# Patient Record
Sex: Female | Born: 1935
Health system: Southern US, Community
[De-identification: ages and names within clinical notes are randomized; demographics above are authoritative.]

## PROBLEM LIST (undated history)

## (undated) ENCOUNTER — Emergency Department (HOSPITAL_BASED_OUTPATIENT_CLINIC_OR_DEPARTMENT_OTHER): Admission: EM | Payer: Medicare Other | Source: Home / Self Care

## (undated) DIAGNOSIS — R079 Chest pain, unspecified: Secondary | ICD-10-CM

## (undated) DIAGNOSIS — C50919 Malignant neoplasm of unspecified site of unspecified female breast: Secondary | ICD-10-CM

## (undated) DIAGNOSIS — M545 Low back pain, unspecified: Secondary | ICD-10-CM

## (undated) DIAGNOSIS — I219 Acute myocardial infarction, unspecified: Secondary | ICD-10-CM

## (undated) DIAGNOSIS — I358 Other nonrheumatic aortic valve disorders: Secondary | ICD-10-CM

## (undated) DIAGNOSIS — K219 Gastro-esophageal reflux disease without esophagitis: Secondary | ICD-10-CM

## (undated) DIAGNOSIS — E119 Type 2 diabetes mellitus without complications: Secondary | ICD-10-CM

## (undated) DIAGNOSIS — K5792 Diverticulitis of intestine, part unspecified, without perforation or abscess without bleeding: Secondary | ICD-10-CM

## (undated) DIAGNOSIS — N390 Urinary tract infection, site not specified: Secondary | ICD-10-CM

## (undated) DIAGNOSIS — D649 Anemia, unspecified: Secondary | ICD-10-CM

## (undated) DIAGNOSIS — R52 Pain, unspecified: Secondary | ICD-10-CM

## (undated) DIAGNOSIS — R0600 Dyspnea, unspecified: Secondary | ICD-10-CM

## (undated) DIAGNOSIS — N189 Chronic kidney disease, unspecified: Secondary | ICD-10-CM

## (undated) DIAGNOSIS — R0602 Shortness of breath: Secondary | ICD-10-CM

## (undated) DIAGNOSIS — I350 Nonrheumatic aortic (valve) stenosis: Secondary | ICD-10-CM

## (undated) DIAGNOSIS — I2510 Atherosclerotic heart disease of native coronary artery without angina pectoris: Secondary | ICD-10-CM

## (undated) DIAGNOSIS — M1990 Unspecified osteoarthritis, unspecified site: Secondary | ICD-10-CM

## (undated) DIAGNOSIS — F419 Anxiety disorder, unspecified: Secondary | ICD-10-CM

## (undated) DIAGNOSIS — G4700 Insomnia, unspecified: Secondary | ICD-10-CM

## (undated) DIAGNOSIS — I10 Essential (primary) hypertension: Secondary | ICD-10-CM

## (undated) DIAGNOSIS — H3530 Unspecified macular degeneration: Secondary | ICD-10-CM

## (undated) DIAGNOSIS — E7800 Pure hypercholesterolemia, unspecified: Secondary | ICD-10-CM

## (undated) DIAGNOSIS — L900 Lichen sclerosus et atrophicus: Secondary | ICD-10-CM

## (undated) HISTORY — DX: Shortness of breath: R06.02

## (undated) HISTORY — DX: Essential (primary) hypertension: I10

## (undated) HISTORY — DX: Unspecified osteoarthritis, unspecified site: M19.90

## (undated) HISTORY — DX: Low back pain: M54.5

## (undated) HISTORY — PX: APPENDECTOMY: SHX54

## (undated) HISTORY — DX: Low back pain, unspecified: M54.50

## (undated) HISTORY — DX: Atherosclerotic heart disease of native coronary artery without angina pectoris: I25.10

## (undated) HISTORY — DX: Acute myocardial infarction, unspecified: I21.9

## (undated) HISTORY — DX: Nonrheumatic aortic (valve) stenosis: I35.0

## (undated) HISTORY — DX: Pain, unspecified: R52

## (undated) HISTORY — DX: Other nonrheumatic aortic valve disorders: I35.8

## (undated) HISTORY — PX: HAND SURGERY: SHX662

## (undated) HISTORY — DX: Malignant neoplasm of unspecified site of unspecified female breast: C50.919

## (undated) HISTORY — DX: Dyspnea, unspecified: R06.00

## (undated) HISTORY — DX: Pure hypercholesterolemia, unspecified: E78.00

## (undated) HISTORY — DX: Type 2 diabetes mellitus without complications: E11.9

## (undated) HISTORY — DX: Chest pain, unspecified: R07.9

## (undated) HISTORY — DX: Lichen sclerosus et atrophicus: L90.0

## (undated) HISTORY — PX: ABDOMINAL HYSTERECTOMY: SHX81

---

## 1967-12-12 HISTORY — PX: VESICOVAGINAL FISTULA CLOSURE W/ TAH: SUR271

## 1997-12-11 HISTORY — PX: MASTECTOMY: SHX3

## 1998-12-11 DIAGNOSIS — I219 Acute myocardial infarction, unspecified: Secondary | ICD-10-CM

## 1998-12-11 HISTORY — DX: Acute myocardial infarction, unspecified: I21.9

## 2001-09-02 ENCOUNTER — Emergency Department (HOSPITAL_COMMUNITY): Admission: EM | Admit: 2001-09-02 | Discharge: 2001-09-02 | Payer: Self-pay | Admitting: Emergency Medicine

## 2007-12-08 ENCOUNTER — Emergency Department (HOSPITAL_COMMUNITY): Admission: EM | Admit: 2007-12-08 | Discharge: 2007-12-09 | Payer: Self-pay | Admitting: Emergency Medicine

## 2008-01-29 ENCOUNTER — Other Ambulatory Visit: Admission: RE | Admit: 2008-01-29 | Discharge: 2008-01-29 | Payer: Self-pay | Admitting: Obstetrics and Gynecology

## 2008-09-08 ENCOUNTER — Encounter: Admission: RE | Admit: 2008-09-08 | Discharge: 2008-09-08 | Payer: Self-pay | Admitting: Internal Medicine

## 2009-04-02 ENCOUNTER — Encounter: Admission: RE | Admit: 2009-04-02 | Discharge: 2009-04-02 | Payer: Self-pay | Admitting: Internal Medicine

## 2009-05-27 ENCOUNTER — Encounter: Admission: RE | Admit: 2009-05-27 | Discharge: 2009-05-27 | Payer: Self-pay | Admitting: Orthopedic Surgery

## 2009-06-02 ENCOUNTER — Ambulatory Visit (HOSPITAL_BASED_OUTPATIENT_CLINIC_OR_DEPARTMENT_OTHER): Admission: RE | Admit: 2009-06-02 | Discharge: 2009-06-02 | Payer: Self-pay | Admitting: Orthopedic Surgery

## 2009-09-09 ENCOUNTER — Encounter: Admission: RE | Admit: 2009-09-09 | Discharge: 2009-09-09 | Payer: Self-pay | Admitting: Obstetrics and Gynecology

## 2009-11-10 HISTORY — PX: REPLACEMENT TOTAL KNEE: SUR1224

## 2009-12-07 ENCOUNTER — Inpatient Hospital Stay (HOSPITAL_COMMUNITY): Admission: RE | Admit: 2009-12-07 | Discharge: 2009-12-10 | Payer: Self-pay | Admitting: Orthopedic Surgery

## 2010-05-03 ENCOUNTER — Ambulatory Visit (HOSPITAL_COMMUNITY): Admission: RE | Admit: 2010-05-03 | Discharge: 2010-05-03 | Payer: Self-pay | Admitting: Orthopedic Surgery

## 2010-06-06 ENCOUNTER — Encounter: Admission: RE | Admit: 2010-06-06 | Discharge: 2010-06-06 | Payer: Self-pay | Admitting: Internal Medicine

## 2010-09-15 ENCOUNTER — Encounter: Admission: RE | Admit: 2010-09-15 | Discharge: 2010-09-15 | Payer: Self-pay | Admitting: Obstetrics and Gynecology

## 2010-11-04 ENCOUNTER — Encounter: Admission: RE | Admit: 2010-11-04 | Discharge: 2010-11-04 | Payer: Self-pay | Admitting: Obstetrics and Gynecology

## 2011-03-13 LAB — TYPE AND SCREEN
ABO/RH(D): O POS
Antibody Screen: NEGATIVE

## 2011-03-13 LAB — HEMOGLOBIN AND HEMATOCRIT, BLOOD
HCT: 23.4 % — ABNORMAL LOW (ref 36.0–46.0)
HCT: 27.5 % — ABNORMAL LOW (ref 36.0–46.0)
Hemoglobin: 8.6 g/dL — ABNORMAL LOW (ref 12.0–15.0)

## 2011-03-13 LAB — GLUCOSE, CAPILLARY
Glucose-Capillary: 118 mg/dL — ABNORMAL HIGH (ref 70–99)
Glucose-Capillary: 127 mg/dL — ABNORMAL HIGH (ref 70–99)
Glucose-Capillary: 149 mg/dL — ABNORMAL HIGH (ref 70–99)
Glucose-Capillary: 150 mg/dL — ABNORMAL HIGH (ref 70–99)
Glucose-Capillary: 152 mg/dL — ABNORMAL HIGH (ref 70–99)
Glucose-Capillary: 156 mg/dL — ABNORMAL HIGH (ref 70–99)
Glucose-Capillary: 158 mg/dL — ABNORMAL HIGH (ref 70–99)
Glucose-Capillary: 205 mg/dL — ABNORMAL HIGH (ref 70–99)

## 2011-03-13 LAB — PROTIME-INR
INR: 1.01 (ref 0.00–1.49)
INR: 1.07 (ref 0.00–1.49)
INR: 1.72 — ABNORMAL HIGH (ref 0.00–1.49)
Prothrombin Time: 13.2 seconds (ref 11.6–15.2)
Prothrombin Time: 20 seconds — ABNORMAL HIGH (ref 11.6–15.2)

## 2011-03-13 LAB — COMPREHENSIVE METABOLIC PANEL
Albumin: 4.4 g/dL (ref 3.5–5.2)
CO2: 28 mEq/L (ref 19–32)
Calcium: 10.2 mg/dL (ref 8.4–10.5)
Glucose, Bld: 109 mg/dL — ABNORMAL HIGH (ref 70–99)
Sodium: 137 mEq/L (ref 135–145)
Total Protein: 7.6 g/dL (ref 6.0–8.3)

## 2011-03-13 LAB — URINALYSIS, ROUTINE W REFLEX MICROSCOPIC
Bilirubin Urine: NEGATIVE
Ketones, ur: NEGATIVE mg/dL
Nitrite: NEGATIVE
Specific Gravity, Urine: 1.022 (ref 1.005–1.030)
Urobilinogen, UA: 0.2 mg/dL (ref 0.0–1.0)
pH: 7 (ref 5.0–8.0)

## 2011-03-13 LAB — DIFFERENTIAL
Basophils Relative: 0 % (ref 0–1)
Eosinophils Absolute: 0.2 10*3/uL (ref 0.0–0.7)
Monocytes Absolute: 0.5 10*3/uL (ref 0.1–1.0)
Neutrophils Relative %: 60 % (ref 43–77)

## 2011-03-13 LAB — CBC
Hemoglobin: 14.3 g/dL (ref 12.0–15.0)
MCV: 92.9 fL (ref 78.0–100.0)
Platelets: 248 10*3/uL (ref 150–400)
RDW: 12.7 % (ref 11.5–15.5)
WBC: 7.7 10*3/uL (ref 4.0–10.5)

## 2011-03-13 LAB — PREPARE RBC (CROSSMATCH)

## 2011-03-20 LAB — BASIC METABOLIC PANEL
GFR calc non Af Amer: >60 mL/min (ref >60)
Potassium: 4.5 mEq/L (ref 3.5–5.1)
Sodium: 141 mEq/L (ref 135–145)

## 2011-03-20 LAB — GLUCOSE, CAPILLARY: Glucose-Capillary: 106 mg/dL — ABNORMAL HIGH (ref 70–99)

## 2011-03-20 LAB — POCT HEMOGLOBIN-HEMACUE: Hemoglobin: 13.2 g/dL (ref 12.0–15.0)

## 2011-04-25 NOTE — Op Note (Signed)
Barbara Russell, Barbara Russell              ACCOUNT NO.:  [id number]   MEDICAL RECORD NO.:  [id number]          PATIENT TYPE:  AMB   LOCATION:  [facility]                         FACILITY:  [facility]   PHYSICIAN:  Georges Lynch. Gioffre, M.D.DATE OF BIRTH:  Feb 24, 1930   DATE OF PROCEDURE:  06/02/2009  DATE OF DISCHARGE:                               OPERATIVE REPORT   SURGEON:  Georges Lynch. Darrelyn Hillock, M.D.   ASSISTANT:  Nurse.   PREOPERATIVE DIAGNOSES:  1. Severe degenerative arthritis with a valgus deformity of the right      knee.  2. Torn medial meniscus, right knee.  3. Torn lateral meniscus, right knee.   POSTOPERATIVE DIAGNOSES:  1. Severe degenerative arthritis with a valgus deformity of the right      knee.  2. Torn medial meniscus, right knee.  3. Torn lateral meniscus, right knee.   OPERATION:  1. Diagnostic arthroscopy, right knee.  2. Medial meniscectomy, right knee.  3. Lateral meniscectomy, right knee.  4. Abrasion chondroplasty medial femoral condyle, right knee.  5. Synovectomy suprapatellar pouch, right knee.   PROCEDURE:  Under general anesthesia a routine orthopedic prep and  draping of the right lower extremity was carried out.  She had 500 mg of  vancomycin IV.  At this time a small punctate incision was made in the  suprapatellar pouch, inflow cannula was inserted, knee was distended  with saline.  Another small punctate incision was made in the  anterolateral joint.  The arthroscope was entered from lateral approach  and a complete diagnostic arthroscopy was carried out.  Following that,  I went up into the suprapatellar pouch.  She had a severe chronic  synovitis.  I introduced the ArthroCare and did a synovectomy.  I then  went down in the lateral joint.  She had severe arthritic changes in the  lateral joint plus a severe complex tear of the entire lateral meniscus.  I did a partial lateral meniscectomy at this time.  Cruciates were  intact and over the medial joint did an  abrasion chondroplasty with the  shaver suction device to the medial femoral condyle.  She also had a  complex tear of the medial meniscus.  I introduced a shaver suction  device followed by the ArthroCare and did a partial medial meniscectomy.  I thoroughly irrigated out the knee, reinspected the knee.  There were  no other abnormalities noted.  The bottom line is she had severe  arthritic changes in the right knee.  She may need a total knee  arthroplasty in the future.  Note she had 500 mg of vancomycin IV.  Postop I injected 30 mL 0.5% Marcaine and epinephrine into the knee  joint.  A sterile Neosporin bundle dressing was applied.   POSTOP CARE:  1. She is going to be on aspirin 325 mg b.i.d. starting today and for      2 weeks.  2. She will be on Darvocet N 100 one every 4 hours p.r.n. for moderate      pain.  3. She will be on Dilaudid 2 mg q.6 h p.r.n. for severe  pain.  4. She will remove all her dressing as I explained Friday morning and      apply Band-Aids the each of the areas.  5. She will ambulate with a walker full weightbearing.  6. She will call the office and let me see her in 12-14 days or prior      to that if there is a problem.           ______________________________  Georges Lynch Darrelyn Hillock, M.D.     RAG/MEDQ  D:  06/02/2009  T:  06/02/2009  Job:  [id number]

## 2011-07-13 ENCOUNTER — Other Ambulatory Visit: Payer: Self-pay | Admitting: Obstetrics and Gynecology

## 2011-07-13 DIAGNOSIS — Z9011 Acquired absence of right breast and nipple: Secondary | ICD-10-CM

## 2011-09-18 ENCOUNTER — Ambulatory Visit
Admission: RE | Admit: 2011-09-18 | Discharge: 2011-09-18 | Disposition: A | Discharge status: Home / Self Care | Payer: Self-pay | Source: Ambulatory Visit | Attending: Obstetrics and Gynecology | Admitting: Obstetrics and Gynecology

## 2011-09-18 DIAGNOSIS — Z9011 Acquired absence of right breast and nipple: Secondary | ICD-10-CM

## 2011-12-13 DIAGNOSIS — I10 Essential (primary) hypertension: Secondary | ICD-10-CM | POA: Diagnosis not present

## 2011-12-13 DIAGNOSIS — E785 Hyperlipidemia, unspecified: Secondary | ICD-10-CM | POA: Diagnosis not present

## 2011-12-21 DIAGNOSIS — I359 Nonrheumatic aortic valve disorder, unspecified: Secondary | ICD-10-CM | POA: Diagnosis not present

## 2011-12-21 DIAGNOSIS — R0602 Shortness of breath: Secondary | ICD-10-CM | POA: Diagnosis not present

## 2011-12-21 DIAGNOSIS — R079 Chest pain, unspecified: Secondary | ICD-10-CM | POA: Diagnosis not present

## 2011-12-27 DIAGNOSIS — R079 Chest pain, unspecified: Secondary | ICD-10-CM | POA: Diagnosis not present

## 2011-12-27 DIAGNOSIS — R0602 Shortness of breath: Secondary | ICD-10-CM | POA: Diagnosis not present

## 2011-12-27 DIAGNOSIS — I359 Nonrheumatic aortic valve disorder, unspecified: Secondary | ICD-10-CM | POA: Diagnosis not present

## 2012-01-09 DIAGNOSIS — M1710 Unilateral primary osteoarthritis, unspecified knee: Secondary | ICD-10-CM | POA: Diagnosis not present

## 2012-01-12 DIAGNOSIS — M1710 Unilateral primary osteoarthritis, unspecified knee: Secondary | ICD-10-CM | POA: Diagnosis not present

## 2012-01-30 DIAGNOSIS — R350 Frequency of micturition: Secondary | ICD-10-CM | POA: Diagnosis not present

## 2012-01-30 DIAGNOSIS — R0602 Shortness of breath: Secondary | ICD-10-CM | POA: Diagnosis not present

## 2012-02-13 DIAGNOSIS — I10 Essential (primary) hypertension: Secondary | ICD-10-CM | POA: Diagnosis not present

## 2012-02-13 DIAGNOSIS — R0602 Shortness of breath: Secondary | ICD-10-CM | POA: Diagnosis not present

## 2012-04-22 DIAGNOSIS — M6530 Trigger finger, unspecified finger: Secondary | ICD-10-CM | POA: Diagnosis not present

## 2012-05-01 DIAGNOSIS — M6530 Trigger finger, unspecified finger: Secondary | ICD-10-CM | POA: Diagnosis not present

## 2012-05-03 DIAGNOSIS — M6530 Trigger finger, unspecified finger: Secondary | ICD-10-CM | POA: Diagnosis not present

## 2012-05-16 DIAGNOSIS — E785 Hyperlipidemia, unspecified: Secondary | ICD-10-CM | POA: Diagnosis not present

## 2012-05-16 DIAGNOSIS — E119 Type 2 diabetes mellitus without complications: Secondary | ICD-10-CM | POA: Diagnosis not present

## 2012-05-16 DIAGNOSIS — I2510 Atherosclerotic heart disease of native coronary artery without angina pectoris: Secondary | ICD-10-CM | POA: Diagnosis not present

## 2012-05-16 DIAGNOSIS — N390 Urinary tract infection, site not specified: Secondary | ICD-10-CM | POA: Diagnosis not present

## 2012-05-16 DIAGNOSIS — M899 Disorder of bone, unspecified: Secondary | ICD-10-CM | POA: Diagnosis not present

## 2012-05-16 DIAGNOSIS — I519 Heart disease, unspecified: Secondary | ICD-10-CM | POA: Diagnosis not present

## 2012-05-16 DIAGNOSIS — Z0000 Encounter for general adult medical examination without abnormal findings: Secondary | ICD-10-CM | POA: Diagnosis not present

## 2012-05-16 DIAGNOSIS — I10 Essential (primary) hypertension: Secondary | ICD-10-CM | POA: Diagnosis not present

## 2012-07-18 DIAGNOSIS — R197 Diarrhea, unspecified: Secondary | ICD-10-CM | POA: Diagnosis not present

## 2012-07-18 DIAGNOSIS — R05 Cough: Secondary | ICD-10-CM | POA: Diagnosis not present

## 2012-07-18 DIAGNOSIS — K219 Gastro-esophageal reflux disease without esophagitis: Secondary | ICD-10-CM | POA: Diagnosis not present

## 2012-07-18 DIAGNOSIS — R109 Unspecified abdominal pain: Secondary | ICD-10-CM | POA: Diagnosis not present

## 2012-08-17 DIAGNOSIS — M6580 Other synovitis and tenosynovitis, unspecified site: Secondary | ICD-10-CM | POA: Diagnosis not present

## 2012-09-10 ENCOUNTER — Other Ambulatory Visit: Payer: Self-pay | Admitting: Obstetrics and Gynecology

## 2012-09-10 DIAGNOSIS — Z9011 Acquired absence of right breast and nipple: Secondary | ICD-10-CM

## 2012-09-10 DIAGNOSIS — Z1231 Encounter for screening mammogram for malignant neoplasm of breast: Secondary | ICD-10-CM

## 2012-09-30 DIAGNOSIS — Z23 Encounter for immunization: Secondary | ICD-10-CM | POA: Diagnosis not present

## 2012-10-15 ENCOUNTER — Ambulatory Visit
Admission: RE | Admit: 2012-10-15 | Discharge: 2012-10-15 | Disposition: A | Discharge status: Home / Self Care | Payer: Federal, State, Local not specified - PPO | Source: Ambulatory Visit | Attending: Obstetrics and Gynecology | Admitting: Obstetrics and Gynecology

## 2012-10-15 DIAGNOSIS — Z1231 Encounter for screening mammogram for malignant neoplasm of breast: Secondary | ICD-10-CM | POA: Diagnosis not present

## 2012-10-15 DIAGNOSIS — Z9011 Acquired absence of right breast and nipple: Secondary | ICD-10-CM

## 2012-11-13 DIAGNOSIS — N182 Chronic kidney disease, stage 2 (mild): Secondary | ICD-10-CM | POA: Diagnosis not present

## 2012-11-13 DIAGNOSIS — I10 Essential (primary) hypertension: Secondary | ICD-10-CM | POA: Diagnosis not present

## 2012-11-13 DIAGNOSIS — E1129 Type 2 diabetes mellitus with other diabetic kidney complication: Secondary | ICD-10-CM | POA: Diagnosis not present

## 2012-12-02 DIAGNOSIS — R1032 Left lower quadrant pain: Secondary | ICD-10-CM | POA: Diagnosis not present

## 2012-12-02 DIAGNOSIS — Z01419 Encounter for gynecological examination (general) (routine) without abnormal findings: Secondary | ICD-10-CM | POA: Diagnosis not present

## 2013-04-01 DIAGNOSIS — R197 Diarrhea, unspecified: Secondary | ICD-10-CM | POA: Diagnosis not present

## 2013-04-01 DIAGNOSIS — R141 Gas pain: Secondary | ICD-10-CM | POA: Diagnosis not present

## 2013-04-09 DIAGNOSIS — H26499 Other secondary cataract, unspecified eye: Secondary | ICD-10-CM | POA: Diagnosis not present

## 2013-04-09 DIAGNOSIS — H04129 Dry eye syndrome of unspecified lacrimal gland: Secondary | ICD-10-CM | POA: Diagnosis not present

## 2013-04-09 DIAGNOSIS — E119 Type 2 diabetes mellitus without complications: Secondary | ICD-10-CM | POA: Diagnosis not present

## 2013-04-10 DIAGNOSIS — I10 Essential (primary) hypertension: Secondary | ICD-10-CM | POA: Diagnosis not present

## 2013-04-10 DIAGNOSIS — R197 Diarrhea, unspecified: Secondary | ICD-10-CM | POA: Diagnosis not present

## 2013-04-10 DIAGNOSIS — R259 Unspecified abnormal involuntary movements: Secondary | ICD-10-CM | POA: Diagnosis not present

## 2013-05-21 DIAGNOSIS — Z0000 Encounter for general adult medical examination without abnormal findings: Secondary | ICD-10-CM | POA: Diagnosis not present

## 2013-05-21 DIAGNOSIS — N390 Urinary tract infection, site not specified: Secondary | ICD-10-CM | POA: Diagnosis not present

## 2013-05-21 DIAGNOSIS — M899 Disorder of bone, unspecified: Secondary | ICD-10-CM | POA: Diagnosis not present

## 2013-05-21 DIAGNOSIS — E119 Type 2 diabetes mellitus without complications: Secondary | ICD-10-CM | POA: Diagnosis not present

## 2013-05-21 DIAGNOSIS — I10 Essential (primary) hypertension: Secondary | ICD-10-CM | POA: Diagnosis not present

## 2013-05-21 DIAGNOSIS — E785 Hyperlipidemia, unspecified: Secondary | ICD-10-CM | POA: Diagnosis not present

## 2013-05-21 DIAGNOSIS — I519 Heart disease, unspecified: Secondary | ICD-10-CM | POA: Diagnosis not present

## 2013-05-28 DIAGNOSIS — N390 Urinary tract infection, site not specified: Secondary | ICD-10-CM | POA: Diagnosis not present

## 2013-06-24 DIAGNOSIS — I2510 Atherosclerotic heart disease of native coronary artery without angina pectoris: Secondary | ICD-10-CM | POA: Diagnosis not present

## 2013-06-24 DIAGNOSIS — R079 Chest pain, unspecified: Secondary | ICD-10-CM | POA: Diagnosis not present

## 2013-06-26 DIAGNOSIS — I10 Essential (primary) hypertension: Secondary | ICD-10-CM | POA: Diagnosis not present

## 2013-06-26 DIAGNOSIS — R079 Chest pain, unspecified: Secondary | ICD-10-CM | POA: Diagnosis not present

## 2013-06-26 DIAGNOSIS — E785 Hyperlipidemia, unspecified: Secondary | ICD-10-CM | POA: Diagnosis not present

## 2013-06-26 DIAGNOSIS — I2510 Atherosclerotic heart disease of native coronary artery without angina pectoris: Secondary | ICD-10-CM | POA: Diagnosis not present

## 2013-07-03 DIAGNOSIS — R079 Chest pain, unspecified: Secondary | ICD-10-CM | POA: Diagnosis not present

## 2013-07-03 DIAGNOSIS — I10 Essential (primary) hypertension: Secondary | ICD-10-CM | POA: Diagnosis not present

## 2013-07-03 DIAGNOSIS — E785 Hyperlipidemia, unspecified: Secondary | ICD-10-CM | POA: Diagnosis not present

## 2013-07-03 DIAGNOSIS — I2510 Atherosclerotic heart disease of native coronary artery without angina pectoris: Secondary | ICD-10-CM | POA: Diagnosis not present

## 2013-07-11 DIAGNOSIS — M79609 Pain in unspecified limb: Secondary | ICD-10-CM | POA: Diagnosis not present

## 2013-08-18 ENCOUNTER — Other Ambulatory Visit: Payer: Self-pay | Admitting: Internal Medicine

## 2013-08-18 DIAGNOSIS — R0602 Shortness of breath: Secondary | ICD-10-CM | POA: Diagnosis not present

## 2013-08-18 DIAGNOSIS — R1310 Dysphagia, unspecified: Secondary | ICD-10-CM | POA: Diagnosis not present

## 2013-08-28 ENCOUNTER — Ambulatory Visit
Admission: RE | Admit: 2013-08-28 | Discharge: 2013-08-28 | Disposition: A | Discharge status: Home / Self Care | Payer: Medicare Other | Source: Ambulatory Visit | Attending: Internal Medicine | Admitting: Internal Medicine

## 2013-08-28 DIAGNOSIS — R1310 Dysphagia, unspecified: Secondary | ICD-10-CM

## 2013-08-28 DIAGNOSIS — K228 Other specified diseases of esophagus: Secondary | ICD-10-CM | POA: Diagnosis not present

## 2013-09-07 DIAGNOSIS — Z23 Encounter for immunization: Secondary | ICD-10-CM | POA: Diagnosis not present

## 2013-10-09 ENCOUNTER — Other Ambulatory Visit: Payer: Self-pay | Admitting: Nurse Practitioner

## 2013-10-09 ENCOUNTER — Ambulatory Visit
Admission: RE | Admit: 2013-10-09 | Discharge: 2013-10-09 | Disposition: A | Discharge status: Home / Self Care | Payer: Medicare Other | Source: Ambulatory Visit | Attending: Nurse Practitioner | Admitting: Nurse Practitioner

## 2013-10-09 DIAGNOSIS — R509 Fever, unspecified: Secondary | ICD-10-CM

## 2013-10-09 DIAGNOSIS — J189 Pneumonia, unspecified organism: Secondary | ICD-10-CM | POA: Diagnosis not present

## 2013-10-09 DIAGNOSIS — J398 Other specified diseases of upper respiratory tract: Secondary | ICD-10-CM | POA: Diagnosis not present

## 2013-10-13 ENCOUNTER — Encounter: Payer: Self-pay | Admitting: Cardiology

## 2013-10-17 DIAGNOSIS — J189 Pneumonia, unspecified organism: Secondary | ICD-10-CM | POA: Diagnosis not present

## 2013-10-17 DIAGNOSIS — R509 Fever, unspecified: Secondary | ICD-10-CM | POA: Diagnosis not present

## 2013-10-20 ENCOUNTER — Other Ambulatory Visit: Payer: Self-pay

## 2013-10-20 DIAGNOSIS — Z1231 Encounter for screening mammogram for malignant neoplasm of breast: Secondary | ICD-10-CM

## 2013-10-20 DIAGNOSIS — Z9011 Acquired absence of right breast and nipple: Secondary | ICD-10-CM

## 2013-11-26 ENCOUNTER — Ambulatory Visit: Payer: Medicare Other

## 2013-12-02 DIAGNOSIS — N390 Urinary tract infection, site not specified: Secondary | ICD-10-CM | POA: Diagnosis not present

## 2013-12-16 DIAGNOSIS — N390 Urinary tract infection, site not specified: Secondary | ICD-10-CM | POA: Diagnosis not present

## 2013-12-18 DIAGNOSIS — M949 Disorder of cartilage, unspecified: Secondary | ICD-10-CM | POA: Diagnosis not present

## 2013-12-18 DIAGNOSIS — M899 Disorder of bone, unspecified: Secondary | ICD-10-CM | POA: Diagnosis not present

## 2013-12-29 ENCOUNTER — Ambulatory Visit: Payer: Medicare Other

## 2013-12-29 ENCOUNTER — Ambulatory Visit: Payer: Medicare Other | Admitting: Cardiology

## 2014-01-01 ENCOUNTER — Other Ambulatory Visit: Payer: Self-pay

## 2014-01-01 DIAGNOSIS — Z1231 Encounter for screening mammogram for malignant neoplasm of breast: Secondary | ICD-10-CM

## 2014-01-01 DIAGNOSIS — Z9011 Acquired absence of right breast and nipple: Secondary | ICD-10-CM

## 2014-01-06 ENCOUNTER — Ambulatory Visit (INDEPENDENT_AMBULATORY_CARE_PROVIDER_SITE_OTHER): Payer: Medicare Other | Admitting: Cardiology

## 2014-01-06 ENCOUNTER — Encounter: Payer: Self-pay | Admitting: Cardiology

## 2014-01-06 VITALS — BP 120/62 | HR 75 | Ht 63.0 in | Wt 146.0 lb

## 2014-01-06 DIAGNOSIS — I700 Atherosclerosis of aorta: Secondary | ICD-10-CM

## 2014-01-06 DIAGNOSIS — IMO0001 Reserved for inherently not codable concepts without codable children: Secondary | ICD-10-CM

## 2014-01-06 DIAGNOSIS — E119 Type 2 diabetes mellitus without complications: Secondary | ICD-10-CM | POA: Diagnosis not present

## 2014-01-06 DIAGNOSIS — I059 Rheumatic mitral valve disease, unspecified: Secondary | ICD-10-CM

## 2014-01-06 DIAGNOSIS — I208 Other forms of angina pectoris: Secondary | ICD-10-CM

## 2014-01-06 DIAGNOSIS — I252 Old myocardial infarction: Secondary | ICD-10-CM

## 2014-01-06 DIAGNOSIS — I2089 Other forms of angina pectoris: Secondary | ICD-10-CM

## 2014-01-06 DIAGNOSIS — I209 Angina pectoris, unspecified: Secondary | ICD-10-CM | POA: Diagnosis not present

## 2014-01-06 DIAGNOSIS — R011 Cardiac murmur, unspecified: Secondary | ICD-10-CM | POA: Diagnosis not present

## 2014-01-06 DIAGNOSIS — I2510 Atherosclerotic heart disease of native coronary artery without angina pectoris: Secondary | ICD-10-CM

## 2014-01-06 DIAGNOSIS — I340 Nonrheumatic mitral (valve) insufficiency: Secondary | ICD-10-CM

## 2014-01-06 MED ORDER — ISOSORBIDE MONONITRATE ER 30 MG PO TB24: 30.0000 mg | ORAL_TABLET | Freq: Every day | ORAL | Status: DC

## 2014-01-06 NOTE — Patient Instructions (Addendum)
Your physician has recommended you make the following change in your medication:   1. Start Imdur 30 mg once daily.  Your physician has requested that you have an echocardiogram. Echocardiography is a painless test that uses sound waves to create images of your heart. It provides your doctor with information about the size and shape of your heart and how well your heart's chambers and valves are working. This procedure takes approximately one hour. There are no restrictions for this procedure.  Your physician recommends that you schedule a follow-up appointment in: 3 months with Dr. Marlou Porch.

## 2014-01-06 NOTE — Progress Notes (Signed)
[place]. [address] [place], [place]  [address] Phone: [phone] Fax:  [phone]  Date:  01/06/2014   ID:  Barbara Russell, DOB [birth year], MRN [id number]  PCP:  Kandice Hams, MD   History of Present Illness: Barbara Russell is a 80 y.o. female who has a history of hypertension, diabetes, normal stress test in 06/2013 low risk with remote history of CAD documented in the past with myocardial infarction in the year 2000. She has been intolerant of statins. I saw her last on 12/21/11 secondary to mostly dyspnea. She also had a several year history of pain/shortness of breath radiating to her neck that was described as moderate severity. At that time she did not wish to take Lasix. She was worried about worsening leg cramping. An echocardiogram demonstrated normal ejection fraction with mild LVH and mild diastolic dysfunction. I performed a nuclear stress test at that time which was overall low risk with no evidence of ischemia  Diarrhea for years, lactose intolerant. Main complaint. No hiatal hernia.  Saw my mother at wedding in [place].   If goes up hill has pain that goes up to neck.Chasing dog feels same thing. Knitting same thing.   Wt Readings from Last 3 Encounters:  01/06/14 146 lb (66.225 kg)     Past Medical History  Diagnosis Date  . Hypertension   . Hypercholesteremia   . Diabetes mellitus without complication   . Coronary artery disease     Remote Hx of CAD, normal stress test 2009  . Breast cancer     Hx of BC, s/p mastectomy on right 1999, s/p tamoxifen X1 year  . Lichen sclerosus   . Osteoarthrosis, unspecified whether generalized or localized, unspecified site   . Low back pain   . Myocardial infarction 2000  . SOB (shortness of breath)   . Pain     Several year Hx of pain/ SOB radiating to her neck  . Chest pain     EC Stress test 12/21/11 Low risk, no ischemia, normal EF  . Aortic heart murmur     aortic sclerosis w/out and significant  stenosis  . Dyspnea     Past Surgical History  Procedure Laterality Date  . Mastectomy Right 1999  . Replacement total knee Right 11/2009  . Vesicovaginal fistula closure w/ tah  1969  . Appendectomy    . Hand surgery Bilateral     CTS and trigger finger on right and left    Current Outpatient Prescriptions  Medication Sig Dispense Refill  . aspirin 81 MG tablet Take 81 mg by mouth daily.      . cetirizine (ZYRTEC) 10 MG tablet Take 10 mg by mouth daily.      . cholecalciferol (VITAMIN D) 1000 UNITS tablet Take 2,000 Units by mouth daily.       . famotidine (PEPCID) 10 MG tablet Take 10 mg by mouth as directed.      . furosemide (LASIX) 20 MG tablet Take 10 mg by mouth daily.      Marland Kitchen gabapentin (NEURONTIN) 300 MG capsule Take 300 mg by mouth 2 (two) times daily.      Marland Kitchen loperamide (IMODIUM) 2 MG capsule Take 2 mg by mouth as needed for diarrhea or loose stools.      . metFORMIN (GLUCOPHAGE) 500 MG tablet Take 500 mg by mouth 2 (two) times daily with a meal.       . nitroGLYCERIN (NITROSTAT) 0.4 MG  SL tablet Place 0.4 mg under the tongue every 5 (five) minutes as needed for chest pain.      Marland Kitchen olmesartan (BENICAR) 20 MG tablet Take 20 mg by mouth daily.      . ondansetron (ZOFRAN) 4 MG tablet Take 4 mg by mouth every 8 (eight) hours. As directed      . Potassium (POTASSIMIN PO) Take 1 tablet by mouth at bedtime.      Marland Kitchen zolpidem (AMBIEN) 5 MG tablet Take 2.5 mg by mouth at bedtime.       No current facility-administered medications for this visit.    Allergies:    Allergies  Allergen Reactions  . Benadryl [Diphenhydramine Hcl (Sleep)]   . Ciprocinonide [Fluocinolone]     CIPRO  . Lactose Intolerance (Gi)     DIARRHEA  . Macrobid [employer] Macro]   . Macrodantin [Nitrofurantoin Macrocrystal]   . Naproxen   . Penicillins   . Prednisone   . Quinine Sulfate [Quinine]   . Sulfa Antibiotics     Social History:  The patient  reports that she has never smoked. She  does not have any smokeless tobacco history on file. She reports that she does not drink alcohol or use illicit drugs.   ROS:  Please see the history of present illness.   Denies any fevers, chills, orthopnea, PND    PHYSICAL EXAM: VS:  BP 120/62  Pulse 75  Ht 5\' 3"  (1.6 m)  Wt 146 lb (66.225 kg)  BMI 25.87 kg/m2 Well nourished, well developed, in no acute distress HEENT: normal Neck: no JVD  Cardiac:  normal S1, S2; RRR; 2/6 S murmurRUSB Lungs:  clear to auscultation bilaterally, no wheezing, rhonchi or rales Abd: soft, nontender, no hepatomegaly Ext: no edema Skin: warm and dry Neuro: no focal abnormalities noted  EKG:  Sinus rhythm rate 75 with old septal infarct pattern, poor R wave progression      ASSESSMENT AND PLAN:  1. Old myocardial infarction-year 2000. She's having occasional symptom of chest discomfort with radiation to behind the ears that sometimes occurs when chasing a dog, moving the trash can but has happened at rest. Previous stress tests have been reassuring.. Last stress test in 06/2013 was reassuring. Low risk. I will try isosorbide 30 mg daily to see if this helps. Discussed possible side effect of headache. 2. Angina-as described above. Could be GI related. 3. Coronary artery disease- remote history in past. Documented in medical record. Stable. 4. Diabetes-per primary. 5. Heart murmur-prior mitral annular calcification, mitral regurgitation and aortic valve sclerosis but no stenosis in 2012. With her increasing symptoms, I will check an echocardiogram once again to ensure that she does not have any advancement of her aortic valve disorder. 6. We'll see back in 3 months.  Signed, Candee Furbish, MD [facility]  01/06/2014 10:37 AM

## 2014-01-08 DIAGNOSIS — Z124 Encounter for screening for malignant neoplasm of cervix: Secondary | ICD-10-CM | POA: Diagnosis not present

## 2014-01-09 ENCOUNTER — Other Ambulatory Visit: Payer: Self-pay | Admitting: Obstetrics and Gynecology

## 2014-01-09 ENCOUNTER — Other Ambulatory Visit: Payer: Self-pay

## 2014-01-09 DIAGNOSIS — Z9011 Acquired absence of right breast and nipple: Secondary | ICD-10-CM

## 2014-01-09 DIAGNOSIS — N644 Mastodynia: Secondary | ICD-10-CM

## 2014-01-12 ENCOUNTER — Ambulatory Visit: Payer: Medicare Other

## 2014-01-14 ENCOUNTER — Other Ambulatory Visit: Payer: Medicare Other

## 2014-01-20 ENCOUNTER — Ambulatory Visit
Admission: RE | Admit: 2014-01-20 | Discharge: 2014-01-20 | Disposition: A | Discharge status: Home / Self Care | Payer: Medicare Other | Source: Ambulatory Visit | Attending: Obstetrics and Gynecology | Admitting: Obstetrics and Gynecology

## 2014-01-20 DIAGNOSIS — Z9011 Acquired absence of right breast and nipple: Secondary | ICD-10-CM

## 2014-01-20 DIAGNOSIS — N644 Mastodynia: Secondary | ICD-10-CM

## 2014-01-20 DIAGNOSIS — R928 Other abnormal and inconclusive findings on diagnostic imaging of breast: Secondary | ICD-10-CM | POA: Diagnosis not present

## 2014-01-20 DIAGNOSIS — N6089 Other benign mammary dysplasias of unspecified breast: Secondary | ICD-10-CM | POA: Diagnosis not present

## 2014-01-22 ENCOUNTER — Ambulatory Visit (HOSPITAL_COMMUNITY): Payer: Medicare Other | Attending: Cardiology | Admitting: Radiology

## 2014-01-22 DIAGNOSIS — I059 Rheumatic mitral valve disease, unspecified: Secondary | ICD-10-CM | POA: Insufficient documentation

## 2014-01-22 DIAGNOSIS — I359 Nonrheumatic aortic valve disorder, unspecified: Secondary | ICD-10-CM | POA: Diagnosis not present

## 2014-01-22 DIAGNOSIS — R011 Cardiac murmur, unspecified: Secondary | ICD-10-CM | POA: Diagnosis not present

## 2014-01-22 NOTE — Progress Notes (Signed)
Echocardiogram performed.  

## 2014-02-25 DIAGNOSIS — R1032 Left lower quadrant pain: Secondary | ICD-10-CM | POA: Diagnosis not present

## 2014-03-05 DIAGNOSIS — R197 Diarrhea, unspecified: Secondary | ICD-10-CM | POA: Diagnosis not present

## 2014-03-05 DIAGNOSIS — R1032 Left lower quadrant pain: Secondary | ICD-10-CM | POA: Diagnosis not present

## 2014-03-12 DIAGNOSIS — R259 Unspecified abnormal involuntary movements: Secondary | ICD-10-CM | POA: Diagnosis not present

## 2014-03-12 DIAGNOSIS — R109 Unspecified abdominal pain: Secondary | ICD-10-CM | POA: Diagnosis not present

## 2014-04-07 ENCOUNTER — Ambulatory Visit (INDEPENDENT_AMBULATORY_CARE_PROVIDER_SITE_OTHER): Payer: Medicare Other | Admitting: Cardiology

## 2014-04-07 ENCOUNTER — Encounter: Payer: Self-pay | Admitting: Cardiology

## 2014-04-07 VITALS — BP 121/59 | HR 71 | Ht 68.0 in | Wt 143.0 lb

## 2014-04-07 DIAGNOSIS — I252 Old myocardial infarction: Secondary | ICD-10-CM | POA: Diagnosis not present

## 2014-04-07 DIAGNOSIS — I2089 Other forms of angina pectoris: Secondary | ICD-10-CM

## 2014-04-07 DIAGNOSIS — E119 Type 2 diabetes mellitus without complications: Secondary | ICD-10-CM | POA: Diagnosis not present

## 2014-04-07 DIAGNOSIS — I350 Nonrheumatic aortic (valve) stenosis: Secondary | ICD-10-CM

## 2014-04-07 DIAGNOSIS — I359 Nonrheumatic aortic valve disorder, unspecified: Secondary | ICD-10-CM | POA: Diagnosis not present

## 2014-04-07 DIAGNOSIS — I208 Other forms of angina pectoris: Secondary | ICD-10-CM

## 2014-04-07 NOTE — Patient Instructions (Signed)
Your physician recommends that you continue on your current medications as directed. Please refer to the Current Medication list given to you today.  Your physician wants you to follow-up in: 6 months with Dr. Skains. You will receive a reminder letter in the mail two months in advance. If you don't receive a letter, please call our office to schedule the follow-up appointment.  

## 2014-04-07 NOTE — Progress Notes (Signed)
[place]. [address], [address] [place], [place]  [address] Phone: [phone] Fax:  [phone]  Date:  04/07/2014   ID:  Barbara Russell, DOB 05-02-1930, MRN [id number]  PCP:  Kandice Hams, MD   History of Present Illness: Barbara Russell is a 80 y.o. female who has a history of hypertension, diabetes, normal stress test in 06/2013 low risk with remote history of CAD documented in the past with myocardial infarction in the year 2000. She has been intolerant of statins.  She also had a several year history of pain/shortness of breath radiating to her neck that was described as moderate severity. At that time she did not wish to take Lasix. She was worried about worsening leg cramping.  Diarrhea for years, lactose intolerant. Main complaint. No hiatal hernia.  Saw my mother at wedding in [place].   Previously if goes up hill has pain that goes up to neck.Chasing dog feels same thing. Knitting same thing. Imdur has helped.   04/07/14 - Chest pain has gone away. Still occasionally he may feel some pain behind her ears but this is rare. She is doing very well with her medications. Echocardiogram showed mild aortic stenosis. Normal EF.  Wt Readings from Last 3 Encounters:  04/07/14 143 lb (64.864 kg)  01/06/14 146 lb (66.225 kg)     Past Medical History  Diagnosis Date  . Hypertension   . Hypercholesteremia   . Diabetes mellitus without complication   . Coronary artery disease     Remote Hx of CAD, normal stress test 2009  . Breast cancer     Hx of BC, s/p mastectomy on right 1999, s/p tamoxifen X1 year  . Lichen sclerosus   . Osteoarthrosis, unspecified whether generalized or localized, unspecified site   . Low back pain   . Myocardial infarction 2000  . SOB (shortness of breath)   . Pain     Several year Hx of pain/ SOB radiating to her neck  . Chest pain     EC Stress test 12/21/11 Low risk, no ischemia, normal EF  . Aortic heart murmur     aortic sclerosis w/out  and significant stenosis  . Dyspnea     Past Surgical History  Procedure Laterality Date  . Mastectomy Right 1999  . Replacement total knee Right 11/2009  . Vesicovaginal fistula closure w/ tah  1969  . Appendectomy    . Hand surgery Bilateral     CTS and trigger finger on right and left    Current Outpatient Prescriptions  Medication Sig Dispense Refill  . aspirin 81 MG tablet Take 81 mg by mouth daily.      . cetirizine (ZYRTEC) 10 MG tablet Take 10 mg by mouth daily.      . cholecalciferol (VITAMIN D) 1000 UNITS tablet Take 2,000 Units by mouth daily.       . famotidine (PEPCID) 10 MG tablet Take 10 mg by mouth as directed.      . furosemide (LASIX) 20 MG tablet Take 10 mg by mouth daily.      Marland Kitchen gabapentin (NEURONTIN) 300 MG capsule Take 300 mg by mouth 2 (two) times daily.      . isosorbide mononitrate (IMDUR) 30 MG 24 hr tablet Take 1 tablet (30 mg total) by mouth daily.  30 tablet  4  . loperamide (IMODIUM) 2 MG capsule Take 2 mg by mouth as needed for diarrhea or loose stools.      Marland Kitchen  metFORMIN (GLUCOPHAGE) 500 MG tablet Take 500 mg by mouth 2 (two) times daily with a meal.       . nitroGLYCERIN (NITROSTAT) 0.4 MG SL tablet Place 0.4 mg under the tongue every 5 (five) minutes as needed for chest pain.      Marland Kitchen olmesartan (BENICAR) 20 MG tablet Take 20 mg by mouth daily.      . ondansetron (ZOFRAN) 4 MG tablet Take 4 mg by mouth every 8 (eight) hours. As directed      . Potassium (POTASSIMIN PO) Take 1 tablet by mouth at bedtime.      Marland Kitchen zolpidem (AMBIEN) 5 MG tablet Take 2.5 mg by mouth at bedtime.       No current facility-administered medications for this visit.    Allergies:    Allergies  Allergen Reactions  . Benadryl [Diphenhydramine Hcl (Sleep)]   . Ciprocinonide [Fluocinolone]     CIPRO  . Lactose Intolerance (Gi)     DIARRHEA  . Macrobid [employer] Macro]   . Macrodantin [Nitrofurantoin Macrocrystal]   . Naproxen   . Penicillins   . Prednisone     . Quinine Sulfate [Quinine]   . Sulfa Antibiotics     Social History:  The patient  reports that she has never smoked. She does not have any smokeless tobacco history on file. She reports that she does not drink alcohol or use illicit drugs.   ROS:  Please see the history of present illness.   Denies any fevers, chills, orthopnea, PND    PHYSICAL EXAM: VS:  BP 121/59  Pulse 71  Ht 5\' 8"  (1.727 m)  Wt 143 lb (64.864 kg)  BMI 21.75 kg/m2 Well nourished, well developed, in no acute distress HEENT: normal Neck: no JVDradiation to carotids, murmur Cardiac:  normal S1, S2; RRR; 2/6 S murmurRUSB Lungs:  clear to auscultation bilaterally, no wheezing, rhonchi or rales Abd: soft, nontender, no hepatomegaly Ext: no edema Skin: warm and dry Neuro: no focal abnormalities noted  EKG:  Sinus rhythm rate 75 with old septal infarct pattern, poor R wave progression      ASSESSMENT AND PLAN:  1. Old myocardial infarction-year 2000.  Last stress test in 06/2013 was reassuring. Low risk. Isosorbide 30 mg daily seems to be helping. No headache. 2. Angina-as described above. Improved with isosorbide. No further chest pain. Occasional behind the ear pain. Continue to monitor. Discussed with her and her son. 3. Coronary artery disease- remote history in past. Documented in medical record. Stable. 4. Diabetes-per primary. 5. Heart murmur-mild aortic stenosis. Discussed at length today.  6. We'll see back in 50months.  Signed, Candee Furbish, MD [facility]  04/07/2014 10:15 AM

## 2014-04-08 DIAGNOSIS — K621 Rectal polyp: Secondary | ICD-10-CM | POA: Diagnosis not present

## 2014-04-08 DIAGNOSIS — K648 Other hemorrhoids: Secondary | ICD-10-CM | POA: Diagnosis not present

## 2014-04-08 DIAGNOSIS — R197 Diarrhea, unspecified: Secondary | ICD-10-CM | POA: Diagnosis not present

## 2014-04-08 DIAGNOSIS — D129 Benign neoplasm of anus and anal canal: Secondary | ICD-10-CM | POA: Diagnosis not present

## 2014-04-08 DIAGNOSIS — R1032 Left lower quadrant pain: Secondary | ICD-10-CM | POA: Diagnosis not present

## 2014-04-08 DIAGNOSIS — D128 Benign neoplasm of rectum: Secondary | ICD-10-CM | POA: Diagnosis not present

## 2014-04-08 DIAGNOSIS — K5730 Diverticulosis of large intestine without perforation or abscess without bleeding: Secondary | ICD-10-CM | POA: Diagnosis not present

## 2014-04-08 DIAGNOSIS — K620 Anal polyp: Secondary | ICD-10-CM | POA: Diagnosis not present

## 2014-04-17 DIAGNOSIS — I10 Essential (primary) hypertension: Secondary | ICD-10-CM | POA: Diagnosis not present

## 2014-04-17 DIAGNOSIS — R350 Frequency of micturition: Secondary | ICD-10-CM | POA: Diagnosis not present

## 2014-04-20 DIAGNOSIS — N390 Urinary tract infection, site not specified: Secondary | ICD-10-CM | POA: Diagnosis not present

## 2014-05-05 ENCOUNTER — Telehealth: Payer: Self-pay | Admitting: Cardiology

## 2014-05-05 NOTE — Telephone Encounter (Signed)
Reports blurry vision noticed only when she is reading.  "my vision is a little blurry, I can still read" Denies headache. Monitors BP.  Usually 140s/50s-60s Worried that isosorbide is the cause.  She had been on the internet researching. Saw opthamalogy 1 year ago. She will make appointment with them to discuss. I suggested maybe her vision has just changed. She is concerned if Dr. Marlou Porch thinks its the medication causing the vision change--she has had no angina since and would hate to stop it.   Advised patient I will send a message to Dr. Marlou Porch to inform. If any recommendations we will call her back.

## 2014-05-05 NOTE — Telephone Encounter (Signed)
Answering machine, did not leave message.

## 2014-05-05 NOTE — Telephone Encounter (Signed)
Try cutting Imdur in half. See if that helps. In the mean time, seeing eye doctor is advised.

## 2014-05-05 NOTE — Telephone Encounter (Signed)
New message     Patient calling C/O  Blurred vision from medication isosorbide.

## 2014-05-06 NOTE — Telephone Encounter (Signed)
Patient states she is going to go to eye dr first, then will cut Imdur in half if necessary, because it does help her chest pain.

## 2014-05-27 DIAGNOSIS — E119 Type 2 diabetes mellitus without complications: Secondary | ICD-10-CM | POA: Diagnosis not present

## 2014-05-27 DIAGNOSIS — R109 Unspecified abdominal pain: Secondary | ICD-10-CM | POA: Diagnosis not present

## 2014-05-27 DIAGNOSIS — Z1331 Encounter for screening for depression: Secondary | ICD-10-CM | POA: Diagnosis not present

## 2014-05-27 DIAGNOSIS — I2510 Atherosclerotic heart disease of native coronary artery without angina pectoris: Secondary | ICD-10-CM | POA: Diagnosis not present

## 2014-05-27 DIAGNOSIS — E785 Hyperlipidemia, unspecified: Secondary | ICD-10-CM | POA: Diagnosis not present

## 2014-05-27 DIAGNOSIS — I10 Essential (primary) hypertension: Secondary | ICD-10-CM | POA: Diagnosis not present

## 2014-05-27 DIAGNOSIS — Z0000 Encounter for general adult medical examination without abnormal findings: Secondary | ICD-10-CM | POA: Diagnosis not present

## 2014-05-28 ENCOUNTER — Other Ambulatory Visit (HOSPITAL_COMMUNITY): Payer: Self-pay | Admitting: Internal Medicine

## 2014-05-28 ENCOUNTER — Other Ambulatory Visit: Payer: Self-pay | Admitting: Internal Medicine

## 2014-05-28 DIAGNOSIS — R1011 Right upper quadrant pain: Secondary | ICD-10-CM

## 2014-05-28 DIAGNOSIS — R109 Unspecified abdominal pain: Secondary | ICD-10-CM

## 2014-05-29 ENCOUNTER — Ambulatory Visit (HOSPITAL_COMMUNITY)
Admission: RE | Admit: 2014-05-29 | Discharge: 2014-05-29 | Disposition: A | Discharge status: Home / Self Care | Payer: Medicare Other | Source: Ambulatory Visit | Attending: Internal Medicine | Admitting: Internal Medicine

## 2014-05-29 DIAGNOSIS — R109 Unspecified abdominal pain: Secondary | ICD-10-CM | POA: Insufficient documentation

## 2014-06-06 ENCOUNTER — Other Ambulatory Visit: Payer: Self-pay | Admitting: Cardiology

## 2014-06-07 DIAGNOSIS — M25519 Pain in unspecified shoulder: Secondary | ICD-10-CM | POA: Diagnosis not present

## 2014-06-09 ENCOUNTER — Other Ambulatory Visit: Payer: Self-pay | Admitting: Internal Medicine

## 2014-06-09 ENCOUNTER — Ambulatory Visit
Admission: RE | Admit: 2014-06-09 | Discharge: 2014-06-09 | Disposition: A | Discharge status: Home / Self Care | Payer: Medicare Other | Source: Ambulatory Visit | Attending: Internal Medicine | Admitting: Internal Medicine

## 2014-06-09 DIAGNOSIS — M25511 Pain in right shoulder: Secondary | ICD-10-CM

## 2014-06-09 DIAGNOSIS — M25519 Pain in unspecified shoulder: Secondary | ICD-10-CM | POA: Diagnosis not present

## 2014-06-09 DIAGNOSIS — M19019 Primary osteoarthritis, unspecified shoulder: Secondary | ICD-10-CM | POA: Diagnosis not present

## 2014-06-20 DIAGNOSIS — M25819 Other specified joint disorders, unspecified shoulder: Secondary | ICD-10-CM | POA: Diagnosis not present

## 2014-09-08 DIAGNOSIS — M25519 Pain in unspecified shoulder: Secondary | ICD-10-CM | POA: Diagnosis not present

## 2014-09-21 DIAGNOSIS — M25512 Pain in left shoulder: Secondary | ICD-10-CM | POA: Diagnosis not present

## 2014-09-22 DIAGNOSIS — L859 Epidermal thickening, unspecified: Secondary | ICD-10-CM | POA: Diagnosis not present

## 2014-09-22 DIAGNOSIS — Z23 Encounter for immunization: Secondary | ICD-10-CM | POA: Diagnosis not present

## 2014-09-24 DIAGNOSIS — M25512 Pain in left shoulder: Secondary | ICD-10-CM | POA: Diagnosis not present

## 2014-10-03 ENCOUNTER — Other Ambulatory Visit: Payer: Self-pay | Admitting: Cardiology

## 2014-10-07 DIAGNOSIS — M25512 Pain in left shoulder: Secondary | ICD-10-CM | POA: Diagnosis not present

## 2014-10-07 DIAGNOSIS — M7542 Impingement syndrome of left shoulder: Secondary | ICD-10-CM | POA: Diagnosis not present

## 2014-10-28 ENCOUNTER — Ambulatory Visit: Payer: Medicare Other | Admitting: Cardiology

## 2014-11-23 DIAGNOSIS — R197 Diarrhea, unspecified: Secondary | ICD-10-CM | POA: Diagnosis not present

## 2014-11-23 DIAGNOSIS — E785 Hyperlipidemia, unspecified: Secondary | ICD-10-CM | POA: Diagnosis not present

## 2014-11-23 DIAGNOSIS — E1165 Type 2 diabetes mellitus with hyperglycemia: Secondary | ICD-10-CM | POA: Diagnosis not present

## 2014-11-23 DIAGNOSIS — I10 Essential (primary) hypertension: Secondary | ICD-10-CM | POA: Diagnosis not present

## 2014-11-23 DIAGNOSIS — M545 Low back pain: Secondary | ICD-10-CM | POA: Diagnosis not present

## 2014-11-23 DIAGNOSIS — I2510 Atherosclerotic heart disease of native coronary artery without angina pectoris: Secondary | ICD-10-CM | POA: Diagnosis not present

## 2014-11-26 ENCOUNTER — Encounter: Payer: Self-pay | Admitting: Cardiology

## 2014-11-26 ENCOUNTER — Ambulatory Visit (INDEPENDENT_AMBULATORY_CARE_PROVIDER_SITE_OTHER): Payer: Medicare Other | Admitting: Cardiology

## 2014-11-26 VITALS — BP 130/82 | HR 72 | Ht 68.0 in | Wt 152.0 lb

## 2014-11-26 DIAGNOSIS — I2583 Coronary atherosclerosis due to lipid rich plaque: Secondary | ICD-10-CM

## 2014-11-26 DIAGNOSIS — I2510 Atherosclerotic heart disease of native coronary artery without angina pectoris: Secondary | ICD-10-CM | POA: Insufficient documentation

## 2014-11-26 DIAGNOSIS — I350 Nonrheumatic aortic (valve) stenosis: Secondary | ICD-10-CM

## 2014-11-26 DIAGNOSIS — I252 Old myocardial infarction: Secondary | ICD-10-CM | POA: Diagnosis not present

## 2014-11-26 DIAGNOSIS — I208 Other forms of angina pectoris: Secondary | ICD-10-CM

## 2014-11-26 NOTE — Progress Notes (Signed)
[place]. [address], [address] [place], [place]  [address] Phone: [phone] Fax:  [phone]  Date:  11/26/2014   ID:  Barbara Russell, DOB 05-14-1930, MRN [id number]  PCP:  Kandice Hams, MD   History of Present Illness: Barbara Russell is a 80 y.o. female who has a history of hypertension, diabetes, normal stress test in 06/2013 low risk with remote history of CAD documented in the past with myocardial infarction in the year 2000. She has been intolerant of statins.  She also had a several year history of pain/shortness of breath radiating to her neck that was described as moderate severity. At that time she did not wish to take Lasix. She was worried about worsening leg cramping.  Diarrhea for years, lactose intolerant. Main complaint. No hiatal hernia.  Saw my mother at wedding in [place].   Previously if goes up hill has pain that goes up to neck.Chasing dog feels same thing. Knitting same thing. Imdur has helped.   04/07/14 - Chest pain has gone away. Still occasionally he may feel some pain behind her ears but this is rare. She is doing very well with her medications. Echocardiogram showed mild aortic stenosis. Normal EF.  11/26/14-she will occasionally have neck pain when brushing after her dogs. Most of her complaints are orthopedic. She is not describing any significant change in shortness of breath. Doing well.  Wt Readings from Last 3 Encounters:  11/26/14 152 lb (68.947 kg)  04/07/14 143 lb (64.864 kg)  01/06/14 146 lb (66.225 kg)     Past Medical History  Diagnosis Date  . Hypertension   . Hypercholesteremia   . Diabetes mellitus without complication   . Coronary artery disease     Remote Hx of CAD, normal stress test 2009  . Breast cancer     Hx of BC, s/p mastectomy on right 1999, s/p tamoxifen X1 year  . Lichen sclerosus   . Osteoarthrosis, unspecified whether generalized or localized, unspecified site   . Low back pain   . Myocardial infarction  2000  . SOB (shortness of breath)   . Pain     Several year Hx of pain/ SOB radiating to her neck  . Chest pain     EC Stress test 12/21/11 Low risk, no ischemia, normal EF  . Aortic heart murmur     aortic sclerosis w/out and significant stenosis  . Dyspnea     Past Surgical History  Procedure Laterality Date  . Mastectomy Right 1999  . Replacement total knee Right 11/2009  . Vesicovaginal fistula closure w/ tah  1969  . Appendectomy    . Hand surgery Bilateral     CTS and trigger finger on right and left    Current Outpatient Prescriptions  Medication Sig Dispense Refill  . aspirin 81 MG tablet Take 81 mg by mouth daily.    . cetirizine (ZYRTEC) 10 MG tablet Take 10 mg by mouth daily.    . cholecalciferol (VITAMIN D) 1000 UNITS tablet Take 2,000 Units by mouth daily.     . famotidine (PEPCID) 10 MG tablet Take 10 mg by mouth as directed.    . furosemide (LASIX) 20 MG tablet Take 10 mg by mouth daily.    Marland Kitchen gabapentin (NEURONTIN) 300 MG capsule Take 300 mg by mouth 2 (two) times daily.    . isosorbide mononitrate (IMDUR) 30 MG 24 hr tablet TAKE ONE TABLET BY MOUTH ONE TIME DAILY  30 tablet  2  . loperamide (IMODIUM) 2 MG capsule Take 2 mg by mouth as needed for diarrhea or loose stools.    Marland Kitchen losartan (COZAAR) 25 MG tablet     . metFORMIN (GLUCOPHAGE) 500 MG tablet Take 500 mg by mouth 2 (two) times daily with a meal.     . nitroGLYCERIN (NITROSTAT) 0.4 MG SL tablet Place 0.4 mg under the tongue every 5 (five) minutes as needed for chest pain.    Marland Kitchen ondansetron (ZOFRAN) 4 MG tablet Take 4 mg by mouth every 8 (eight) hours. As directed    . Potassium (POTASSIMIN PO) Take 1 tablet by mouth at bedtime.    Marland Kitchen zolpidem (AMBIEN) 5 MG tablet Take 2.5 mg by mouth at bedtime.     No current facility-administered medications for this visit.    Allergies:    Allergies  Allergen Reactions  . Benadryl [Diphenhydramine Hcl (Sleep)]   . Ciprocinonide [Fluocinolone]     CIPRO  . Lactose  Intolerance (Gi)     DIARRHEA  . Macrobid [employer] Macro]   . Macrodantin [Nitrofurantoin Macrocrystal]   . Naproxen   . Penicillins   . Prednisone   . Quinine Sulfate [Quinine]   . Sulfa Antibiotics     Social History:  The patient  reports that she has never smoked. She does not have any smokeless tobacco history on file. She reports that she does not drink alcohol or use illicit drugs.   ROS:  Please see the history of present illness.   Denies any fevers, chills, orthopnea, PND    PHYSICAL EXAM: VS:  BP 130/82 mmHg  Pulse 72  Ht 5\' 8"  (1.727 m)  Wt 152 lb (68.947 kg)  BMI 23.12 kg/m2 Well nourished, well developed, in no acute distress HEENT: normal Neck: no JVDradiation to carotids, murmur Cardiac:  normal S1, S2; RRR; 2/6 S murmurRUSB Lungs:  clear to auscultation bilaterally, no wheezing, rhonchi or rales Abd: soft, nontender, no hepatomegaly Ext: no edema Skin: warm and dry Neuro: no focal abnormalities noted  EKG:  11/26/14-Sinus rhythm rate 25 with old septal infarct pattern, poor R wave progression   no change from prior EKG  Nuclear stress test 7/14-low risk, no ischemia   ASSESSMENT AND PLAN:  1. Old myocardial infarction-year 2000.  Last stress test in 06/2013 was reassuring. Low risk. Isosorbide 30 mg daily seems to be helping. No headache. 2. Angina-as described above. Question if neck/jaw discomfort is secondary to angina. Improved with isosorbide. No further chest pain. Occasional behind the ear pain. Continue to monitor. Discussed previously with her and her son. 3. Coronary artery disease- remote history in past. Documented in medical record. Stable. 4. Diabetes-per primary. 5. Heart murmur-mild aortic stenosis. Discussed  today.  6. We'll see back in 57months.  Signed, Candee Furbish, MD [facility]  11/26/2014 10:53 AM

## 2014-11-26 NOTE — Patient Instructions (Signed)
The current medical regimen is effective;  continue present plan and medications.  Follow up in 6 months with Dr. Skains.  You will receive a letter in the mail 2 months before you are due.  Please call us when you receive this letter to schedule your follow up appointment.  

## 2015-01-06 ENCOUNTER — Other Ambulatory Visit: Payer: Self-pay | Admitting: *Deleted

## 2015-01-06 MED ORDER — ISOSORBIDE MONONITRATE ER 30 MG PO TB24: 30.0000 mg | ORAL_TABLET | Freq: Every day | ORAL | Status: DC

## 2015-01-12 ENCOUNTER — Other Ambulatory Visit (HOSPITAL_COMMUNITY): Payer: Self-pay | Admitting: Internal Medicine

## 2015-01-12 DIAGNOSIS — R109 Unspecified abdominal pain: Secondary | ICD-10-CM

## 2015-01-27 ENCOUNTER — Ambulatory Visit (HOSPITAL_COMMUNITY)
Admission: RE | Admit: 2015-01-27 | Discharge: 2015-01-27 | Disposition: A | Discharge status: Home / Self Care | Payer: Medicare Other | Source: Ambulatory Visit | Attending: Internal Medicine | Admitting: Internal Medicine

## 2015-02-01 ENCOUNTER — Encounter (HOSPITAL_COMMUNITY): Payer: Medicare Other

## 2015-02-22 ENCOUNTER — Encounter (HOSPITAL_COMMUNITY): Payer: Self-pay

## 2015-02-22 ENCOUNTER — Ambulatory Visit (HOSPITAL_COMMUNITY)
Admission: RE | Admit: 2015-02-22 | Discharge: 2015-02-22 | Disposition: A | Discharge status: Home / Self Care | Payer: Medicare Other | Source: Ambulatory Visit | Attending: Internal Medicine | Admitting: Internal Medicine

## 2015-02-22 DIAGNOSIS — R109 Unspecified abdominal pain: Secondary | ICD-10-CM

## 2015-02-22 DIAGNOSIS — R1011 Right upper quadrant pain: Secondary | ICD-10-CM | POA: Insufficient documentation

## 2015-02-22 MED ORDER — TECHNETIUM TC 99M MEBROFENIN IV KIT
5.5000 | PACK | Freq: Once | INTRAVENOUS | Status: AC | PRN
  Administered 2015-02-22: 5.5 via INTRAVENOUS

## 2015-02-22 MED ORDER — SINCALIDE 5 MCG IJ SOLR
0.0200 ug/kg | Freq: Once | INTRAMUSCULAR | Status: AC
  Administered 2015-02-22: 1.4 ug via INTRAVENOUS

## 2015-03-14 DIAGNOSIS — J209 Acute bronchitis, unspecified: Secondary | ICD-10-CM | POA: Diagnosis not present

## 2015-03-18 ENCOUNTER — Other Ambulatory Visit: Payer: Self-pay | Admitting: Internal Medicine

## 2015-03-18 ENCOUNTER — Ambulatory Visit
Admission: RE | Admit: 2015-03-18 | Discharge: 2015-03-18 | Disposition: A | Discharge status: Home / Self Care | Payer: Medicare Other | Source: Ambulatory Visit | Attending: Internal Medicine | Admitting: Internal Medicine

## 2015-03-18 DIAGNOSIS — R05 Cough: Secondary | ICD-10-CM

## 2015-03-18 DIAGNOSIS — R059 Cough, unspecified: Secondary | ICD-10-CM

## 2015-03-18 DIAGNOSIS — R509 Fever, unspecified: Secondary | ICD-10-CM

## 2015-03-18 DIAGNOSIS — J40 Bronchitis, not specified as acute or chronic: Secondary | ICD-10-CM | POA: Diagnosis not present

## 2015-03-23 DIAGNOSIS — Z6827 Body mass index (BMI) 27.0-27.9, adult: Secondary | ICD-10-CM | POA: Diagnosis not present

## 2015-03-23 DIAGNOSIS — Z01419 Encounter for gynecological examination (general) (routine) without abnormal findings: Secondary | ICD-10-CM | POA: Diagnosis not present

## 2015-03-23 DIAGNOSIS — Z1231 Encounter for screening mammogram for malignant neoplasm of breast: Secondary | ICD-10-CM | POA: Diagnosis not present

## 2015-04-06 ENCOUNTER — Emergency Department (HOSPITAL_COMMUNITY)
Admission: EM | Admit: 2015-04-06 | Discharge: 2015-04-06 | Disposition: A | Discharge status: Home / Self Care | Payer: Medicare Other | Attending: Emergency Medicine | Admitting: Emergency Medicine

## 2015-04-06 ENCOUNTER — Encounter (HOSPITAL_COMMUNITY): Payer: Self-pay | Admitting: *Deleted

## 2015-04-06 DIAGNOSIS — Z872 Personal history of diseases of the skin and subcutaneous tissue: Secondary | ICD-10-CM | POA: Diagnosis not present

## 2015-04-06 DIAGNOSIS — I252 Old myocardial infarction: Secondary | ICD-10-CM | POA: Diagnosis not present

## 2015-04-06 DIAGNOSIS — Z880 Allergy status to penicillin: Secondary | ICD-10-CM | POA: Insufficient documentation

## 2015-04-06 DIAGNOSIS — R011 Cardiac murmur, unspecified: Secondary | ICD-10-CM | POA: Insufficient documentation

## 2015-04-06 DIAGNOSIS — I2510 Atherosclerotic heart disease of native coronary artery without angina pectoris: Secondary | ICD-10-CM | POA: Insufficient documentation

## 2015-04-06 DIAGNOSIS — M1990 Unspecified osteoarthritis, unspecified site: Secondary | ICD-10-CM | POA: Insufficient documentation

## 2015-04-06 DIAGNOSIS — Z853 Personal history of malignant neoplasm of breast: Secondary | ICD-10-CM | POA: Insufficient documentation

## 2015-04-06 DIAGNOSIS — R51 Headache: Secondary | ICD-10-CM | POA: Diagnosis not present

## 2015-04-06 DIAGNOSIS — R030 Elevated blood-pressure reading, without diagnosis of hypertension: Secondary | ICD-10-CM | POA: Diagnosis not present

## 2015-04-06 DIAGNOSIS — E119 Type 2 diabetes mellitus without complications: Secondary | ICD-10-CM | POA: Insufficient documentation

## 2015-04-06 DIAGNOSIS — Z7982 Long term (current) use of aspirin: Secondary | ICD-10-CM | POA: Insufficient documentation

## 2015-04-06 DIAGNOSIS — I10 Essential (primary) hypertension: Secondary | ICD-10-CM | POA: Insufficient documentation

## 2015-04-06 DIAGNOSIS — Z79899 Other long term (current) drug therapy: Secondary | ICD-10-CM | POA: Insufficient documentation

## 2015-04-06 LAB — I-STAT CHEM 8, ED
BUN: 17 mg/dL (ref 6–23)
CHLORIDE: 100 mmol/L (ref 96–112)
CREATININE: 0.9 mg/dL (ref 0.50–1.10)
Calcium, Ion: 1.22 mmol/L (ref 1.13–1.30)
Glucose, Bld: 116 mg/dL — ABNORMAL HIGH (ref 70–99)
HCT: 37 % (ref 36.0–46.0)
Hemoglobin: 12.6 g/dL (ref 12.0–15.0)
POTASSIUM: 4 mmol/L (ref 3.5–5.1)
Sodium: 142 mmol/L (ref 135–145)
TCO2: 25 mmol/L (ref 0–100)

## 2015-04-06 NOTE — ED Notes (Signed)
Pt. Left with all belongings 

## 2015-04-06 NOTE — Discharge Instructions (Signed)
Hypertension Call Dr.Polite's office tomorrow to arrange to get your blood pressure rechecked in a week. Today's was  elevated at 163/84 Hypertension, commonly called high blood pressure, is when the force of blood pumping through your arteries is too strong. Your arteries are the blood vessels that carry blood from your heart throughout your body. A blood pressure reading consists of a higher number over a lower number, such as 110/72. The higher number (systolic) is the pressure inside your arteries when your heart pumps. The lower number (diastolic) is the pressure inside your arteries when your heart relaxes. Ideally you want your blood pressure below 120/80. Hypertension forces your heart to work harder to pump blood. Your arteries may become narrow or stiff. Having hypertension puts you at risk for heart disease, stroke, and other problems.  RISK FACTORS Some risk factors for high blood pressure are controllable. Others are not.  Risk factors you cannot control include:   Race. You may be at higher risk if you are African American.  Age. Risk increases with age.  Gender. Men are at higher risk than women before age 64 years. After age 27, women are at higher risk than men. Risk factors you can control include:  Not getting enough exercise or physical activity.  Being overweight.  Getting too much fat, sugar, calories, or salt in your diet.  Drinking too much alcohol. SIGNS AND SYMPTOMS Hypertension does not usually cause signs or symptoms. Extremely high blood pressure (hypertensive crisis) may cause headache, anxiety, shortness of breath, and nosebleed. DIAGNOSIS  To check if you have hypertension, your health care provider will measure your blood pressure while you are seated, with your arm held at the level of your heart. It should be measured at least twice using the same arm. Certain conditions can cause a difference in blood pressure between your right and left arms. A blood  pressure reading that is higher than normal on one occasion does not mean that you need treatment. If one blood pressure reading is high, ask your health care provider about having it checked again. TREATMENT  Treating high blood pressure includes making lifestyle changes and possibly taking medicine. Living a healthy lifestyle can help lower high blood pressure. You may need to change some of your habits. Lifestyle changes may include:  Following the DASH diet. This diet is high in fruits, vegetables, and whole grains. It is low in salt, red meat, and added sugars.  Getting at least 2 hours of brisk physical activity every week.  Losing weight if necessary.  Not smoking.  Limiting alcoholic beverages.  Learning ways to reduce stress. If lifestyle changes are not enough to get your blood pressure under control, your health care provider may prescribe medicine. You may need to take more than one. Work closely with your health care provider to understand the risks and benefits. HOME CARE INSTRUCTIONS  Have your blood pressure rechecked as directed by your health care provider.   Take medicines only as directed by your health care provider. Follow the directions carefully. Blood pressure medicines must be taken as prescribed. The medicine does not work as well when you skip doses. Skipping doses also puts you at risk for problems.   Do not smoke.   Monitor your blood pressure at home as directed by your health care provider. SEEK MEDICAL CARE IF:   You think you are having a reaction to medicines taken.  You have recurrent headaches or feel dizzy.  You have swelling in your  ankles.  You have trouble with your vision. SEEK IMMEDIATE MEDICAL CARE IF:  You develop a severe headache or confusion.  You have unusual weakness, numbness, or feel faint.  You have severe chest or abdominal pain.  You vomit repeatedly.  You have trouble breathing. MAKE SURE YOU:   Understand  these instructions.  Will watch your condition.  Will get help right away if you are not doing well or get worse. Document Released: 11/27/2005 Document Revised: 04/13/2014 Document Reviewed: 09/19/2013 [facility] Patient Information 2015 [place], [place]. This information is not intended to replace advice given to you by your health care provider. Make sure you discuss any questions you have with your health care provider.

## 2015-04-06 NOTE — ED Notes (Signed)
Pt. Has a hx of hypertension and felt like her BP was elevated. Pt. Checked BP with home automated cuff 220/90. EMS arrived and checked BP manually 220/98. On arrival to ED 180/98. CBG 223. Pt c/o no pain.

## 2015-04-06 NOTE — ED Provider Notes (Signed)
CSN: [id number]     Arrival date & time 04/06/15  2059 History   First MD Initiated Contact with Patient 04/06/15 2104     Chief Complaint  Patient presents with  . Hypertension     (Consider location/radiation/quality/duration/timing/severity/associated sxs/prior Treatment) HPI Patient had a headache at the top of her head noted at 8 PM tonight which lasted 1 minute and resolved spontaneously. She took her blood pressure and noted it to be 210/95. EMS was called. Transported patient here without treatment. Patient is presently asymptomatic. Denies other associated symptoms. No vision change no chest pain no shortness breath no abdominal pain no focal numbness or weakness. Past Medical History  Diagnosis Date  . Hypertension   . Hypercholesteremia   . Diabetes mellitus without complication   . Coronary artery disease     Remote Hx of CAD, normal stress test 2009  . Breast cancer     Hx of BC, s/p mastectomy on right 1999, s/p tamoxifen X1 year  . Lichen sclerosus   . Osteoarthrosis, unspecified whether generalized or localized, unspecified site   . Low back pain   . Myocardial infarction 2000  . SOB (shortness of breath)   . Pain     Several year Hx of pain/ SOB radiating to her neck  . Chest pain     EC Stress test 12/21/11 Low risk, no ischemia, normal EF  . Aortic heart murmur     aortic sclerosis w/out and significant stenosis  . Dyspnea    Past Surgical History  Procedure Laterality Date  . Mastectomy Right 1999  . Replacement total knee Right 11/2009  . Vesicovaginal fistula closure w/ tah  1969  . Appendectomy    . Hand surgery Bilateral     CTS and trigger finger on right and left   Family History  Problem Relation Age of Onset  . Cancer Mother     cervical  . Heart disease Father    History  Substance Use Topics  . Smoking status: Never Smoker   . Smokeless tobacco: Never Used  . Alcohol Use: No   OB History    No data available     Review of Systems   Constitutional: Negative.   Respiratory: Negative.   Cardiovascular: Negative.   Gastrointestinal: Negative.   Musculoskeletal: Negative.   Skin: Negative.   Neurological: Positive for headaches.  Psychiatric/Behavioral: Negative.   All other systems reviewed and are negative.     Allergies  Benadryl; Ciprocinonide; Lactose intolerance (gi); Macrobid; Macrodantin; Naproxen; Penicillins; Prednisone; Quinine sulfate; and Sulfa antibiotics  Home Medications   Prior to Admission medications   Medication Sig Start Date End Date Taking? Authorizing Provider  aspirin 81 MG tablet Take 81 mg by mouth daily.    Historical Provider, MD  cetirizine (ZYRTEC) 10 MG tablet Take 10 mg by mouth daily.    Historical Provider, MD  cholecalciferol (VITAMIN D) 1000 UNITS tablet Take 2,000 Units by mouth daily.     Historical Provider, MD  famotidine (PEPCID) 10 MG tablet Take 10 mg by mouth as directed.    Historical Provider, MD  furosemide (LASIX) 20 MG tablet Take 10 mg by mouth daily.    Historical Provider, MD  gabapentin (NEURONTIN) 300 MG capsule Take 300 mg by mouth 2 (two) times daily.    Historical Provider, MD  isosorbide mononitrate (IMDUR) 30 MG 24 hr tablet Take 1 tablet (30 mg total) by mouth daily. 01/06/15   Jerline Pain, MD  loperamide (  IMODIUM) 2 MG capsule Take 2 mg by mouth as needed for diarrhea or loose stools.    Historical Provider, MD  losartan (COZAAR) 25 MG tablet  11/17/14   Historical Provider, MD  metFORMIN (GLUCOPHAGE) 500 MG tablet Take 500 mg by mouth 2 (two) times daily with a meal.     Historical Provider, MD  nitroGLYCERIN (NITROSTAT) 0.4 MG SL tablet Place 0.4 mg under the tongue every 5 (five) minutes as needed for chest pain.    Historical Provider, MD  ondansetron (ZOFRAN) 4 MG tablet Take 4 mg by mouth every 8 (eight) hours. As directed    Historical Provider, MD  Potassium (POTASSIMIN PO) Take 1 tablet by mouth at bedtime.    Historical Provider, MD  zolpidem  (AMBIEN) 5 MG tablet Take 2.5 mg by mouth at bedtime.    Historical Provider, MD   There were no vitals taken for this visit. Physical Exam  Constitutional: She is oriented to person, place, and time. She appears well-developed and well-nourished.  HENT:  Head: Normocephalic and atraumatic.  Eyes: Conjunctivae and EOM are normal. Pupils are equal, round, and reactive to light.  Fundi not well visualized  Neck: Neck supple. No tracheal deviation present. No thyromegaly present.  Cardiovascular: Normal rate and regular rhythm.   Murmur heard. 3/6 systolic ejection murmur  Pulmonary/Chest: Effort normal and breath sounds normal.  Abdominal: Soft. Bowel sounds are normal. She exhibits no distension. There is no tenderness.  Musculoskeletal: Normal range of motion. She exhibits no edema or tenderness.  Neurological: She is alert and oriented to person, place, and time. She has normal reflexes. No cranial nerve deficit. Coordination normal.  DTRs symmetric bilaterally at knee jerk ankle jerk and biceps gait normal Romberg normal pronator drift normal finger to nose normal  Skin: Skin is warm and dry. No rash noted.  Psychiatric: She has a normal mood and affect.  Nursing note and vitals reviewed.   ED Course  Procedures (including critical care time) Labs Review Labs Reviewed - No data to display  Imaging Review No results found.   EKG Interpretation None     11 PM patient remains asymptomatic. Alert Glasgow Coma Score 15 Results for orders placed or performed during the hospital encounter of 04/06/15  I-Stat Chem 8, ED  Result Value Ref Range   Sodium 142 135 - 145 mmol/L   Potassium 4.0 3.5 - 5.1 mmol/L   Chloride 100 96 - 112 mmol/L   BUN 17 6 - 23 mg/dL   Creatinine, Ser 0.90 0.50 - 1.10 mg/dL   Glucose, Bld 116 (H) 70 - 99 mg/dL   Calcium, Ion 1.22 1.13 - 1.30 mmol/L   TCO2 25 0 - 100 mmol/L   Hemoglobin 12.6 12.0 - 15.0 g/dL   HCT 37.0 36.0 - 46.0 %   Dg Chest 2  View  03/18/2015   CLINICAL DATA:  Cough and fever.  EXAM: CHEST  2 VIEW  COMPARISON:  10/09/2013.  FINDINGS: Mediastinum hilar structures are unremarkable. Stable cardiomegaly normal pulmonary vascularity. No focal infiltrate. No pleural effusion or pneumothorax. Right mastectomy. Degenerative changes thoracic spine.  IMPRESSION: 1. Stable cardiomegaly.  No acute cardiopulmonary disease. 2. Right mastectomy.   Electronically Signed   By: Marcello Moores  Register   On: 03/18/2015 11:57    MDM  Plan follow-up with Dr. Delfina Redwood. Blood pressure recheck one week Herbert Pun is hypertension Final diagnoses:  None        Orlie Dakin, MD 04/06/15 [phone]

## 2015-04-08 DIAGNOSIS — I10 Essential (primary) hypertension: Secondary | ICD-10-CM | POA: Diagnosis not present

## 2015-05-06 DIAGNOSIS — I10 Essential (primary) hypertension: Secondary | ICD-10-CM | POA: Diagnosis not present

## 2015-05-12 ENCOUNTER — Ambulatory Visit (INDEPENDENT_AMBULATORY_CARE_PROVIDER_SITE_OTHER): Payer: Medicare Other | Admitting: Cardiology

## 2015-05-12 ENCOUNTER — Encounter: Payer: Self-pay | Admitting: Cardiology

## 2015-05-12 VITALS — BP 110/50 | HR 64 | Ht 63.0 in | Wt 147.8 lb

## 2015-05-12 DIAGNOSIS — I350 Nonrheumatic aortic (valve) stenosis: Secondary | ICD-10-CM | POA: Diagnosis not present

## 2015-05-12 DIAGNOSIS — I252 Old myocardial infarction: Secondary | ICD-10-CM | POA: Diagnosis not present

## 2015-05-12 DIAGNOSIS — I2510 Atherosclerotic heart disease of native coronary artery without angina pectoris: Secondary | ICD-10-CM | POA: Diagnosis not present

## 2015-05-12 DIAGNOSIS — I2583 Coronary atherosclerosis due to lipid rich plaque: Principal | ICD-10-CM

## 2015-05-12 NOTE — Patient Instructions (Signed)
Medication Instructions:  Your physician recommends that you continue on your current medications as directed. Please refer to the Current Medication list given to you today.  Follow-Up: Follow up in 6 months with Dr. Skains.  You will receive a letter in the mail 2 months before you are due.  Please call us when you receive this letter to schedule your follow up appointment.  Thank you for choosing [facility]!!     

## 2015-05-12 NOTE — Progress Notes (Signed)
[place] [address] [place], [place]  [address] Phone: [phone] Fax:  [phone]  Date:  05/12/2015   ID:  Barbara Russell, DOB 04/01/1930, MRN [id number]  PCP:  Kandice Hams, MD   History of Present Illness: Barbara Russell is a 81 y.o. female who has a history of hypertension, diabetes, normal stress test in 06/2013 low risk with remote history of CAD documented in the past with myocardial infarction in the year 2000. She has been intolerant of statins.  She also had a several year history of pain/shortness of breath radiating to her neck that was described as moderate severity. At that time she did not wish to take Lasix. She was worried about worsening leg cramping.  Diarrhea for years, lactose intolerant. Main complaint. No hiatal hernia.  Saw my mother at wedding in [place].   Previously if goes up hill has pain that goes up to neck.Chasing dog feels same thing. Knitting same thing. Imdur has helped.   04/07/14 - Chest pain has gone away. Still occasionally he may feel some pain behind her ears but this is rare. She is doing very well with her medications. Echocardiogram showed mild aortic stenosis. Normal EF.  11/26/14-she will occasionally have neck pain when brushing after her dogs. Most of her complaints are orthopedic. She is not describing any significant change in shortness of breath. Doing well.  05/12/15 - Woke up felt sharp pain in head. 220/95 in night BP. Took her to ER. Dr. Delfina Redwood looked at BP log. Eventually came back down. In the distant past she has some orthostatic hypotension. She's not having any chest pain, no shortness of breath. She had previously a right knee replacement.  Wt Readings from Last 3 Encounters:  05/12/15 147 lb 12.8 oz (67.042 kg)  04/06/15 149 lb (67.586 kg)  11/26/14 152 lb (68.947 kg)     Past Medical History  Diagnosis Date  . Hypertension   . Hypercholesteremia   . Diabetes mellitus without complication   .  Coronary artery disease     Remote Hx of CAD, normal stress test 2009  . Breast cancer     Hx of BC, s/p mastectomy on right 1999, s/p tamoxifen X1 year  . Lichen sclerosus   . Osteoarthrosis, unspecified whether generalized or localized, unspecified site   . Low back pain   . Myocardial infarction 2000  . SOB (shortness of breath)   . Pain     Several year Hx of pain/ SOB radiating to her neck  . Chest pain     EC Stress test 12/21/11 Low risk, no ischemia, normal EF  . Aortic heart murmur     aortic sclerosis w/out and significant stenosis  . Dyspnea     Past Surgical History  Procedure Laterality Date  . Mastectomy Right 1999  . Replacement total knee Right 11/2009  . Vesicovaginal fistula closure w/ tah  1969  . Appendectomy    . Hand surgery Bilateral     CTS and trigger finger on right and left    Current Outpatient Prescriptions  Medication Sig Dispense Refill  . aspirin 81 MG tablet Take 81 mg by mouth daily.    . cetirizine (ZYRTEC) 10 MG tablet Take 10 mg by mouth daily.    . cholecalciferol (VITAMIN D) 1000 UNITS tablet Take 2,000 Units by mouth daily.     . furosemide (LASIX) 20 MG tablet Take 20 mg by mouth daily.     Marland Kitchen  gabapentin (NEURONTIN) 300 MG capsule Take 600 mg by mouth at bedtime.     . isosorbide mononitrate (IMDUR) 30 MG 24 hr tablet Take 1 tablet (30 mg total) by mouth daily. 30 tablet 4  . losartan (COZAAR) 25 MG tablet Take 25 mg by mouth daily.     . metFORMIN (GLUCOPHAGE) 500 MG tablet Take 500 mg by mouth 2 (two) times daily with a meal.     . nitroGLYCERIN (NITROSTAT) 0.4 MG SL tablet Place 0.4 mg under the tongue every 5 (five) minutes as needed for chest pain.    . potassium gluconate 595 MG TABS tablet Take 595 mg by mouth at bedtime.    Marland Kitchen zolpidem (AMBIEN) 5 MG tablet Take 2.5 mg by mouth at bedtime.     No current facility-administered medications for this visit.    Allergies:    Allergies  Allergen Reactions  . Benadryl  [Diphenhydramine Hcl (Sleep)]   . Ciprofloxacin   . Lactose Intolerance (Gi)     DIARRHEA  . Macrobid [employer] Macro]   . Macrodantin [Nitrofurantoin Macrocrystal]   . Naproxen   . Penicillins   . Prednisone   . Quinine Sulfate [Quinine]   . Sulfa Antibiotics   . Tramadol     Social History:  The patient  reports that she has never smoked. She has never used smokeless tobacco. She reports that she does not drink alcohol or use illicit drugs.   ROS:  Please see the history of present illness.   Denies any fevers, chills, orthopnea, PND    PHYSICAL EXAM: VS:  BP 110/50 mmHg  Pulse 64  Ht 5\' 3"  (1.6 m)  Wt 147 lb 12.8 oz (67.042 kg)  BMI 26.19 kg/m2 Well nourished, well developed, in no acute distress HEENT: normal Neck: no JVDradiation to carotids, murmur Cardiac:  normal S1, S2; RRR; 2/6 S murmurRUSB Lungs:  clear to auscultation bilaterally, no wheezing, rhonchi or rales Abd: soft, nontender, no hepatomegaly Ext: no edema Skin: warm and dry Neuro: no focal abnormalities noted  EKG:  11/26/14-Sinus rhythm rate 72 with old septal infarct pattern, poor R wave progression   no change from prior EKG  Echocardiogram: 01/22/14 - Left ventricle: The cavity size was normal. Wall thickness was increased in a pattern of mild LVH. Systolic function was vigorous. The estimated ejection fraction was in the range of 65% to 70%. Wall motion was normal; there were no regional wall motion abnormalities. Doppler parameters are consistent with abnormal left ventricular relaxation (grade 1 diastolic dysfunction). Doppler parameters are consistent with high ventricular filling pressure. - Aortic valve: Cusp separation was reduced. There was mild stenosis. Peak velocity: 259cm/s (S). Mean gradient: 72mm Hg (S). - Mitral valve: Moderately calcified annulus. - Left atrium: The atrium was mildly dilated.  Nuclear stress test 7/14-low risk, no ischemia    ASSESSMENT AND PLAN:  1. Old myocardial infarction-year 2000.  Last stress test in 06/2013 was reassuring. Low risk. Isosorbide 30 mg daily seems to be helping. No headache. 2. Labile blood pressure-once at night had sharp pain in head, still up, felt somewhat dizzy. Should blood pressure, was 102 systolic. Called the [employer] nurse. Went to the emergency room. Eventually it came down. Dr. polite has been monitoring closely and now it is back within normal range. 3. Angina-as described above. Question if neck/jaw discomfort is secondary to angina. Improved with isosorbide. No further chest pain. Occasional behind the ear pain. Continue to monitor. Discussed previously with  her and her son. 4. Coronary artery disease- remote history in past. Documented in medical record. Stable. 5. Diabetes-per primary. 6. Heart murmur-mild aortic stenosis. Discussed  today.  7. We'll see back in 6 months.  Signed, Candee Furbish, MD [facility]  05/12/2015 10:29 AM

## 2015-06-03 ENCOUNTER — Other Ambulatory Visit: Payer: Self-pay | Admitting: Cardiology

## 2015-06-04 ENCOUNTER — Other Ambulatory Visit: Payer: Self-pay

## 2015-06-04 MED ORDER — ISOSORBIDE MONONITRATE ER 30 MG PO TB24: 30.0000 mg | ORAL_TABLET | Freq: Every day | ORAL | Status: DC

## 2015-06-30 DIAGNOSIS — L821 Other seborrheic keratosis: Secondary | ICD-10-CM | POA: Diagnosis not present

## 2015-06-30 DIAGNOSIS — L570 Actinic keratosis: Secondary | ICD-10-CM | POA: Diagnosis not present

## 2015-07-28 DIAGNOSIS — E1165 Type 2 diabetes mellitus with hyperglycemia: Secondary | ICD-10-CM | POA: Diagnosis not present

## 2015-07-28 DIAGNOSIS — E1122 Type 2 diabetes mellitus with diabetic chronic kidney disease: Secondary | ICD-10-CM | POA: Diagnosis not present

## 2015-07-28 DIAGNOSIS — Z0000 Encounter for general adult medical examination without abnormal findings: Secondary | ICD-10-CM | POA: Diagnosis not present

## 2015-07-28 DIAGNOSIS — E785 Hyperlipidemia, unspecified: Secondary | ICD-10-CM | POA: Diagnosis not present

## 2015-07-28 DIAGNOSIS — I10 Essential (primary) hypertension: Secondary | ICD-10-CM | POA: Diagnosis not present

## 2015-07-28 DIAGNOSIS — Z1389 Encounter for screening for other disorder: Secondary | ICD-10-CM | POA: Diagnosis not present

## 2015-07-28 DIAGNOSIS — I2510 Atherosclerotic heart disease of native coronary artery without angina pectoris: Secondary | ICD-10-CM | POA: Diagnosis not present

## 2015-08-09 DIAGNOSIS — M25561 Pain in right knee: Secondary | ICD-10-CM | POA: Diagnosis not present

## 2015-09-27 DIAGNOSIS — Z961 Presence of intraocular lens: Secondary | ICD-10-CM | POA: Diagnosis not present

## 2015-10-08 DIAGNOSIS — M7071 Other bursitis of hip, right hip: Secondary | ICD-10-CM | POA: Diagnosis not present

## 2015-11-08 ENCOUNTER — Ambulatory Visit (INDEPENDENT_AMBULATORY_CARE_PROVIDER_SITE_OTHER): Payer: Medicare Other | Admitting: Cardiology

## 2015-11-08 ENCOUNTER — Encounter: Payer: Self-pay | Admitting: Cardiology

## 2015-11-08 VITALS — BP 126/64 | HR 58 | Ht 63.0 in | Wt 152.0 lb

## 2015-11-08 DIAGNOSIS — I350 Nonrheumatic aortic (valve) stenosis: Secondary | ICD-10-CM

## 2015-11-08 DIAGNOSIS — I2510 Atherosclerotic heart disease of native coronary artery without angina pectoris: Secondary | ICD-10-CM | POA: Diagnosis not present

## 2015-11-08 DIAGNOSIS — I2583 Coronary atherosclerosis due to lipid rich plaque: Principal | ICD-10-CM

## 2015-11-08 DIAGNOSIS — I252 Old myocardial infarction: Secondary | ICD-10-CM | POA: Diagnosis not present

## 2015-11-08 DIAGNOSIS — I208 Other forms of angina pectoris: Secondary | ICD-10-CM | POA: Diagnosis not present

## 2015-11-08 NOTE — Progress Notes (Signed)
[facility]. [address], [address] [place], [place]  [address] Phone: [phone] Fax:  [phone]  Date:  11/08/2015   ID:  Girl Lorio Leavell, DOB [birth year], MRN [id number]  PCP:  Kandice Hams, MD   History of Present Illness: ASHALEE OLIVIA is a 81 y.o. female who has a history of hypertension, diabetes, normal stress test in 06/2013 low risk with remote history of CAD documented in the past with myocardial infarction in the year 2000. She has been intolerant of statins.  She also had a several year history of pain/shortness of breath radiating to her neck that was described as moderate severity. At that time she did not wish to take Lasix. She was worried about worsening leg cramping.  Diarrhea for years, lactose intolerant. Main complaint. No hiatal hernia.  Saw my mother at wedding in [place].   Previously if goes up hill has pain that goes up to neck.Chasing dog feels same thing. Knitting same thing. Imdur has helped.   04/07/14 - Chest pain has gone away. Still occasionally he may feel some pain behind her ears but this is rare. She is doing very well with her medications. Echocardiogram showed mild aortic stenosis. Normal EF.  11/26/14-she will occasionally have neck pain when brushing after her dogs. Most of her complaints are orthopedic. She is not describing any significant change in shortness of breath. Doing well.  05/12/15 - Woke up felt sharp pain in head. 220/95 in night BP. Took her to ER. Dr. Delfina Redwood looked at BP log. Eventually came back down. In the distant past she has some orthostatic hypotension. She's not having any chest pain, no shortness of breath. She had previously a right knee replacement.  11/08/15-overall doing well. She had a fleeting chest discomfort in the middle the night, resolved on its own. Occasionally she will have burning sensation with her nitroglycerin under her tongue.  Wt Readings from Last 3 Encounters:  11/08/15 152 lb (68.947 kg)    05/12/15 147 lb 12.8 oz (67.042 kg)  04/06/15 149 lb (67.586 kg)     Past Medical History  Diagnosis Date  . Hypertension   . Hypercholesteremia   . Diabetes mellitus without complication ([place])   . Coronary artery disease     Remote Hx of CAD, normal stress test 2009  . Breast cancer ([place])     Hx of BC, s/p mastectomy on right 1999, s/p tamoxifen X1 year  . Lichen sclerosus   . Osteoarthrosis, unspecified whether generalized or localized, unspecified site   . Low back pain   . Myocardial infarction ([place]) 2000  . SOB (shortness of breath)   . Pain     Several year Hx of pain/ SOB radiating to her neck  . Chest pain     EC Stress test 12/21/11 Low risk, no ischemia, normal EF  . Aortic heart murmur     aortic sclerosis w/out and significant stenosis  . Dyspnea     Past Surgical History  Procedure Laterality Date  . Mastectomy Right 1999  . Replacement total knee Right 11/2009  . Vesicovaginal fistula closure w/ tah  1969  . Appendectomy    . Hand surgery Bilateral     CTS and trigger finger on right and left    Current Outpatient Prescriptions  Medication Sig Dispense Refill  . aspirin 81 MG tablet Take 81 mg by mouth daily.    Marland Kitchen b complex vitamins tablet Take 1 tablet by mouth  daily.    . cetirizine (ZYRTEC) 10 MG tablet Take 10 mg by mouth daily.    . cholecalciferol (VITAMIN D) 1000 UNITS tablet Take 2,000 Units by mouth daily.     . furosemide (LASIX) 20 MG tablet Take 20 mg by mouth daily.     Marland Kitchen gabapentin (NEURONTIN) 300 MG capsule Take 600 mg by mouth at bedtime.     . isosorbide mononitrate (IMDUR) 30 MG 24 hr tablet Take 1 tablet (30 mg total) by mouth daily. 30 tablet 11  . losartan (COZAAR) 25 MG tablet Take 25 mg by mouth daily.     . metFORMIN (GLUCOPHAGE) 500 MG tablet Take 500 mg by mouth 2 (two) times daily with a meal.     . nitroGLYCERIN (NITROSTAT) 0.4 MG SL tablet Place 0.4 mg under the tongue every 5 (five) minutes as needed for chest pain.    .  potassium gluconate 595 MG TABS tablet Take 595 mg by mouth at bedtime.    Marland Kitchen zolpidem (AMBIEN) 5 MG tablet Take 2.5 mg by mouth at bedtime.     No current facility-administered medications for this visit.    Allergies:    Allergies  Allergen Reactions  . Benadryl [Diphenhydramine Hcl (Sleep)]   . Ciprofloxacin   . Lactose Intolerance (Gi)     DIARRHEA  . Macrobid [employer] Macro]   . Macrodantin [Nitrofurantoin Macrocrystal]   . Naproxen   . Penicillins   . Prednisone   . Quinine Sulfate [Quinine]   . Sulfa Antibiotics   . Tramadol     Social History:  The patient  reports that she has never smoked. She has never used smokeless tobacco. She reports that she does not drink alcohol or use illicit drugs.   ROS:  Please see the history of present illness.   Denies any fevers, chills, orthopnea, PND    PHYSICAL EXAM: VS:  BP 126/64 mmHg  Pulse 58  Ht 5\' 3"  (1.6 m)  Wt 152 lb (68.947 kg)  BMI 26.93 kg/m2 Well nourished, well developed, in no acute distress HEENT: normal Neck: no JVDradiation to carotids, murmur Cardiac:  normal S1, S2; RRR; 2/6 S murmurRUSB Lungs:  clear to auscultation bilaterally, no wheezing, rhonchi or rales Abd: soft, nontender, no hepatomegaly Ext: no edema Skin: warm and dry Neuro: no focal abnormalities noted  EKG:  Today 11/08/15-sinus bradycardia rate 58, poor R-wave progression old septal infarct pattern personally viewed-no change from prior 11/26/14-Sinus rhythm rate 72 with old septal infarct pattern, poor R wave progression   no change from prior EKG  Echocardiogram: 01/22/14 - Left ventricle: The cavity size was normal. Wall thickness was increased in a pattern of mild LVH. Systolic function was vigorous. The estimated ejection fraction was in the range of 65% to 70%. Wall motion was normal; there were no regional wall motion abnormalities. Doppler parameters are consistent with abnormal left ventricular  relaxation (grade 1 diastolic dysfunction). Doppler parameters are consistent with high ventricular filling pressure. - Aortic valve: Cusp separation was reduced. There was mild stenosis. Peak velocity: 259cm/s (S). Mean gradient: 32mm Hg (S). - Mitral valve: Moderately calcified annulus. - Left atrium: The atrium was mildly dilated.  Nuclear stress test 7/14-low risk, no ischemia   ASSESSMENT AND PLAN:  1. Old myocardial infarction-year 2000.  Last stress test in 06/2013 was reassuring. Low risk. Isosorbide 30 mg daily seems to be helping. No headache. She will occasionally taken nitroglycerin but she states that this burns the underside of  her tongue. 2. Angina-as described above. Question if neck/jaw discomfort is secondary to angina. Improved with isosorbide. No further chest pain. Occasional behind the ear pain. Continue to monitor. Discussed previously with her and her son. Had one episode of fleeting discomfort. Possibly musculoskeletal. Nonetheless, she has been quite stable with this. 3. Coronary artery disease- remote history in past. Last stress test low risk. Documented in medical record. Stable. 4. Diabetes-per Dr. Delfina Redwood. 5. Heart murmur-mild aortic stenosis. Discussed today. We will forego echocardiogram today. 6. We'll see back in 6 months.  Signed, Candee Furbish, MD [facility]  11/08/2015 10:09 AM

## 2015-11-08 NOTE — Patient Instructions (Signed)

## 2015-11-30 DIAGNOSIS — J069 Acute upper respiratory infection, unspecified: Secondary | ICD-10-CM | POA: Diagnosis not present

## 2015-12-15 DIAGNOSIS — J069 Acute upper respiratory infection, unspecified: Secondary | ICD-10-CM | POA: Diagnosis not present

## 2015-12-15 DIAGNOSIS — K5792 Diverticulitis of intestine, part unspecified, without perforation or abscess without bleeding: Secondary | ICD-10-CM | POA: Diagnosis not present

## 2016-01-18 DIAGNOSIS — M961 Postlaminectomy syndrome, not elsewhere classified: Secondary | ICD-10-CM | POA: Diagnosis not present

## 2016-01-18 DIAGNOSIS — G8929 Other chronic pain: Secondary | ICD-10-CM | POA: Diagnosis not present

## 2016-01-18 DIAGNOSIS — M533 Sacrococcygeal disorders, not elsewhere classified: Secondary | ICD-10-CM | POA: Diagnosis not present

## 2016-01-26 DIAGNOSIS — E1122 Type 2 diabetes mellitus with diabetic chronic kidney disease: Secondary | ICD-10-CM | POA: Diagnosis not present

## 2016-01-26 DIAGNOSIS — R197 Diarrhea, unspecified: Secondary | ICD-10-CM | POA: Diagnosis not present

## 2016-01-26 DIAGNOSIS — I10 Essential (primary) hypertension: Secondary | ICD-10-CM | POA: Diagnosis not present

## 2016-01-26 DIAGNOSIS — Z7984 Long term (current) use of oral hypoglycemic drugs: Secondary | ICD-10-CM | POA: Diagnosis not present

## 2016-01-26 DIAGNOSIS — E785 Hyperlipidemia, unspecified: Secondary | ICD-10-CM | POA: Diagnosis not present

## 2016-02-02 DIAGNOSIS — R197 Diarrhea, unspecified: Secondary | ICD-10-CM | POA: Diagnosis not present

## 2016-02-03 DIAGNOSIS — M533 Sacrococcygeal disorders, not elsewhere classified: Secondary | ICD-10-CM | POA: Diagnosis not present

## 2016-02-17 DIAGNOSIS — M533 Sacrococcygeal disorders, not elsewhere classified: Secondary | ICD-10-CM | POA: Diagnosis not present

## 2016-02-17 DIAGNOSIS — M961 Postlaminectomy syndrome, not elsewhere classified: Secondary | ICD-10-CM | POA: Diagnosis not present

## 2016-02-17 DIAGNOSIS — G8929 Other chronic pain: Secondary | ICD-10-CM | POA: Diagnosis not present

## 2016-03-02 DIAGNOSIS — M533 Sacrococcygeal disorders, not elsewhere classified: Secondary | ICD-10-CM | POA: Diagnosis not present

## 2016-03-02 DIAGNOSIS — G8929 Other chronic pain: Secondary | ICD-10-CM | POA: Diagnosis not present

## 2016-03-08 DIAGNOSIS — M533 Sacrococcygeal disorders, not elsewhere classified: Secondary | ICD-10-CM | POA: Diagnosis not present

## 2016-03-08 DIAGNOSIS — G8929 Other chronic pain: Secondary | ICD-10-CM | POA: Diagnosis not present

## 2016-03-10 DIAGNOSIS — G8929 Other chronic pain: Secondary | ICD-10-CM | POA: Diagnosis not present

## 2016-03-10 DIAGNOSIS — M533 Sacrococcygeal disorders, not elsewhere classified: Secondary | ICD-10-CM | POA: Diagnosis not present

## 2016-04-03 DIAGNOSIS — Z853 Personal history of malignant neoplasm of breast: Secondary | ICD-10-CM | POA: Diagnosis not present

## 2016-04-03 DIAGNOSIS — Z1231 Encounter for screening mammogram for malignant neoplasm of breast: Secondary | ICD-10-CM | POA: Diagnosis not present

## 2016-04-03 DIAGNOSIS — M8588 Other specified disorders of bone density and structure, other site: Secondary | ICD-10-CM | POA: Diagnosis not present

## 2016-04-03 DIAGNOSIS — Z6826 Body mass index (BMI) 26.0-26.9, adult: Secondary | ICD-10-CM | POA: Diagnosis not present

## 2016-04-03 DIAGNOSIS — Z124 Encounter for screening for malignant neoplasm of cervix: Secondary | ICD-10-CM | POA: Diagnosis not present

## 2016-04-03 DIAGNOSIS — M12852 Other specific arthropathies, not elsewhere classified, left hip: Secondary | ICD-10-CM | POA: Diagnosis not present

## 2016-04-27 ENCOUNTER — Other Ambulatory Visit: Payer: Self-pay | Admitting: Cardiology

## 2016-05-10 ENCOUNTER — Encounter: Payer: Self-pay | Admitting: Cardiology

## 2016-05-10 ENCOUNTER — Ambulatory Visit (INDEPENDENT_AMBULATORY_CARE_PROVIDER_SITE_OTHER): Payer: Medicare Other | Admitting: Cardiology

## 2016-05-10 VITALS — BP 138/64 | HR 70 | Ht 63.0 in | Wt 149.0 lb

## 2016-05-10 DIAGNOSIS — I208 Other forms of angina pectoris: Secondary | ICD-10-CM | POA: Diagnosis not present

## 2016-05-10 DIAGNOSIS — I350 Nonrheumatic aortic (valve) stenosis: Secondary | ICD-10-CM

## 2016-05-10 DIAGNOSIS — I2583 Coronary atherosclerosis due to lipid rich plaque: Secondary | ICD-10-CM

## 2016-05-10 DIAGNOSIS — I252 Old myocardial infarction: Secondary | ICD-10-CM

## 2016-05-10 DIAGNOSIS — I2510 Atherosclerotic heart disease of native coronary artery without angina pectoris: Secondary | ICD-10-CM | POA: Diagnosis not present

## 2016-05-10 NOTE — Patient Instructions (Addendum)
**Note De-identified Barbara Russell Obfuscation** Medication Instructions:  Same-no changes  Labwork: None  Testing/Procedures: Your physician has requested that you have an echocardiogram. Echocardiography is a painless test that uses sound waves to create images of your heart. It provides your doctor with information about the size and shape of your heart and how well your heart's chambers and valves are working. This procedure takes approximately one hour. There are no restrictions for this procedure.   Follow-Up: Your physician wants you to follow-up in: 6 months. You will receive a reminder letter in the mail two months in advance. If you don't receive a letter, please call our office to schedule the follow-up appointment.     If you need a refill on your cardiac medications before your next appointment, please call your pharmacy.   

## 2016-05-10 NOTE — Progress Notes (Signed)
[facility]. [address] [place], [place]  [address] Phone: [phone] Fax:  [phone]  Date:  05/10/2016   ID:  Barbara Russell, DOB [birth year], MRN [id number]  PCP:  Barbara Hams, MD   History of Present Illness: Barbara Russell is a 82 y.o. female who has a history of hypertension, diabetes, normal stress test in 06/2013 low risk with remote history of CAD documented in the past with myocardial infarction in the year 2000. She has been intolerant of statins.  She also had a several year history of pain/shortness of breath radiating to her neck that was described as moderate severity. At that time she did not wish to take Lasix. She was worried about worsening leg cramping.  Diarrhea for years, lactose intolerant. Main complaint. No hiatal hernia.  Saw my mother at wedding in [place].   Previously if goes up hill has pain that goes up to neck.Chasing dog feels same thing. Knitting same thing. Imdur has helped.   04/07/14 - Chest pain has gone away. Still occasionally he may feel some pain behind her ears but this is rare. She is doing very well with her medications. Echocardiogram showed mild aortic stenosis. Normal EF.  11/26/14-she will occasionally have neck pain when brushing after her dogs. Most of her complaints are orthopedic. She is not describing any significant change in shortness of breath. Doing well.  05/12/15 - Woke up felt sharp pain in head. 220/95 in night BP. Took her to ER. Dr. Delfina Russell looked at BP log. Eventually came back down. In the distant past she has some orthostatic hypotension. She's not having any chest pain, no shortness of breath. She had previously a right knee replacement.  11/08/15-overall doing well. She had a fleeting chest discomfort in the middle the night, resolved on its own. Occasionally she will have burning sensation with her nitroglycerin under her tongue.  05/10/16-overall doing well. Fleeting chest pain at times. Takes  nitroglycerin. She is not feeling any significant shortness of breath. She did have a fall on her flagstone. Hurt her lip but no significant bleeding. She still has a residual "bump" in that region. No syncope.  Wt Readings from Last 3 Encounters:  05/10/16 149 lb (67.586 kg)  11/08/15 152 lb (68.947 kg)  05/12/15 147 lb 12.8 oz (67.042 kg)     Past Medical History  Diagnosis Date  . Hypertension   . Hypercholesteremia   . Diabetes mellitus without complication ([place])   . Coronary artery disease     Remote Hx of CAD, normal stress test 2009  . Breast cancer ([place])     Hx of BC, s/p mastectomy on right 1999, s/p tamoxifen X1 year  . Lichen sclerosus   . Osteoarthrosis, unspecified whether generalized or localized, unspecified site   . Low back pain   . Myocardial infarction ([place]) 2000  . SOB (shortness of breath)   . Pain     Several year Hx of pain/ SOB radiating to her neck  . Chest pain     EC Stress test 12/21/11 Low risk, no ischemia, normal EF  . Aortic heart murmur     aortic sclerosis w/out and significant stenosis  . Dyspnea     Past Surgical History  Procedure Laterality Date  . Mastectomy Right 1999  . Replacement total knee Right 11/2009  . Vesicovaginal fistula closure w/ tah  1969  . Appendectomy    . Hand surgery Bilateral  CTS and trigger finger on right and left    Current Outpatient Prescriptions  Medication Sig Dispense Refill  . aspirin 81 MG tablet Take 81 mg by mouth daily.    Marland Kitchen b complex vitamins tablet Take 1 tablet by mouth daily.    . cetirizine (ZYRTEC) 10 MG tablet Take 10 mg by mouth daily.    . cholecalciferol (VITAMIN D) 1000 UNITS tablet Take 1,000 Units by mouth daily.     . furosemide (LASIX) 20 MG tablet Take 20 mg by mouth daily.     Marland Kitchen gabapentin (NEURONTIN) 300 MG capsule Take 600 mg by mouth at bedtime.     . isosorbide mononitrate (IMDUR) 30 MG 24 hr tablet TAKE 1 TABLET (30 MG TOTAL) BY MOUTH DAILY. 30 tablet 5  . losartan  (COZAAR) 25 MG tablet Take 25 mg by mouth daily.     . metFORMIN (GLUCOPHAGE) 500 MG tablet Take 500 mg by mouth 2 (two) times daily with a meal.     . nitroGLYCERIN (NITROSTAT) 0.4 MG SL tablet Place 0.4 mg under the tongue every 5 (five) minutes as needed for chest pain.    . potassium gluconate 595 MG TABS tablet Take 595 mg by mouth at bedtime.    Marland Kitchen zolpidem (AMBIEN) 5 MG tablet Take 2.5 mg by mouth at bedtime.     No current facility-administered medications for this visit.    Allergies:    Allergies  Allergen Reactions  . Benadryl [Diphenhydramine Hcl (Sleep)]     Does opposite affect. Wakes patient up  . Ciprofloxacin     welps  . Lactose Intolerance (Gi)     DIARRHEA  . Macrobid [Nitrofurantoin Monohyd Macro]     Nausea vommitting  . Macrodantin [Nitrofurantoin Macrocrystal]     Nausea  vommiting  . Naproxen     Stomach pain  . Quinine Sulfate [Quinine]     welps  . Sulfa Antibiotics     welps   . Tramadol     unknown to patient  . Penicillins Rash  . Prednisone     pain    Social History:  The patient  reports that she has never smoked. She has never used smokeless tobacco. She reports that she does not drink alcohol or use illicit drugs.   ROS:  Please see the history of present illness.   Denies any fevers, chills, orthopnea, PND    PHYSICAL EXAM: VS:  BP 138/64 mmHg  Pulse 70  Ht 5\' 3"  (1.6 m)  Wt 149 lb (67.586 kg)  BMI 26.40 kg/m2 Well nourished, well developed, in no acute distress HEENT: normal Neck: no JVDradiation to carotids, murmur Cardiac:  normal S1, S2; RRR; 2/6 crescendo decrescendo S murmurRUSB Lungs:  clear to auscultation bilaterally, no wheezing, rhonchi or rales Abd: soft, nontender, no hepatomegaly Ext: no edema Skin: warm and dry Neuro: no focal abnormalities noted  EKG:  Today 11/08/15-sinus bradycardia rate 58, poor R-wave progression old septal infarct pattern personally viewed-no change from prior 11/26/14-Sinus rhythm rate  72 with old septal infarct pattern, poor R wave progression   no change from prior EKG  Echocardiogram: 01/22/14 - Left ventricle: The cavity size was normal. Wall thickness was increased in a pattern of mild LVH. Systolic function was vigorous. The estimated ejection fraction was in the range of 65% to 70%. Wall motion was normal; there were no regional wall motion abnormalities. Doppler parameters are consistent with abnormal left ventricular relaxation (grade 1 diastolic dysfunction). Doppler parameters  are consistent with high ventricular filling pressure. - Aortic valve: Cusp separation was reduced. There was mild stenosis. Peak velocity: 259cm/s (S). Mean gradient: 22mm Hg (S). - Mitral valve: Moderately calcified annulus. - Left atrium: The atrium was mildly dilated.  Nuclear stress test 7/14-low risk, no ischemia   ASSESSMENT AND PLAN:  1. Old myocardial infarction-year 2000.  Last stress test in 06/2013 was reassuring. Low risk. Isosorbide 30 mg daily seems to be helping. No headache. She will occasionally taken nitroglycerin. This seems to help. 2. Angina-as described above. Question if neck/jaw discomfort is secondary to angina. Improved with isosorbide. No further chest pain. Occasional behind the ear pain. Continue to monitor. Discussed previously with her and her son. Had one episode of fleeting discomfort. Possibly musculoskeletal. Nonetheless, she has been quite stable with this. Last stress test reassuring. 3. Coronary artery disease- remote history in past. Last stress test low risk. Documented in medical record. Stable. 4. Diabetes-per Dr. Delfina Russell. 5. Mild aortic stenosis. Discussed today. Echocardiogram ordered today. Crescendo, decrescendo murmur. 6. We'll see back in 6 months.  Signed, Candee Furbish, MD [facility]  05/10/2016 10:05 AM

## 2016-05-18 DIAGNOSIS — L989 Disorder of the skin and subcutaneous tissue, unspecified: Secondary | ICD-10-CM | POA: Diagnosis not present

## 2016-05-18 DIAGNOSIS — W19XXXA Unspecified fall, initial encounter: Secondary | ICD-10-CM | POA: Diagnosis not present

## 2016-05-25 DIAGNOSIS — N390 Urinary tract infection, site not specified: Secondary | ICD-10-CM | POA: Diagnosis not present

## 2016-05-30 ENCOUNTER — Other Ambulatory Visit (HOSPITAL_COMMUNITY): Payer: Medicare Other

## 2016-06-19 DIAGNOSIS — I10 Essential (primary) hypertension: Secondary | ICD-10-CM | POA: Diagnosis not present

## 2016-06-20 ENCOUNTER — Telehealth: Payer: Self-pay | Admitting: Cardiology

## 2016-06-20 NOTE — Telephone Encounter (Signed)
New message  Pt c/o medication issue:  1. Name of Medication: Isosorbide and Amlodipine   2. How are you currently taking this medication (dosage and times per day)? 30mg  and 2.5mg    3. Are you having a reaction (difficulty breathing--STAT)? Pt states she has not taken them togehter  4. What is your medication issue? Pt called requesting to speak with RN. Pt states she was prescribed Amlodipine on 7/10 and is concerned with taking this med with the isosorbide. Please call back to discuss

## 2016-06-20 NOTE — Telephone Encounter (Signed)
Pt calling today B/C she saw the PA at her PCP office for lightheadedness.  Pt reports she has been having lightheadedness for a few weeks and her BP had been low (103/52) when she checked it.  She stopped her losartan herself and stopped having the lightheadedness.  She had been keeping a BP diary at home and took that with her to the above mentioned appt.  She states her BP had stayed in the range of 130-140/56-66.  Based on those readings she was instructed to start and given a RX for Amlodipine.  Pt is "afraid" to start it because of what she read about it on the Internet.  Advised pt she should continue to monitor her BP at home and keep a diary.  She should call her PCP office back if her BP is above 140 consistently and keep her appt with Dr Delfina Redwood in 2 weeks.  She is in agreement and will c/b with any other questions or concerns.

## 2016-06-26 ENCOUNTER — Ambulatory Visit (HOSPITAL_COMMUNITY): Payer: Medicare Other | Attending: Cardiology

## 2016-06-26 ENCOUNTER — Other Ambulatory Visit: Payer: Self-pay

## 2016-06-26 DIAGNOSIS — I252 Old myocardial infarction: Secondary | ICD-10-CM | POA: Diagnosis not present

## 2016-06-26 DIAGNOSIS — I340 Nonrheumatic mitral (valve) insufficiency: Secondary | ICD-10-CM | POA: Insufficient documentation

## 2016-06-26 DIAGNOSIS — I350 Nonrheumatic aortic (valve) stenosis: Secondary | ICD-10-CM

## 2016-06-26 DIAGNOSIS — I119 Hypertensive heart disease without heart failure: Secondary | ICD-10-CM | POA: Diagnosis not present

## 2016-06-26 DIAGNOSIS — I2510 Atherosclerotic heart disease of native coronary artery without angina pectoris: Secondary | ICD-10-CM | POA: Diagnosis not present

## 2016-06-26 DIAGNOSIS — E785 Hyperlipidemia, unspecified: Secondary | ICD-10-CM | POA: Diagnosis not present

## 2016-07-05 ENCOUNTER — Telehealth: Payer: Self-pay | Admitting: *Deleted

## 2016-07-05 DIAGNOSIS — I10 Essential (primary) hypertension: Secondary | ICD-10-CM | POA: Diagnosis not present

## 2016-07-05 DIAGNOSIS — N390 Urinary tract infection, site not specified: Secondary | ICD-10-CM | POA: Diagnosis not present

## 2016-07-05 NOTE — Telephone Encounter (Signed)
Pt notified of echo results and findings by phone with verbal understanding. 

## 2016-07-24 DIAGNOSIS — N390 Urinary tract infection, site not specified: Secondary | ICD-10-CM | POA: Diagnosis not present

## 2016-08-03 DIAGNOSIS — E1122 Type 2 diabetes mellitus with diabetic chronic kidney disease: Secondary | ICD-10-CM | POA: Diagnosis not present

## 2016-08-03 DIAGNOSIS — I350 Nonrheumatic aortic (valve) stenosis: Secondary | ICD-10-CM | POA: Diagnosis not present

## 2016-08-03 DIAGNOSIS — Z1389 Encounter for screening for other disorder: Secondary | ICD-10-CM | POA: Diagnosis not present

## 2016-08-03 DIAGNOSIS — I2510 Atherosclerotic heart disease of native coronary artery without angina pectoris: Secondary | ICD-10-CM | POA: Diagnosis not present

## 2016-08-03 DIAGNOSIS — Z7984 Long term (current) use of oral hypoglycemic drugs: Secondary | ICD-10-CM | POA: Diagnosis not present

## 2016-08-03 DIAGNOSIS — E7800 Pure hypercholesterolemia, unspecified: Secondary | ICD-10-CM | POA: Diagnosis not present

## 2016-08-03 DIAGNOSIS — Z0000 Encounter for general adult medical examination without abnormal findings: Secondary | ICD-10-CM | POA: Diagnosis not present

## 2016-08-03 DIAGNOSIS — I10 Essential (primary) hypertension: Secondary | ICD-10-CM | POA: Diagnosis not present

## 2016-08-03 DIAGNOSIS — N390 Urinary tract infection, site not specified: Secondary | ICD-10-CM | POA: Diagnosis not present

## 2016-08-29 DIAGNOSIS — M65342 Trigger finger, left ring finger: Secondary | ICD-10-CM | POA: Diagnosis not present

## 2016-09-25 DIAGNOSIS — L723 Sebaceous cyst: Secondary | ICD-10-CM | POA: Diagnosis not present

## 2016-09-26 ENCOUNTER — Other Ambulatory Visit: Payer: Self-pay | Admitting: Obstetrics and Gynecology

## 2016-09-26 DIAGNOSIS — M65342 Trigger finger, left ring finger: Secondary | ICD-10-CM | POA: Diagnosis not present

## 2016-09-26 DIAGNOSIS — L723 Sebaceous cyst: Secondary | ICD-10-CM

## 2016-10-02 ENCOUNTER — Ambulatory Visit
Admission: RE | Admit: 2016-10-02 | Discharge: 2016-10-02 | Disposition: A | Discharge status: Home / Self Care | Payer: Medicare Other | Source: Ambulatory Visit | Attending: Obstetrics and Gynecology | Admitting: Obstetrics and Gynecology

## 2016-10-02 DIAGNOSIS — R2231 Localized swelling, mass and lump, right upper limb: Secondary | ICD-10-CM | POA: Diagnosis not present

## 2016-10-02 DIAGNOSIS — L723 Sebaceous cyst: Secondary | ICD-10-CM

## 2016-10-02 DIAGNOSIS — N6310 Unspecified lump in the right breast, unspecified quadrant: Secondary | ICD-10-CM | POA: Diagnosis not present

## 2016-10-23 DIAGNOSIS — L720 Epidermal cyst: Secondary | ICD-10-CM | POA: Diagnosis not present

## 2016-10-31 DIAGNOSIS — H04123 Dry eye syndrome of bilateral lacrimal glands: Secondary | ICD-10-CM | POA: Diagnosis not present

## 2016-11-07 ENCOUNTER — Encounter: Payer: Self-pay | Admitting: Cardiology

## 2016-11-10 ENCOUNTER — Other Ambulatory Visit: Payer: Self-pay | Admitting: Cardiology

## 2016-11-14 ENCOUNTER — Encounter (INDEPENDENT_AMBULATORY_CARE_PROVIDER_SITE_OTHER): Payer: Self-pay

## 2016-11-14 ENCOUNTER — Encounter: Payer: Self-pay | Admitting: Cardiology

## 2016-11-14 ENCOUNTER — Ambulatory Visit (INDEPENDENT_AMBULATORY_CARE_PROVIDER_SITE_OTHER): Payer: Medicare Other | Admitting: Cardiology

## 2016-11-14 VITALS — BP 136/74 | HR 64 | Ht 62.0 in | Wt 143.4 lb

## 2016-11-14 DIAGNOSIS — I2510 Atherosclerotic heart disease of native coronary artery without angina pectoris: Secondary | ICD-10-CM | POA: Diagnosis not present

## 2016-11-14 DIAGNOSIS — I208 Other forms of angina pectoris: Secondary | ICD-10-CM | POA: Diagnosis not present

## 2016-11-14 DIAGNOSIS — I350 Nonrheumatic aortic (valve) stenosis: Secondary | ICD-10-CM

## 2016-11-14 DIAGNOSIS — I2583 Coronary atherosclerosis due to lipid rich plaque: Secondary | ICD-10-CM | POA: Diagnosis not present

## 2016-11-14 DIAGNOSIS — I252 Old myocardial infarction: Secondary | ICD-10-CM | POA: Diagnosis not present

## 2016-11-14 NOTE — Progress Notes (Signed)
[facility]. [address] [place], [place]  [address] Phone: [phone] Fax:  [phone]  Date:  11/14/2016   ID:  Barbara Russell, DOB January 23, 1930, MRN [id number]  PCP:  Kandice Hams, MD   History of Present Illness: Barbara Russell is a 82 y.o. female who has a history of hypertension, diabetes, normal stress test in 06/2013 low risk with remote history of CAD documented in the past with myocardial infarction in the year 2000. She has been intolerant of statins.  She also had a several year history of pain/shortness of breath radiating to her neck that was described as moderate severity. At that time she did not wish to take Lasix. She was worried about worsening leg cramping.  Diarrhea for years, lactose intolerant. Main complaint. No hiatal hernia.  Saw my mother at wedding in [place].   Previously if goes up hill has pain that goes up to neck.Chasing dog feels same thing. Knitting same thing. Imdur has helped.   04/07/14 - Chest pain has gone away. Still occasionally he may feel some pain behind her ears but this is rare. She is doing very well with her medications. Echocardiogram showed mild aortic stenosis. Normal EF.  11/26/14-she will occasionally have neck pain when brushing after her dogs. Most of her complaints are orthopedic. She is not describing any significant change in shortness of breath. Doing well.  05/12/15 - Woke up felt sharp pain in head. 220/95 in night BP. Took her to ER. Dr. Delfina Redwood looked at BP log. Eventually came back down. In the distant past she has some orthostatic hypotension. She's not having any chest pain, no shortness of breath. She had previously a right knee replacement.  11/08/15-overall doing well. She had a fleeting chest discomfort in the middle the night, resolved on its own. Occasionally she will have burning sensation with her nitroglycerin under her tongue.  05/10/16-overall doing well. Fleeting chest pain at times. Takes  nitroglycerin. She is not feeling any significant shortness of breath. She did have a fall on her flagstone. Hurt her lip but no significant bleeding. She still has a residual "bump" in that region. No syncope.  Wt Readings from Last 3 Encounters:  11/14/16 143 lb 6.4 oz (65 kg)  05/10/16 149 lb (67.6 kg)  11/08/15 152 lb (68.9 kg)     Past Medical History:  Diagnosis Date  . Aortic heart murmur    aortic sclerosis w/out and significant stenosis  . Breast cancer ([facility])    Hx of BC, s/p mastectomy on right 1999, s/p tamoxifen X1 year  . Chest pain    EC Stress test 12/21/11 Low risk, no ischemia, normal EF  . Coronary artery disease    Remote Hx of CAD, normal stress test 2009  . Diabetes mellitus without complication ([facility])   . Dyspnea   . Hypercholesteremia   . Hypertension   . Lichen sclerosus   . Low back pain   . Myocardial infarction 2000  . Osteoarthrosis, unspecified whether generalized or localized, unspecified site   . Pain    Several year Hx of pain/ SOB radiating to her neck  . SOB (shortness of breath)     Past Surgical History:  Procedure Laterality Date  . APPENDECTOMY    . HAND SURGERY Bilateral    CTS and trigger finger on right and left  . MASTECTOMY Right 1999  . REPLACEMENT TOTAL KNEE Right 11/2009  . VESICOVAGINAL FISTULA CLOSURE W/ TAH  1969    Current Outpatient Prescriptions  Medication Sig Dispense Refill  . aspirin 81 MG tablet Take 81 mg by mouth daily.    Marland Kitchen b complex vitamins tablet Take 1 tablet by mouth daily.    . cetirizine (ZYRTEC) 10 MG tablet Take 10 mg by mouth daily.    . cholecalciferol (VITAMIN D) 1000 UNITS tablet Take 1,000 Units by mouth daily.     . furosemide (LASIX) 20 MG tablet Take 20 mg by mouth daily.     Marland Kitchen gabapentin (NEURONTIN) 300 MG capsule Take 600 mg by mouth at bedtime.     . isosorbide mononitrate (IMDUR) 30 MG 24 hr tablet TAKE 1 TABLET (30 MG TOTAL) BY MOUTH DAILY. 30 tablet 5  . losartan (COZAAR) 25 MG tablet  Take 25 mg by mouth daily.     . metFORMIN (GLUCOPHAGE) 500 MG tablet Take 500 mg by mouth 2 (two) times daily with a meal.     . nitroGLYCERIN (NITROSTAT) 0.4 MG SL tablet Place 0.4 mg under the tongue every 5 (five) minutes as needed for chest pain.    . Omega-3 Fatty Acids (FISH OIL) 1200 MG CAPS Take 1,200 mg by mouth daily.    . potassium gluconate 595 MG TABS tablet Take 595 mg by mouth at bedtime.    Marland Kitchen zolpidem (AMBIEN) 5 MG tablet Take 2.5 mg by mouth at bedtime.     No current facility-administered medications for this visit.     Allergies:    Allergies  Allergen Reactions  . Benadryl [Diphenhydramine Hcl (Sleep)]     Does opposite affect. Wakes patient up  . Ciprofloxacin     welps  . Lactose Intolerance (Gi)     DIARRHEA  . Macrobid [Nitrofurantoin Monohyd Macro]     Nausea vommitting  . Macrodantin [Nitrofurantoin Macrocrystal]     Nausea  vommiting  . Naproxen     Stomach pain  . Quinine Sulfate [Quinine]     welps  . Sulfa Antibiotics     welps   . Tramadol     unknown to patient  . Penicillins Rash  . Prednisone     pain    Social History:  The patient  reports that she has never smoked. She has never used smokeless tobacco. She reports that she does not drink alcohol or use drugs.   ROS:  Please see the history of present illness.   Denies any fevers, chills, orthopnea, PND    PHYSICAL EXAM: VS:  BP 136/74   Pulse 64   Ht 5\' 2"  (1.575 m)   Wt 143 lb 6.4 oz (65 kg)   LMP  (LMP Unknown)   BMI 26.23 kg/m  Well nourished, well developed, in no acute distress HEENT: normal Neck: no JVDradiation to carotids, murmur Cardiac:  normal S1, S2; RRR; 2/6 crescendo decrescendo S murmurRUSB Lungs:  clear to auscultation bilaterally, no wheezing, rhonchi or rales Abd: soft, nontender, no hepatomegaly Ext: no edema Skin: warm and dry Neuro: no focal abnormalities noted  EKG:  Today 11/14/16-sinus rhythm, 64, poor R-wave progression consistent with old  anteroseptal infarct pattern personally viewed no significant change from prior- 11/08/15-sinus bradycardia rate 58, poor R-wave progression old septal infarct pattern personally viewed-no change from prior 11/26/14-Sinus rhythm rate 72 with old septal infarct pattern, poor R wave progression   no change from prior EKG  Echocardiogram: 01/22/14 - Left ventricle: The cavity size was normal. Wall thickness was increased in a pattern of mild LVH.  Systolic function was vigorous. The estimated ejection fraction was in the range of 65% to 70%. Wall motion was normal; there were no regional wall motion abnormalities. Doppler parameters are consistent with abnormal left ventricular relaxation (grade 1 diastolic dysfunction). Doppler parameters are consistent with high ventricular filling pressure. - Aortic valve: Cusp separation was reduced. There was mild stenosis. Peak velocity: 259cm/s (S). Mean gradient: 31mm Hg (S). - Mitral valve: Moderately calcified annulus. - Left atrium: The atrium was mildly dilated.  Nuclear stress test 7/14-low risk, no ischemia   ECHO 06/26/16: - Left ventricle: The cavity size was normal. There was moderate   focal basal and mild concentric hypertrophy. Systolic function   was normal. The estimated ejection fraction was in the range of   60% to 65%. Features are consistent with a pseudonormal left   ventricular filling pattern, with concomitant abnormal relaxation   and increased filling pressure (grade 2 diastolic dysfunction).   Doppler parameters are consistent with high ventricular filling   pressure. - Aortic valve: Severe diffuse thickening and calcification. Valve   mobility was restricted. There was mild stenosis. - Mitral valve: Severely calcified annulus. There was mild   regurgitation. - Atrial septum: There was increased thickness of the septum,   consistent with lipomatous hypertrophy.  ASSESSMENT AND PLAN:  1. Old myocardial  infarction-year 2000.  Last stress test in 06/2013 was reassuring. Low risk. Isosorbide 30 mg daily seems to be helping. No headache. She will occasionally taken nitroglycerin but this is very rare. This seems to help. If she wakes up in the night with chest discomfort, she may drink a glass of water and it usually goes away. She has not had this in quite some time. We discussed the possibility of statin medication once again, she did have some myalgias previously. We decided not to pursue. 2. Angina-as described above. Question if neck/jaw discomfort is secondary to angina. Improved with isosorbide. No further chest pain. Occasional behind the ear pain. Continue to monitor. Discussed previously with her and her son. Had one episode of fleeting discomfort. Possibly musculoskeletal. Nonetheless, she has been quite stable with this. Last stress test reassuring. EF reassuring 3. Coronary artery disease- remote history in past. Last stress test low risk. Documented in medical record. Stable. 4. Diabetes-per Dr. Delfina Redwood. 5. Mild aortic stenosis. Discussed today. Echocardiogram discussed. Crescendo, decrescendo murmur. 6. We'll see back in 6 months.  Signed, Candee Furbish, MD [facility]  11/14/2016 10:26 AM

## 2016-11-14 NOTE — Patient Instructions (Signed)
Medication Instructions:  Your physician recommends that you continue on your current medications as directed. Please refer to the Current Medication list given to you today.   Labwork: none  Testing/Procedures: none  Follow-Up: Your physician wants you to follow-up in: 6 months with Dr Marlou Porch. (June 2018).  You will receive a reminder letter in the mail two months in advance. If you don't receive a letter, please call our office to schedule the follow-up appointment.        If you need a refill on your cardiac medications before your next appointment, please call your pharmacy.

## 2016-12-06 DIAGNOSIS — H04123 Dry eye syndrome of bilateral lacrimal glands: Secondary | ICD-10-CM | POA: Diagnosis not present

## 2016-12-12 DIAGNOSIS — I2510 Atherosclerotic heart disease of native coronary artery without angina pectoris: Secondary | ICD-10-CM | POA: Diagnosis not present

## 2016-12-12 DIAGNOSIS — I10 Essential (primary) hypertension: Secondary | ICD-10-CM | POA: Diagnosis not present

## 2016-12-12 DIAGNOSIS — Z7984 Long term (current) use of oral hypoglycemic drugs: Secondary | ICD-10-CM | POA: Diagnosis not present

## 2016-12-12 DIAGNOSIS — E1122 Type 2 diabetes mellitus with diabetic chronic kidney disease: Secondary | ICD-10-CM | POA: Diagnosis not present

## 2016-12-21 DIAGNOSIS — H04123 Dry eye syndrome of bilateral lacrimal glands: Secondary | ICD-10-CM | POA: Diagnosis not present

## 2017-01-31 DIAGNOSIS — E7800 Pure hypercholesterolemia, unspecified: Secondary | ICD-10-CM | POA: Diagnosis not present

## 2017-01-31 DIAGNOSIS — I10 Essential (primary) hypertension: Secondary | ICD-10-CM | POA: Diagnosis not present

## 2017-01-31 DIAGNOSIS — I2510 Atherosclerotic heart disease of native coronary artery without angina pectoris: Secondary | ICD-10-CM | POA: Diagnosis not present

## 2017-01-31 DIAGNOSIS — E1122 Type 2 diabetes mellitus with diabetic chronic kidney disease: Secondary | ICD-10-CM | POA: Diagnosis not present

## 2017-01-31 DIAGNOSIS — Z7984 Long term (current) use of oral hypoglycemic drugs: Secondary | ICD-10-CM | POA: Diagnosis not present

## 2017-02-23 DIAGNOSIS — M533 Sacrococcygeal disorders, not elsewhere classified: Secondary | ICD-10-CM | POA: Diagnosis not present

## 2017-02-23 DIAGNOSIS — M961 Postlaminectomy syndrome, not elsewhere classified: Secondary | ICD-10-CM | POA: Diagnosis not present

## 2017-02-23 DIAGNOSIS — G8929 Other chronic pain: Secondary | ICD-10-CM | POA: Diagnosis not present

## 2017-03-27 DIAGNOSIS — M961 Postlaminectomy syndrome, not elsewhere classified: Secondary | ICD-10-CM | POA: Diagnosis not present

## 2017-04-03 DIAGNOSIS — M533 Sacrococcygeal disorders, not elsewhere classified: Secondary | ICD-10-CM | POA: Diagnosis not present

## 2017-04-03 DIAGNOSIS — G8929 Other chronic pain: Secondary | ICD-10-CM | POA: Diagnosis not present

## 2017-04-04 DIAGNOSIS — Z1231 Encounter for screening mammogram for malignant neoplasm of breast: Secondary | ICD-10-CM | POA: Diagnosis not present

## 2017-04-04 DIAGNOSIS — Z6827 Body mass index (BMI) 27.0-27.9, adult: Secondary | ICD-10-CM | POA: Diagnosis not present

## 2017-04-04 DIAGNOSIS — Z853 Personal history of malignant neoplasm of breast: Secondary | ICD-10-CM | POA: Diagnosis not present

## 2017-04-04 DIAGNOSIS — Z01419 Encounter for gynecological examination (general) (routine) without abnormal findings: Secondary | ICD-10-CM | POA: Diagnosis not present

## 2017-04-05 DIAGNOSIS — G8929 Other chronic pain: Secondary | ICD-10-CM | POA: Diagnosis not present

## 2017-04-05 DIAGNOSIS — M533 Sacrococcygeal disorders, not elsewhere classified: Secondary | ICD-10-CM | POA: Diagnosis not present

## 2017-04-06 ENCOUNTER — Other Ambulatory Visit: Payer: Self-pay | Admitting: Cardiology

## 2017-04-10 DIAGNOSIS — G8929 Other chronic pain: Secondary | ICD-10-CM | POA: Diagnosis not present

## 2017-04-10 DIAGNOSIS — M533 Sacrococcygeal disorders, not elsewhere classified: Secondary | ICD-10-CM | POA: Diagnosis not present

## 2017-04-12 DIAGNOSIS — M533 Sacrococcygeal disorders, not elsewhere classified: Secondary | ICD-10-CM | POA: Diagnosis not present

## 2017-04-12 DIAGNOSIS — G8929 Other chronic pain: Secondary | ICD-10-CM | POA: Diagnosis not present

## 2017-04-16 DIAGNOSIS — M533 Sacrococcygeal disorders, not elsewhere classified: Secondary | ICD-10-CM | POA: Diagnosis not present

## 2017-04-16 DIAGNOSIS — G8929 Other chronic pain: Secondary | ICD-10-CM | POA: Diagnosis not present

## 2017-04-24 DIAGNOSIS — G8929 Other chronic pain: Secondary | ICD-10-CM | POA: Diagnosis not present

## 2017-04-24 DIAGNOSIS — M533 Sacrococcygeal disorders, not elsewhere classified: Secondary | ICD-10-CM | POA: Diagnosis not present

## 2017-04-25 DIAGNOSIS — H04123 Dry eye syndrome of bilateral lacrimal glands: Secondary | ICD-10-CM | POA: Diagnosis not present

## 2017-04-30 DIAGNOSIS — G8929 Other chronic pain: Secondary | ICD-10-CM | POA: Diagnosis not present

## 2017-04-30 DIAGNOSIS — M961 Postlaminectomy syndrome, not elsewhere classified: Secondary | ICD-10-CM | POA: Diagnosis not present

## 2017-04-30 DIAGNOSIS — M533 Sacrococcygeal disorders, not elsewhere classified: Secondary | ICD-10-CM | POA: Diagnosis not present

## 2017-05-16 ENCOUNTER — Encounter (INDEPENDENT_AMBULATORY_CARE_PROVIDER_SITE_OTHER): Payer: Self-pay

## 2017-05-16 ENCOUNTER — Encounter: Payer: Self-pay | Admitting: Cardiology

## 2017-05-16 ENCOUNTER — Ambulatory Visit (INDEPENDENT_AMBULATORY_CARE_PROVIDER_SITE_OTHER): Payer: Medicare Other | Admitting: Cardiology

## 2017-05-16 VITALS — BP 100/60 | HR 76 | Ht 62.5 in | Wt 145.0 lb

## 2017-05-16 DIAGNOSIS — I2510 Atherosclerotic heart disease of native coronary artery without angina pectoris: Secondary | ICD-10-CM

## 2017-05-16 DIAGNOSIS — I2089 Other forms of angina pectoris: Secondary | ICD-10-CM

## 2017-05-16 DIAGNOSIS — I252 Old myocardial infarction: Secondary | ICD-10-CM

## 2017-05-16 DIAGNOSIS — I2583 Coronary atherosclerosis due to lipid rich plaque: Secondary | ICD-10-CM

## 2017-05-16 DIAGNOSIS — I208 Other forms of angina pectoris: Secondary | ICD-10-CM | POA: Diagnosis not present

## 2017-05-16 DIAGNOSIS — I350 Nonrheumatic aortic (valve) stenosis: Secondary | ICD-10-CM | POA: Diagnosis not present

## 2017-05-16 NOTE — Progress Notes (Signed)
[facility]. [address], [address], [place]  [address] Phone: [phone] Fax:  [phone]  Date:  05/16/2017   ID:  Barbara Russell, DOB Jun 28, 1930, MRN [id number]  PCP:  Seward Carol, MD   History of Present Illness: Barbara Russell is a 83 y.o. female who has a history of hypertension, diabetes, normal stress test in 06/2013 low risk with remote history of CAD documented in the past with myocardial infarction in the year 2000. She has been intolerant of statins.  She also had a several year history of pain/shortness of breath radiating to her neck that was described as moderate severity. At that time she did not wish to take Lasix. She was worried about worsening leg cramping.  Diarrhea for years, lactose intolerant. Main complaint. No hiatal hernia.  Saw my mother at wedding in [place].   Previously if goes up hill has pain that goes up to neck.Chasing dog feels same thing. Knitting same thing. Imdur has helped.   04/07/14 - Chest pain has gone away. Still occasionally he may feel some pain behind her ears but this is rare. She is doing very well with her medications. Echocardiogram showed mild aortic stenosis. Normal EF.  11/26/14-she will occasionally have neck pain when brushing after her dogs. Most of her complaints are orthopedic. She is not describing any significant change in shortness of breath. Doing well.  05/12/15 - Woke up felt sharp pain in head. 220/95 in night BP. Took her to ER. Dr. Delfina Redwood looked at BP log. Eventually came back down. In the distant past she has some orthostatic hypotension. She's not having any chest pain, no shortness of breath. She had previously a right knee replacement.  11/08/15-overall doing well. She had a fleeting chest discomfort in the middle the night, resolved on its own. Occasionally she will have burning sensation with her nitroglycerin under her tongue.  05/10/16-overall doing well. Fleeting chest pain at times. Takes  nitroglycerin. She is not feeling any significant shortness of breath. She did have a fall on her flagstone. Hurt her lip but no significant bleeding. She still has a residual "bump" in that region. No syncope.  05/16/17 - week ago took NTG x 2, elephant on chest. Non exertional. This happened at night. Now having back pain, Dr. Nelva Bush. Trying Dexamethasone cream on patch and this helps. No shortness of breath, no syncope, no bleeding. Barbara Russell is her grandson  Wt Readings from Last 3 Encounters:  05/16/17 145 lb (65.8 kg)  11/14/16 143 lb 6.4 oz (65 kg)  05/10/16 149 lb (67.6 kg)     Past Medical History:  Diagnosis Date  . Aortic heart murmur    aortic sclerosis w/out and significant stenosis  . Breast cancer ([place])    Hx of BC, s/p mastectomy on right 1999, s/p tamoxifen X1 year  . Chest pain    EC Stress test 12/21/11 Low risk, no ischemia, normal EF  . Coronary artery disease    Remote Hx of CAD, normal stress test 2009  . Diabetes mellitus without complication ([facility])   . Dyspnea   . Hypercholesteremia   . Hypertension   . Lichen sclerosus   . Low back pain   . Myocardial infarction ([facility]) 2000  . Osteoarthrosis, unspecified whether generalized or localized, unspecified site   . Pain    Several year Hx of pain/ SOB radiating to her neck  . SOB (shortness of breath)     Past Surgical  History:  Procedure Laterality Date  . APPENDECTOMY    . HAND SURGERY Bilateral    CTS and trigger finger on right and left  . MASTECTOMY Right 1999  . REPLACEMENT TOTAL KNEE Right 11/2009  . VESICOVAGINAL FISTULA CLOSURE W/ TAH  1969    Current Outpatient Prescriptions  Medication Sig Dispense Refill  . aspirin 81 MG tablet Take 81 mg by mouth daily.    Marland Kitchen b complex vitamins tablet Take 1 tablet by mouth daily.    . cetirizine (ZYRTEC) 10 MG tablet Take 10 mg by mouth daily.    . cholecalciferol (VITAMIN D) 1000 UNITS tablet Take 1,000 Units by mouth daily.     Marland Kitchen Dexamethasone Sodium Phosphate  POWD Apply 1 patch topically daily. For four hours then remove    . furosemide (LASIX) 20 MG tablet Take 20 mg by mouth daily.     Marland Kitchen gabapentin (NEURONTIN) 300 MG capsule Take 600 mg by mouth at bedtime.     . isosorbide mononitrate (IMDUR) 30 MG 24 hr tablet TAKE 1 TABLET (30 MG TOTAL) BY MOUTH DAILY. 30 tablet 2  . losartan (COZAAR) 25 MG tablet Take 25 mg by mouth daily.     . metFORMIN (GLUCOPHAGE) 500 MG tablet Take 500 mg by mouth daily at 12 noon.    . nitroGLYCERIN (NITROSTAT) 0.4 MG SL tablet Place 0.4 mg under the tongue every 5 (five) minutes as needed for chest pain.    . Omega-3 Fatty Acids (FISH OIL) 1200 MG CAPS Take 1,200 mg by mouth daily.    . potassium gluconate 595 MG TABS tablet Take 595 mg by mouth at bedtime.    Marland Kitchen zolpidem (AMBIEN) 5 MG tablet Take 2.5 mg by mouth at bedtime.     No current facility-administered medications for this visit.     Allergies:    Allergies  Allergen Reactions  . Benadryl [Diphenhydramine Hcl (Sleep)]     Does opposite affect. Wakes patient up  . Ciprofloxacin     welps  . Lactose Intolerance (Gi)     DIARRHEA  . Macrobid [Nitrofurantoin Monohyd Macro]     Nausea vommitting  . Macrodantin [Nitrofurantoin Macrocrystal]     Nausea  vommiting  . Naproxen     Stomach pain  . Quinine Sulfate [Quinine]     welps  . Sulfa Antibiotics     welps   . Tramadol     unknown to patient  . Penicillins Rash  . Prednisone     pain    Social History:  The patient  reports that she has never smoked. She has never used smokeless tobacco. She reports that she does not drink alcohol or use drugs.   ROS:  Please see the history of present illness.   Denies any fevers, chills, orthopnea, PND    PHYSICAL EXAM: VS:  BP 100/60   Pulse 76   Ht 5' 2.5" (1.588 m)   Wt 145 lb (65.8 kg)   LMP  (LMP Unknown)   BMI 26.10 kg/m  GEN: Well nourished, well developed, in no acute distress  HEENT: normal  Neck: no JVD, carotid bruits, or  masses Cardiac: RRR; 2/6 systolic murmur, no rubs, or gallops,no edema  Respiratory:  clear to auscultation bilaterally, normal work of breathing GI: soft, nontender, nondistended, + BS MS: no deformity or atrophy  Skin: warm and dry, no rash Neuro:  Alert and Oriented x 3, Strength and sensation are intact Psych: euthymic mood, full affect  EKG:  Today 11/14/16-sinus rhythm, 64, poor R-wave progression consistent with old anteroseptal infarct pattern personally viewed no significant change from prior- 11/08/15-sinus bradycardia rate 58, poor R-wave progression old septal infarct pattern personally viewed-no change from prior 11/26/14-Sinus rhythm rate 44 with old septal infarct pattern, poor R wave progression   no change from prior EKG  Echocardiogram: 01/22/14 - Left ventricle: The cavity size was normal. Wall thickness was increased in a pattern of mild LVH. Systolic function was vigorous. The estimated ejection fraction was in the range of 65% to 70%. Wall motion was normal; there were no regional wall motion abnormalities. Doppler parameters are consistent with abnormal left ventricular relaxation (grade 1 diastolic dysfunction). Doppler parameters are consistent with high ventricular filling pressure. - Aortic valve: Cusp separation was reduced. There was mild stenosis. Peak velocity: 259cm/s (S). Mean gradient: 68mm Hg (S). - Mitral valve: Moderately calcified annulus. - Left atrium: The atrium was mildly dilated.  Nuclear stress test 7/14-low risk, no ischemia   ECHO 06/26/16: - Left ventricle: The cavity size was normal. There was moderate   focal basal and mild concentric hypertrophy. Systolic function   was normal. The estimated ejection fraction was in the range of   60% to 65%. Features are consistent with a pseudonormal left   ventricular filling pattern, with concomitant abnormal relaxation   and increased filling pressure (grade 2 diastolic  dysfunction).   Doppler parameters are consistent with high ventricular filling   pressure. - Aortic valve: Severe diffuse thickening and calcification. Valve   mobility was restricted. There was mild stenosis. - Mitral valve: Severely calcified annulus. There was mild   regurgitation. - Atrial septum: There was increased thickness of the septum,   consistent with lipomatous hypertrophy.  ASSESSMENT AND PLAN:  1. Old myocardial infarction-year 2000, 3 months after her husband Joneen Caraway died.  Last stress test in July 29, 2013 was reassuring. Low risk. Isosorbide 30 mg daily seems to be helping. We talked about discontinuing the medication one point but she would like to continue with it. This is fine. No headache. She will occasionally taken nitroglycerin but this is very rare. This seems to help. If she wakes up in the night with chest discomfort, she may drink a glass of water and it usually goes away. She has not had this in quite some time. We discussed the possibility of statin medication once again, she did have some myalgias previously. We decided not to pursue. May be hiatal hernia. 2. Angina-as described above. Question if neck/jaw discomfort is secondary to angina. Improved with isosorbide. Doing well currently. No further chest pain. Occasional behind the ear pain. Continue to monitor. Discussed previously with her and her son. Had one episode of fleeting discomfort. Possibly musculoskeletal. Possible GI at night. Nonetheless, she has been quite stable with this. Last stress test reassuring. EF reassuring 3. Coronary artery disease- remote history in past. Last stress test low risk. No changes made. Secondary prevention. Documented in medical record. Stable. 4. Diabetes-per Dr. Delfina Redwood. No changes 5. Mild aortic stenosis. Crescendo, decrescendo murmur. Remains mild, echocardiogram reviewed 6. Leg cramps - KCL and gabapentin.  7. We'll see back in 6 months.  Signed, Candee Furbish, MD [facility]   05/16/2017 11:10 AM

## 2017-05-16 NOTE — Patient Instructions (Signed)

## 2017-06-27 ENCOUNTER — Other Ambulatory Visit: Payer: Self-pay | Admitting: Cardiology

## 2017-07-24 DIAGNOSIS — G8929 Other chronic pain: Secondary | ICD-10-CM | POA: Diagnosis not present

## 2017-07-24 DIAGNOSIS — M533 Sacrococcygeal disorders, not elsewhere classified: Secondary | ICD-10-CM | POA: Diagnosis not present

## 2017-08-02 DIAGNOSIS — M533 Sacrococcygeal disorders, not elsewhere classified: Secondary | ICD-10-CM | POA: Diagnosis not present

## 2017-08-02 DIAGNOSIS — G8929 Other chronic pain: Secondary | ICD-10-CM | POA: Diagnosis not present

## 2017-08-17 DIAGNOSIS — E7800 Pure hypercholesterolemia, unspecified: Secondary | ICD-10-CM | POA: Diagnosis not present

## 2017-08-17 DIAGNOSIS — I2510 Atherosclerotic heart disease of native coronary artery without angina pectoris: Secondary | ICD-10-CM | POA: Diagnosis not present

## 2017-08-17 DIAGNOSIS — E1122 Type 2 diabetes mellitus with diabetic chronic kidney disease: Secondary | ICD-10-CM | POA: Diagnosis not present

## 2017-08-17 DIAGNOSIS — Z7984 Long term (current) use of oral hypoglycemic drugs: Secondary | ICD-10-CM | POA: Diagnosis not present

## 2017-08-17 DIAGNOSIS — Z1389 Encounter for screening for other disorder: Secondary | ICD-10-CM | POA: Diagnosis not present

## 2017-08-17 DIAGNOSIS — Z0000 Encounter for general adult medical examination without abnormal findings: Secondary | ICD-10-CM | POA: Diagnosis not present

## 2017-08-17 DIAGNOSIS — I10 Essential (primary) hypertension: Secondary | ICD-10-CM | POA: Diagnosis not present

## 2017-08-17 DIAGNOSIS — E1165 Type 2 diabetes mellitus with hyperglycemia: Secondary | ICD-10-CM | POA: Diagnosis not present

## 2017-08-17 DIAGNOSIS — R42 Dizziness and giddiness: Secondary | ICD-10-CM | POA: Diagnosis not present

## 2017-10-15 DIAGNOSIS — M25572 Pain in left ankle and joints of left foot: Secondary | ICD-10-CM | POA: Diagnosis not present

## 2017-11-20 ENCOUNTER — Ambulatory Visit: Payer: Medicare Other | Admitting: Cardiology

## 2017-11-22 ENCOUNTER — Ambulatory Visit (INDEPENDENT_AMBULATORY_CARE_PROVIDER_SITE_OTHER): Payer: Medicare Other | Admitting: Cardiology

## 2017-11-22 ENCOUNTER — Encounter: Payer: Self-pay | Admitting: Cardiology

## 2017-11-22 VITALS — BP 140/70 | HR 65 | Ht 62.5 in | Wt 147.4 lb

## 2017-11-22 DIAGNOSIS — I2510 Atherosclerotic heart disease of native coronary artery without angina pectoris: Secondary | ICD-10-CM

## 2017-11-22 DIAGNOSIS — I350 Nonrheumatic aortic (valve) stenosis: Secondary | ICD-10-CM

## 2017-11-22 DIAGNOSIS — I208 Other forms of angina pectoris: Secondary | ICD-10-CM | POA: Diagnosis not present

## 2017-11-22 DIAGNOSIS — I2583 Coronary atherosclerosis due to lipid rich plaque: Secondary | ICD-10-CM | POA: Diagnosis not present

## 2017-11-22 DIAGNOSIS — I252 Old myocardial infarction: Secondary | ICD-10-CM

## 2017-11-22 NOTE — Progress Notes (Signed)
[facility]. [address], [address], [place]  [address] Phone: [phone] Fax:  [phone]  Date:  11/22/2017   ID:  Barbara Russell, DOB 09/07/1930, MRN [id number]  PCP:  Barbara Carol, MD   History of Present Illness: Barbara Russell is a 83 y.o. female who has a history of hypertension, diabetes, normal stress test in 06/2013 low risk with remote history of CAD documented in the past with myocardial infarction in the year 2000. She has been intolerant of statins.  She also had a several year history of pain/shortness of breath radiating to her neck that was described as moderate severity. At that time she did not wish to take Lasix. She was worried about worsening leg cramping.  Diarrhea for years, lactose intolerant. Main complaint. No hiatal hernia.  Saw my mother at wedding in [place].   Previously if goes up hill has pain that goes up to neck.Chasing dog feels same thing. Knitting same thing. Imdur has helped.   04/07/14 - Chest pain has gone away. Still occasionally he may feel some pain behind her ears but this is rare. She is doing very well with her medications. Echocardiogram showed mild aortic stenosis. Normal EF.  11/26/14-she will occasionally have neck pain when brushing after her dogs. Most of her complaints are orthopedic. She is not describing any significant change in shortness of breath. Doing well.  05/12/15 - Woke up felt sharp pain in head. 220/95 in night BP. Took her to ER. Dr. Delfina Russell looked at BP log. Eventually came back down. In the distant past she has some orthostatic hypotension. She's not having any chest pain, no shortness of breath. She had previously a right knee replacement.  11/08/15-overall doing well. She had a fleeting chest discomfort in the middle the night, resolved on its own. Occasionally she will have burning sensation with her nitroglycerin under her tongue.  05/10/16-overall doing well. Fleeting chest pain at times. Takes  nitroglycerin. She is not feeling any significant shortness of breath. She did have a fall on her flagstone. Hurt her lip but no significant bleeding. She still has a residual "bump" in that region. No syncope.  05/16/17 - week ago took NTG x 2, elephant on chest. Non exertional. This happened at night. Now having back pain, Dr. Nelva Russell. Trying Dexamethasone cream on patch and this helps. No shortness of breath, no syncope, no bleeding. Barbara Russell is her grandson  11/22/17 - stopped isosorbide.  She feels better.  She has not had any further episodes of angina.  She just turned 23 and feels a little bit slower than she used to but she had 7 people over to her house for bridge and cooked for them.  No syncope bleeding orthopnea PND  Wt Readings from Last 3 Encounters:  11/22/17 147 lb 6.4 oz (66.9 kg)  05/16/17 145 lb (65.8 kg)  11/14/16 143 lb 6.4 oz (65 kg)     Past Medical History:  Diagnosis Date  . Aortic heart murmur    aortic sclerosis w/out and significant stenosis  . Breast cancer ([place])    Hx of BC, s/p mastectomy on right 1999, s/p tamoxifen X1 year  . Chest pain    EC Stress test 12/21/11 Low risk, no ischemia, normal EF  . Coronary artery disease    Remote Hx of CAD, normal stress test 2009  . Diabetes mellitus without complication ([facility])   . Dyspnea   . Hypercholesteremia   . Hypertension   .  Lichen sclerosus   . Low back pain   . Myocardial infarction ([facility]) 2000  . Osteoarthrosis, unspecified whether generalized or localized, unspecified site   . Pain    Several year Hx of pain/ SOB radiating to her neck  . SOB (shortness of breath)     Past Surgical History:  Procedure Laterality Date  . APPENDECTOMY    . HAND SURGERY Bilateral    CTS and trigger finger on right and left  . MASTECTOMY Right 1999  . REPLACEMENT TOTAL KNEE Right 11/2009  . VESICOVAGINAL FISTULA CLOSURE W/ TAH  1969    Current Outpatient Medications  Medication Sig Dispense Refill  . aspirin 81 MG  tablet Take 81 mg by mouth daily.    Marland Kitchen b complex vitamins tablet Take 1 tablet by mouth daily.    . cetirizine (ZYRTEC) 10 MG tablet Take 10 mg by mouth daily.    . cholecalciferol (VITAMIN D) 1000 UNITS tablet Take 1,000 Units by mouth daily.     Marland Kitchen Dexamethasone Sodium Phosphate POWD Apply 1 patch topically daily. For four hours then remove    . furosemide (LASIX) 20 MG tablet Take 20 mg by mouth daily.     Marland Kitchen gabapentin (NEURONTIN) 300 MG capsule Take 600 mg by mouth at bedtime.     . isosorbide mononitrate (IMDUR) 30 MG 24 hr tablet TAKE 1 TABLET (30 MG TOTAL) BY MOUTH DAILY. 90 tablet 3  . losartan (COZAAR) 25 MG tablet Take 25 mg by mouth daily.     . metFORMIN (GLUCOPHAGE) 500 MG tablet Take 500 mg by mouth daily at 12 noon.    . nitroGLYCERIN (NITROSTAT) 0.4 MG SL tablet Place 0.4 mg under the tongue every 5 (five) minutes as needed for chest pain.    . Omega-3 Fatty Acids (FISH OIL) 1200 MG CAPS Take 1,200 mg by mouth daily.    . potassium gluconate 595 MG TABS tablet Take 595 mg by mouth at bedtime.    Marland Kitchen zolpidem (AMBIEN) 5 MG tablet Take 2.5 mg by mouth at bedtime.     No current facility-administered medications for this visit.     Allergies:    Allergies  Allergen Reactions  . Benadryl [Diphenhydramine Hcl (Sleep)]     Does opposite affect. Wakes patient up  . Ciprofloxacin     welps  . Lactose Intolerance (Gi)     DIARRHEA  . Macrobid [Nitrofurantoin Monohyd Macro]     Nausea vommitting  . Macrodantin [Nitrofurantoin Macrocrystal]     Nausea  vommiting  . Naproxen     Stomach pain  . Quinine Sulfate [Quinine]     welps  . Sulfa Antibiotics     welps   . Tramadol     unknown to patient  . Penicillins Rash  . Prednisone     pain    Social History:  The patient  reports that  has never smoked. she has never used smokeless tobacco. She reports that she does not drink alcohol or use drugs.   ROS:  Please see the history of present illness.   Denies any fevers,  chills, orthopnea, PND    PHYSICAL EXAM: VS:  BP 140/70   Pulse 65   Ht 5' 2.5" (1.588 m)   Wt 147 lb 6.4 oz (66.9 kg)   LMP  (LMP Unknown)   SpO2 93%   BMI 26.53 kg/m  GEN: Well nourished, well developed, in no acute distress  HEENT: normal  Neck: no JVD, carotid  bruits, or masses Cardiac: RRR; 2/6 SM,no rubs, or gallops,no edema  Respiratory:  clear to auscultation bilaterally, normal work of breathing GI: soft, nontender, nondistended, + BS MS: no deformity or atrophy  Skin: warm and dry, no rash Neuro:  Alert and Oriented x 3, Strength and sensation are intact Psych: euthymic mood, full affect  EKG:  Today 11/22/17-sinus rhythm 52 old septal infarct pattern personally viewed-prior 11/14/16-sinus rhythm, 64, poor R-wave progression consistent with old anteroseptal infarct pattern personally viewed no significant change from prior- 11/08/15-sinus bradycardia rate 58, poor R-wave progression old septal infarct pattern personally viewed-no change from prior 11/26/14-Sinus rhythm rate 72 with old septal infarct pattern, poor R wave progression   no change from prior EKG  Echocardiogram: 01/22/14 - Left ventricle: The cavity size was normal. Wall thickness was increased in a pattern of mild LVH. Systolic function was vigorous. The estimated ejection fraction was in the range of 65% to 70%. Wall motion was normal; there were no regional wall motion abnormalities. Doppler parameters are consistent with abnormal left ventricular relaxation (grade 1 diastolic dysfunction). Doppler parameters are consistent with high ventricular filling pressure. - Aortic valve: Cusp separation was reduced. There was mild stenosis. Peak velocity: 259cm/s (S). Mean gradient: 33mm Hg (S). - Mitral valve: Moderately calcified annulus. - Left atrium: The atrium was mildly dilated.  Nuclear stress test 7/14-low risk, no ischemia   ECHO 06/26/16: - Left ventricle: The cavity size was  normal. There was moderate   focal basal and mild concentric hypertrophy. Systolic function   was normal. The estimated ejection fraction was in the range of   60% to 65%. Features are consistent with a pseudonormal left   ventricular filling pattern, with concomitant abnormal relaxation   and increased filling pressure (grade 2 diastolic dysfunction).   Doppler parameters are consistent with high ventricular filling   pressure. - Aortic valve: Severe diffuse thickening and calcification. Valve   mobility was restricted. There was mild stenosis. - Mitral valve: Severely calcified annulus. There was mild   regurgitation. - Atrial septum: There was increased thickness of the septum,   consistent with lipomatous hypertrophy.  ASSESSMENT AND PLAN:  1. Old myocardial infarction-year 2000, 3 months after her husband Joneen Caraway died.  Last stress test in August 01, 2013 was reassuring. Low risk.  We discussed the possibility of statin medication once again, she did have some myalgias previously. We decided not to pursue. May be hiatal hernia. 2. Angina-as described above.  Has been doing very well.  She is off of the isosorbide and feeling better.  Last stress test reassuring. EF reassuring 3. Coronary artery disease- remote history in past. Last stress test low risk.  No changes. Secondary prevention.  4. Diabetes-per Dr. Delfina Russell.  Doing well.   5. Mild aortic stenosis. Crescendo, decrescendo murmur. Remains mild, echocardiogram reviewed again.  We will forego echocardiogram this year, contemplate rechecking next year. 6. We'll see back in 6 months.  Signed, Candee Furbish, MD [facility]  11/22/2017 10:58 AM

## 2017-11-22 NOTE — Patient Instructions (Signed)
Medication Instructions:  Your physician recommends that you continue on your current medications as directed. Please refer to the Current Medication list given to you today.  Labwork: None  Testing/Procedures: None  Follow-Up: Your physician wants you to follow-up in: 6 months with Dr. Skains.  You will receive a reminder letter in the mail two months in advance. If you don't receive a letter, please call our office to schedule the follow-up appointment.   Any Other Special Instructions Will Be Listed Below (If Applicable).     If you need a refill on your cardiac medications before your next appointment, please call your pharmacy.   

## 2018-02-27 DIAGNOSIS — E7800 Pure hypercholesterolemia, unspecified: Secondary | ICD-10-CM | POA: Diagnosis not present

## 2018-02-27 DIAGNOSIS — G4700 Insomnia, unspecified: Secondary | ICD-10-CM | POA: Diagnosis not present

## 2018-02-27 DIAGNOSIS — I10 Essential (primary) hypertension: Secondary | ICD-10-CM | POA: Diagnosis not present

## 2018-02-27 DIAGNOSIS — E1122 Type 2 diabetes mellitus with diabetic chronic kidney disease: Secondary | ICD-10-CM | POA: Diagnosis not present

## 2018-02-27 DIAGNOSIS — Z7984 Long term (current) use of oral hypoglycemic drugs: Secondary | ICD-10-CM | POA: Diagnosis not present

## 2018-02-27 DIAGNOSIS — E1165 Type 2 diabetes mellitus with hyperglycemia: Secondary | ICD-10-CM | POA: Diagnosis not present

## 2018-02-27 DIAGNOSIS — I2510 Atherosclerotic heart disease of native coronary artery without angina pectoris: Secondary | ICD-10-CM | POA: Diagnosis not present

## 2018-03-07 DIAGNOSIS — M545 Low back pain, unspecified: Secondary | ICD-10-CM | POA: Insufficient documentation

## 2018-03-28 ENCOUNTER — Other Ambulatory Visit: Payer: Self-pay | Admitting: Cardiology

## 2018-04-13 ENCOUNTER — Emergency Department (HOSPITAL_COMMUNITY): Payer: Medicare Other

## 2018-04-13 ENCOUNTER — Emergency Department (HOSPITAL_COMMUNITY)
Admission: EM | Admit: 2018-04-13 | Discharge: 2018-04-13 | Disposition: A | Discharge status: Home / Self Care | Payer: Medicare Other | Attending: Emergency Medicine | Admitting: Emergency Medicine

## 2018-04-13 ENCOUNTER — Other Ambulatory Visit: Payer: Self-pay

## 2018-04-13 ENCOUNTER — Encounter (HOSPITAL_COMMUNITY): Payer: Self-pay | Admitting: Emergency Medicine

## 2018-04-13 DIAGNOSIS — E119 Type 2 diabetes mellitus without complications: Secondary | ICD-10-CM | POA: Insufficient documentation

## 2018-04-13 DIAGNOSIS — R0602 Shortness of breath: Secondary | ICD-10-CM | POA: Diagnosis not present

## 2018-04-13 DIAGNOSIS — Z96651 Presence of right artificial knee joint: Secondary | ICD-10-CM | POA: Insufficient documentation

## 2018-04-13 DIAGNOSIS — Z853 Personal history of malignant neoplasm of breast: Secondary | ICD-10-CM | POA: Insufficient documentation

## 2018-04-13 DIAGNOSIS — I2510 Atherosclerotic heart disease of native coronary artery without angina pectoris: Secondary | ICD-10-CM | POA: Diagnosis not present

## 2018-04-13 DIAGNOSIS — Z8679 Personal history of other diseases of the circulatory system: Secondary | ICD-10-CM | POA: Diagnosis not present

## 2018-04-13 DIAGNOSIS — Z7984 Long term (current) use of oral hypoglycemic drugs: Secondary | ICD-10-CM | POA: Insufficient documentation

## 2018-04-13 DIAGNOSIS — Z79899 Other long term (current) drug therapy: Secondary | ICD-10-CM | POA: Diagnosis not present

## 2018-04-13 DIAGNOSIS — I252 Old myocardial infarction: Secondary | ICD-10-CM | POA: Diagnosis not present

## 2018-04-13 DIAGNOSIS — R61 Generalized hyperhidrosis: Secondary | ICD-10-CM | POA: Insufficient documentation

## 2018-04-13 DIAGNOSIS — Z9011 Acquired absence of right breast and nipple: Secondary | ICD-10-CM | POA: Insufficient documentation

## 2018-04-13 DIAGNOSIS — R0789 Other chest pain: Secondary | ICD-10-CM | POA: Diagnosis not present

## 2018-04-13 DIAGNOSIS — Z7982 Long term (current) use of aspirin: Secondary | ICD-10-CM | POA: Insufficient documentation

## 2018-04-13 LAB — CBC
HEMATOCRIT: 37.7 % (ref 36.0–46.0)
Hemoglobin: 12.9 g/dL (ref 12.0–15.0)
MCH: 32.2 pg (ref 26.0–34.0)
MCHC: 34.2 g/dL (ref 30.0–36.0)
MCV: 94 fL (ref 78.0–100.0)
Platelets: 287 10*3/uL (ref 150–400)
RBC: 4.01 MIL/uL (ref 3.87–5.11)
RDW: 13.1 % (ref 11.5–15.5)
WBC: 7.9 10*3/uL (ref 4.0–10.5)

## 2018-04-13 LAB — BASIC METABOLIC PANEL
Anion gap: 14 (ref 5–15)
BUN: 18 mg/dL (ref 6–20)
CHLORIDE: 97 mmol/L — AB (ref 101–111)
CO2: 23 mmol/L (ref 22–32)
CREATININE: 0.99 mg/dL (ref 0.44–1.00)
Calcium: 9.6 mg/dL (ref 8.9–10.3)
GFR calc Af Amer: 59 mL/min — ABNORMAL LOW (ref >60)
GFR calc non Af Amer: 51 mL/min — ABNORMAL LOW (ref >60)
Glucose, Bld: 222 mg/dL — ABNORMAL HIGH (ref 65–99)
POTASSIUM: 3.7 mmol/L (ref 3.5–5.1)
Sodium: 134 mmol/L — ABNORMAL LOW (ref 135–145)

## 2018-04-13 LAB — I-STAT TROPONIN, ED
TROPONIN I, POC: 0.02 ng/mL (ref 0.00–0.08)
Troponin i, poc: 0 ng/mL (ref 0.00–0.08)

## 2018-04-13 NOTE — ED Notes (Signed)
Pt ambulated self efficiently with minimal assistance.

## 2018-04-13 NOTE — Discharge Instructions (Signed)
Your work up today was reassuring. However you are considered moderate risk given age and past medical history.  Return for ANY chest pain or shortness of breath returns, if there is exertional chest pain or shortness of breath, nausea, vomiting, sweating, palpitations. Call your cardiologist to make an appointment as soon as possible. If unable to get an appointment with cardiologist, attempt to see your primary care doctor on Monday.

## 2018-04-13 NOTE — ED Notes (Signed)
Ambulated pt around Pod D, Pulse Oximetry stayed between 97 and 100. Pt kept a very good pace while ambulating

## 2018-04-13 NOTE — ED Notes (Signed)
Resting on stretcher - no complaints at this time

## 2018-04-13 NOTE — ED Triage Notes (Signed)
Pt. Stated, ive had chest pressure and very bad SOB started around 100pm

## 2018-04-13 NOTE — ED Provider Notes (Addendum)
[facility] EMERGENCY DEPARTMENT Provider Note   CSN: [id number] Arrival date & time: 04/13/18  1407     History   Chief Complaint Chief Complaint  Patient presents with  . Chest Pain  . Shortness of Breath    HPI Barbara Russell is a 84 y.o. female with history of CAD, MI 2000, angina on nitroglycerin, diabetes, mild aortic stenosis, mitral valve regurgitation is here for evaluation of sudden onset, resolved shortness of breath described as "my breathing was labored" onset approximately noon today while she was walking around her home.  Few moments later she developed mild, central, upper chest "pressure", nonradiating, nonpleuritic.  She broke out in a sweat.  She took her blood pressure and it was 135/70 pulse 105.  She is concerned about her pulse her typical ranges 60-75.  Felt like her heart was pounding as well.  She took one nitroglycerin.  By the time her son picked her up the symptoms had resolved.  She called [employer] nurse who told her to come to the ER.  Has had intermittent, left-sided chest pressure in the past that usually resolves with nitroglycerin.  Last time she had to use nitroglycerin was 1 month ago.  She typically does not have shortness of breath with these episodes.  Since turning 87, she states that she has had more exertional shortness of breath but never this severe.  Seen by cardiologist 5 months ago noted to have low risk nuclear stress test, EF 60-65%.  She has no fever, cough, currently no chest pain or shortness of breath, no lower extremity swelling or calf pain.  HPI  Past Medical History:  Diagnosis Date  . Aortic heart murmur    aortic sclerosis w/out and significant stenosis  . Breast cancer ([facility])    Hx of BC, s/p mastectomy on right 1999, s/p tamoxifen X1 year  . Chest pain    EC Stress test 12/21/11 Low risk, no ischemia, normal EF  . Coronary artery disease    Remote Hx of CAD, normal stress test 2009  .  Diabetes mellitus without complication ([place])   . Dyspnea   . Hypercholesteremia   . Hypertension   . Lichen sclerosus   . Low back pain   . Myocardial infarction ([facility]) 2000  . Osteoarthrosis, unspecified whether generalized or localized, unspecified site   . Pain    Several year Hx of pain/ SOB radiating to her neck  . SOB (shortness of breath)     Patient Active Problem List   Diagnosis Date Noted  . Coronary artery disease due to lipid rich plaque 11/26/2014  . Old MI (myocardial infarction) 04/07/2014  . Aortic stenosis 04/07/2014  . Type II or unspecified type diabetes mellitus without mention of complication, not stated as uncontrolled 04/07/2014  . Angina decubitus ([place]) 04/07/2014    Past Surgical History:  Procedure Laterality Date  . APPENDECTOMY    . HAND SURGERY Bilateral    CTS and trigger finger on right and left  . MASTECTOMY Right 1999  . REPLACEMENT TOTAL KNEE Right 11/2009  . VESICOVAGINAL FISTULA CLOSURE W/ TAH  1969     OB History   None      Home Medications    Prior to Admission medications   Medication Sig Start Date End Date Taking? Authorizing Provider  aspirin 81 MG tablet Take 81 mg by mouth daily.   Yes [provider]  b complex vitamins tablet Take 1 tablet by  mouth daily.   Yes [provider]  cetirizine (ZYRTEC) 10 MG tablet Take 10 mg by mouth daily as needed for allergies.    Yes [provider]  cholecalciferol (VITAMIN D) 1000 UNITS tablet Take 1,000 Units by mouth daily.    Yes [provider]  furosemide (LASIX) 20 MG tablet Take 20 mg by mouth daily.    Yes [provider]  gabapentin (NEURONTIN) 300 MG capsule Take 600 mg by mouth at bedtime.    Yes [provider]  losartan (COZAAR) 25 MG tablet Take 25 mg by mouth daily.  11/17/14  Yes [provider]  metFORMIN (GLUCOPHAGE) 500 MG tablet Take 500 mg by mouth daily at 12 noon.   Yes [provider]    nitroGLYCERIN (NITROSTAT) 0.4 MG SL tablet Place 1 tablet (0.4 mg total) under the tongue every 5 (five) minutes as needed for chest pain. 03/28/18  Yes Jerline Pain, MD  Omega-3 Fatty Acids (FISH OIL) 1200 MG CAPS Take 1,200 mg by mouth daily.   Yes [provider]  potassium gluconate 595 MG TABS tablet Take 595 mg by mouth at bedtime.   Yes [provider]  zolpidem (AMBIEN) 5 MG tablet Take 5 mg by mouth at bedtime.    Yes [provider]  isosorbide mononitrate (IMDUR) 30 MG 24 hr tablet TAKE 1 TABLET (30 MG TOTAL) BY MOUTH DAILY. Patient not taking: Reported on 04/13/2018 06/27/17   Jerline Pain, MD    Family History Family History  Problem Relation Age of Onset  . Cancer Mother        cervical  . Heart disease Father     Social History Social History   Tobacco Use  . Smoking status: Never Smoker  . Smokeless tobacco: Never Used  Substance Use Topics  . Alcohol use: No  . Drug use: No     Allergies   Benadryl [diphenhydramine hcl (sleep)]; Ciprofloxacin; Lactose intolerance (gi); Macrobid [nitrofurantoin monohyd macro]; Naproxen; Quinine sulfate [quinine]; Sulfa antibiotics; Tramadol; Penicillins; and Prednisone   Review of Systems Review of Systems  Respiratory: Positive for shortness of breath.   Cardiovascular: Positive for chest pain.  All other systems reviewed and are negative.    Physical Exam Updated Vital Signs BP (!) 163/88   Pulse 60   Temp 97.7 F (36.5 C) (Oral)   Resp 16   Ht 5\' 3"  (1.6 m)   Wt 65.8 kg (145 lb)   LMP  (LMP Unknown)   SpO2 100%   BMI 25.69 kg/m   Physical Exam  Constitutional: She appears well-developed and well-nourished.  NAD. Non toxic.   HENT:  Head: Normocephalic and atraumatic.  Nose: Nose normal.  Moist mucous membranes. Tonsils and oropharynx normal  Eyes: Conjunctivae, EOM and lids are normal.  Neck: Trachea normal and normal range of motion.  Neck is supple. Trachea midline. No  cervical adenopathy  Cardiovascular: Normal rate, regular rhythm, S1 normal, S2 normal and normal heart sounds.  Pulses:      Carotid pulses are 2+ on the right side, and 2+ on the left side.      Radial pulses are 2+ on the right side, and 2+ on the left side.       Dorsalis pedis pulses are 2+ on the right side, and 2+ on the left side.  RRR. No orthopnea. No LE edema or calf tenderness.   Pulmonary/Chest: Effort normal and breath sounds normal. No respiratory distress. She has  no decreased breath sounds. She has no rhonchi.  No reproducible chest wall tenderness. CP not reproducible with AROM of upper extremities. No rales or wheezing.  Abdominal: Soft. Bowel sounds are normal. There is no tenderness.  No epigastric tenderness. No distention.   Neurological: She is alert. GCS eye subscore is 4. GCS verbal subscore is 5. GCS motor subscore is 6.  Skin: Skin is warm and dry. Capillary refill takes less than 2 seconds.  No rash to chest wall  Psychiatric: She has a normal mood and affect. Her speech is normal and behavior is normal. Judgment and thought content normal. Cognition and memory are normal.     ED Treatments / Results  Labs (all labs ordered are listed, but only abnormal results are displayed) Labs Reviewed  BASIC METABOLIC PANEL - Abnormal; Notable for the following components:      Result Value   Sodium 134 (*)    Chloride 97 (*)    Glucose, Bld 222 (*)    GFR calc non Af Amer 51 (*)    GFR calc Af Amer 59 (*)    All other components within normal limits  CBC  I-STAT TROPONIN, ED  I-STAT TROPONIN, ED    EKG EKG Interpretation  Date/Time:  Saturday Apr 13 2018 14:17:00 EDT Ventricular Rate:  96 PR Interval:  192 QRS Duration: 94 QT Interval:  358 QTC Calculation: 452 R Axis:   -46 Text Interpretation:  Normal sinus rhythm Left axis deviation Inferior infarct , age undetermined Anteroseptal infarct , age undetermined Abnormal ECG increased rate otherwise no sig  chabge from previous Confirmed by Charlesetta Shanks [phone]) on 04/13/2018 4:25:31 PM   Radiology Dg Chest 2 View  Result Date: 04/13/2018 CLINICAL DATA:  84 year old female with a history of chest pressure EXAM: CHEST - 2 VIEW COMPARISON:  03/18/2015, 10/09/2013 FINDINGS: Cardiomediastinal silhouette unchanged in size and contour. No evidence of central vascular congestion. No interlobular septal thickening. Coarsened interstitial markings bilaterally. Surgical changes of the right chest and axilla. No confluent airspace disease. Degenerative changes of the thoracic spine. IMPRESSION: Chronic lung changes without evidence of acute cardiopulmonary disease. Surgical changes of the right chest Electronically Signed   By: Corrie Mckusick D.O.   On: 04/13/2018 14:57    Procedures Procedures (including critical care time)  Medications Ordered in ED Medications - No data to display   Initial Impression / Assessment and Plan / ED Course  I have reviewed the triage vital signs and the nursing notes.  Pertinent labs & imaging results that were available during my care of the patient were reviewed by me and considered in my medical decision making (see chart for details).  Clinical Course as of Apr 13 1928  Sat Apr 13, 2018  1640 Normal sinus rhythm Left axis deviation Inferior infarct , age undetermined Anteroseptal infarct , age undetermined Abnormal ECG increased rate otherwise no sig chabge from previous Confirmed by Charlesetta Shanks [phone]) on 04/13/2018 4:25:31 PM  ED EKG within 10 minutes [CG]    Clinical Course User Index [CG] Kinnie Feil, PA-C   84 year old here with  shortness of breath and chest pressure.  Onset while doing minimal exertional activity.  Resolved after nitroglycerin.  She has history of angina, last use of nitroglycerin was 1 month ago.  Pertinent risk factors include diabetes on metformin, CAD after MI in 2000.  On exam, she  is asymptomatic.  Vital signs WNL.   Cardiopulmonary exam benign.  No tachycardia, tachypnea, hypoxia.  No lower extremity edema or calf tenderness.  She does not look fluid overloaded.    Suspect episode of brief angina however given age and pmh she is higher risk for complaint of CP. ACS/MI on differential.  PE, PNA, dissection on differential however lower as pt now asymptomatic.   Chest x-ray, EKG, troponin x2 within normal limits.  CBC and BMP unremarkable.  Doubt PE in this patient, there is no tachycardia, tachypnea, hypoxia, pleuritic chest pain.  She does not have risk factors for PE/DVT.  Patient was shared with supervising physician.  Although she has higher risk given past medical history, chest pain today is atypical in nature.  Had shared decision making with patient and son at bedside, they are aware that she is higher risk for cardiac complications.  However, feel she is appropriate for discharge with close cardiology follow-up in the next 24 to 48 hours.  She was given strict ED return precautions that warrant return to the ER for further evaluation. I will send massage to pt cardiologist to ensure close and appropriate follow up.  Final Clinical Impressions(s) / ED Diagnoses   Final diagnoses:  Atypical chest pain    ED Discharge Orders    None       Arlean Hopping 04/13/18 1930    Charlesetta Shanks, MD 04/14/18 [phone]

## 2018-04-13 NOTE — ED Provider Notes (Signed)
Medical screening examination/treatment/procedure(s) were conducted as a shared visit with non-physician practitioner(s) and myself.  I personally evaluated the patient during the encounter.  EKG Interpretation  Date/Time:  Saturday Apr 13 2018 14:17:00 EDT Ventricular Rate:  96 PR Interval:  192 QRS Duration: 94 QT Interval:  358 QTC Calculation: 452 R Axis:   -46 Text Interpretation:  Normal sinus rhythm Left axis deviation Inferior infarct , age undetermined Anteroseptal infarct , age undetermined Abnormal ECG increased rate otherwise no sig chabge from previous Confirmed by Charlesetta Shanks [phone]) on 04/13/2018 4:25:31 PM She ate lunch and at 1 PM started developing chest pressure and shortness of breath.  She reports she felt pressure around the base of her throat in her upper chest.  She took a nitroglycerin.  She reports her blood pressure was somewhat low (120s over 70s).  She had checked it after the nitroglycerin by about 15 or 20 minutes.  Within approximately 30 minutes the pain was resolved.  Patient reports the last time she had a nitroglycerin was approximately 1 month ago.  She reports she awakened with chest pressure at night and took a nitroglycerin which relieved it.  She has not had chest pain since.  Patient reports she is able to walk throughout the [employer] and do her normal chores and activities without exertional dyspnea or chest pain.  Patient is alert and nontoxic.  No respiratory distress.  Heart is regular.  2\6 systolic ejection murmur.  Lungs clear.  Abdomen soft and nontender.  Lower extremities no peripheral edema.  No calf tenderness.  I have reviewed plan with patient.  She and husband are comfortable with follow-up with cardiology.  They are to call first thing Monday.  Return precautions have been reviewed for immediate return if any chest pain, nausea, diaphoresis, general weakness.   Charlesetta Shanks, MD 04/14/18 [phone]

## 2018-04-15 ENCOUNTER — Telehealth: Payer: Self-pay | Admitting: Cardiology

## 2018-04-15 NOTE — Telephone Encounter (Signed)
Will forward to Dr Marlou Porch for his knowledge and review.  Pt has been scheduled for her 6 month f/u 05/17/18.

## 2018-04-15 NOTE — Telephone Encounter (Signed)
New Message:      Pt is wanting to let us know she was in the ER this weekend with pressure. Pt states she feels fine now but wanted to let us know. Pt states if you have any questions or concerns she states to call her this afternoon.

## 2018-04-16 NOTE — Telephone Encounter (Signed)
Reviewed ER visit, ECG, Trop normal. OK with June visit.  Candee Furbish, MD

## 2018-04-26 ENCOUNTER — Encounter: Payer: Self-pay | Admitting: Cardiology

## 2018-05-01 DIAGNOSIS — Z6826 Body mass index (BMI) 26.0-26.9, adult: Secondary | ICD-10-CM | POA: Diagnosis not present

## 2018-05-01 DIAGNOSIS — Z1231 Encounter for screening mammogram for malignant neoplasm of breast: Secondary | ICD-10-CM | POA: Diagnosis not present

## 2018-05-01 DIAGNOSIS — Z01419 Encounter for gynecological examination (general) (routine) without abnormal findings: Secondary | ICD-10-CM | POA: Diagnosis not present

## 2018-05-17 ENCOUNTER — Ambulatory Visit (INDEPENDENT_AMBULATORY_CARE_PROVIDER_SITE_OTHER): Payer: Medicare Other | Admitting: Cardiology

## 2018-05-17 ENCOUNTER — Encounter: Payer: Self-pay | Admitting: Cardiology

## 2018-05-17 VITALS — BP 150/76 | HR 65 | Ht 63.0 in | Wt 145.0 lb

## 2018-05-17 DIAGNOSIS — I2510 Atherosclerotic heart disease of native coronary artery without angina pectoris: Secondary | ICD-10-CM

## 2018-05-17 DIAGNOSIS — I2583 Coronary atherosclerosis due to lipid rich plaque: Secondary | ICD-10-CM | POA: Diagnosis not present

## 2018-05-17 DIAGNOSIS — I208 Other forms of angina pectoris: Secondary | ICD-10-CM | POA: Diagnosis not present

## 2018-05-17 DIAGNOSIS — I350 Nonrheumatic aortic (valve) stenosis: Secondary | ICD-10-CM | POA: Diagnosis not present

## 2018-05-17 MED ORDER — ISOSORBIDE MONONITRATE ER 30 MG PO TB24: 30.0000 mg | ORAL_TABLET | Freq: Every day | ORAL | 3 refills | Status: DC

## 2018-05-17 MED ORDER — METOPROLOL TARTRATE 25 MG PO TABS: 25.0000 mg | ORAL_TABLET | Freq: Two times a day (BID) | ORAL | 3 refills | Status: DC | PRN

## 2018-05-17 NOTE — Patient Instructions (Signed)
Medication Instructions:  Please restart Isosorbide 30 mg a day. You may take Metoprolol tartrate 25 mg up to twice a day for rapid heart rate.  Continue all other medications as listed.  Testing/Procedures: Your physician has requested that you have an echocardiogram. Echocardiography is a painless test that uses sound waves to create images of your heart. It provides your doctor with information about the size and shape of your heart and how well your heart's chambers and valves are working. This procedure takes approximately one hour. There are no restrictions for this procedure.  Follow-Up: Follow up in 6 months with Dr. Marlou Porch.  You will receive a letter in the mail 2 months before you are due.  Please call us when you receive this letter to schedule your follow up appointment.  If you need a refill on your cardiac medications before your next appointment, please call your pharmacy.  Thank you for choosing [facility]!!

## 2018-05-17 NOTE — Progress Notes (Signed)
[name]. [address], [place]  [address] Phone: [phone] Fax:  [phone]  Date:  05/17/2018   ID:  Barbara Russell, DOB 1930/03/25, MRN [id number]  PCP:  Seward Carol, MD   History of Present Illness: Barbara Russell is a 84 y.o. female who has a history of hypertension, diabetes, normal stress test in 06/2013 low risk with remote history of CAD documented in the past with myocardial infarction in the year 2000. She has been intolerant of statins.  She also had a several year history of pain/shortness of breath radiating to her neck that was described as moderate severity. At that time she did not wish to take Lasix. She was worried about worsening leg cramping.  Diarrhea for years, lactose intolerant. Main complaint. No hiatal hernia.  Saw my mother at wedding in [place].   Previously if goes up hill has pain that goes up to neck.Chasing dog feels same thing. Knitting same thing. Imdur has helped.   04/07/14 - Chest pain has gone away. Still occasionally he may feel some pain behind her ears but this is rare. She is doing very well with her medications. Echocardiogram showed mild aortic stenosis. Normal EF.  11/26/14-she will occasionally have neck pain when brushing after her dogs. Most of her complaints are orthopedic. She is not describing any significant change in shortness of breath. Doing well.  05/12/15 - Woke up felt sharp pain in head. 220/95 in night BP. Took her to ER. Dr. Delfina Redwood looked at BP log. Eventually came back down. In the distant past she has some orthostatic hypotension. She's not having any chest pain, no shortness of breath. She had previously a right knee replacement.  11/08/15-overall doing well. She had a fleeting chest discomfort in the middle the night, resolved on its own. Occasionally she will have burning sensation with her nitroglycerin under her tongue.  05/10/16-overall doing well. Fleeting chest pain at times. Takes  nitroglycerin. She is not feeling any significant shortness of breath. She did have a fall on her flagstone. Hurt her lip but no significant bleeding. She still has a residual "bump" in that region. No syncope.  05/16/17 - week ago took NTG x 2, elephant on chest. Non exertional. This happened at night. Now having back pain, Dr. Nelva Bush. Trying Dexamethasone cream on patch and this helps. No shortness of breath, no syncope, no bleeding. Barbara Russell is her grandson  11/22/17 - stopped isosorbide.  She feels better.  She has not had any further episodes of angina.  She just turned 54 and feels a little bit slower than she used to but she had 7 people over to her house for bridge and cooked for them.  No syncope bleeding orthopnea PND  05/17/2018- she did have another bout of chest discomfort, went to the emergency room.  Troponins were normal.  Remember, she stopped isosorbide prior to last visit. Had HR increase when preparing for party. BP monitor 110 HR. Faster. Pressure. 2 weeks ago woke up chest pressure. Recent leg cramps.  She has not had another episode of the rapid heartbeat.  She does not think it was related to anxiety.  Wt Readings from Last 3 Encounters:  05/17/18 145 lb (65.8 kg)  04/13/18 145 lb (65.8 kg)  11/22/17 147 lb 6.4 oz (66.9 kg)     Past Medical History:  Diagnosis Date  . Aortic heart murmur    aortic sclerosis w/out and significant stenosis  .  Breast cancer ([facility])    Hx of BC, s/p mastectomy on right 1999, s/p tamoxifen X1 year  . Chest pain    EC Stress test 12/21/11 Low risk, no ischemia, normal EF  . Coronary artery disease    Remote Hx of CAD, normal stress test 2009  . Diabetes mellitus without complication ([facility])   . Dyspnea   . Hypercholesteremia   . Hypertension   . Lichen sclerosus   . Low back pain   . Myocardial infarction ([facility]) 2000  . Osteoarthrosis, unspecified whether generalized or localized, unspecified site   . Pain    Several year Hx of pain/ SOB  radiating to her neck  . SOB (shortness of breath)     Past Surgical History:  Procedure Laterality Date  . APPENDECTOMY    . HAND SURGERY Bilateral    CTS and trigger finger on right and left  . MASTECTOMY Right 1999  . REPLACEMENT TOTAL KNEE Right 11/2009  . VESICOVAGINAL FISTULA CLOSURE W/ TAH  1969    Current Outpatient Medications  Medication Sig Dispense Refill  . aspirin 81 MG tablet Take 81 mg by mouth daily.    Marland Kitchen b complex vitamins tablet Take 1 tablet by mouth daily.    . cetirizine (ZYRTEC) 10 MG tablet Take 10 mg by mouth daily as needed for allergies.     . cholecalciferol (VITAMIN D) 1000 UNITS tablet Take 1,000 Units by mouth daily.     . furosemide (LASIX) 20 MG tablet Take 20 mg by mouth daily.     Marland Kitchen gabapentin (NEURONTIN) 300 MG capsule Take 600 mg by mouth at bedtime.     Marland Kitchen losartan (COZAAR) 25 MG tablet Take 25 mg by mouth daily.     . metFORMIN (GLUCOPHAGE) 500 MG tablet Take 500 mg by mouth daily at 12 noon.    . nitroGLYCERIN (NITROSTAT) 0.4 MG SL tablet Place 1 tablet (0.4 mg total) under the tongue every 5 (five) minutes as needed for chest pain. 25 tablet 1  . Omega-3 Fatty Acids (FISH OIL) 1200 MG CAPS Take 1,200 mg by mouth daily.    . potassium gluconate 595 MG TABS tablet Take 595 mg by mouth at bedtime.    Marland Kitchen zolpidem (AMBIEN) 5 MG tablet Take 5 mg by mouth at bedtime.      No current facility-administered medications for this visit.     Allergies:    Allergies  Allergen Reactions  . Benadryl [Diphenhydramine Hcl (Sleep)] Other (See Comments)    Hyperstimulation  . Ciprofloxacin Hives  . Lactose Intolerance (Gi) Diarrhea  . Macrobid [Nitrofurantoin Monohyd Macro] Nausea And Vomiting  . Naproxen Other (See Comments)    Stomach pain  . Quinine Sulfate [Quinine] Hives  . Sulfa Antibiotics Hives       . Tramadol Other (See Comments)    unknown to patient  . Penicillins Rash    Has patient had a PCN reaction causing immediate rash,  facial/tongue/throat swelling, SOB or lightheadedness with hypotension: Yes Has patient had a PCN reaction causing severe rash involving mucus membranes or skin necrosis: No Has patient had a PCN reaction that required hospitalization: No Has patient had a PCN reaction occurring within the last 10 years: No If all of the above answers are "NO", then may proceed with Cephalosporin use.   . Prednisone Other (See Comments)    Weakness and pain in joints    Social History:  The patient  reports that she has never smoked. She  has never used smokeless tobacco. She reports that she does not drink alcohol or use drugs.   ROS:  Please see the history of present illness.   All other review of systems negative PHYSICAL EXAM: VS:  BP (!) 150/76 (BP Location: Left Arm, Patient Position: Sitting, Cuff Size: Normal)   Pulse 65   Ht 5\' 3"  (1.6 m)   Wt 145 lb (65.8 kg)   LMP  (LMP Unknown)   SpO2 96%   BMI 25.69 kg/m  GEN: Well nourished, well developed, in no acute distress  HEENT: normal  Neck: no JVD, carotid bruits, or masses Cardiac: RRR; 2/6 murmur, no rubs, or gallops,no edema  Respiratory:  clear to auscultation bilaterally, normal work of breathing GI: soft, nontender, nondistended, + BS MS: no deformity or atrophy  Skin: warm and dry, no rash Neuro:  Alert and Oriented x 3, Strength and sensation are intact Psych: euthymic mood, full affect   EKG: EKG in the emergency room in May 2019 personally reviewed, sinus rhythm no ischemic changes 11/22/17-sinus rhythm 33 old septal infarct pattern personally viewed-prior 11/14/16-sinus rhythm, 64, poor R-wave progression consistent with old anteroseptal infarct pattern personally viewed no significant change from prior- 11/08/15-sinus bradycardia rate 58, poor R-wave progression old septal infarct pattern personally viewed-no change from prior 11/26/14-Sinus rhythm rate 72 with old septal infarct pattern, poor R wave progression   no change from  prior EKG  Echocardiogram: 01/22/14 - Left ventricle: The cavity size was normal. Wall thickness was increased in a pattern of mild LVH. Systolic function was vigorous. The estimated ejection fraction was in the range of 65% to 70%. Wall motion was normal; there were no regional wall motion abnormalities. Doppler parameters are consistent with abnormal left ventricular relaxation (grade 1 diastolic dysfunction). Doppler parameters are consistent with high ventricular filling pressure. - Aortic valve: Cusp separation was reduced. There was mild stenosis. Peak velocity: 259cm/s (S). Mean gradient: 55mm Hg (S). - Mitral valve: Moderately calcified annulus. - Left atrium: The atrium was mildly dilated.  Nuclear stress test 7/14-low risk, no ischemia   ECHO 06/26/16: - Left ventricle: The cavity size was normal. There was moderate   focal basal and mild concentric hypertrophy. Systolic function   was normal. The estimated ejection fraction was in the range of   60% to 65%. Features are consistent with a pseudonormal left   ventricular filling pattern, with concomitant abnormal relaxation   and increased filling pressure (grade 2 diastolic dysfunction).   Doppler parameters are consistent with high ventricular filling   pressure. - Aortic valve: Severe diffuse thickening and calcification. Valve   mobility was restricted. There was mild stenosis. - Mitral valve: Severely calcified annulus. There was mild   regurgitation. - Atrial septum: There was increased thickness of the septum,   consistent with lipomatous hypertrophy.  ASSESSMENT AND PLAN:  1. Old myocardial infarction-year 2000, 3 months after her husband Joneen Caraway died.  Last stress test in Jul 26, 2013 was reassuring. Low risk.  We discussed the possibility of statin medication once again, she did have some myalgias previously. We decided not to pursue. May be hiatal hernia. 2. Tachycardia-palpitations.  Metoprolol as  needed. 3. Angina-we will go ahead and resume/restart her isosorbide 30 mg.  Hopefully this will help.  I will also give her metoprolol tartrate 25 mg twice a day to take as needed.  Last stress test reassuring. EF reassuring 4. Coronary artery disease- remote history in past. Last stress test low risk.  Changes made, secondary  prevention 5. Diabetes-per Dr. Delfina Redwood.  Doing well.  Hemoglobin A1c 6.4.  No changes 6. Mild aortic stenosis. Crescendo, decrescendo murmur once again heard.  We will go ahead and check an echocardiogram since it is been 2 years.   7. We'll see back in 6 months.  Signed, Candee Furbish, MD [facility]  05/17/2018 9:59 AM

## 2018-05-30 ENCOUNTER — Ambulatory Visit (HOSPITAL_COMMUNITY): Payer: Medicare Other | Attending: Cardiology

## 2018-05-30 ENCOUNTER — Other Ambulatory Visit: Payer: Self-pay

## 2018-05-30 DIAGNOSIS — I350 Nonrheumatic aortic (valve) stenosis: Secondary | ICD-10-CM | POA: Insufficient documentation

## 2018-05-30 DIAGNOSIS — I10 Essential (primary) hypertension: Secondary | ICD-10-CM | POA: Insufficient documentation

## 2018-05-30 DIAGNOSIS — E785 Hyperlipidemia, unspecified: Secondary | ICD-10-CM | POA: Diagnosis not present

## 2018-05-30 DIAGNOSIS — I219 Acute myocardial infarction, unspecified: Secondary | ICD-10-CM | POA: Insufficient documentation

## 2018-05-30 DIAGNOSIS — I2510 Atherosclerotic heart disease of native coronary artery without angina pectoris: Secondary | ICD-10-CM | POA: Insufficient documentation

## 2018-08-31 DIAGNOSIS — M1711 Unilateral primary osteoarthritis, right knee: Secondary | ICD-10-CM | POA: Insufficient documentation

## 2018-08-31 DIAGNOSIS — M25561 Pain in right knee: Secondary | ICD-10-CM | POA: Diagnosis not present

## 2018-09-04 DIAGNOSIS — E7800 Pure hypercholesterolemia, unspecified: Secondary | ICD-10-CM | POA: Diagnosis not present

## 2018-09-04 DIAGNOSIS — E1165 Type 2 diabetes mellitus with hyperglycemia: Secondary | ICD-10-CM | POA: Diagnosis not present

## 2018-09-04 DIAGNOSIS — Z7984 Long term (current) use of oral hypoglycemic drugs: Secondary | ICD-10-CM | POA: Diagnosis not present

## 2018-09-04 DIAGNOSIS — G4700 Insomnia, unspecified: Secondary | ICD-10-CM | POA: Diagnosis not present

## 2018-09-04 DIAGNOSIS — Z23 Encounter for immunization: Secondary | ICD-10-CM | POA: Diagnosis not present

## 2018-09-04 DIAGNOSIS — R197 Diarrhea, unspecified: Secondary | ICD-10-CM | POA: Diagnosis not present

## 2018-09-04 DIAGNOSIS — I2510 Atherosclerotic heart disease of native coronary artery without angina pectoris: Secondary | ICD-10-CM | POA: Diagnosis not present

## 2018-09-04 DIAGNOSIS — Z0000 Encounter for general adult medical examination without abnormal findings: Secondary | ICD-10-CM | POA: Diagnosis not present

## 2018-09-04 DIAGNOSIS — E1122 Type 2 diabetes mellitus with diabetic chronic kidney disease: Secondary | ICD-10-CM | POA: Diagnosis not present

## 2018-09-04 DIAGNOSIS — Z1389 Encounter for screening for other disorder: Secondary | ICD-10-CM | POA: Diagnosis not present

## 2018-09-04 DIAGNOSIS — I10 Essential (primary) hypertension: Secondary | ICD-10-CM | POA: Diagnosis not present

## 2018-09-13 DIAGNOSIS — K649 Unspecified hemorrhoids: Secondary | ICD-10-CM | POA: Diagnosis not present

## 2018-11-19 ENCOUNTER — Encounter: Payer: Self-pay | Admitting: Cardiology

## 2018-11-19 ENCOUNTER — Ambulatory Visit (INDEPENDENT_AMBULATORY_CARE_PROVIDER_SITE_OTHER): Payer: Medicare Other | Admitting: Cardiology

## 2018-11-19 VITALS — BP 140/54 | HR 66 | Ht 63.0 in | Wt 143.8 lb

## 2018-11-19 DIAGNOSIS — I350 Nonrheumatic aortic (valve) stenosis: Secondary | ICD-10-CM

## 2018-11-19 DIAGNOSIS — I2510 Atherosclerotic heart disease of native coronary artery without angina pectoris: Secondary | ICD-10-CM | POA: Diagnosis not present

## 2018-11-19 DIAGNOSIS — I208 Other forms of angina pectoris: Secondary | ICD-10-CM

## 2018-11-19 DIAGNOSIS — I252 Old myocardial infarction: Secondary | ICD-10-CM | POA: Diagnosis not present

## 2018-11-19 DIAGNOSIS — I2583 Coronary atherosclerosis due to lipid rich plaque: Secondary | ICD-10-CM

## 2018-11-19 NOTE — Patient Instructions (Signed)
Medication Instructions:  The current medical regimen is effective;  continue present plan and medications.  If you need a refill on your cardiac medications before your next appointment, please call your pharmacy.   Follow-Up: At [facility], you and your health needs are our priority.  As part of our continuing mission to provide you with exceptional heart care, we have created designated Provider Care Teams.  These Care Teams include your primary Cardiologist (physician) and Advanced Practice Providers (APPs -  Physician Assistants and Nurse Practitioners) who all work together to provide you with the care you need, when you need it. You will need a follow up appointment in 6 months.  Please call our office 2 months in advance to schedule this appointment.  You may see Mark Skains, MD or one of the following Advanced Practice Providers on your designated Care Team:   Lori Gerhardt, NP Laura Ingold, NP . Jill McDaniel, NP  Thank you for choosing [facility]!!       

## 2018-11-19 NOTE — Progress Notes (Signed)
[facility] [address], [address], [place]  [address] Phone: [phone] Fax:  [phone]  Date:  11/19/2018   ID:  Barbara Russell, DOB [birth year], MRN [id number]  PCP:  Seward Carol, MD   History of Present Illness: Barbara Russell is a 84 y.o. female who has a history of hypertension, diabetes, normal stress test in 06/2013 low risk with remote history of CAD documented in the past with myocardial infarction in the year 2000. She has been intolerant of statins.  She also had a several year history of pain/shortness of breath radiating to her neck that was described as moderate severity. At that time she did not wish to take Lasix. She was worried about worsening leg cramping.  Diarrhea for years, lactose intolerant. Main complaint. No hiatal hernia.  Saw my mother at wedding in [place].   Previously if goes up hill has pain that goes up to neck.Chasing dog feels same thing. Knitting same thing. Imdur has helped.   04/07/14 - Chest pain has gone away. Still occasionally he may feel some pain behind her ears but this is rare. She is doing very well with her medications. Echocardiogram showed mild aortic stenosis. Normal EF.  11/26/14-she will occasionally have neck pain when brushing after her dogs. Most of her complaints are orthopedic. She is not describing any significant change in shortness of breath. Doing well.  05/12/15 - Woke up felt sharp pain in head. 220/95 in night BP. Took her to ER. Dr. Delfina Redwood looked at BP log. Eventually came back down. In the distant past she has some orthostatic hypotension. She's not having any chest pain, no shortness of breath. She had previously a right knee replacement.  11/08/15-overall doing well. She had a fleeting chest discomfort in the middle the night, resolved on its own. Occasionally she will have burning sensation with her nitroglycerin under her tongue.  05/10/16-overall doing well. Fleeting chest pain at times. Takes  nitroglycerin. She is not feeling any significant shortness of breath. She did have a fall on her flagstone. Hurt her lip but no significant bleeding. She still has a residual "bump" in that region. No syncope.  05/16/17 - week ago took NTG x 2, elephant on chest. Non exertional. This happened at night. Now having back pain, Dr. Nelva Bush. Trying Dexamethasone cream on patch and this helps. No shortness of breath, no syncope, no bleeding. Baltazar Najjar is her grandson  11/22/17 - stopped isosorbide.  She feels better.  She has not had any further episodes of angina.  She just turned 44 and feels a little bit slower than she used to but she had 7 people over to her house for bridge and cooked for them.  No syncope bleeding orthopnea PND  05/17/2018- she did have another bout of chest discomfort, went to the emergency room.  Troponins were normal.  Remember, she stopped isosorbide prior to last visit. Had HR increase when preparing for party. BP monitor 110 HR. Faster. Pressure. 2 weeks ago woke up chest pressure. Recent leg cramps.  She has not had another episode of the rapid heartbeat.  She does not think it was related to anxiety.  11/19/2018- chief complaint- follow-up of prior chest pain. Put back on Imdur last time. Went on it in AM. Got dizzy. Little low. She stopped it. Doing well.  Has not needed metoprolol PRN.   Wt Readings from Last 3 Encounters:  11/19/18 143 lb 12.8 oz (65.2 kg)  05/17/18  145 lb (65.8 kg)  04/13/18 145 lb (65.8 kg)     Past Medical History:  Diagnosis Date  . Aortic heart murmur    aortic sclerosis w/out and significant stenosis  . Breast cancer ([place])    Hx of BC, s/p mastectomy on right 1999, s/p tamoxifen X1 year  . Chest pain    EC Stress test 12/21/11 Low risk, no ischemia, normal EF  . Coronary artery disease    Remote Hx of CAD, normal stress test 2009  . Diabetes mellitus without complication ([place])   . Dyspnea   . Hypercholesteremia   . Hypertension   . Lichen  sclerosus   . Low back pain   . Myocardial infarction ([place]) 2000  . Osteoarthrosis, unspecified whether generalized or localized, unspecified site   . Pain    Several year Hx of pain/ SOB radiating to her neck  . SOB (shortness of breath)     Past Surgical History:  Procedure Laterality Date  . APPENDECTOMY    . HAND SURGERY Bilateral    CTS and trigger finger on right and left  . MASTECTOMY Right 1999  . REPLACEMENT TOTAL KNEE Right 11/2009  . VESICOVAGINAL FISTULA CLOSURE W/ TAH  1969    Current Outpatient Medications  Medication Sig Dispense Refill  . aspirin 81 MG tablet Take 81 mg by mouth daily.    Marland Kitchen b complex vitamins tablet Take 1 tablet by mouth daily.    . cetirizine (ZYRTEC) 10 MG tablet Take 10 mg by mouth daily as needed for allergies.     . cholecalciferol (VITAMIN D) 1000 UNITS tablet Take 1,000 Units by mouth daily.     . furosemide (LASIX) 20 MG tablet Take 20 mg by mouth daily.     Marland Kitchen gabapentin (NEURONTIN) 300 MG capsule Take 600 mg by mouth at bedtime.     Marland Kitchen losartan (COZAAR) 25 MG tablet Take 25 mg by mouth daily.     . metFORMIN (GLUCOPHAGE) 500 MG tablet Take 500 mg by mouth daily at 12 noon.    . metoprolol tartrate (LOPRESSOR) 25 MG tablet Take 1 tablet (25 mg total) by mouth 2 (two) times daily as needed. For rapid heartrate 60 tablet 3  . nitroGLYCERIN (NITROSTAT) 0.4 MG SL tablet Place 1 tablet (0.4 mg total) under the tongue every 5 (five) minutes as needed for chest pain. 25 tablet 1  . Omega-3 Fatty Acids (FISH OIL) 1200 MG CAPS Take 1,200 mg by mouth daily.    . ondansetron (ZOFRAN) 4 MG tablet Take 4 mg by mouth as needed.    . potassium gluconate 595 MG TABS tablet Take 595 mg by mouth at bedtime.    Marland Kitchen zolpidem (AMBIEN) 5 MG tablet Take 5 mg by mouth at bedtime.      No current facility-administered medications for this visit.     Allergies:    Allergies  Allergen Reactions  . Benadryl [Diphenhydramine Hcl (Sleep)] Other (See Comments)     Hyperstimulation  . Ciprofloxacin Hives  . Lactose Intolerance (Gi) Diarrhea  . Macrobid [Nitrofurantoin Monohyd Macro] Nausea And Vomiting  . Naproxen Other (See Comments)    Stomach pain  . Quinine Sulfate [Quinine] Hives  . Sulfa Antibiotics Hives       . Tramadol Other (See Comments)    unknown to patient  . Penicillins Rash    Has patient had a PCN reaction causing immediate rash, facial/tongue/throat swelling, SOB or lightheadedness with hypotension: Yes Has patient had a  PCN reaction causing severe rash involving mucus membranes or skin necrosis: No Has patient had a PCN reaction that required hospitalization: No Has patient had a PCN reaction occurring within the last 10 years: No If all of the above answers are "NO", then may proceed with Cephalosporin use.   . Prednisone Other (See Comments)    Weakness and pain in joints    Social History:  The patient  reports that she has never smoked. She has never used smokeless tobacco. She reports that she does not drink alcohol or use drugs.   ROS:  Please see the history of present illness.   All other review of systems negative PHYSICAL EXAM: VS:  BP (!) 140/54   Pulse 66   Ht 5\' 3"  (1.6 m)   Wt 143 lb 12.8 oz (65.2 kg)   LMP  (LMP Unknown)   SpO2 94%   BMI 25.47 kg/m  GEN: Well nourished, well developed, in no acute distress  HEENT: normal  Neck: no JVD, carotid bruits, or masses Cardiac: RRR; n2/6 SM, no rubs, or gallops,no edema  Respiratory:  clear to auscultation bilaterally, normal work of breathing GI: soft, nontender, nondistended, + BS MS: no deformity or atrophy  Skin: warm and dry, no rash Neuro:  Alert and Oriented x 3, Strength and sensation are intact Psych: euthymic mood, full affect    EKG: EKG in the emergency room in May 2019 personally reviewed, sinus rhythm no ischemic changes 11/22/17-sinus rhythm 21 old septal infarct pattern personally viewed-prior 11/14/16-sinus rhythm, 64, poor R-wave  progression consistent with old anteroseptal infarct pattern personally viewed no significant change from prior- 11/08/15-sinus bradycardia rate 58, poor R-wave progression old septal infarct pattern personally viewed-no change from prior 11/26/14-Sinus rhythm rate 72 with old septal infarct pattern, poor R wave progression   no change from prior EKG  Echocardiogram: 01/22/14 - Left ventricle: The cavity size was normal. Wall thickness was increased in a pattern of mild LVH. Systolic function was vigorous. The estimated ejection fraction was in the range of 65% to 70%. Wall motion was normal; there were no regional wall motion abnormalities. Doppler parameters are consistent with abnormal left ventricular relaxation (grade 1 diastolic dysfunction). Doppler parameters are consistent with high ventricular filling pressure. - Aortic valve: Cusp separation was reduced. There was mild stenosis. Peak velocity: 259cm/s (S). Mean gradient: 40mm Hg (S). - Mitral valve: Moderately calcified annulus. - Left atrium: The atrium was mildly dilated.  Nuclear stress test 7/14-low risk, no ischemia   ECHO 06/26/16: - Left ventricle: The cavity size was normal. There was moderate   focal basal and mild concentric hypertrophy. Systolic function   was normal. The estimated ejection fraction was in the range of   60% to 65%. Features are consistent with a pseudonormal left   ventricular filling pattern, with concomitant abnormal relaxation   and increased filling pressure (grade 2 diastolic dysfunction).   Doppler parameters are consistent with high ventricular filling   pressure. - Aortic valve: Severe diffuse thickening and calcification. Valve   mobility was restricted. There was mild stenosis. - Mitral valve: Severely calcified annulus. There was mild   regurgitation. - Atrial septum: There was increased thickness of the septum,   consistent with lipomatous hypertrophy.   ECHO  05/30/18: - Left ventricle: The cavity size was normal. Wall thickness was   increased in a pattern of moderate LVH. Systolic function was   vigorous. The estimated ejection fraction was in the range of 65%  to 70%. Wall motion was normal; there were no regional wall   motion abnormalities. Doppler parameters are consistent with   abnormal left ventricular relaxation (grade 1 diastolic   dysfunction). Doppler parameters are consistent with high   ventricular filling pressure. - Aortic valve: Trileaflet; moderately thickened, moderately   calcified leaflets. Valve mobility was restricted. There was mild   stenosis. Peak velocity (S): 296 cm/s. - Mitral valve: Severely calcified annulus. - Left atrium: The atrium was mildly dilated. - Pericardium, extracardiac: A trivial pericardial effusion was   identified.   ASSESSMENT AND PLAN:  1. Old myocardial infarction-year 2000, 3 months after her husband Joneen Caraway died.  Last stress test in Aug 02, 2013 was reassuring. Low risk.  Does not wish to take statin medication.  LDL is 156.  Creatinine 0.8 2. Tachycardia-palpitations.  Metoprolol as needed.  Has not needed to take recently 3. Angina-she stopped her isosorbide low-dose on her own volition after feeling some dizziness.  She overall is doing quite well without any chest pain.  Last stress test reassuring. EF reassuring 4. Coronary artery disease- remote history in past. Last stress test low risk.  Overall doing quite well.  No changes made.  5. Diabetes-per Dr. Delfina Redwood.  Doing well.  Hemoglobin A1c 6.4.  No changes, excellent. 6. Mild aortic stenosis. Crescendo, decrescendo murmur once again heard.  2019 echocardiogram still shows mild aortic stenosis.   7. We'll see back in 6 months.  Signed, Candee Furbish, MD [facility]  11/19/2018 12:05 PM

## 2019-02-21 DIAGNOSIS — I2510 Atherosclerotic heart disease of native coronary artery without angina pectoris: Secondary | ICD-10-CM | POA: Diagnosis not present

## 2019-02-21 DIAGNOSIS — E7800 Pure hypercholesterolemia, unspecified: Secondary | ICD-10-CM | POA: Diagnosis not present

## 2019-02-21 DIAGNOSIS — E1169 Type 2 diabetes mellitus with other specified complication: Secondary | ICD-10-CM | POA: Diagnosis not present

## 2019-02-21 DIAGNOSIS — I10 Essential (primary) hypertension: Secondary | ICD-10-CM | POA: Diagnosis not present

## 2019-02-21 DIAGNOSIS — G4700 Insomnia, unspecified: Secondary | ICD-10-CM | POA: Diagnosis not present

## 2019-05-29 ENCOUNTER — Telehealth: Payer: Self-pay

## 2019-05-29 NOTE — Telephone Encounter (Signed)

## 2019-05-30 ENCOUNTER — Telehealth (INDEPENDENT_AMBULATORY_CARE_PROVIDER_SITE_OTHER): Payer: Medicare Other | Admitting: Cardiology

## 2019-05-30 ENCOUNTER — Encounter: Payer: Self-pay | Admitting: Cardiology

## 2019-05-30 ENCOUNTER — Other Ambulatory Visit: Payer: Self-pay

## 2019-05-30 VITALS — BP 148/69 | HR 61 | Ht 63.0 in | Wt 143.0 lb

## 2019-05-30 DIAGNOSIS — I252 Old myocardial infarction: Secondary | ICD-10-CM

## 2019-05-30 DIAGNOSIS — I2510 Atherosclerotic heart disease of native coronary artery without angina pectoris: Secondary | ICD-10-CM

## 2019-05-30 DIAGNOSIS — I2583 Coronary atherosclerosis due to lipid rich plaque: Secondary | ICD-10-CM

## 2019-05-30 DIAGNOSIS — I350 Nonrheumatic aortic (valve) stenosis: Secondary | ICD-10-CM

## 2019-05-30 NOTE — Progress Notes (Signed)
Virtual Visit via Telephone Note   This visit type was conducted due to national recommendations for restrictions regarding the COVID-19 Pandemic (e.g. social distancing) in an effort to limit this patient's exposure and mitigate transmission in our community.  Due to her co-morbid illnesses, this patient is at least at moderate risk for complications without adequate follow up.  This format is felt to be most appropriate for this patient at this time.  The patient did not have access to video technology/had technical difficulties with video requiring transitioning to audio format only (telephone).  All issues noted in this document were discussed and addressed.  No physical exam could be performed with this format.  Please refer to the patient's chart for her  consent to telehealth for [facility].   Date:  05/30/2019   ID:  Barbara Russell, DOB 1930-10-06, MRN [id number]  Patient Location: Home Provider Location: Home  PCP:  Seward Carol, MD  Cardiologist:  Candee Furbish, MD  Electrophysiologist:  None   Evaluation Performed:  Follow-Up Visit  Chief Complaint:  HTN  History of Present Illness:    Barbara Russell is a 85 y.o. female with history of hypertension, diabetes, normal stress test in 06/2013 low risk with remote history of CAD documented in the past with myocardial infarction in the year 2000. She has been intolerant of statins.  She also had a several year history of pain/shortness of breath radiating to her neck that was described as moderate severity. At that time she did not wish to take Lasix. She was worried about worsening leg cramping.  Diarrhea for years, lactose intolerant. Main complaint. No hiatal hernia.  Saw my mother at wedding in [place].   Previously if goes up hill has pain that goes up to neck.Chasing dog feels same thing. Knitting same thing. Imdur has helped.   04/07/14 - Chest pain has gone away. Still occasionally he may feel some pain behind  her ears but this is rare. She is doing very well with her medications. Echocardiogram showed mild aortic stenosis. Normal EF.  11/26/14-she will occasionally have neck pain when brushing after her dogs. Most of her complaints are orthopedic. She is not describing any significant change in shortness of breath. Doing well.  05/12/15 - Woke up felt sharp pain in head. 220/95 in night BP. Took her to ER. Dr. Delfina Redwood looked at BP log. Eventually came back down. In the distant past she has some orthostatic hypotension. She's not having any chest pain, no shortness of breath. She had previously a right knee replacement.  11/08/15-overall doing well. She had a fleeting chest discomfort in the middle the night, resolved on its own. Occasionally she will have burning sensation with her nitroglycerin under her tongue.  05/10/16-overall doing well. Fleeting chest pain at times. Takes nitroglycerin. She is not feeling any significant shortness of breath. She did have a fall on her flagstone. Hurt her lip but no significant bleeding. She still has a residual "bump" in that region. No syncope.  05/16/17 - week ago took NTG x 2, elephant on chest. Non exertional. This happened at night. Now having back pain, Dr. Nelva Bush. Trying Dexamethasone cream on patch and this helps. No shortness of breath, no syncope, no bleeding. Baltazar Najjar is her grandson  11/22/17 - stopped isosorbide.  She feels better.  She has not had any further episodes of angina.  She just turned 87 and feels a little bit slower than she used to but she had 7 people  over to her house for bridge and cooked for them.  No syncope bleeding orthopnea PND  05/17/2018- she did have another bout of chest discomfort, went to the emergency room.  Troponins were normal.  Remember, she stopped isosorbide prior to last visit. Had HR increase when preparing for party. BP monitor 110 HR. Faster. Pressure. 2 weeks ago woke up chest pressure. Recent leg cramps.  She has not  had another episode of the rapid heartbeat.  She does not think it was related to anxiety.  11/19/2018- chief complaint- follow-up of prior chest pain. Put back on Imdur last time. Went on it in AM. Got dizzy. Little low. She stopped it. Doing well.  Has not needed metoprolol PRN.   05/30/19 - Taking Imdur at night and BP has leveled off.  She was having some difficulty with blood pressure, Dr. polite has been working on this.  Overall doing quite well no fevers chills nausea vomiting syncope bleeding.    The patient does not have symptoms concerning for COVID-19 infection (fever, chills, cough, or new shortness of breath).    Past Medical History:  Diagnosis Date  . Aortic heart murmur    aortic sclerosis w/out and significant stenosis  . Breast cancer ([place])    Hx of BC, s/p mastectomy on right 1999, s/p tamoxifen X1 year  . Chest pain    EC Stress test 12/21/11 Low risk, no ischemia, normal EF  . Coronary artery disease    Remote Hx of CAD, normal stress test 2009  . Diabetes mellitus without complication ([place])   . Dyspnea   . Hypercholesteremia   . Hypertension   . Lichen sclerosus   . Low back pain   . Myocardial infarction ([place]) 2000  . Osteoarthrosis, unspecified whether generalized or localized, unspecified site   . Pain    Several year Hx of pain/ SOB radiating to her neck  . SOB (shortness of breath)    Past Surgical History:  Procedure Laterality Date  . APPENDECTOMY    . HAND SURGERY Bilateral    CTS and trigger finger on right and left  . MASTECTOMY Right 1999  . REPLACEMENT TOTAL KNEE Right 11/2009  . VESICOVAGINAL FISTULA CLOSURE W/ TAH  1969     Current Meds  Medication Sig  . aspirin 81 MG tablet Take 81 mg by mouth daily.  Marland Kitchen b complex vitamins tablet Take 1 tablet by mouth daily.  . cetirizine (ZYRTEC) 10 MG tablet Take 10 mg by mouth daily as needed for allergies.   . cholecalciferol (VITAMIN D) 1000 UNITS tablet Take 1,000 Units by mouth daily.   .  furosemide (LASIX) 20 MG tablet Take 20 mg by mouth daily.   Marland Kitchen gabapentin (NEURONTIN) 300 MG capsule Take 600 mg by mouth at bedtime.   . isosorbide mononitrate (IMDUR) 30 MG 24 hr tablet Take 30 mg by mouth at bedtime.  Marland Kitchen losartan (COZAAR) 25 MG tablet Take 25 mg by mouth daily.   . metFORMIN (GLUCOPHAGE) 500 MG tablet Take 500 mg by mouth daily at 12 noon.  . metoprolol tartrate (LOPRESSOR) 25 MG tablet Take 1 tablet (25 mg total) by mouth 2 (two) times daily as needed. For rapid heartrate  . nitroGLYCERIN (NITROSTAT) 0.4 MG SL tablet Place 1 tablet (0.4 mg total) under the tongue every 5 (five) minutes as needed for chest pain.  . Omega-3 Fatty Acids (FISH OIL) 1200 MG CAPS Take 1,200 mg by mouth daily.  . ondansetron (ZOFRAN) 4 MG  tablet Take 4 mg by mouth as needed.  . Potassium 99 MG TABS Take 99 mg by mouth daily.  Marland Kitchen zolpidem (AMBIEN) 5 MG tablet Take 5 mg by mouth at bedtime.      Allergies:   Benadryl [diphenhydramine hcl (sleep)], Ciprofloxacin, Lactose intolerance (gi), Macrobid [nitrofurantoin monohyd macro], Naproxen, Quinine sulfate [quinine], Sulfa antibiotics, Tramadol, Penicillins, and Prednisone   Social History   Tobacco Use  . Smoking status: Never Smoker  . Smokeless tobacco: Never Used  Substance Use Topics  . Alcohol use: No  . Drug use: No     Family Hx: The patient's family history includes Cancer in her mother; Heart disease in her father.  ROS:   Please see the history of present illness.     All other systems reviewed and are negative.   Prior CV studies:   The following studies were reviewed today:  Echocardiogram: 01/22/14 - Left ventricle: The cavity size was normal. Wall thickness was increased in a pattern of mild LVH. Systolic function was vigorous. The estimated ejection fraction was in the range of 65% to 70%. Wall motion was normal; there were no regional wall motion abnormalities. Doppler parameters are consistent with abnormal  left ventricular relaxation (grade 1 diastolic dysfunction). Doppler parameters are consistent with high ventricular filling pressure. - Aortic valve: Cusp separation was reduced. There was mild stenosis. Peak velocity: 259cm/s (S). Mean gradient: 53mm Hg (S). - Mitral valve: Moderately calcified annulus. - Left atrium: The atrium was mildly dilated.  Nuclear stress test 7/14-low risk, no ischemia   ECHO 06/26/16: - Left ventricle: The cavity size was normal. There was moderate focal basal and mild concentric hypertrophy. Systolic function was normal. The estimated ejection fraction was in the range of 60% to 65%. Features are consistent with a pseudonormal left ventricular filling pattern, with concomitant abnormal relaxation and increased filling pressure (grade 2 diastolic dysfunction). Doppler parameters are consistent with high ventricular filling pressure. - Aortic valve: Severe diffuse thickening and calcification. Valve mobility was restricted. There was mild stenosis. - Mitral valve: Severely calcified annulus. There was mild regurgitation. - Atrial septum: There was increased thickness of the septum, consistent with lipomatous hypertrophy.   ECHO 05/30/18: - Left ventricle: The cavity size was normal. Wall thickness was increased in a pattern of moderate LVH. Systolic function was vigorous. The estimated ejection fraction was in the range of 65% to 70%. Wall motion was normal; there were no regional wall motion abnormalities. Doppler parameters are consistent with abnormal left ventricular relaxation (grade 1 diastolic dysfunction). Doppler parameters are consistent with high ventricular filling pressure. - Aortic valve: Trileaflet; moderately thickened, moderately calcified leaflets. Valve mobility was restricted. There was mild stenosis. Peak velocity (S): 296 cm/s. - Mitral valve: Severely calcified annulus. - Left  atrium: The atrium was mildly dilated. - Pericardium, extracardiac: A trivial pericardial effusion was identified.  Labs/Other Tests and Data Reviewed:    May 2019 personally reviewed, sinus rhythm no ischemic changes 11/22/17-sinus rhythm 57 old septal infarct pattern personally viewed-prior 11/14/16-sinus rhythm, 64, poor R-wave progression consistent with old anteroseptal infarct pattern personally viewed no significant change from prior- 11/08/15-sinus bradycardia rate 58, poor R-wave progression old septal infarct pattern personally viewed-no change from prior 11/26/14-Sinus rhythm rate 72 with old septal infarct pattern, poor R wave progression   no change from prior EKG    Recent Labs: No results found for requested labs within last 8760 hours.   Recent Lipid Panel No results found for: CHOL, TRIG, HDL,  CHOLHDL, LDLCALC, LDLDIRECT  Wt Readings from Last 3 Encounters:  05/30/19 143 lb (64.9 kg)  11/19/18 143 lb 12.8 oz (65.2 kg)  05/17/18 145 lb (65.8 kg)     Objective:    Vital Signs:  BP (!) 148/69   Pulse 61   Ht 5\' 3"  (1.6 m)   Wt 143 lb (64.9 kg)   LMP  (LMP Unknown)   BMI 25.33 kg/m    VITAL SIGNS:  reviewed pleasant, feeling well, alert  ASSESSMENT & PLAN:    Old myocardial infarction-year 2000, 3 months after her husband Joneen Caraway died.  Last stress test in July 30, 2013 was reassuring. Low risk.  Does not wish to take statin medication.  LDL is 156.  Creatinine 0.8  Tachycardia-palpitations.  Metoprolol as needed.  Has not needed to take recently. Doing well. Angina-she stopped her isosorbide low-dose on her own volition after feeling some dizziness.  She overall is doing quite well without any chest pain.  Last stress test reassuring. EF reassuring  Coronary artery disease- remote history in past. Last stress test low risk. No changes. Meds reviewed.   Diabetes-per Dr. Delfina Redwood.  Doing well.  Hemoglobin A1c 6.4.  No changes, excellent.  Esstential HTN - 121/61 this AM.  Better, before BP meds. Swings back and forth. Tried Isosorbide previously but light headed . Now back on it.   Mild aortic stenosis. Crescendo, decrescendo murmur.  2019 echocardiogram still shows mild aortic stenosis.    We'll see back in 6 months.   COVID-19 Education: The signs and symptoms of COVID-19 were discussed with the patient and how to seek care for testing (follow up with PCP or arrange E-visit).  The importance of social distancing was discussed today.  Time:   Today, I have spent 16 minutes with the patient with telehealth technology discussing the above problems.     Medication Adjustments/Labs and Tests Ordered: Current medicines are reviewed at length with the patient today.  Concerns regarding medicines are outlined above.   Tests Ordered: No orders of the defined types were placed in this encounter.   Medication Changes: No orders of the defined types were placed in this encounter.   Follow Up:  Virtual Visit or In Person in 6 month(s)  Signed, Candee Furbish, MD  05/30/2019 9:37 AM    [place]

## 2019-05-30 NOTE — Patient Instructions (Signed)
Medication Instructions:  Please restart Isosorbide 30 mg at bedtime.  Continue all other medications as listed.  If you need a refill on your cardiac medications before your next appointment, please call your pharmacy.   Follow-Up: At [facility], you and your health needs are our priority.  As part of our continuing mission to provide you with exceptional heart care, we have created designated Provider Care Teams.  These Care Teams include your primary Cardiologist (physician) and Advanced Practice Providers (APPs -  Physician Assistants and Nurse Practitioners) who all work together to provide you with the care you need, when you need it. You will need a follow up appointment in 12 months.  Please call our office 2 months in advance to schedule this appointment.  You may see Candee Furbish, MD or one of the following Advanced Practice Providers on your designated Care Team:   Truitt Merle, NP Cecilie Kicks, NP . Kathyrn Drown, NP  Thank you for choosing [facility]!!

## 2019-08-10 ENCOUNTER — Other Ambulatory Visit: Payer: Self-pay | Admitting: Cardiology

## 2019-08-13 ENCOUNTER — Other Ambulatory Visit: Payer: Self-pay | Admitting: Cardiology

## 2019-12-21 DIAGNOSIS — I10 Essential (primary) hypertension: Secondary | ICD-10-CM | POA: Diagnosis not present

## 2019-12-21 DIAGNOSIS — Z7984 Long term (current) use of oral hypoglycemic drugs: Secondary | ICD-10-CM | POA: Diagnosis not present

## 2019-12-21 DIAGNOSIS — E119 Type 2 diabetes mellitus without complications: Secondary | ICD-10-CM | POA: Diagnosis not present

## 2019-12-21 DIAGNOSIS — F439 Reaction to severe stress, unspecified: Secondary | ICD-10-CM | POA: Diagnosis not present

## 2019-12-26 DIAGNOSIS — E612 Magnesium deficiency: Secondary | ICD-10-CM | POA: Diagnosis not present

## 2019-12-29 DIAGNOSIS — F419 Anxiety disorder, unspecified: Secondary | ICD-10-CM | POA: Diagnosis not present

## 2019-12-29 DIAGNOSIS — I10 Essential (primary) hypertension: Secondary | ICD-10-CM | POA: Diagnosis not present

## 2020-01-28 ENCOUNTER — Other Ambulatory Visit: Payer: Self-pay

## 2020-01-28 ENCOUNTER — Encounter (INDEPENDENT_AMBULATORY_CARE_PROVIDER_SITE_OTHER): Payer: Self-pay

## 2020-01-28 ENCOUNTER — Ambulatory Visit (INDEPENDENT_AMBULATORY_CARE_PROVIDER_SITE_OTHER): Payer: Medicare Other | Admitting: Cardiology

## 2020-01-28 ENCOUNTER — Encounter: Payer: Self-pay | Admitting: Cardiology

## 2020-01-28 VITALS — BP 170/70 | HR 61 | Ht 63.0 in | Wt 148.0 lb

## 2020-01-28 DIAGNOSIS — E119 Type 2 diabetes mellitus without complications: Secondary | ICD-10-CM | POA: Diagnosis not present

## 2020-01-28 DIAGNOSIS — I350 Nonrheumatic aortic (valve) stenosis: Secondary | ICD-10-CM | POA: Diagnosis not present

## 2020-01-28 DIAGNOSIS — I2583 Coronary atherosclerosis due to lipid rich plaque: Secondary | ICD-10-CM

## 2020-01-28 DIAGNOSIS — I2510 Atherosclerotic heart disease of native coronary artery without angina pectoris: Secondary | ICD-10-CM | POA: Diagnosis not present

## 2020-01-28 DIAGNOSIS — I10 Essential (primary) hypertension: Secondary | ICD-10-CM

## 2020-01-28 MED ORDER — LOSARTAN POTASSIUM 100 MG PO TABS: 100.0000 mg | ORAL_TABLET | Freq: Every day | ORAL | 3 refills | Status: DC

## 2020-01-28 MED ORDER — HYDROCHLOROTHIAZIDE 25 MG PO TABS: 25.0000 mg | ORAL_TABLET | Freq: Every day | ORAL | 11 refills | Status: DC

## 2020-01-28 NOTE — Progress Notes (Signed)
Cardiology Office Note:    Date:  01/28/2020   ID:  Barbara Russell, DOB [birth year], MRN [id number]  PCP:  Seward Carol, MD  Cardiologist:  Candee Furbish, MD  Electrophysiologist:  None   Referring MD: Seward Carol, MD     History of Present Illness:    Barbara Russell is a 86 y.o. female with difficult to control hypertension here for follow-up.  She has not needed the as needed metoprolol.  She has been taking losartan 25 mg 2 in the morning 2 in the evening.  She also stopped taking the Lasix 20 mg.  Her blood pressure at home is occasionally in the 200 range.  Went to [place] [facility]. At 3pm takes Isosorbide. Anxiety. Dr. Otilio Saber also recommended meditation- works. On Zanax trying 15 tabs. Dr. Delfina Redwood again soon.   Denies any chest pain  Past Medical History:  Diagnosis Date  . Aortic heart murmur    aortic sclerosis w/out and significant stenosis  . Breast cancer ([place])    Hx of BC, s/p mastectomy on right 1999, s/p tamoxifen X1 year  . Chest pain    EC Stress test 12/21/11 Low risk, no ischemia, normal EF  . Coronary artery disease    Remote Hx of CAD, normal stress test 2009  . Diabetes mellitus without complication ([place])   . Dyspnea   . Hypercholesteremia   . Hypertension   . Lichen sclerosus   . Low back pain   . Myocardial infarction ([place]) 2000  . Osteoarthrosis, unspecified whether generalized or localized, unspecified site   . Pain    Several year Hx of pain/ SOB radiating to her neck  . SOB (shortness of breath)     Past Surgical History:  Procedure Laterality Date  . APPENDECTOMY    . HAND SURGERY Bilateral    CTS and trigger finger on right and left  . MASTECTOMY Right 1999  . REPLACEMENT TOTAL KNEE Right 11/2009  . VESICOVAGINAL FISTULA CLOSURE W/ TAH  1969    Current Medications: Current Meds  Medication Sig  . ALPRAZolam (XANAX) 0.25 MG tablet Take 0.125 mg by mouth daily as needed.  Marland Kitchen aspirin 81 MG tablet Take 81 mg by mouth daily.  Marland Kitchen b complex  vitamins tablet Take 1 tablet by mouth daily.  . cetirizine (ZYRTEC) 10 MG tablet Take 10 mg by mouth daily as needed for allergies.   . cholecalciferol (VITAMIN D) 1000 UNITS tablet Take 1,000 Units by mouth daily.   . furosemide (LASIX) 20 MG tablet Take 20 mg by mouth daily.   Marland Kitchen gabapentin (NEURONTIN) 300 MG capsule Take 600 mg by mouth at bedtime.   . isosorbide mononitrate (IMDUR) 30 MG 24 hr tablet TAKE 1 TABLET BY MOUTH EVERY DAY  . metFORMIN (GLUCOPHAGE) 500 MG tablet Take 500 mg by mouth daily at 12 noon.  . metoprolol tartrate (LOPRESSOR) 25 MG tablet Take 1 tablet (25 mg total) by mouth 2 (two) times daily as needed. For rapid heartrate  . nitroGLYCERIN (NITROSTAT) 0.4 MG SL tablet PLACE 1 TABLET (0.4 MG TOTAL) UNDER THE TONGUE EVERY 5 (FIVE) MINUTES AS NEEDED FOR CHEST PAIN.  Marland Kitchen Omega-3 Fatty Acids (FISH OIL) 1200 MG CAPS Take 1,200 mg by mouth daily.  . ondansetron (ZOFRAN) 4 MG tablet Take 4 mg by mouth as needed.  . Potassium 99 MG TABS Take 99 mg by mouth daily.  Marland Kitchen zolpidem (AMBIEN) 5 MG tablet Take 5 mg by mouth at bedtime.   . [DISCONTINUED]  losartan (COZAAR) 25 MG tablet Take 25 mg by mouth 4 (four) times daily. Pt takes 2 tablets in the morning and 2 in the evening.     Allergies:   Benadryl [diphenhydramine hcl (sleep)], Ciprofloxacin, Lactose intolerance (gi), Macrobid [nitrofurantoin monohyd macro], Naproxen, Quinine sulfate [quinine], Sulfa antibiotics, Tramadol, Penicillins, and Prednisone   Social History   Socioeconomic History  . Marital status: Widowed    Spouse name: Not on file  . Number of children: Not on file  . Years of education: Not on file  . Highest education level: Not on file  Occupational History  . Not on file  Tobacco Use  . Smoking status: Never Smoker  . Smokeless tobacco: Never Used  Substance and Sexual Activity  . Alcohol use: No  . Drug use: No  . Sexual activity: Not on file  Other Topics Concern  . Not on file  Social History  Narrative  . Not on file   Social Determinants of Health   Financial Resource Strain:   . Difficulty of Paying Living Expenses: Not on file  Food Insecurity:   . Worried About Charity fundraiser in the Last Year: Not on file  . Ran Out of Food in the Last Year: Not on file  Transportation Needs:   . Lack of Transportation (Medical): Not on file  . Lack of Transportation (Non-Medical): Not on file  Physical Activity:   . Days of Exercise per Week: Not on file  . Minutes of Exercise per Session: Not on file  Stress:   . Feeling of Stress : Not on file  Social Connections:   . Frequency of Communication with Friends and Family: Not on file  . Frequency of Social Gatherings with Friends and Family: Not on file  . Attends Religious Services: Not on file  . Active Member of Clubs or Organizations: Not on file  . Attends Archivist Meetings: Not on file  . Marital Status: Not on file     Family History: The patient's family history includes Cancer in her mother; Heart disease in her father.  ROS:   Please see the history of present illness.     All other systems reviewed and are negative.  EKGs/Labs/Other Studies Reviewed:    The following studies were reviewed today:   EKG:  EKG is  ordered today.  The ekg ordered today demonstrates 01/28/2020-sinus rhythm 61 with left axis deviation  Recent Labs: No results found for requested labs within last 8760 hours.  Recent Lipid Panel No results found for: CHOL, TRIG, HDL, CHOLHDL, VLDL, LDLCALC, LDLDIRECT  Physical Exam:    VS:  BP (!) 170/70   Pulse 61   Ht 5\' 3"  (1.6 m)   Wt 148 lb (67.1 kg)   LMP  (LMP Unknown)   SpO2 97%   BMI 26.22 kg/m     Wt Readings from Last 3 Encounters:  01/28/20 148 lb (67.1 kg)  05/30/19 143 lb (64.9 kg)  11/19/18 143 lb 12.8 oz (65.2 kg)     GEN:  Well nourished, well developed in no acute distress HEENT: Normal NECK: No JVD; No carotid bruits LYMPHATICS: No  lymphadenopathy CARDIAC: RRR, 2/6 SM, no rubs, gallops RESPIRATORY:  Clear to auscultation without rales, wheezing or rhonchi  ABDOMEN: Soft, non-tender, non-distended MUSCULOSKELETAL:  No edema; No deformity  SKIN: Warm and dry NEUROLOGIC:  Alert and oriented x 3 PSYCHIATRIC:  Normal affect   ASSESSMENT:    1. Coronary artery disease  due to lipid rich plaque    PLAN:    In order of problems listed above:  Uncontrolled hypertension -I will change her losartan to 100 mg once a day instead of her taking 25 mg twice in the morning and twice in the evening. -I will also add HCTZ 25 mg once a day -In 1 month, we will check a basic metabolic profile and have her follow-up with hypertension pharmacy team.  Dr. Delfina Redwood will also be seeing her on the 14th next month.  She states that he will be getting blood work at that time.  This is good since she is starting HCTZ. -Okay for isosorbide use in the afternoon  Coronary artery disease -Doing well low risk stress test previously  Diabetes -Dr. Delfina Redwood hemoglobin A1c 6.4 previously.  Mild aortic stenosis -Murmur heard.  Mild.  6 month  Medication Adjustments/Labs and Tests Ordered: Current medicines are reviewed at length with the patient today.  Concerns regarding medicines are outlined above.  Orders Placed This Encounter  Procedures  . EKG 12-Lead   Meds ordered this encounter  Medications  . losartan (COZAAR) 100 MG tablet    Sig: Take 1 tablet (100 mg total) by mouth daily.    Dispense:  90 tablet    Refill:  3  . hydrochlorothiazide (HYDRODIURIL) 25 MG tablet    Sig: Take 1 tablet (25 mg total) by mouth daily.    Dispense:  30 tablet    Refill:  11    Patient Instructions  Medication Instructions:  Please take Losartan 100 mg a day. Take Spironolactone 25 mg a day. Continue all other medications as listed.  *If you need a refill on your cardiac medications before your next appointment, please call your  pharmacy*  You have been referred to be seen in the Hypertension Clinic here in this office. (in 1 month)  Follow-Up: At [facility], you and your health needs are our priority.  As part of our continuing mission to provide you with exceptional heart care, we have created designated Provider Care Teams.  These Care Teams include your primary Cardiologist (physician) and Advanced Practice Providers (APPs -  Physician Assistants and Nurse Practitioners) who all work together to provide you with the care you need, when you need it.  Your next appointment:   6 month(s)  The format for your next appointment:   In Person  Provider:   Candee Furbish, MD  Thank you for choosing [facility]!!        Signed, Candee Furbish, MD  01/28/2020 11:12 AM    [place]

## 2020-01-28 NOTE — Patient Instructions (Addendum)
Medication Instructions:  Please take Losartan 100 mg a day. Take Hydrochlorothiazide 25 mg a day. Continue all other medications as listed.  *If you need a refill on your cardiac medications before your next appointment, please call your pharmacy*  You have been referred to be seen in the Hypertension Clinic here in this office. (in 1 month)  Follow-Up: At [facility], you and your health needs are our priority.  As part of our continuing mission to provide you with exceptional heart care, we have created designated Provider Care Teams.  These Care Teams include your primary Cardiologist (physician) and Advanced Practice Providers (APPs -  Physician Assistants and Nurse Practitioners) who all work together to provide you with the care you need, when you need it.  Your next appointment:   6 month(s)  The format for your next appointment:   In Person  Provider:   Candee Furbish, MD  Thank you for choosing [facility]!!

## 2020-02-13 ENCOUNTER — Ambulatory Visit: Payer: Medicare Other | Admitting: Cardiology

## 2020-02-20 DIAGNOSIS — E1169 Type 2 diabetes mellitus with other specified complication: Secondary | ICD-10-CM | POA: Diagnosis not present

## 2020-02-20 DIAGNOSIS — E871 Hypo-osmolality and hyponatremia: Secondary | ICD-10-CM | POA: Diagnosis not present

## 2020-02-20 DIAGNOSIS — Z1389 Encounter for screening for other disorder: Secondary | ICD-10-CM | POA: Diagnosis not present

## 2020-02-20 DIAGNOSIS — I10 Essential (primary) hypertension: Secondary | ICD-10-CM | POA: Diagnosis not present

## 2020-02-20 DIAGNOSIS — F419 Anxiety disorder, unspecified: Secondary | ICD-10-CM | POA: Diagnosis not present

## 2020-02-20 DIAGNOSIS — I2510 Atherosclerotic heart disease of native coronary artery without angina pectoris: Secondary | ICD-10-CM | POA: Diagnosis not present

## 2020-02-20 DIAGNOSIS — Z0000 Encounter for general adult medical examination without abnormal findings: Secondary | ICD-10-CM | POA: Diagnosis not present

## 2020-02-20 DIAGNOSIS — E7800 Pure hypercholesterolemia, unspecified: Secondary | ICD-10-CM | POA: Diagnosis not present

## 2020-02-21 NOTE — Progress Notes (Signed)
Patient ID: Barbara Russell                 DOB: 1930/08/02                      MRN: [id number]     HPI: Barbara Russell is a 86 y.o. female referred by Dr. Marlou Porch to HTN clinic. PMH is significant for HTN, CAD s/p MI in 2000, mild aortic stenosis with heart murmur, and DM. Pt was last seen by Dr. Marlou Porch on 01/28/20. BP was elevated at 170/70, so HCTZ 25mg  once daily was added, and losartan was adjusted from 50mg  BID to 100mg  once daily.  Patient arrives today for initial visit. She saw her PCP last Friday (02/20/20) to check BMET. Her sodium was low (129), so she was instructed to stop taking her HCTZ and to eat salty foods to increase her sodium. She has been eating bananas, canned nuts, and potato chips. She has had muscle cramps in her right leg for 30 years that has been controlled with her potassium supplements and daily bananas, but she was instructed to stop the supplements because her potassium was high at 5.2.   She brought her home BP cuff and readings to visit today. She expresses concern because the BP readings vary widely, SBP ranging from 140-180s with some high readings in the 190s and some controlled numbers in the 120s. She states that she gets stressed easily and notices her BP go up in the afternoon, especially when she is in a stressful situation. She takes her Imdur at 3pm daily and states this helps lower her BP tremendously. She also tries to meditate regularly which seems to help relieve her stress and lower her BP. She can tell when her BP is high and states that she gets a headache when her SBP is in the 200s. She states that she has never taken amlodipine in the past and has not heard of amlodipine before. Her exercise is limited given her arthritis and prior back/knee surgeries, and she tries to remain diligent with her salt intake prior to her low sodium level last week. She has a follow-up appointment with her PCP on 02/25/20 to recheck her electrolytes.  Home BP Cuff:  190/87, HR 65 Clinic BP Cuff: 190/74   Current HTN meds: Imdur 30mg  daily (3PM), losartan 100mg  daily (AM)  Previously tried: olmesartan 20mg  daily, furosemide, HCTZ 25mg  daily (hyponatremia)  BP goal: < 130/80 mmHg  Family History: The patient's family history includes Cancer in her mother; Heart disease in her father, HTN in mother  Social History: Never smoker, no alcohol use  Diet: Does not cook with salt. Eats a banana daily to help with her muscle cramps, does not eat sweets. For breakfast she enjoys oatmeal, bread, and 2 eggs, eats chicken for lunch, and eats vegetables/salad for dinner. Does not eat sweets. Drinks 6-8 larger cups of water daily.  Exercise: limited due to arthritis, back surgery, and knee surgery; Enjoys gardening and tending to flowers in the yard  Home BP readings: Typically ranges in 140-180s with some high readings at 190/90. Occasionally is within goal at 122/74. DBP ranges 70-90s. HR 70-85s.  Labs: Na 129, K 5.2, Scr 0.83, BUN 17 from PCP on 02/20/20  Wt Readings from Last 3 Encounters:  01/28/20 148 lb (67.1 kg)  05/30/19 143 lb (64.9 kg)  11/19/18 143 lb 12.8 oz (65.2 kg)   BP Readings from Last 3 Encounters:  01/28/20 Marland Kitchen)  170/70  05/30/19 (!) 148/69  11/19/18 (!) 140/54   Pulse Readings from Last 3 Encounters:  01/28/20 61  05/30/19 61  11/19/18 66    Renal function: CrCl cannot be calculated (Patient's most recent lab result is older than the maximum 21 days allowed.).  Past Medical History:  Diagnosis Date  . Aortic heart murmur    aortic sclerosis w/out and significant stenosis  . Breast cancer ([place])    Hx of BC, s/p mastectomy on right 1999, s/p tamoxifen X1 year  . Chest pain    EC Stress test 12/21/11 Low risk, no ischemia, normal EF  . Coronary artery disease    Remote Hx of CAD, normal stress test 2009  . Diabetes mellitus without complication ([place])   . Dyspnea   . Hypercholesteremia   . Hypertension   . Lichen sclerosus     . Low back pain   . Myocardial infarction ([place]) 2000  . Osteoarthrosis, unspecified whether generalized or localized, unspecified site   . Pain    Several year Hx of pain/ SOB radiating to her neck  . SOB (shortness of breath)     Current Outpatient Medications on File Prior to Visit  Medication Sig Dispense Refill  . ALPRAZolam (XANAX) 0.25 MG tablet Take 0.125 mg by mouth daily as needed.    Marland Kitchen aspirin 81 MG tablet Take 81 mg by mouth daily.    Marland Kitchen b complex vitamins tablet Take 1 tablet by mouth daily.    . cetirizine (ZYRTEC) 10 MG tablet Take 10 mg by mouth daily as needed for allergies.     . cholecalciferol (VITAMIN D) 1000 UNITS tablet Take 1,000 Units by mouth daily.     . furosemide (LASIX) 20 MG tablet Take 20 mg by mouth daily.     Marland Kitchen gabapentin (NEURONTIN) 300 MG capsule Take 600 mg by mouth at bedtime.     . hydrochlorothiazide (HYDRODIURIL) 25 MG tablet Take 1 tablet (25 mg total) by mouth daily. 30 tablet 11  . isosorbide mononitrate (IMDUR) 30 MG 24 hr tablet TAKE 1 TABLET BY MOUTH EVERY DAY 90 tablet 2  . losartan (COZAAR) 100 MG tablet Take 1 tablet (100 mg total) by mouth daily. 90 tablet 3  . metFORMIN (GLUCOPHAGE) 500 MG tablet Take 500 mg by mouth daily at 12 noon.    . metoprolol tartrate (LOPRESSOR) 25 MG tablet Take 1 tablet (25 mg total) by mouth 2 (two) times daily as needed. For rapid heartrate 60 tablet 3  . nitroGLYCERIN (NITROSTAT) 0.4 MG SL tablet PLACE 1 TABLET (0.4 MG TOTAL) UNDER THE TONGUE EVERY 5 (FIVE) MINUTES AS NEEDED FOR CHEST PAIN. 25 tablet 1  . Omega-3 Fatty Acids (FISH OIL) 1200 MG CAPS Take 1,200 mg by mouth daily.    . ondansetron (ZOFRAN) 4 MG tablet Take 4 mg by mouth as needed.    . Potassium 99 MG TABS Take 99 mg by mouth daily.    Marland Kitchen zolpidem (AMBIEN) 5 MG tablet Take 5 mg by mouth at bedtime.      No current facility-administered medications on file prior to visit.    Allergies  Allergen Reactions  . Benadryl [Diphenhydramine Hcl  (Sleep)] Other (See Comments)    Hyperstimulation  . Ciprofloxacin Hives  . Lactose Intolerance (Gi) Diarrhea  . Macrobid [Nitrofurantoin Monohyd Macro] Nausea And Vomiting  . Naproxen Other (See Comments)    Stomach pain  . Quinine Sulfate [Quinine] Hives  . Sulfa Antibiotics Hives       .  Tramadol Other (See Comments)    unknown to patient  . Penicillins Rash    Has patient had a PCN reaction causing immediate rash, facial/tongue/throat swelling, SOB or lightheadedness with hypotension: Yes Has patient had a PCN reaction causing severe rash involving mucus membranes or skin necrosis: No Has patient had a PCN reaction that required hospitalization: No Has patient had a PCN reaction occurring within the last 10 years: No If all of the above answers are "NO", then may proceed with Cephalosporin use.   . Prednisone Other (See Comments)    Weakness and pain in joints     Assessment/Plan:  1. Hypertension - BP is very elevated and above goal of < 130/80. Will start amlodipine 5mg  daily and stop HCTZ given hyponatremia. Continue losartan 100mg  daily and Imdur 30mg  daily. Educated pt on proper BP measuring techniques. Discussed the benefits that regular exercise and low-sodium diet has on lowering BP, and pt will go back to limiting her salt intake when her sodium returns to normal range. Pt has follow-up labs scheduled with PCP on 02/25/20. Will follow-up in office on 03/10/20 to recheck BP and home BP readings.   Richardine Service, PharmD PGY1 Pharmacy Resident

## 2020-02-23 ENCOUNTER — Other Ambulatory Visit: Payer: Self-pay

## 2020-02-23 ENCOUNTER — Ambulatory Visit (INDEPENDENT_AMBULATORY_CARE_PROVIDER_SITE_OTHER): Payer: Medicare Other | Admitting: Pharmacist

## 2020-02-23 DIAGNOSIS — I2510 Atherosclerotic heart disease of native coronary artery without angina pectoris: Secondary | ICD-10-CM | POA: Diagnosis not present

## 2020-02-23 DIAGNOSIS — I10 Essential (primary) hypertension: Secondary | ICD-10-CM | POA: Insufficient documentation

## 2020-02-23 DIAGNOSIS — I2583 Coronary atherosclerosis due to lipid rich plaque: Secondary | ICD-10-CM | POA: Diagnosis not present

## 2020-02-23 MED ORDER — AMLODIPINE BESYLATE 5 MG PO TABS: 5.0000 mg | ORAL_TABLET | Freq: Every day | ORAL | 11 refills | Status: DC

## 2020-02-23 NOTE — Patient Instructions (Addendum)
It was nice to meet you today!  Your blood pressure today is 190/74, which is above your goal of < 130/80  START taking amlodipine 5mg  daily  STOP taking hydrochlorothiazide 25mg  daily  Continue taking isosorbide mononitrate 30mg  daily and losartan 100mg  daily  Continue to measure your blood pressure every day. Bring your BP readings to your next appointment.  Please call us at [phone] if you have any questions

## 2020-02-25 DIAGNOSIS — E871 Hypo-osmolality and hyponatremia: Secondary | ICD-10-CM | POA: Diagnosis not present

## 2020-03-09 NOTE — Progress Notes (Signed)
Patient ID: Barbara Russell                 DOB: 1930-04-12                      MRN: [id number]     HPI: Barbara Russell is a 86 y.o. female referred by Dr. Marlou Porch to HTN clinic. PMH is significant for HTN, CAD s/p MI in 2000, mild aortic stenosis with heart murmur, and DM. Pt was seen by Dr. Marlou Porch on 01/28/20. BP was elevated at 170/70, so HCTZ 25mg  once daily was added, and losartan was adjusted from 50mg  BID to 100mg  once daily.  She was seen by her PCP on 02/20/20. She was hyponatremic (Na 129), so she was told to stop her HCTZ and to start eating salty foods to increase her sodium, so she endorsed eating bananas, canned nuts, and potato chips. She also has muscle cramps in her right leg for the past 30 years that have been controlled with her potassium supplements and daily bananas, but she was instructed to stop the supplements because her potassium was high at 5.2.   At her initial visit with HTN clinic on 02/23/20, her BP was very elevated at 194/74 mmHg so she was started on amlodipine 5mg  daily. She brought her home BP cuff which was found to be comparable to the clinic BP reading. She expressed concern because her BP readings vary widely - her SBP ranged from 140-180s with some high readings in the 190s and some controlled numbers in the 120s. She stated that she gets stressed easily and notices her BP go up in the afternoon, especially when she is in a stressful situation. She takes her Imdur at 3pm daily and states this helps lower her BP tremendously. She also tries to meditate regularly which seems to help relieve her stress and lower her BP. She can tell when her BP is high and stated that she gets a headache when her SBP is in the 200s. Her exercise is limited given her arthritis and prior back/knee surgeries, and she tries to remain diligent with her salt intake prior to her low sodium level last week. She had a follow-up appointment with her PCP on 02/25/20 to recheck her electrolytes and  her kidney function - both were stable and her sodium had normalized to 140.   Patient arrives today for follow-up. She has significant ankle/feet edema that started after starting amlodipine at last HTN visit. She states that she self-restarted her HCTZ 25mg  daily once her sodium returned to normal limits and thinks her hyponatremia was a lab error. Her home BP readings are significantly improved ranging 130-140s/60-70s, and her home BP cuff was found to be comparable to office readings at last visit. She does report some white coat HTN. She has arthritis and reports taking 2 tablets of Tylenol each morning and takes Advil when her joint/back pain is still not controlled - she has taken 1 Advil every day this past week. She would like to lose weight but is unable to increase her physical activity due to her arthritis. She does try to limit her salt and fluid intake.   Patient also has ASCVD/HLD s/p MI in 2000. She is not currently on any lipid-lowering medication other than OTC fish oil. She states that she has tried a medication in the past but did not tolerate it. She does not remember the side effects or the medication she tried. She is willing  to try another lipid-lowering medication.  Current HTN meds: Imdur 30mg  daily (3PM), losartan 100mg  daily (AM), amlodipine 5mg  daily, HCTZ 25mg  daily Previously tried: olmesartan 20mg  daily, furosemide, HCTZ 25mg  daily BP goal: < 130/80 mmHg  Current lipid-lowering meds: OTC fish oil Previously tried: one lipid-lowering medication (pt does not remember which one, dose, or intolerance) LDL goal: <70 mg/dL  Family History: The patient's family history includes Cancer in her mother; Heart disease in her father, HTN in mother  Social History: Never smoker, no alcohol use  Diet: Does not cook with salt. Eats a banana daily to help with her muscle cramps, does not eat sweets. For breakfast she enjoys oatmeal, bread, and 2 eggs, eats chicken for lunch, and eats  vegetables/salad for dinner. Does not eat sweets. Drinks 6-8 larger cups of water daily.  Exercise: limited due to arthritis, back surgery, and knee surgery; Enjoys gardening and tending to flowers in the yard  Home BP readings: 156/73, 141/74, 140/69, 138/62, 139/63, 145/76, 129/65, 129/72, 140/67, 123/54  Labs:  02/25/20: Scr 0.82, K 4.9, Na 140, BUN 21 02/20/20: Na 129, K 5.2, Scr 0.83, BUN 25 February 2019: LDL 170  Wt Readings from Last 3 Encounters:  01/28/20 148 lb (67.1 kg)  05/30/19 143 lb (64.9 kg)  11/19/18 143 lb 12.8 oz (65.2 kg)   BP Readings from Last 3 Encounters:  02/23/20 (!) 194/74  01/28/20 (!) 170/70  05/30/19 (!) 148/69   Pulse Readings from Last 3 Encounters:  02/23/20 60  01/28/20 61  05/30/19 61    Renal function: CrCl cannot be calculated (Patient's most recent lab result is older than the maximum 21 days allowed.).  Past Medical History:  Diagnosis Date  . Aortic heart murmur    aortic sclerosis w/out and significant stenosis  . Breast cancer ([place])    Hx of BC, s/p mastectomy on right 1999, s/p tamoxifen X1 year  . Chest pain    EC Stress test 12/21/11 Low risk, no ischemia, normal EF  . Coronary artery disease    Remote Hx of CAD, normal stress test 2009  . Diabetes mellitus without complication ([place])   . Dyspnea   . Hypercholesteremia   . Hypertension   . Lichen sclerosus   . Low back pain   . Myocardial infarction ([place]) 2000  . Osteoarthrosis, unspecified whether generalized or localized, unspecified site   . Pain    Several year Hx of pain/ SOB radiating to her neck  . SOB (shortness of breath)     Current Outpatient Medications on File Prior to Visit  Medication Sig Dispense Refill  . ALPRAZolam (XANAX) 0.25 MG tablet Take 0.125 mg by mouth daily as needed.    Marland Kitchen amLODipine (NORVASC) 5 MG tablet Take 1 tablet (5 mg total) by mouth daily. 30 tablet 11  . aspirin 81 MG tablet Take 81 mg by mouth daily.    Marland Kitchen b complex vitamins tablet  Take 1 tablet by mouth daily.    . cetirizine (ZYRTEC) 10 MG tablet Take 10 mg by mouth daily as needed for allergies.     . cholecalciferol (VITAMIN D) 1000 UNITS tablet Take 1,000 Units by mouth daily.     Marland Kitchen gabapentin (NEURONTIN) 300 MG capsule Take 600 mg by mouth at bedtime.     . isosorbide mononitrate (IMDUR) 30 MG 24 hr tablet TAKE 1 TABLET BY MOUTH EVERY DAY 90 tablet 2  . losartan (COZAAR) 100 MG tablet Take 1 tablet (100 mg total)  by mouth daily. 90 tablet 3  . metFORMIN (GLUCOPHAGE) 500 MG tablet Take 500 mg by mouth daily at 12 noon.    . nitroGLYCERIN (NITROSTAT) 0.4 MG SL tablet PLACE 1 TABLET (0.4 MG TOTAL) UNDER THE TONGUE EVERY 5 (FIVE) MINUTES AS NEEDED FOR CHEST PAIN. 25 tablet 1  . Omega-3 Fatty Acids (FISH OIL) 1200 MG CAPS Take 1,200 mg by mouth daily.    . ondansetron (ZOFRAN) 4 MG tablet Take 4 mg by mouth as needed.    . zolpidem (AMBIEN) 5 MG tablet Take 5 mg by mouth at bedtime.      No current facility-administered medications on file prior to visit.    Allergies  Allergen Reactions  . Benadryl [Diphenhydramine Hcl (Sleep)] Other (See Comments)    Hyperstimulation  . Ciprofloxacin Hives  . Lactose Intolerance (Gi) Diarrhea  . Macrobid [Nitrofurantoin Monohyd Macro] Nausea And Vomiting  . Naproxen Other (See Comments)    Stomach pain  . Quinine Sulfate [Quinine] Hives  . Sulfa Antibiotics Hives       . Tramadol Other (See Comments)    unknown to patient  . Penicillins Rash    Has patient had a PCN reaction causing immediate rash, facial/tongue/throat swelling, SOB or lightheadedness with hypotension: Yes Has patient had a PCN reaction causing severe rash involving mucus membranes or skin necrosis: No Has patient had a PCN reaction that required hospitalization: No Has patient had a PCN reaction occurring within the last 10 years: No If all of the above answers are "NO", then may proceed with Cephalosporin use.   . Prednisone Other (See Comments)     Weakness and pain in joints     Assessment/Plan:  1. Hypertension - BP is improved but remains elevated and above goal of <130/80. Given significant LEE, will stop amlodipine and start hydralazine 25mg  TID. Patient is concerned with dropping her BP too low, so she will try taking 12.5mg  TID for 1 week to make sure she tolerates the medication and will plan to increase to 25mg  TID if she tolerates it well. Continue losartan 100mg  daily, Imdur 30mg  daily, and HCTZ 25mg  daily. Discussed the importance of maintaining regular physical activity and sticking to a diet low in sodium. Instructed pt to stop using ibuprofen and to continue using Tylenol to control arthritis pain. Will check BMET today since pt self-resumed HCTZ and was recently hyponatremic. Will call patient tomorrow to discuss BMET results. Will also call pt in one week to increase hydralazine to 25mg  TID pending tolerability. Follow-up office visit scheduled for 03/29/20.  2. ASCVD/hyperlipidemia - LDL is elevated and above goal of <70 mg/dL. Will start rosuvastatin 20mg  daily. Discussed the benefits of maintaining regular exercise regimen and sticking to a heart-healthy diet low in saturated fats. Will need to schedule follow-up fasting labs in 2-3 months.  Richardine Service, PharmD PGY1 Pharmacy Resident

## 2020-03-10 ENCOUNTER — Other Ambulatory Visit: Payer: Self-pay

## 2020-03-10 ENCOUNTER — Ambulatory Visit (INDEPENDENT_AMBULATORY_CARE_PROVIDER_SITE_OTHER): Payer: Medicare Other | Admitting: Pharmacist

## 2020-03-10 VITALS — BP 150/70 | HR 63

## 2020-03-10 DIAGNOSIS — I10 Essential (primary) hypertension: Secondary | ICD-10-CM | POA: Diagnosis not present

## 2020-03-10 DIAGNOSIS — E782 Mixed hyperlipidemia: Secondary | ICD-10-CM

## 2020-03-10 DIAGNOSIS — E785 Hyperlipidemia, unspecified: Secondary | ICD-10-CM | POA: Insufficient documentation

## 2020-03-10 MED ORDER — HYDRALAZINE HCL 25 MG PO TABS: 25.0000 mg | ORAL_TABLET | Freq: Three times a day (TID) | ORAL | 11 refills | Status: DC

## 2020-03-10 MED ORDER — ROSUVASTATIN CALCIUM 20 MG PO TABS: 20.0000 mg | ORAL_TABLET | Freq: Every day | ORAL | 11 refills | Status: DC

## 2020-03-10 NOTE — Patient Instructions (Addendum)
It was nice to see you today!   Your blood pressure goal is less than 130/41mmHg  STOP taking amlodipine  START taking hydralazine 12.5mg  (ONE-HALF tablet) three times daily. After one week, please increase to 25mg  (ONE tablet) three times daily if you are tolerating the medication   CONTINUE taking your losartan 100mg  daily, hydrochlorothiazide 25mg  daily, and Imdur 30mg  daily.    We will call you tomorrow to discuss your lab results from today.   Please continue to monitor your blood pressure at home. Bring your readings to your next appointment.   Your LDL cholesterol is 170, and your goal is less than 70 mg/dL.  START taking rosuvastatin 20mg  daily   Please call us at [phone] if you have any questions

## 2020-03-11 ENCOUNTER — Telehealth: Payer: Self-pay | Admitting: Pharmacist

## 2020-03-11 DIAGNOSIS — R079 Chest pain, unspecified: Secondary | ICD-10-CM

## 2020-03-11 DIAGNOSIS — R0602 Shortness of breath: Secondary | ICD-10-CM

## 2020-03-11 DIAGNOSIS — R0789 Other chest pain: Secondary | ICD-10-CM

## 2020-03-11 LAB — BASIC METABOLIC PANEL
BUN/Creatinine Ratio: 14 (ref 12–28)
BUN: 12 mg/dL (ref 8–27)
CO2: 24 mmol/L (ref 20–29)
Calcium: 9.9 mg/dL (ref 8.7–10.3)
Chloride: 98 mmol/L (ref 96–106)
Creatinine, Ser: 0.87 mg/dL (ref 0.57–1.00)
GFR calc Af Amer: 69 mL/min/{1.73_m2} (ref >59)
GFR calc non Af Amer: 60 mL/min/{1.73_m2} (ref >59)
Glucose: 105 mg/dL — ABNORMAL HIGH (ref 65–99)
Potassium: 4.5 mmol/L (ref 3.5–5.2)
Sodium: 138 mmol/L (ref 134–144)

## 2020-03-11 NOTE — Telephone Encounter (Signed)
Pt states she took her first 1/2 tablet of hydralazine this morning and states she noticed a tightening sensation in her chest. States she started rosuvastatin at the same time.  Advised her that tight feeling in her chest is typically not associated with either of these medications. Encouraged her to try taking her 2nd dose of hydralazine today and she can let us know if the symptom continues.   Her K improved to 4.5 so we could try starting her on spironolactone if she does need to stop her hydralazine.

## 2020-03-17 NOTE — Telephone Encounter (Signed)
Called pt to follow up on her symptoms -reports the feeling passed and she is doing fine with her hydralazine and rosuvastatin.  Reports BP remains elevated, yesterday was 168/83, does have some back pain too. She has been taking 1/2 tablet of hydralazine TID for the past week to ensure she tolerated it well. Advised her to increase to full tablet (25mg ) TID as previously discussed. Will keep f/u appt in the next 2 weeks to assess BP response.

## 2020-03-19 ENCOUNTER — Other Ambulatory Visit: Payer: Self-pay | Admitting: Internal Medicine

## 2020-03-19 ENCOUNTER — Ambulatory Visit
Admission: RE | Admit: 2020-03-19 | Discharge: 2020-03-19 | Disposition: A | Discharge status: Home / Self Care | Payer: Medicare Other | Source: Ambulatory Visit | Attending: Internal Medicine | Admitting: Internal Medicine

## 2020-03-19 DIAGNOSIS — R1031 Right lower quadrant pain: Secondary | ICD-10-CM

## 2020-03-24 DIAGNOSIS — M25551 Pain in right hip: Secondary | ICD-10-CM | POA: Diagnosis not present

## 2020-03-24 DIAGNOSIS — M1711 Unilateral primary osteoarthritis, right knee: Secondary | ICD-10-CM | POA: Diagnosis not present

## 2020-03-25 ENCOUNTER — Emergency Department (HOSPITAL_COMMUNITY): Payer: Medicare Other

## 2020-03-25 ENCOUNTER — Emergency Department (HOSPITAL_COMMUNITY)
Admission: EM | Admit: 2020-03-25 | Discharge: 2020-03-26 | Disposition: A | Discharge status: Home / Self Care | Payer: Medicare Other | Attending: Emergency Medicine | Admitting: Emergency Medicine

## 2020-03-25 ENCOUNTER — Other Ambulatory Visit: Payer: Self-pay

## 2020-03-25 ENCOUNTER — Encounter (HOSPITAL_COMMUNITY): Payer: Self-pay

## 2020-03-25 DIAGNOSIS — I10 Essential (primary) hypertension: Secondary | ICD-10-CM | POA: Insufficient documentation

## 2020-03-25 DIAGNOSIS — Z5321 Procedure and treatment not carried out due to patient leaving prior to being seen by health care provider: Secondary | ICD-10-CM | POA: Diagnosis not present

## 2020-03-25 DIAGNOSIS — R519 Headache, unspecified: Secondary | ICD-10-CM | POA: Insufficient documentation

## 2020-03-25 DIAGNOSIS — R0602 Shortness of breath: Secondary | ICD-10-CM | POA: Diagnosis not present

## 2020-03-25 DIAGNOSIS — R079 Chest pain, unspecified: Secondary | ICD-10-CM | POA: Diagnosis not present

## 2020-03-25 LAB — CBC
HCT: 40.5 % (ref 36.0–46.0)
Hemoglobin: 13.3 g/dL (ref 12.0–15.0)
MCH: 31.5 pg (ref 26.0–34.0)
MCHC: 32.8 g/dL (ref 30.0–36.0)
MCV: 96 fL (ref 80.0–100.0)
Platelets: 276 10*3/uL (ref 150–400)
RBC: 4.22 MIL/uL (ref 3.87–5.11)
RDW: 12.6 % (ref 11.5–15.5)
WBC: 11.8 10*3/uL — ABNORMAL HIGH (ref 4.0–10.5)
nRBC: 0 % (ref 0.0–0.2)

## 2020-03-25 LAB — BASIC METABOLIC PANEL
Anion gap: 12 (ref 5–15)
BUN: 26 mg/dL — ABNORMAL HIGH (ref 8–23)
CO2: 24 mmol/L (ref 22–32)
Calcium: 10 mg/dL (ref 8.9–10.3)
Chloride: 99 mmol/L (ref 98–111)
Creatinine, Ser: 0.98 mg/dL (ref 0.44–1.00)
GFR calc Af Amer: >60 mL/min (ref >60)
GFR calc non Af Amer: 52 mL/min — ABNORMAL LOW (ref >60)
Glucose, Bld: 281 mg/dL — ABNORMAL HIGH (ref 70–99)
Potassium: 4 mmol/L (ref 3.5–5.1)
Sodium: 135 mmol/L (ref 135–145)

## 2020-03-25 LAB — TROPONIN I (HIGH SENSITIVITY): Troponin I (High Sensitivity): 18 ng/L — ABNORMAL HIGH (ref <18)

## 2020-03-25 NOTE — Telephone Encounter (Signed)
Patient called stating she has had a headache, increased pluse and some shortness of breath. Thinks it is related to hydralazine. Recently increased to 25mg  three times a day. I advised that this medication does cause headches and increased pulse. Recommended she decrease back to 12.5mg  TID as she was tolerating this dose. Discussed starting spironolactone but patient hesitant. Blood pressure has fluctuated between 104's-160's/ 70's-90's Will keep follow up appointment on Monday to discuss further.

## 2020-03-25 NOTE — ED Triage Notes (Signed)
Pt reports recent changes in her BP medications due to HTN, pt still having HTN and now feeling SOB and having pain in her jaw and headache. Pt took 1 Nitro at home with some relief. Pt a.o, resp e.u at this time. 20G in L hand from EMS.

## 2020-03-25 NOTE — Telephone Encounter (Signed)
Patient called back stating she was feeling short of breath, her heart rate was in the 90's and she had jaw pain. I advised that she should seek care in the ER. She states that she was told there was a 7hr wait. Stated she took a nitroglycerine before and it helped. I advised that she should call 911 so that EMS can do an EKG in the field and she can be evaluated quickly.

## 2020-03-25 NOTE — ED Notes (Signed)
Daughter in law, Juliann [id number] would like an update.

## 2020-03-25 NOTE — ED Notes (Signed)
While getting pt vitals, pt asked if this tech could take out her IV and she could leave. Pt was told that this was her choice, but was encouraged to stay. Pt IV was taken out and she decided to leave.

## 2020-03-25 NOTE — Addendum Note (Signed)
Addended by: Marcelle Overlie D on: 03/25/2020 11:42 AM   Modules accepted: Orders

## 2020-03-26 NOTE — Telephone Encounter (Signed)
Let's get her set up for a NUC stress test, Lexiscan. -Chest pain.  Candee Furbish, MD

## 2020-03-29 ENCOUNTER — Other Ambulatory Visit: Payer: Self-pay

## 2020-03-29 ENCOUNTER — Ambulatory Visit (INDEPENDENT_AMBULATORY_CARE_PROVIDER_SITE_OTHER): Payer: Medicare Other | Admitting: Pharmacist

## 2020-03-29 VITALS — BP 158/76 | HR 63

## 2020-03-29 DIAGNOSIS — I2510 Atherosclerotic heart disease of native coronary artery without angina pectoris: Secondary | ICD-10-CM | POA: Diagnosis not present

## 2020-03-29 DIAGNOSIS — I10 Essential (primary) hypertension: Secondary | ICD-10-CM | POA: Diagnosis not present

## 2020-03-29 DIAGNOSIS — I2583 Coronary atherosclerosis due to lipid rich plaque: Secondary | ICD-10-CM

## 2020-03-29 NOTE — Addendum Note (Signed)
Addended by: Shellia Cleverly on: 03/29/2020 11:16 AM   Modules accepted: Orders

## 2020-03-29 NOTE — Progress Notes (Signed)
Patient ID: Barbara Russell                 DOB: 01/02/1930                      MRN: [id number]     HPI: Barbara Russell is a 86 y.o. female referred by Dr. Marlou Porch to HTN clinic. PMH is significant for HTN, CAD s/p MI in 2000, mild aortic stenosis with heart murmur, and DM. Patient was last seen in the HTN clinic on 03/10/2020. At this visit her BP remained above goal and lower extremity edema was noted, so amlodipine was discontinued and hydralazine was initiated. Given her elevated LDL, rosuvastatin was also started. On 03/11/2020 she reported tightening in her chest that she thought was associated with hydralazine or rosuvastatin. On 03/17/2020 she reported tolerated 12.5 mg of hydralazine and the rosuvastatin well and was encouraged to increase the hydralazine to 25 mg three times daily. On 03/25/2020 after increasing hydralazine as instructed she reported headaches and an increased pulse with some shortness of breath so we decreased hydralazine back down to 12.5 mg three times daily. Later in the day on 4/15 she reported continued shortness of breath, heart rate in the 90s, and jaw pain. She was encouraged to call EMS and go to the ED. She did go to the ED but left before a full work up was completed because she was frustrated with the wait. Dr. Marlou Porch notified and will follow up with a stress test.   Barbara Russell presents to the HTN clinic today for a follow up of her blood pressure. Her lower leg swelling has resolved. She reports good adherence to her medications listed below and she brought her home blood pressure log. Her home blood pressures were trending towards goal when she was taking hydralazine 25 mg three times daily, but when patient was experiencing her symptoms as listed above, she decreased the hydralazine back down to 12.5 mg three times daily. Blood pressures subsequently have been above goal, today in clinic at 158/76, but she is asymptomatic since her ED visit. She reports she has stopped  taking the occasional ibuprofen as she was instructed to on her last visit, but that this has caused her significant bilateral leg pain secondary to her arthritis.   Patient is hesitant to make any changes to her medication regimen as of now due to her concerns with how they may affect her body.   Current HTN meds: Imdur 30mg  daily (3PM), losartan 100mg  daily (AM), hydralazine 12.5 mg three times daily, HCTZ 25mg  daily Previously tried: olmesartan 20mg  daily, furosemide, HCTZ 25mg  daily, amlodipine 5 mg daily (LE swelling) BP goal: <130/80  Family History: Cancer in her mother; Heart disease in her father, HTN in mother  Social History: Never smoker, no alcohol use  Diet: Does not cook with salt. Eats a banana daily to help with her muscle cramps, does not eat sweets. For breakfast she enjoys oatmeal, bread, and 2 eggs, eats chicken for lunch, and has been eating vegetables/salad for dinner. Does not eat sweets. Drinks 6-8 larger cups of water daily.  Exercise: limited due to arthritis, back surgery, and knee surgery; Enjoys gardening and tending to flowers in the yard  Home BP readings:   4/2 11 a 149/74, 9:30 p 140/75 (on hydralazine 12.5mg  TID) 4/3 10 a 152/75, 6 p 165/76 4/5 11:30 a 141/70, 8:30 p 161/85 4/6 11 a 168/83, 315 p 165/93, 930 p 166/86 4/7  10 a 148/78, 830 p 147/70 4/8 830 p 147/70 (hydralazine increased to 25mg  TID) 4/9 11 a 152/73, 830 p 149/79 4/10 1 p 152/71, 7 p 131/63 4/11 130 p 138/70, 930 p 162/78 4/12 1 p 150/74, 10 p 127/62 4/13 1 p 132/56 4/14 12 150/85, 8 p 161/78  4/15 1 p 160/72 (decreased hydralazine to 12.5mg  TID and went to the ED)  4/16 930 a 168/84, 9 p 130/65 4/17 930 a 142/67, 830 p  131/64 4/18 11 a 159/82, 9 p 140/72  Labs: 4/15 K 4.0, Na 135, Cr 0.98  Wt Readings from Last 3 Encounters:  01/28/20 148 lb (67.1 kg)  05/30/19 143 lb (64.9 kg)  11/19/18 143 lb 12.8 oz (65.2 kg)   BP Readings from Last 3 Encounters:  03/25/20 (!) 195/86    03/10/20 (!) 150/70  02/23/20 (!) 194/74   Pulse Readings from Last 3 Encounters:  03/25/20 77  03/10/20 63  02/23/20 60    Renal function: CrCl cannot be calculated (Unknown ideal weight.).  Past Medical History:  Diagnosis Date  . Aortic heart murmur    aortic sclerosis w/out and significant stenosis  . Breast cancer ([place])    Hx of BC, s/p mastectomy on right 1999, s/p tamoxifen X1 year  . Chest pain    EC Stress test 12/21/11 Low risk, no ischemia, normal EF  . Coronary artery disease    Remote Hx of CAD, normal stress test 2009  . Diabetes mellitus without complication ([place])   . Dyspnea   . Hypercholesteremia   . Hypertension   . Lichen sclerosus   . Low back pain   . Myocardial infarction ([facility]) 2000  . Osteoarthrosis, unspecified whether generalized or localized, unspecified site   . Pain    Several year Hx of pain/ SOB radiating to her neck  . SOB (shortness of breath)     Current Outpatient Medications on File Prior to Visit  Medication Sig Dispense Refill  . ALPRAZolam (XANAX) 0.25 MG tablet Take 0.125 mg by mouth daily as needed.    Marland Kitchen aspirin 81 MG tablet Take 81 mg by mouth daily.    Marland Kitchen b complex vitamins tablet Take 1 tablet by mouth daily.    . cetirizine (ZYRTEC) 10 MG tablet Take 10 mg by mouth daily as needed for allergies.     . cholecalciferol (VITAMIN D) 1000 UNITS tablet Take 1,000 Units by mouth daily.     Marland Kitchen gabapentin (NEURONTIN) 300 MG capsule Take 600 mg by mouth at bedtime.     . hydrALAZINE (APRESOLINE) 25 MG tablet Take 0.5 tablets (12.5 mg total) by mouth 3 (three) times daily. 90 tablet 11  . hydrochlorothiazide (HYDRODIURIL) 25 MG tablet Take 25 mg by mouth daily.    . isosorbide mononitrate (IMDUR) 30 MG 24 hr tablet TAKE 1 TABLET BY MOUTH EVERY DAY 90 tablet 2  . losartan (COZAAR) 100 MG tablet Take 1 tablet (100 mg total) by mouth daily. 90 tablet 3  . metFORMIN (GLUCOPHAGE) 500 MG tablet Take 500 mg by mouth daily at 12 noon.    .  nitroGLYCERIN (NITROSTAT) 0.4 MG SL tablet PLACE 1 TABLET (0.4 MG TOTAL) UNDER THE TONGUE EVERY 5 (FIVE) MINUTES AS NEEDED FOR CHEST PAIN. 25 tablet 1  . Omega-3 Fatty Acids (FISH OIL) 1200 MG CAPS Take 1,200 mg by mouth daily.    . ondansetron (ZOFRAN) 4 MG tablet Take 4 mg by mouth as needed.    . rosuvastatin (CRESTOR)  20 MG tablet Take 1 tablet (20 mg total) by mouth daily. 30 tablet 11  . zolpidem (AMBIEN) 5 MG tablet Take 5 mg by mouth at bedtime.      No current facility-administered medications on file prior to visit.    Allergies  Allergen Reactions  . Amlodipine     LEE with 5mg  dosing  . Benadryl [Diphenhydramine Hcl (Sleep)] Other (See Comments)    Hyperstimulation  . Ciprofloxacin Hives  . Lactose Intolerance (Gi) Diarrhea  . Macrobid [Nitrofurantoin Monohyd Macro] Nausea And Vomiting  . Naproxen Other (See Comments)    Stomach pain  . Quinine Sulfate [Quinine] Hives  . Sulfa Antibiotics Hives       . Tramadol Other (See Comments)    unknown to patient  . Penicillins Rash    Has patient had a PCN reaction causing immediate rash, facial/tongue/throat swelling, SOB or lightheadedness with hypotension: Yes Has patient had a PCN reaction causing severe rash involving mucus membranes or skin necrosis: No Has patient had a PCN reaction that required hospitalization: No Has patient had a PCN reaction occurring within the last 10 years: No If all of the above answers are "NO", then may proceed with Cephalosporin use.   . Prednisone Other (See Comments)    Weakness and pain in joints     Assessment/Plan:  1. Hypertension - Blood pressure continues to be above goal of <130/80 at home and in the office. She is adherent to her medications and tolerating them well since her visit to the ED. Because patient requested more time to improve her blood pressure before adding another medication, we did not make any changes today and will follow up with her in 2 weeks. At that time we  can consider addition of 2.5 mg amlodipine or spironolactone 12.5 mg daily. She will continue on hydralazine 12.5 mg three times daily, losartan 100 mg daily, isosorbide mononitrate 30 mg daily, and HCTZ 30 mg daily. Patient may resume her occasional ibuprofen for leg pain since she is in significant pain without it and she is only taking one tablet every so often.   Eddie Candle, PGY1 Pharmacy Resident  Ramond Dial, Pharm.D, BCPS, CPP [place]  [id number], [place], [place] [address]  Phone: [phone]; Fax: [phone]

## 2020-03-29 NOTE — Patient Instructions (Addendum)
It was nice seeing you today!   Your blood pressure remains elevated above goal (<130/80).   Continue hydralazine 12.5 mg three times daily, isosorbide mononitrate 30 mg daily, losartan 100 mg daily, and hydrochlorothiazide 25 mg daily.  Continue exercising as tolerated and eating a balanced diet.   Continue checking your blood pressure at home and bring your readings to your next appointment.   Call us if your blood pressure becomes elevated > 180.   We will not make changes today per your request, but will follow up with you at 10:30 am on 04/12/2020.

## 2020-03-29 NOTE — Telephone Encounter (Signed)
Per Marcelle Overlie, [facility] - pt is presently with her.  She requests order be placed for Lexiscan per Dr Marlou Porch.  Order placed as requested.

## 2020-03-30 ENCOUNTER — Telehealth: Payer: Self-pay | Admitting: Pharmacist

## 2020-03-30 NOTE — Telephone Encounter (Signed)
Patient called stating he was feeling a little light headed. Took her BP and it was 114/54, 113/55, 114/49. It has since come up to 123/63. She feels better. Wants to know if she should take the mid day dose of hydralazine. Advised to skip dose. Ok to take evening dose if BP is high.

## 2020-03-31 ENCOUNTER — Telehealth (HOSPITAL_COMMUNITY): Payer: Self-pay | Admitting: *Deleted

## 2020-03-31 NOTE — Telephone Encounter (Signed)
Patient given detailed instructions per Myocardial Perfusion Study Information Sheet for the test on 04/05/20 at 10:45. Patient notified to arrive 15 minutes early and that it is imperative to arrive on time for appointment to keep from having the test rescheduled.  If you need to cancel or reschedule your appointment, please call the office within 24 hours of your appointment. . Patient verbalized understanding.Barbara Russell

## 2020-04-05 ENCOUNTER — Other Ambulatory Visit: Payer: Self-pay

## 2020-04-05 ENCOUNTER — Ambulatory Visit (HOSPITAL_COMMUNITY): Payer: Medicare Other | Attending: Cardiology

## 2020-04-05 VITALS — Ht 63.0 in | Wt 148.0 lb

## 2020-04-05 DIAGNOSIS — R110 Nausea: Secondary | ICD-10-CM | POA: Diagnosis not present

## 2020-04-05 DIAGNOSIS — R0789 Other chest pain: Secondary | ICD-10-CM

## 2020-04-05 DIAGNOSIS — R0602 Shortness of breath: Secondary | ICD-10-CM

## 2020-04-05 LAB — MYOCARDIAL PERFUSION IMAGING
LV dias vol: 54 mL (ref 46–106)
LV sys vol: 12 mL
Peak HR: 81 {beats}/min
Rest HR: 56 {beats}/min
SDS: 1
SRS: 0
SSS: 1
TID: 1.09

## 2020-04-05 MED ORDER — REGADENOSON 0.4 MG/5ML IV SOLN
0.4000 mg | Freq: Once | INTRAVENOUS | Status: AC
  Administered 2020-04-05: 0.4 mg via INTRAVENOUS

## 2020-04-05 MED ORDER — TECHNETIUM TC 99M TETROFOSMIN IV KIT
31.7000 | PACK | Freq: Once | INTRAVENOUS | Status: AC | PRN
  Administered 2020-04-05: 31.7 via INTRAVENOUS
  Filled 2020-04-05: qty 32

## 2020-04-05 MED ORDER — AMINOPHYLLINE 25 MG/ML IV SOLN
75.0000 mg | Freq: Once | INTRAVENOUS | Status: AC
  Administered 2020-04-05: 75 mg via INTRAVENOUS

## 2020-04-05 MED ORDER — TECHNETIUM TC 99M TETROFOSMIN IV KIT
9.6000 | PACK | Freq: Once | INTRAVENOUS | Status: AC | PRN
  Administered 2020-04-05: 9.6 via INTRAVENOUS
  Filled 2020-04-05: qty 10

## 2020-04-07 DIAGNOSIS — L900 Lichen sclerosus et atrophicus: Secondary | ICD-10-CM | POA: Diagnosis not present

## 2020-04-07 DIAGNOSIS — Z6826 Body mass index (BMI) 26.0-26.9, adult: Secondary | ICD-10-CM | POA: Diagnosis not present

## 2020-04-07 DIAGNOSIS — Z1231 Encounter for screening mammogram for malignant neoplasm of breast: Secondary | ICD-10-CM | POA: Diagnosis not present

## 2020-04-07 DIAGNOSIS — Z124 Encounter for screening for malignant neoplasm of cervix: Secondary | ICD-10-CM | POA: Diagnosis not present

## 2020-04-14 ENCOUNTER — Ambulatory Visit (INDEPENDENT_AMBULATORY_CARE_PROVIDER_SITE_OTHER): Payer: Medicare Other | Admitting: Pharmacist

## 2020-04-14 ENCOUNTER — Other Ambulatory Visit: Payer: Self-pay

## 2020-04-14 VITALS — BP 140/74 | HR 60

## 2020-04-14 DIAGNOSIS — E782 Mixed hyperlipidemia: Secondary | ICD-10-CM | POA: Diagnosis not present

## 2020-04-14 DIAGNOSIS — I2510 Atherosclerotic heart disease of native coronary artery without angina pectoris: Secondary | ICD-10-CM

## 2020-04-14 DIAGNOSIS — I2583 Coronary atherosclerosis due to lipid rich plaque: Secondary | ICD-10-CM

## 2020-04-14 DIAGNOSIS — I10 Essential (primary) hypertension: Secondary | ICD-10-CM | POA: Diagnosis not present

## 2020-04-14 NOTE — Patient Instructions (Addendum)
It was nice to see you today  Take hydralazine with food as needed: -If your systolic(top) blood pressure is 140-160, take hydralazine 12.5mg  (1/2 tablet) -If your systolic blood pressure is higher than 160, take hydralazine 25mg  (1 full tablet)  Continue taking isosorbide (Imdur), losartan 100mg , and HCTZ (hydrochlorothiazide) 25mg  daily  Your blood pressure goal is < 140/15mmHg  Follow up in 4 weeks for a blood pressure check

## 2020-04-14 NOTE — Progress Notes (Signed)
Patient ID: Barbara Russell                 DOB: [birth year]                      MRN: [id number]     HPI: Barbara Russell is a 86 y.o. female referred by Dr. Marguerite Olea HTN clinic.PMH is significant for HTN, CAD s/p MI in 2000, mild aortic stenosis with heart murmur, and DM. Pt was first seen in HTN clinic in March 2021 after BP ranged [id number] systolic. Over the past few months, her amlodipine 5mg  has been stopped due to LEE. Given her elevated LDL, rosuvastatin was also started. On 03/11/2020, she reported tightening in her chest that she thought was associated with hydralazine or rosuvastatin. On 03/17/2020, she reported tolerated 12.5 mg of hydralazine and the rosuvastatin well and was encouraged to increase the hydralazine to 25 mg TID. On 03/25/2020 after increasing hydralazine as instructed, she reported headaches and an increased pulse with some shortness of breath so we decreased hydralazine back down to 12.5 mg TID. Later in the day on 4/15, she reported continued shortness of breath, heart rate in the 90s, and jaw pain. She was encouraged to call EMS and go to the ED. She did go to the ED but left before a full work up was completed because she was frustrated with the wait. Dr. Marlou Porch was notified, follow up stress test on 04/05/20 was low risk with no ischemia noted, EF 79%. At last visit in HTN clinic on 03/29/20, BP was elevated at 158/76. Her lower leg swelling had resolved. She had stopped taking ibuprofen as previously instructed, however this caused significant bilateral leg pain secondary to her arthritis. She was advised she could resume occasional ibuprofen for leg pain. She did not wish to make any medication changes at that time and presents today for follow up.  Pt presents today with thorough log of home BP readings. She checks her BP 3-5x per day using a bicep cuff she has had for about year. Reports she stopped taking her hydralazine on 4/20 altogether because her BP was running low. BP  actually stayed at goal for most readings until 5/2 when it started trending up again. She has taken a few occasional doses of hydralazine when her BP has increased. Reports some nausea she attributes to her hydralazine. She also stopped taking her Crestor because she thought it was causing headaches. Reports her headaches resolved after stopping Crestor. Still taking Imdur, losartan, and HCTZ without issue. Does report some constipation, feeling cold, less energy overall, and some unsteadiness on her feet. She ambulates with a walker and has not had any falls recently. Has used ibuprofen twice since last visit. Takes Tylenol a few times a day for her knee pain. Detailed BP log listed below.  Current HTN meds: Imdur 30mg  daily (3PM) Losartan 100mg  daily (AM) Hydralazine 12.5 mg TID HCTZ 25mg  daily  Previously tried: olmesartan 20mg  daily, furosemide, amlodipine 5 mg daily (LE swelling)  BP goal: will change to <140/55mmHg since pt feels poorly when BP runs low, advanced age, and some imbalance when walking  Family History: Cancer in her mother; Heart disease in her father, HTN in mother  Social History: Denies tobacco and alcohol use  Diet: Does not cook with salt. Eats a banana daily to help with her muscle cramps, does not eat sweets. For breakfast she enjoys oatmeal, bread, and 2 eggs, eats chicken for lunch, and  has been eating vegetables/salad for dinner. Does not eat sweets. Drinks 6-8 larger cups of water daily.  Exercise: limited due to arthritis, back surgery, and knee surgery; Enjoys gardening and tending to flowers in the yard  Home BP readings:  4/21: 115/60, 109/47, 128/52, 144/65, 105/57 4/22:103/52, 120/54, 134/67 4/23: 126/55, 128/65, 138/63, 111/57, 97/53 - felt bad and went to bed 4/24: 128/57, 123/61, 142/71, 161/71 4/25: 122/60, 110/53, 119/57 4/26-4/28: 145/67, 114/59, 151/70, 146/71, 118/57 4/29: 110/56, 148/65, 130/73 4/30: 140/67, 147/73, 139/63 5/1: 136/62,  179/97, 147/69 - took 1/2 tab of hydralazine once this day 5/2: 160/76, 131/56, 147/70 - took 1/2 tab of hydralazine twice this day 5/3: 116/60, 114/53, 147/60 5/4: 152/61, 136/62, 170/79, 193/84 - went to bed because she felt bad  Labs: 03/25/20: K 4, Na 135, SCr 0.98  Wt Readings from Last 3 Encounters:  04/05/20 148 lb (67.1 kg)  01/28/20 148 lb (67.1 kg)  05/30/19 143 lb (64.9 kg)   BP Readings from Last 3 Encounters:  03/29/20 (!) 158/76  03/25/20 (!) 195/86  03/10/20 (!) 150/70   Pulse Readings from Last 3 Encounters:  03/29/20 63  03/25/20 77  03/10/20 63    Renal function: Estimated Creatinine Clearance: 37.2 mL/min (by C-G formula based on SCr of 0.98 mg/dL).  Past Medical History:  Diagnosis Date  . Aortic heart murmur    aortic sclerosis w/out and significant stenosis  . Breast cancer ([place])    Hx of BC, s/p mastectomy on right 1999, s/p tamoxifen X1 year  . Chest pain    EC Stress test 12/21/11 Low risk, no ischemia, normal EF  . Coronary artery disease    Remote Hx of CAD, normal stress test 2009  . Diabetes mellitus without complication ([place])   . Dyspnea   . Hypercholesteremia   . Hypertension   . Lichen sclerosus   . Low back pain   . Myocardial infarction ([place]) 2000  . Osteoarthrosis, unspecified whether generalized or localized, unspecified site   . Pain    Several year Hx of pain/ SOB radiating to her neck  . SOB (shortness of breath)     Current Outpatient Medications on File Prior to Visit  Medication Sig Dispense Refill  . ALPRAZolam (XANAX) 0.25 MG tablet Take 0.125 mg by mouth daily as needed.    Marland Kitchen aspirin 81 MG tablet Take 81 mg by mouth daily.    Marland Kitchen b complex vitamins tablet Take 1 tablet by mouth daily.    . cetirizine (ZYRTEC) 10 MG tablet Take 10 mg by mouth daily as needed for allergies.     . cholecalciferol (VITAMIN D) 1000 UNITS tablet Take 1,000 Units by mouth daily.     Marland Kitchen gabapentin (NEURONTIN) 300 MG capsule Take 600 mg by mouth  at bedtime.     . hydrALAZINE (APRESOLINE) 25 MG tablet Take 0.5 tablets (12.5 mg total) by mouth 3 (three) times daily. 90 tablet 11  . hydrochlorothiazide (HYDRODIURIL) 25 MG tablet Take 25 mg by mouth daily.    . isosorbide mononitrate (IMDUR) 30 MG 24 hr tablet TAKE 1 TABLET BY MOUTH EVERY DAY 90 tablet 2  . losartan (COZAAR) 100 MG tablet Take 1 tablet (100 mg total) by mouth daily. 90 tablet 3  . metFORMIN (GLUCOPHAGE) 500 MG tablet Take 500 mg by mouth daily at 12 noon.    . nitroGLYCERIN (NITROSTAT) 0.4 MG SL tablet PLACE 1 TABLET (0.4 MG TOTAL) UNDER THE TONGUE EVERY 5 (FIVE) MINUTES AS NEEDED FOR  CHEST PAIN. 25 tablet 1  . Omega-3 Fatty Acids (FISH OIL) 1200 MG CAPS Take 1,200 mg by mouth daily.    . ondansetron (ZOFRAN) 4 MG tablet Take 4 mg by mouth as needed.    . rosuvastatin (CRESTOR) 20 MG tablet Take 1 tablet (20 mg total) by mouth daily. 30 tablet 11  . zolpidem (AMBIEN) 5 MG tablet Take 5 mg by mouth at bedtime.      No current facility-administered medications on file prior to visit.    Allergies  Allergen Reactions  . Amlodipine     LEE with 5mg  dosing  . Benadryl [Diphenhydramine Hcl (Sleep)] Other (See Comments)    Hyperstimulation  . Ciprofloxacin Hives  . Lactose Intolerance (Gi) Diarrhea  . Macrobid [Nitrofurantoin Monohyd Macro] Nausea And Vomiting  . Naproxen Other (See Comments)    Stomach pain  . Quinine Sulfate [Quinine] Hives  . Sulfa Antibiotics Hives       . Tramadol Other (See Comments)    unknown to patient  . Penicillins Rash    Has patient had a PCN reaction causing immediate rash, facial/tongue/throat swelling, SOB or lightheadedness with hypotension: Yes Has patient had a PCN reaction causing severe rash involving mucus membranes or skin necrosis: No Has patient had a PCN reaction that required hospitalization: No Has patient had a PCN reaction occurring within the last 10 years: No If all of the above answers are "NO", then may proceed  with Cephalosporin use.   . Prednisone Other (See Comments)    Weakness and pain in joints     Assessment/Plan:  1. Hypertension - Will change BP goal to  <140/73mmHg due to advanced age, pt unsteadiness on her feet, and pt feeling poorly when her BP runs low. Will continue losartan 100mg  daily, HCTZ 25mg  daily, and Imdur 30mg  daily. Will change hydralazine to prn. Advised pt to take 12.5mg  if SBP ranges 140-160 and to take 25mg  if SBP > 160. She will take hydralazine with food to see if this helps with some of her nausea. Will follow up in clinic in 4 weeks for BP check.  2. Hyperlipidemia- LDL goal < 70 due to history of MI. Pt stopped rosuvastatin 20mg  a few weeks ago due to headaches which improved after statin discontinuation. Discussed trying pravastatin. Pt wishes to sort out her BP before trying another statin. Will address again at next visit.  Barbara Russell, PharmD, BCACP, [place] [id number] [place], [place] [address] Phone: [phone]; Fax: [phone] 04/14/2020 11:38 AM

## 2020-04-15 ENCOUNTER — Telehealth: Payer: Self-pay | Admitting: Pharmacist

## 2020-04-15 NOTE — Telephone Encounter (Signed)
Patient called and was questioning whether she could continue using Pennsaid (diclofenac) topical cream due to her HTN. Patient reports she uses it as needed for her knee pain and it is the only thing that has been able to work and help her move around.  Advised patient that Pennsaid is a topical NSAID so there will be some systemic absorption and could have negative effects on her blood pressure.  Recommended she monitor her blood pressure closely (which she has been doing) and try to use the least amount of the cream the least amount of days as possible.  Also advised of risk of GI bleeding between NSAIDs and aspirin and gave patient signs/symptoms to monitor for.  Patient's blood pressure was 120/56 this morning.  Will call back if anything changes.

## 2020-04-26 ENCOUNTER — Other Ambulatory Visit: Payer: Self-pay | Admitting: Cardiology

## 2020-05-12 ENCOUNTER — Ambulatory Visit (INDEPENDENT_AMBULATORY_CARE_PROVIDER_SITE_OTHER): Payer: Medicare Other | Admitting: Pharmacist

## 2020-05-12 ENCOUNTER — Other Ambulatory Visit: Payer: Self-pay

## 2020-05-12 ENCOUNTER — Encounter (INDEPENDENT_AMBULATORY_CARE_PROVIDER_SITE_OTHER): Payer: Self-pay

## 2020-05-12 VITALS — BP 172/68 | HR 67

## 2020-05-12 DIAGNOSIS — E782 Mixed hyperlipidemia: Secondary | ICD-10-CM

## 2020-05-12 DIAGNOSIS — I10 Essential (primary) hypertension: Secondary | ICD-10-CM

## 2020-05-12 DIAGNOSIS — I2583 Coronary atherosclerosis due to lipid rich plaque: Secondary | ICD-10-CM

## 2020-05-12 DIAGNOSIS — I2510 Atherosclerotic heart disease of native coronary artery without angina pectoris: Secondary | ICD-10-CM | POA: Diagnosis not present

## 2020-05-12 NOTE — Progress Notes (Addendum)
Patient ID: Barbara Russell                 DOB: 03-Feb-1930                      MRN: [id number]     HPI: Barbara Russell is a 86 y.o. female referred by Dr. Marguerite [facility] clinic.PMH is significant for HTN, CAD s/p MI in 2000, mild aortic stenosis with heart murmur, and DM. Stress test on 04/05/20 was low risk with no ischemia noted, EF 79%. She has been seen multiple times in HTN clinic over the past few months. BP initially ranged [id number] systolic. Amlodipine has been d/ced due to LEE and hydralazine has been changed to PRN due to side effects and controlled BP. She does continue on prn ibuprofen for significant bilateral leg pain secondary to arthritis. BP goal relaxed to 140/67mmHg at last visit due to patient's age, some gait unsteadiness, and feeling poorly with lower BP readings. She had also stopped taking her rosuvastatin due to headache and did not wish to rechallenge with another statin until her BP was better controlled.  Pt presents today in good spirits. Reports tolerating her medications well and is feeling better overall (attributes this to stopping her rosuvastatin). She keeps a detailed BP log with readings 2-3x per day using a bicep cuff she has had for about a year. She notes when she takes 1/2 or 1 full tablet of hydralazine for elevated BP (1 tablet if SBP > 160, 1/2 tablet if SBP 140-160). BP very well controlled May 5-13. Over the past 10 days, she has taken 1/2 tab or 1 whole tablet of hydralazine most days. 4 readings over the past month have been elevated at 99991111 systolic around 99991111 (notices a headache when readings are that high). Does not report any changes in diet, exercise, or stress levels. Has been hosting bridge. Still has knee pain from her arthritis - has been using Pennsaid  Current HTN meds: Imdur 30mg  daily (3PM) Losartan 100mg  daily (7 AM) Hydralazine prn: 12.5 mg if SBP 140-160 and 25mg  if SBP > 160 HCTZ 25mg  daily  Previously tried: olmesartan 20mg  daily,  furosemide, amlodipine 5 mg daily (LE swelling), higher doses of hydralazine (headaches, increased HR, SOB, nausea)  BP goal: <140/81mmHg due to age and some imbalance when walking  Family History: Cancer in her mother; Heart disease in her father, HTN in mother  Social History: Denies tobacco and alcohol use  Diet: Does not cook with salt. Eats a banana daily to help with her muscle cramps, does not eat sweets. For breakfast she enjoys oatmeal, bread, and 2 eggs, eats chicken for lunch, and has been eating vegetables/salad for dinner. Does not eat sweets. Drinks 6-8 larger cups of water daily.  Exercise: limited due to arthritis, back surgery, and knee surgery; Enjoys gardening and tending to flowers in the yard  Home BP readings: At goal the week of May 5-13. Occasional elevated BP in the AM of 99991111 systolic on 4 occasions over the past month.  Labs: 03/25/20: K 4, Na 135, SCr 0.98  Wt Readings from Last 3 Encounters:  04/05/20 148 lb (67.1 kg)  01/28/20 148 lb (67.1 kg)  05/30/19 143 lb (64.9 kg)   BP Readings from Last 3 Encounters:  04/14/20 140/74  03/29/20 (!) 158/76  03/25/20 (!) 195/86   Pulse Readings from Last 3 Encounters:  04/14/20 60  03/29/20 63  03/25/20 77    Renal  function: CrCl cannot be calculated (Patient's most recent lab result is older than the maximum 21 days allowed.).  Past Medical History:  Diagnosis Date  . Aortic heart murmur    aortic sclerosis w/out and significant stenosis  . Breast cancer (Barbara Russell)    Hx of BC, s/p mastectomy on right 1999, s/p tamoxifen X1 year  . Chest pain    EC Stress test 12/21/11 Low risk, no ischemia, normal EF  . Coronary artery disease    Remote Hx of CAD, normal stress test 2009  . Diabetes mellitus without complication (Barbara Russell)   . Dyspnea   . Hypercholesteremia   . Hypertension   . Lichen sclerosus   . Low back pain   . Myocardial infarction (Barbara Russell) 2000  . Osteoarthrosis, unspecified whether generalized or  localized, unspecified site   . Pain    Several year Hx of pain/ SOB radiating to her neck  . SOB (shortness of breath)     Current Outpatient Medications on File Prior to Visit  Medication Sig Dispense Refill  . ALPRAZolam (XANAX) 0.25 MG tablet Take 0.125 mg by mouth daily as needed.    Marland Kitchen aspirin 81 MG tablet Take 81 mg by mouth daily.    Marland Kitchen b complex vitamins tablet Take 1 tablet by mouth daily.    . cetirizine (ZYRTEC) 10 MG tablet Take 10 mg by mouth daily as needed for allergies.     . cholecalciferol (VITAMIN D) 1000 UNITS tablet Take 1,000 Units by mouth daily.     . diclofenac Sodium (VOLTAREN) 1 % GEL Apply 2 g topically daily as needed.    . gabapentin (NEURONTIN) 300 MG capsule Take 600 mg by mouth at bedtime.     . hydrALAZINE (APRESOLINE) 25 MG tablet Take 12.5 mg by mouth as needed for high blood pressure. 90 tablet 11  . hydrochlorothiazide (HYDRODIURIL) 25 MG tablet Take 25 mg by mouth daily.    . isosorbide mononitrate (IMDUR) 30 MG 24 hr tablet TAKE 1 TABLET BY MOUTH EVERY DAY 90 tablet 2  . losartan (COZAAR) 100 MG tablet Take 1 tablet (100 mg total) by mouth daily. 90 tablet 3  . metFORMIN (GLUCOPHAGE) 500 MG tablet Take 500 mg by mouth daily at 12 noon.    . nitroGLYCERIN (NITROSTAT) 0.4 MG SL tablet PLACE 1 TABLET (0.4 MG TOTAL) UNDER THE TONGUE EVERY 5 (FIVE) MINUTES AS NEEDED FOR CHEST PAIN. 25 tablet 1  . Omega-3 Fatty Acids (FISH OIL) 1200 MG CAPS Take 1,200 mg by mouth daily.    . ondansetron (ZOFRAN) 4 MG tablet Take 4 mg by mouth as needed.    . zolpidem (AMBIEN) 5 MG tablet Take 5 mg by mouth at bedtime.      No current facility-administered medications on file prior to visit.    Allergies  Allergen Reactions  . Amlodipine     LEE with 5mg  dosing  . Benadryl [Diphenhydramine Hcl (Sleep)] Other (See Comments)    Hyperstimulation  . Ciprofloxacin Hives  . Lactose Intolerance (Gi) Diarrhea  . Macrobid [Nitrofurantoin Monohyd Macro] Nausea And Vomiting    . Naproxen Other (See Comments)    Stomach pain  . Quinine Sulfate [Quinine] Hives  . Rosuvastatin     headache  . Sulfa Antibiotics Hives       . Tramadol Other (See Comments)    unknown to patient  . Penicillins Rash    Has patient had a PCN reaction causing immediate rash, facial/tongue/throat swelling, SOB or lightheadedness  with hypotension: Yes Has patient had a PCN reaction causing severe rash involving mucus membranes or skin necrosis: No Has patient had a PCN reaction that required hospitalization: No Has patient had a PCN reaction occurring within the last 10 years: No If all of the above answers are "NO", then may proceed with Cephalosporin use.   . Prednisone Other (See Comments)    Weakness and pain in joints     Assessment/Plan:  1. Hypertension - BP has fluctuated at home with ~50% of readings at goal <140/84mmHg. Prn hydralazine has been helping. Will make AM dose of hydralazine 12.5mg  scheduled (pt will take this with losartan in the AM). She will continue to take hydralazine prn throughout the day. Advised she can take 12.5mg  if SBP 140-160 and 25mg  if SBP > 160. She did not previously tolerate TID dosing. Will continue losartan 100mg  daily, HCTZ 25mg  daily, and Imdur 30mg  daily. Encouraged pt to look for OTC Voltaren gel since her Pennsaid is expensive (still has same active ingredient diclofenac and will have less systemic absorption than oral NSAIDs). Will follow up in clinic in 4 weeks for BP check.  2. Hyperlipidemia- LDL goal < 70 due to history of MI. Baseline LDL 161 checked in March 2021 at PCP. Pt previously intolerant to rosuvastatin 20mg  daily (felt sick to her stomach and had headaches). Discussed trying pravastatin which pt declined today. Will revisit again at next visit.  Roshni Burbano E. Darrion Macaulay, PharmD, BCACP, [place] [id number] [place] [address] Phone: [phone]; Fax: [phone] 05/12/2020 9:00 AM

## 2020-05-12 NOTE — Patient Instructions (Addendum)
It was nice to see you today  Your blood pressure is < 140/36mmHg  You can look for Voltaren gel (diclofenac) for your knee  Start taking hydralazine 1/2 tablet (12.5mg ) in the morning with your losartan  Continue taking your other medications as you have been  Keep an eye on your blood pressure and you can take an additional 1/2 tablet throughout the day if you need to. Space your doses apart at least 4-6 hours  Follow up in 4 weeks for a blood pressure check

## 2020-05-17 DIAGNOSIS — E1169 Type 2 diabetes mellitus with other specified complication: Secondary | ICD-10-CM | POA: Diagnosis not present

## 2020-05-17 DIAGNOSIS — E7800 Pure hypercholesterolemia, unspecified: Secondary | ICD-10-CM | POA: Diagnosis not present

## 2020-05-17 DIAGNOSIS — I10 Essential (primary) hypertension: Secondary | ICD-10-CM | POA: Diagnosis not present

## 2020-05-17 DIAGNOSIS — E785 Hyperlipidemia, unspecified: Secondary | ICD-10-CM | POA: Diagnosis not present

## 2020-05-17 DIAGNOSIS — G4700 Insomnia, unspecified: Secondary | ICD-10-CM | POA: Diagnosis not present

## 2020-05-17 DIAGNOSIS — I2510 Atherosclerotic heart disease of native coronary artery without angina pectoris: Secondary | ICD-10-CM | POA: Diagnosis not present

## 2020-05-17 DIAGNOSIS — E119 Type 2 diabetes mellitus without complications: Secondary | ICD-10-CM | POA: Diagnosis not present

## 2020-05-17 DIAGNOSIS — E1122 Type 2 diabetes mellitus with diabetic chronic kidney disease: Secondary | ICD-10-CM | POA: Diagnosis not present

## 2020-05-22 DIAGNOSIS — M1711 Unilateral primary osteoarthritis, right knee: Secondary | ICD-10-CM | POA: Diagnosis not present

## 2020-05-22 DIAGNOSIS — M25551 Pain in right hip: Secondary | ICD-10-CM | POA: Diagnosis not present

## 2020-05-22 DIAGNOSIS — M542 Cervicalgia: Secondary | ICD-10-CM | POA: Diagnosis not present

## 2020-05-22 DIAGNOSIS — Z9011 Acquired absence of right breast and nipple: Secondary | ICD-10-CM | POA: Diagnosis not present

## 2020-05-22 DIAGNOSIS — E1122 Type 2 diabetes mellitus with diabetic chronic kidney disease: Secondary | ICD-10-CM | POA: Diagnosis not present

## 2020-05-22 DIAGNOSIS — I2510 Atherosclerotic heart disease of native coronary artery without angina pectoris: Secondary | ICD-10-CM | POA: Diagnosis not present

## 2020-05-22 DIAGNOSIS — R531 Weakness: Secondary | ICD-10-CM | POA: Diagnosis not present

## 2020-05-22 DIAGNOSIS — I129 Hypertensive chronic kidney disease with stage 1 through stage 4 chronic kidney disease, or unspecified chronic kidney disease: Secondary | ICD-10-CM | POA: Diagnosis not present

## 2020-05-22 DIAGNOSIS — I252 Old myocardial infarction: Secondary | ICD-10-CM | POA: Diagnosis not present

## 2020-05-22 DIAGNOSIS — N189 Chronic kidney disease, unspecified: Secondary | ICD-10-CM | POA: Diagnosis not present

## 2020-05-22 DIAGNOSIS — R2689 Other abnormalities of gait and mobility: Secondary | ICD-10-CM | POA: Diagnosis not present

## 2020-05-22 DIAGNOSIS — Z853 Personal history of malignant neoplasm of breast: Secondary | ICD-10-CM | POA: Diagnosis not present

## 2020-05-22 DIAGNOSIS — I10 Essential (primary) hypertension: Secondary | ICD-10-CM | POA: Diagnosis not present

## 2020-05-24 DIAGNOSIS — M1711 Unilateral primary osteoarthritis, right knee: Secondary | ICD-10-CM | POA: Diagnosis not present

## 2020-05-24 DIAGNOSIS — R2689 Other abnormalities of gait and mobility: Secondary | ICD-10-CM | POA: Diagnosis not present

## 2020-05-24 DIAGNOSIS — E1122 Type 2 diabetes mellitus with diabetic chronic kidney disease: Secondary | ICD-10-CM | POA: Diagnosis not present

## 2020-05-24 DIAGNOSIS — R531 Weakness: Secondary | ICD-10-CM | POA: Diagnosis not present

## 2020-05-24 DIAGNOSIS — M25551 Pain in right hip: Secondary | ICD-10-CM | POA: Diagnosis not present

## 2020-05-24 DIAGNOSIS — I129 Hypertensive chronic kidney disease with stage 1 through stage 4 chronic kidney disease, or unspecified chronic kidney disease: Secondary | ICD-10-CM | POA: Diagnosis not present

## 2020-05-26 DIAGNOSIS — R2689 Other abnormalities of gait and mobility: Secondary | ICD-10-CM | POA: Diagnosis not present

## 2020-05-26 DIAGNOSIS — I129 Hypertensive chronic kidney disease with stage 1 through stage 4 chronic kidney disease, or unspecified chronic kidney disease: Secondary | ICD-10-CM | POA: Diagnosis not present

## 2020-05-26 DIAGNOSIS — M1711 Unilateral primary osteoarthritis, right knee: Secondary | ICD-10-CM | POA: Diagnosis not present

## 2020-05-26 DIAGNOSIS — M25551 Pain in right hip: Secondary | ICD-10-CM | POA: Diagnosis not present

## 2020-05-26 DIAGNOSIS — E1122 Type 2 diabetes mellitus with diabetic chronic kidney disease: Secondary | ICD-10-CM | POA: Diagnosis not present

## 2020-05-26 DIAGNOSIS — R531 Weakness: Secondary | ICD-10-CM | POA: Diagnosis not present

## 2020-06-01 DIAGNOSIS — R2689 Other abnormalities of gait and mobility: Secondary | ICD-10-CM | POA: Diagnosis not present

## 2020-06-01 DIAGNOSIS — M1711 Unilateral primary osteoarthritis, right knee: Secondary | ICD-10-CM | POA: Diagnosis not present

## 2020-06-01 DIAGNOSIS — E1122 Type 2 diabetes mellitus with diabetic chronic kidney disease: Secondary | ICD-10-CM | POA: Diagnosis not present

## 2020-06-01 DIAGNOSIS — M25551 Pain in right hip: Secondary | ICD-10-CM | POA: Diagnosis not present

## 2020-06-01 DIAGNOSIS — I129 Hypertensive chronic kidney disease with stage 1 through stage 4 chronic kidney disease, or unspecified chronic kidney disease: Secondary | ICD-10-CM | POA: Diagnosis not present

## 2020-06-01 DIAGNOSIS — R531 Weakness: Secondary | ICD-10-CM | POA: Diagnosis not present

## 2020-06-02 DIAGNOSIS — R531 Weakness: Secondary | ICD-10-CM | POA: Diagnosis not present

## 2020-06-02 DIAGNOSIS — M1711 Unilateral primary osteoarthritis, right knee: Secondary | ICD-10-CM | POA: Diagnosis not present

## 2020-06-02 DIAGNOSIS — E1122 Type 2 diabetes mellitus with diabetic chronic kidney disease: Secondary | ICD-10-CM | POA: Diagnosis not present

## 2020-06-02 DIAGNOSIS — I129 Hypertensive chronic kidney disease with stage 1 through stage 4 chronic kidney disease, or unspecified chronic kidney disease: Secondary | ICD-10-CM | POA: Diagnosis not present

## 2020-06-02 DIAGNOSIS — M25551 Pain in right hip: Secondary | ICD-10-CM | POA: Diagnosis not present

## 2020-06-02 DIAGNOSIS — R2689 Other abnormalities of gait and mobility: Secondary | ICD-10-CM | POA: Diagnosis not present

## 2020-06-04 DIAGNOSIS — E1122 Type 2 diabetes mellitus with diabetic chronic kidney disease: Secondary | ICD-10-CM | POA: Diagnosis not present

## 2020-06-04 DIAGNOSIS — R531 Weakness: Secondary | ICD-10-CM | POA: Diagnosis not present

## 2020-06-04 DIAGNOSIS — M1711 Unilateral primary osteoarthritis, right knee: Secondary | ICD-10-CM | POA: Diagnosis not present

## 2020-06-04 DIAGNOSIS — I129 Hypertensive chronic kidney disease with stage 1 through stage 4 chronic kidney disease, or unspecified chronic kidney disease: Secondary | ICD-10-CM | POA: Diagnosis not present

## 2020-06-04 DIAGNOSIS — R2689 Other abnormalities of gait and mobility: Secondary | ICD-10-CM | POA: Diagnosis not present

## 2020-06-04 DIAGNOSIS — M25551 Pain in right hip: Secondary | ICD-10-CM | POA: Diagnosis not present

## 2020-06-07 DIAGNOSIS — R531 Weakness: Secondary | ICD-10-CM | POA: Diagnosis not present

## 2020-06-07 DIAGNOSIS — R2689 Other abnormalities of gait and mobility: Secondary | ICD-10-CM | POA: Diagnosis not present

## 2020-06-07 DIAGNOSIS — M1711 Unilateral primary osteoarthritis, right knee: Secondary | ICD-10-CM | POA: Diagnosis not present

## 2020-06-07 DIAGNOSIS — E1122 Type 2 diabetes mellitus with diabetic chronic kidney disease: Secondary | ICD-10-CM | POA: Diagnosis not present

## 2020-06-07 DIAGNOSIS — I129 Hypertensive chronic kidney disease with stage 1 through stage 4 chronic kidney disease, or unspecified chronic kidney disease: Secondary | ICD-10-CM | POA: Diagnosis not present

## 2020-06-07 DIAGNOSIS — M25551 Pain in right hip: Secondary | ICD-10-CM | POA: Diagnosis not present

## 2020-06-09 ENCOUNTER — Ambulatory Visit (INDEPENDENT_AMBULATORY_CARE_PROVIDER_SITE_OTHER): Payer: Medicare Other | Admitting: Pharmacist

## 2020-06-09 ENCOUNTER — Other Ambulatory Visit: Payer: Self-pay

## 2020-06-09 VITALS — BP 148/62 | HR 65

## 2020-06-09 DIAGNOSIS — I2510 Atherosclerotic heart disease of native coronary artery without angina pectoris: Secondary | ICD-10-CM | POA: Diagnosis not present

## 2020-06-09 DIAGNOSIS — I10 Essential (primary) hypertension: Secondary | ICD-10-CM | POA: Diagnosis not present

## 2020-06-09 DIAGNOSIS — I2583 Coronary atherosclerosis due to lipid rich plaque: Secondary | ICD-10-CM | POA: Diagnosis not present

## 2020-06-09 NOTE — Progress Notes (Addendum)
Patient ID: Barbara Russell                 DOB: Dec 02, 1930                      MRN: [id number]     HPI: Barbara Russell is a 86 y.o. female referred by Dr. Marguerite [facility]a HTN clinic.PMH is significant for HTN, CAD s/p MI in 2000, mild aortic stenosis with heart murmur, and DM. Stress test on 04/05/20 was low risk with no ischemia noted, EF 79%. She has been seen multiple times in HTN clinic over the past few months. BP initially ranged 540-086 systolic. Amlodipine has been d/ced due to LEE and hydralazine has been changed to PRN due to side effects and controlled BP. She does continue on prn ibuprofen for significant bilateral leg pain secondary to arthritis. BP goal relaxed to 140/71mmHg due to patient's age, some gait unsteadiness, and feeling poorly with lower BP readings. She had also stopped taking her rosuvastatin due to headache and did not wish to rechallenge with another statin until her BP was better controlled. At last visit, patient was asked to take AM dose of hydralazine 12.5mg  scheduled (pt will take this with losartan in the AM). She was asked to continue to take hydralazine prn throughout the day. Advised she can take 12.5mg  if SBP 140-160 and 25mg  if SBP > 160.   Patient presents today for follow up. States she is doing well. Started using Voltaren gel and it is helping her knee. She was able to go several days without IBU. She has recently needed 1 IBU per day. Denies dizziness, lightheadedness, headache, blurred vision, SOB, swelling, chest pain or palpitations. She has been taking hydralazine in the AM around 6:45-7AM and then prn the rest of the day. But recently started taking 1 tablet at 7 AM and 1 tablet at noon. Blood pressure at home has been at goal about 50 percent of the time. BP in clinic today was 148/62.  Current HTN meds: Imdur 30mg  daily (3PM) Losartan 100mg  daily (7 AM) Hydralazine 12.5 mg q AM (scheduled) then prn if SBP 140-160 and 25mg  if SBP > 160 HCTZ 25mg   daily  Previously tried: olmesartan 20mg  daily, furosemide, amlodipine 5 mg daily (LE swelling), higher doses of hydralazine (headaches, increased HR, SOB, nausea)  BP goal: <140/25mmHg due to age and some imbalance when walking  Family History: Cancer in her mother; Heart disease in her father, HTN in mother  Social History: Denies tobacco and alcohol use  Diet: Does not cook with salt. Eats a banana daily to help with her muscle cramps, does not eat sweets. For breakfast she enjoys oatmeal, bread, and 2 eggs, eats chicken for lunch, and has been eating vegetables/salad for dinner. Does not eat sweets. Drinks 6-8 larger cups of water daily.  Exercise: limited due to arthritis, back surgery, and knee surgery; Enjoys gardening and tending to flowers in the yard  Home BP readings: 158/78, 137/75, 163/79, 132/64, 128/76, 154/69, 144/74, 149/83, 138/66, 141/73, 147/63, 125/71, 132/70, 129/59, 145/71, 152/77, 126/62, 140/62, 172/77, 164/82, 135/74, 130/66, 140/60, 106/59, 144/72, 162/74, 138/67,138/69, 156/75, 114/63, 160/83, 144/73, 169/80, 132/72, 142/72, 145/70, 140/72, 170/80, (started taking hydralazine 25mg  BID) 160/77, 154/73, 162/76, 163/80, 131/80, 155/80, 125/60, 134/70, 132/68   Labs: 03/25/20: K 4, Na 135, SCr 0.98  Wt Readings from Last 3 Encounters:  04/05/20 148 lb (67.1 kg)  01/28/20 148 lb (67.1 kg)  05/30/19 143 lb (64.9 kg)  BP Readings from Last 3 Encounters:  05/12/20 (!) 172/68  04/14/20 140/74  03/29/20 (!) 158/76   Pulse Readings from Last 3 Encounters:  05/12/20 67  04/14/20 60  03/29/20 63    Renal function: CrCl cannot be calculated (Patient's most recent lab result is older than the maximum 21 days allowed.).  Past Medical History:  Diagnosis Date   Aortic heart murmur    aortic sclerosis w/out and significant stenosis   Breast cancer ([facility])    Hx of BC, s/p mastectomy on right 1999, s/p tamoxifen X1 year   Chest pain    EC Stress test 12/21/11  Low risk, no ischemia, normal EF   Coronary artery disease    Remote Hx of CAD, normal stress test 2009   Diabetes mellitus without complication ([facility])    Dyspnea    Hypercholesteremia    Hypertension    Lichen sclerosus    Low back pain    Myocardial infarction ([facility]) 2000   Osteoarthrosis, unspecified whether generalized or localized, unspecified site    Pain    Several year Hx of pain/ SOB radiating to her neck   SOB (shortness of breath)     Current Outpatient Medications on File Prior to Visit  Medication Sig Dispense Refill   ALPRAZolam (XANAX) 0.25 MG tablet Take 0.125 mg by mouth daily as needed.     aspirin 81 MG tablet Take 81 mg by mouth daily.     b complex vitamins tablet Take 1 tablet by mouth daily.     cetirizine (ZYRTEC) 10 MG tablet Take 10 mg by mouth daily as needed for allergies.      cholecalciferol (VITAMIN D) 1000 UNITS tablet Take 1,000 Units by mouth daily.      diclofenac Sodium (VOLTAREN) 1 % GEL Apply 2 g topically daily as needed.     gabapentin (NEURONTIN) 300 MG capsule Take 600 mg by mouth at bedtime.      hydrALAZINE (APRESOLINE) 25 MG tablet Take 12.5 mg by mouth as needed for high blood pressure. 90 tablet 11   hydrochlorothiazide (HYDRODIURIL) 25 MG tablet Take 25 mg by mouth daily.     isosorbide mononitrate (IMDUR) 30 MG 24 hr tablet TAKE 1 TABLET BY MOUTH EVERY DAY 90 tablet 2   losartan (COZAAR) 100 MG tablet Take 1 tablet (100 mg total) by mouth daily. 90 tablet 3   metFORMIN (GLUCOPHAGE) 500 MG tablet Take 500 mg by mouth daily at 12 noon.     nitroGLYCERIN (NITROSTAT) 0.4 MG SL tablet PLACE 1 TABLET (0.4 MG TOTAL) UNDER THE TONGUE EVERY 5 (FIVE) MINUTES AS NEEDED FOR CHEST PAIN. 25 tablet 1   Omega-3 Fatty Acids (FISH OIL) 1200 MG CAPS Take 1,200 mg by mouth daily.     ondansetron (ZOFRAN) 4 MG tablet Take 4 mg by mouth as needed.     zolpidem (AMBIEN) 5 MG tablet Take 5 mg by mouth at bedtime.      No current  facility-administered medications on file prior to visit.    Allergies  Allergen Reactions   Amlodipine     LEE with 5mg  dosing   Benadryl [Diphenhydramine Hcl (Sleep)] Other (See Comments)    Hyperstimulation   Ciprofloxacin Hives   Lactose Intolerance (Gi) Diarrhea   Macrobid [Nitrofurantoin Monohyd Macro] Nausea And Vomiting   Naproxen Other (See Comments)    Stomach pain   Quinine Sulfate [Quinine] Hives   Rosuvastatin     headache   Sulfa Antibiotics Hives  Tramadol Other (See Comments)    unknown to patient   Penicillins Rash    Has patient had a PCN reaction causing immediate rash, facial/tongue/throat swelling, SOB or lightheadedness with hypotension: Yes Has patient had a PCN reaction causing severe rash involving mucus membranes or skin necrosis: No Has patient had a PCN reaction that required hospitalization: No Has patient had a PCN reaction occurring within the last 10 years: No If all of the above answers are "NO", then may proceed with Cephalosporin use.    Prednisone Other (See Comments)    Weakness and pain in joints     Assessment/Plan:  1. Hypertension - Blood pressure a little above goal of <140/90 today in clinic. Patient just recently started taking hydralazine 25mg  twice a day. I have asked her to continue to take it twice a day and take a third evening dose if her blood pressure is >811 systolic. Will follow up in clinic in 2 months.  2. Hyperlipidemia- Patient not interested in statin at this time. Believes Crestor made her sick.  Ramond Dial, Pharm.D, BCPS, CPP [place]  [address] [place], [place] [address]  Phone: [phone]; Fax: [phone]  06/09/2020 8:41 AM

## 2020-06-09 NOTE — Patient Instructions (Addendum)
It was so nice to see you today!  Please continue taking Imdur 30mg  daily (3PM) Losartan 100mg  daily (7 AM), HCTZ 25mg , and hydralazine 25mg  at 7AM and 12PM. You may take a third dose in the evening if blood pressure is >140 (top number)  Call us at [phone] if you need anything  Follow up in 2 months

## 2020-06-10 DIAGNOSIS — I129 Hypertensive chronic kidney disease with stage 1 through stage 4 chronic kidney disease, or unspecified chronic kidney disease: Secondary | ICD-10-CM | POA: Diagnosis not present

## 2020-06-10 DIAGNOSIS — R531 Weakness: Secondary | ICD-10-CM | POA: Diagnosis not present

## 2020-06-10 DIAGNOSIS — R2689 Other abnormalities of gait and mobility: Secondary | ICD-10-CM | POA: Diagnosis not present

## 2020-06-10 DIAGNOSIS — M25551 Pain in right hip: Secondary | ICD-10-CM | POA: Diagnosis not present

## 2020-06-10 DIAGNOSIS — E1122 Type 2 diabetes mellitus with diabetic chronic kidney disease: Secondary | ICD-10-CM | POA: Diagnosis not present

## 2020-06-10 DIAGNOSIS — M1711 Unilateral primary osteoarthritis, right knee: Secondary | ICD-10-CM | POA: Diagnosis not present

## 2020-06-11 DIAGNOSIS — E1122 Type 2 diabetes mellitus with diabetic chronic kidney disease: Secondary | ICD-10-CM | POA: Diagnosis not present

## 2020-06-11 DIAGNOSIS — M1711 Unilateral primary osteoarthritis, right knee: Secondary | ICD-10-CM | POA: Diagnosis not present

## 2020-06-11 DIAGNOSIS — M25551 Pain in right hip: Secondary | ICD-10-CM | POA: Diagnosis not present

## 2020-06-11 DIAGNOSIS — I129 Hypertensive chronic kidney disease with stage 1 through stage 4 chronic kidney disease, or unspecified chronic kidney disease: Secondary | ICD-10-CM | POA: Diagnosis not present

## 2020-06-11 DIAGNOSIS — R2689 Other abnormalities of gait and mobility: Secondary | ICD-10-CM | POA: Diagnosis not present

## 2020-06-11 DIAGNOSIS — R531 Weakness: Secondary | ICD-10-CM | POA: Diagnosis not present

## 2020-06-19 DIAGNOSIS — I129 Hypertensive chronic kidney disease with stage 1 through stage 4 chronic kidney disease, or unspecified chronic kidney disease: Secondary | ICD-10-CM | POA: Diagnosis not present

## 2020-06-19 DIAGNOSIS — R531 Weakness: Secondary | ICD-10-CM | POA: Diagnosis not present

## 2020-06-19 DIAGNOSIS — R2689 Other abnormalities of gait and mobility: Secondary | ICD-10-CM | POA: Diagnosis not present

## 2020-06-19 DIAGNOSIS — M25551 Pain in right hip: Secondary | ICD-10-CM | POA: Diagnosis not present

## 2020-06-19 DIAGNOSIS — M1711 Unilateral primary osteoarthritis, right knee: Secondary | ICD-10-CM | POA: Diagnosis not present

## 2020-06-19 DIAGNOSIS — E1122 Type 2 diabetes mellitus with diabetic chronic kidney disease: Secondary | ICD-10-CM | POA: Diagnosis not present

## 2020-07-14 ENCOUNTER — Telehealth: Payer: Self-pay | Admitting: Pharmacist

## 2020-07-14 DIAGNOSIS — I10 Essential (primary) hypertension: Secondary | ICD-10-CM

## 2020-07-14 MED ORDER — SPIRONOLACTONE 25 MG PO TABS: 12.5000 mg | ORAL_TABLET | Freq: Every day | ORAL | 11 refills | Status: DC

## 2020-07-14 NOTE — Telephone Encounter (Signed)
Spoke to patient. Advised her that Dr. Marlou Porch would like her to start spironolactone 12.5mg  daily. We will keep hydralazine dose the same for now at 25mg  BID-TID (in past patient had a hard time tolerating higher doses). Lab appointment scheduled for 1 week. Will call patient after labs result and will see how BP is doing.

## 2020-07-14 NOTE — Telephone Encounter (Signed)
Called patient to discuss.  She is feeling good this morning. High BP and pain in back and arm/armpit yesterday was shocking to her.  Out of the blue. Nitro helped.  I asked her to call if symptoms return or call EMS for urgent eval/transport to hospital. She refuses to go to the emergency room again.  She went by EMS recently and her wait was so long in the waiting room and she felt so bad she called her son to pick her up.   She will call our office if symptoms occur again.

## 2020-07-14 NOTE — Telephone Encounter (Signed)
With her blood pressure still being elevated, lets add spironolactone 12.5 mg once a day. Have her come back in 1 week with basic metabolic profile. Candee Furbish, MD

## 2020-07-14 NOTE — Telephone Encounter (Signed)
Patient called stating yesterday she was sitting talking with a friend and had a sudden sharp pain in her upper left back that then traveled down her left arm. She decided to check her blood pressure and it was 220/95. She took a nitroglycerin and pain went away. Took her imdur a little early. Her BP came down to 180 that night. Her face felt hot all day. She checked her BP firs thing in the AM and it was 189/89. Came down to 173/85 after her AM meds. She is currently on Imdur 30mg  daily (3PM), Losartan 100mg  daily (7 AM), Hydralazine 25mg  TID and HCTZ 25mg  daily. I advised that she can try taking hydralazine 37.5mg  (1.5 tablets) to see if this helps bring BP down. Sometimes we can build up tolerance to hydralazine and need higher doses. Patient will try. She has follow up with PharmD clinic at the end of the month. I will send message to triage and Dr. Marlou Porch to see if anything else needs to be done about patients reported arm pain yesterday that has since resolved. Normal stress test in April.

## 2020-07-21 ENCOUNTER — Other Ambulatory Visit: Payer: Medicare Other

## 2020-07-22 ENCOUNTER — Other Ambulatory Visit: Payer: Medicare Other

## 2020-07-22 ENCOUNTER — Other Ambulatory Visit: Payer: Self-pay

## 2020-07-22 DIAGNOSIS — I10 Essential (primary) hypertension: Secondary | ICD-10-CM | POA: Diagnosis not present

## 2020-07-22 LAB — BASIC METABOLIC PANEL
BUN/Creatinine Ratio: 18 (ref 12–28)
BUN: 18 mg/dL (ref 8–27)
CO2: 24 mmol/L (ref 20–29)
Calcium: 10.1 mg/dL (ref 8.7–10.3)
Chloride: 97 mmol/L (ref 96–106)
Creatinine, Ser: 1 mg/dL (ref 0.57–1.00)
GFR calc Af Amer: 59 mL/min/{1.73_m2} — ABNORMAL LOW (ref >59)
GFR calc non Af Amer: 51 mL/min/{1.73_m2} — ABNORMAL LOW (ref >59)
Glucose: 104 mg/dL — ABNORMAL HIGH (ref 65–99)
Potassium: 4.8 mmol/L (ref 3.5–5.2)
Sodium: 135 mmol/L (ref 134–144)

## 2020-07-26 ENCOUNTER — Telehealth: Payer: Self-pay | Admitting: Pharmacist

## 2020-07-26 NOTE — Telephone Encounter (Signed)
Called patient to review her labs. Everything is stable. Continue spironolactone 12.5mg  daily. Patient states that her blood pressure is doing well. Its been in the 130-140's, sometimes 117/60 She is now taking 25mg  of hydralazine in the AM and 1/2 tablet (12.5mg ) at noon and bedtime. Apt with BP clinic rescheduled for the 8th due to scheduling conflict

## 2020-08-04 ENCOUNTER — Ambulatory Visit: Payer: Medicare Other

## 2020-08-11 ENCOUNTER — Other Ambulatory Visit: Payer: Self-pay

## 2020-08-11 ENCOUNTER — Ambulatory Visit (INDEPENDENT_AMBULATORY_CARE_PROVIDER_SITE_OTHER): Payer: Medicare Other | Admitting: Cardiology

## 2020-08-11 ENCOUNTER — Encounter: Payer: Self-pay | Admitting: Cardiology

## 2020-08-11 VITALS — BP 140/60 | HR 70 | Ht 63.0 in | Wt 144.4 lb

## 2020-08-11 DIAGNOSIS — I10 Essential (primary) hypertension: Secondary | ICD-10-CM | POA: Diagnosis not present

## 2020-08-11 DIAGNOSIS — I2510 Atherosclerotic heart disease of native coronary artery without angina pectoris: Secondary | ICD-10-CM | POA: Diagnosis not present

## 2020-08-11 DIAGNOSIS — I2583 Coronary atherosclerosis due to lipid rich plaque: Secondary | ICD-10-CM | POA: Diagnosis not present

## 2020-08-11 DIAGNOSIS — I350 Nonrheumatic aortic (valve) stenosis: Secondary | ICD-10-CM

## 2020-08-11 DIAGNOSIS — E782 Mixed hyperlipidemia: Secondary | ICD-10-CM

## 2020-08-11 NOTE — Progress Notes (Signed)
Cardiology Office Note:    Date:  08/11/2020   ID:  Barbara Russell, DOB Jun 03, 1930, MRN [id number]  PCP:  Seward Carol, MD  [facility] Cardiologist:  Candee Furbish, MD  [facility] Electrophysiologist:  None   Referring MD: Seward Carol, MD     History of Present Illness:    Barbara Russell is a 86 y.o. female here for the follow-up of hypertension.  Appreciate hypertension clinic monitoring him.  Last addition with spironolactone 12.5 mg a day.  Blood pressure was doing well 130s 140s sometimes 117/60.  She is also now taking 25 mg of hydralazine in the a.m. and 12.5 mg at noon and at bedtime.  She did go to the emergency room in April 2021, chest discomfort.  Had nuclear stress test.   Nuclear stress EF: 79%. No wall motion abnormalities detected   There was no ST segment deviation noted during stress.   This is a low risk study. There is no ischemia identified   The study is normal.     Past Medical History:  Diagnosis Date  . Aortic heart murmur    aortic sclerosis w/out and significant stenosis  . Breast cancer ([place])    Hx of BC, s/p mastectomy on right 1999, s/p tamoxifen X1 year  . Chest pain    EC Stress test 12/21/11 Low risk, no ischemia, normal EF  . Coronary artery disease    Remote Hx of CAD, normal stress test 2009  . Diabetes mellitus without complication ([place])   . Dyspnea   . Hypercholesteremia   . Hypertension   . Lichen sclerosus   . Low back pain   . Myocardial infarction ([place]) 2000  . Osteoarthrosis, unspecified whether generalized or localized, unspecified site   . Pain    Several year Hx of pain/ SOB radiating to her neck  . SOB (shortness of breath)     Past Surgical History:  Procedure Laterality Date  . APPENDECTOMY    . HAND SURGERY Bilateral    CTS and trigger finger on right and left  . MASTECTOMY Right 1999  . REPLACEMENT TOTAL KNEE Right 11/2009  . VESICOVAGINAL FISTULA CLOSURE W/ TAH  1969    Current  Medications: Current Meds  Medication Sig  . ALPRAZolam (XANAX) 0.25 MG tablet Take 0.125 mg by mouth daily as needed.  Marland Kitchen aspirin 81 MG tablet Take 81 mg by mouth daily.  Marland Kitchen b complex vitamins tablet Take 1 tablet by mouth daily.  . cetirizine (ZYRTEC) 10 MG tablet Take 10 mg by mouth daily as needed for allergies.   . cholecalciferol (VITAMIN D) 1000 UNITS tablet Take 1,000 Units by mouth daily.   . diclofenac Sodium (VOLTAREN) 1 % GEL Apply 2 g topically daily as needed.  . gabapentin (NEURONTIN) 300 MG capsule Take 600 mg by mouth at bedtime.   . hydrALAZINE (APRESOLINE) 25 MG tablet Take 25 mg by mouth 3 (three) times daily. Patient takes two -three times a day. If BP is low in the evening, she does not take.  . hydrochlorothiazide (HYDRODIURIL) 25 MG tablet Take 25 mg by mouth daily.  . isosorbide mononitrate (IMDUR) 30 MG 24 hr tablet TAKE 1 TABLET BY MOUTH EVERY DAY  . losartan (COZAAR) 100 MG tablet Take 1 tablet (100 mg total) by mouth daily.  . metFORMIN (GLUCOPHAGE) 500 MG tablet Take 500 mg by mouth daily at 12 noon.  . nitroGLYCERIN (NITROSTAT) 0.4 MG SL tablet PLACE 1 TABLET (0.4 MG TOTAL)  UNDER THE TONGUE EVERY 5 (FIVE) MINUTES AS NEEDED FOR CHEST PAIN.  Marland Kitchen Omega-3 Fatty Acids (FISH OIL) 1200 MG CAPS Take 1,200 mg by mouth daily.  . ondansetron (ZOFRAN) 4 MG tablet Take 4 mg by mouth as needed.  Marland Kitchen spironolactone (ALDACTONE) 25 MG tablet Take 0.5 tablets (12.5 mg total) by mouth daily.  Marland Kitchen zolpidem (AMBIEN) 5 MG tablet Take 5 mg by mouth at bedtime.      Allergies:   Amlodipine, Benadryl [diphenhydramine hcl (sleep)], Ciprofloxacin, Lactose intolerance (gi), Macrobid [nitrofurantoin monohyd macro], Naproxen, Quinine sulfate [quinine], Rosuvastatin, Sulfa antibiotics, Tramadol, Penicillins, and Prednisone   Social History   Socioeconomic History  . Marital status: Widowed    Spouse name: Not on file  . Number of children: Not on file  . Years of education: Not on file  .  Highest education level: Not on file  Occupational History  . Not on file  Tobacco Use  . Smoking status: Never Smoker  . Smokeless tobacco: Never Used  Vaping Use  . Vaping Use: Never used  Substance and Sexual Activity  . Alcohol use: No  . Drug use: No  . Sexual activity: Not on file  Other Topics Concern  . Not on file  Social History Narrative  . Not on file   Social Determinants of Health   Financial Resource Strain:   . Difficulty of Paying Living Expenses: Not on file  Food Insecurity:   . Worried About Charity fundraiser in the Last Year: Not on file  . Ran Out of Food in the Last Year: Not on file  Transportation Needs:   . Lack of Transportation (Medical): Not on file  . Lack of Transportation (Non-Medical): Not on file  Physical Activity:   . Days of Exercise per Week: Not on file  . Minutes of Exercise per Session: Not on file  Stress:   . Feeling of Stress : Not on file  Social Connections:   . Frequency of Communication with Friends and Family: Not on file  . Frequency of Social Gatherings with Friends and Family: Not on file  . Attends Religious Services: Not on file  . Active Member of Clubs or Organizations: Not on file  . Attends Archivist Meetings: Not on file  . Marital Status: Not on file     Family History: The patient's family history includes Cancer in her mother; Heart disease in her father.  ROS:   Please see the history of present illness.     All other systems reviewed and are negative.  EKGs/Labs/Other Studies Reviewed:    The following studies were reviewed today: ECHO 2019 - Left ventricle: The cavity size was normal. Wall thickness was  increased in a pattern of moderate LVH. Systolic function was  vigorous. The estimated ejection fraction was in the range of 65%  to 70%. Wall motion was normal; there were no regional wall  motion abnormalities. Doppler parameters are consistent with  abnormal left  ventricular relaxation (grade 1 diastolic  dysfunction). Doppler parameters are consistent with high  ventricular filling pressure.  - Aortic valve: Trileaflet; moderately thickened, moderately  calcified leaflets. Valve mobility was restricted. There was mild  stenosis. Peak velocity (S): 296 cm/s.  - Mitral valve: Severely calcified annulus.  - Left atrium: The atrium was mildly dilated.  - Pericardium, extracardiac: A trivial pericardial effusion was  identified.   Recent Labs: 03/25/2020: Hemoglobin 13.3; Platelets 276 07/22/2020: BUN 18; Creatinine, Ser 1.00; Potassium 4.8;  Sodium 135  Recent Lipid Panel No results found for: CHOL, TRIG, HDL, CHOLHDL, VLDL, LDLCALC, LDLDIRECT  Physical Exam:    VS:  BP 140/60   Pulse 70   Ht 5\' 3"  (1.6 m)   Wt 144 lb 6.4 oz (65.5 kg)   LMP  (LMP Unknown)   SpO2 95%   BMI 25.58 kg/m     Wt Readings from Last 3 Encounters:  08/11/20 144 lb 6.4 oz (65.5 kg)  04/05/20 148 lb (67.1 kg)  01/28/20 148 lb (67.1 kg)     GEN:  Well nourished, well developed in no acute distress HEENT: Normal NECK: No JVD; No carotid bruits LYMPHATICS: No lymphadenopathy CARDIAC: RRR, + systolic murmur, rubs, gallops RESPIRATORY:  Clear to auscultation without rales, wheezing or rhonchi  ABDOMEN: Soft, non-tender, non-distended MUSCULOSKELETAL:  No edema; No deformity  SKIN: Warm and dry NEUROLOGIC:  Alert and oriented x 3 PSYCHIATRIC:  Normal affect   ASSESSMENT:    1. Nonrheumatic aortic valve stenosis   2. Mixed hyperlipidemia   3. Hypertension, unspecified type    PLAN:    In order of problems listed above:  Aortic stenosis -It has been 2 years since last echocardiogram.  Previously described as mild to moderate.  We will repeat.  Heard murmur on exam today.  Essential hypertension -Much better control with the addition of low-dose spironolactone.  Sometimes she does not take her bedtime dose of hydralazine 12.5.  This is  okay.  Generalized body aches at times, arthralgias -Osteoarthritis.  Occasionally she will use a Voltaren gel.  Overall blood pressure has been much better controlled.  Continue to watch.  Lab work reviewed demonstrates creatinine of 1.0 potassium of 4.8, prior hemoglobin 13.3.  Prior chest pain -Lexiscan stress test reassuring in April 2021.  Reviewed.   Medication Adjustments/Labs and Tests Ordered: Current medicines are reviewed at length with the patient today.  Concerns regarding medicines are outlined above.  Orders Placed This Encounter  Procedures  . ECHOCARDIOGRAM COMPLETE   No orders of the defined types were placed in this encounter.   Patient Instructions  Medication Instructions:  The current medical regimen is effective;  continue present plan and medications.  *If you need a refill on your cardiac medications before your next appointment, please call your pharmacy*  Testing/Procedures: Your physician has requested that you have an echocardiogram. Echocardiography is a painless test that uses sound waves to create images of your heart. It provides your doctor with information about the size and shape of your heart and how well your heart's chambers and valves are working. This procedure takes approximately one hour. There are no restrictions for this procedure.  Follow-Up: At [facility], you and your health needs are our priority.  As part of our continuing mission to provide you with exceptional heart care, we have created designated Provider Care Teams.  These Care Teams include your primary Cardiologist (physician) and Advanced Practice Providers (APPs -  Physician Assistants and Nurse Practitioners) who all work together to provide you with the care you need, when you need it.  We recommend signing up for the patient portal called "MyChart".  Sign up information is provided on this After Visit Summary.  MyChart is used to connect with patients for Virtual Visits  (Telemedicine).  Patients are able to view lab/test results, encounter notes, upcoming appointments, etc.  Non-urgent messages can be sent to your provider as well.   To learn more about what you can do with MyChart,  go to [email or web address].    Your next appointment:   6 month(s)  The format for your next appointment:   In Person  Provider:   Candee Furbish, MD   Thank you for choosing [facility]!!         Signed, Candee Furbish, MD  08/11/2020 12:21 PM    [place]

## 2020-08-11 NOTE — Patient Instructions (Signed)
Medication Instructions:  °The current medical regimen is effective;  continue present plan and medications. ° °*If you need a refill on your cardiac medications before your next appointment, please call your pharmacy* ° °Testing/Procedures: °Your physician has requested that you have an echocardiogram. Echocardiography is a painless test that uses sound waves to create images of your heart. It provides your doctor with information about the size and shape of your heart and how well your heart’s chambers and valves are working. This procedure takes approximately one hour. There are no restrictions for this procedure. ° °Follow-Up: °At [facility], you and your health needs are our priority.  As part of our continuing mission to provide you with exceptional heart care, we have created designated Provider Care Teams.  These Care Teams include your primary Cardiologist (physician) and Advanced Practice Providers (APPs -  Physician Assistants and Nurse Practitioners) who all work together to provide you with the care you need, when you need it. ° °We recommend signing up for the patient portal called "MyChart".  Sign up information is provided on this After Visit Summary.  MyChart is used to connect with patients for Virtual Visits (Telemedicine).  Patients are able to view lab/test results, encounter notes, upcoming appointments, etc.  Non-urgent messages can be sent to your provider as well.   °To learn more about what you can do with MyChart, go to [email or web address].   ° °Your next appointment:   °6 month(s) ° °The format for your next appointment:   °In Person ° °Provider:   °Mark Skains, MD { ° ° ° °Thank you for choosing [facility]!! ° ° ° °

## 2020-08-17 NOTE — Progress Notes (Signed)
Patient ID: Barbara Russell                 DOB: [birth year]                      MRN: [id number]     HPI: Barbara Russell is a 86 y.o. female referred by Dr. Marlou Porch to HTN clinic. PMH is significant for HTN, CAD s/p MI in 2000, mild aortic stenosis with heart murmer, and DM. Patient was has been regularly following up with HTN clinic since March and was last here on 06/09/20. At that time, patient's home BP readings were sometimes within goal and sometimes above goal of <140/90. Patient was instructed to increase hydralazine from 12.5 mg to 25 mg BID, and to take a third dose in evening if SBP >140. Dr. Marlou Porch started patient on spironolactone 12.5 mg daily on 07/14/20. Labs drawn on 07/26/20 were stable. At 08/11/20 visit with Dr. Marlou Porch, patient reported taking hydralazine 25 mg in morning and 12.5 mg at noon and at bedtime. BP was doing well and generally within goal.  Today, patient presents to HTN clinic in good spirits. Patient brought home BP readings to visit, which have improved since last visit after spironolactone was added. Patient checks BP regularly and has mostly been within goal <140/90 over past month, except a few outliers. Patient reports BP is usually higher in the morning and comes down at around noon. She takes all BP medications in morning when she wakes up, except Imdur which she takes in the afternoon. Patient self-adjusts hydralazine depending on her BP readings; she may skip a dose if her BP is low. Patient can usually tell when BP is low because she feels light-headed, but this only happens occasionally. Patient reports no headaches.  Office BP was slightly higher today at 150/70, which is not surprising as this was taken in the morning and patient tends to have higher readings in office.   Patient is grateful that she is readily able to get in contact with clinic if she has any concerns.   Current HTN meds:  Losartan 100 mg daily (7:30AM) Imdur 30 mg daily (3PM) Hydralazine  25 mg twice daily, plus additional evening dose if systolic BP >409 Hydrochlorothiazide 25 mg daily (7:30AM) Spironolactone 12.5 mg daily (7:30AM)  Previously tried: Olmesartan 20 mg daily, furosemide, amlodipine 5 mg daily (LE swelling), higher doses of hydralazine (headaches, increased HR, SOB, nausea)  BP goal: <140/90 due to advanced age and some imbalance when walking  Family History: Heart disease (father), HTN (mother), cancer (mother)  Social History: Denies tobacco and alcohol use  Diet: Does not cook with salt. Eats a banana daily to help with her muscle cramps, does not eat sweets. For breakfast she enjoys oatmeal, bread, and 2 eggs, eats chicken for lunch, andhas been eatingvegetables/salad for dinner. Does not eat sweets. Drinks 6-8 larger cups of water daily.  Exercise: Limited due to arthritis, back surgery, and knee surgery. Enjoys gardening and tending to flowers in the yard.  Home BP readings: Recent readings over past few days - 110/53, 110/65, 120/67, 120/67, 108/64, 137/75, 117/60, 121/61; was abnormally high on 08/08/20 at 184/90  Wt Readings from Last 3 Encounters:  08/11/20 144 lb 6.4 oz (65.5 kg)  04/05/20 148 lb (67.1 kg)  01/28/20 148 lb (67.1 kg)   BP Readings from Last 3 Encounters:  08/18/20 (!) 150/70  08/11/20 140/60  06/09/20 (!) 148/62   Pulse Readings from Last  3 Encounters:  08/18/20 69  08/11/20 70  06/09/20 65    Renal function: CrCl cannot be calculated (Patient's most recent lab result is older than the maximum 21 days allowed.).  Past Medical History:  Diagnosis Date   Aortic heart murmur    aortic sclerosis w/out and significant stenosis   Breast cancer ([facility])    Hx of BC, s/p mastectomy on right 1999, s/p tamoxifen X1 year   Chest pain    EC Stress test 12/21/11 Low risk, no ischemia, normal EF   Coronary artery disease    Remote Hx of CAD, normal stress test 2009   Diabetes mellitus without complication ([facility])    Dyspnea     Hypercholesteremia    Hypertension    Lichen sclerosus    Low back pain    Myocardial infarction ([place]) 2000   Osteoarthrosis, unspecified whether generalized or localized, unspecified site    Pain    Several year Hx of pain/ SOB radiating to her neck   SOB (shortness of breath)     Current Outpatient Medications on File Prior to Visit  Medication Sig Dispense Refill   ALPRAZolam (XANAX) 0.25 MG tablet Take 0.125 mg by mouth daily as needed.     aspirin 81 MG tablet Take 81 mg by mouth daily.     b complex vitamins tablet Take 1 tablet by mouth daily.     cetirizine (ZYRTEC) 10 MG tablet Take 10 mg by mouth daily as needed for allergies.      cholecalciferol (VITAMIN D) 1000 UNITS tablet Take 1,000 Units by mouth daily.      diclofenac Sodium (VOLTAREN) 1 % GEL Apply 2 g topically daily as needed.     gabapentin (NEURONTIN) 300 MG capsule Take 600 mg by mouth at bedtime.      hydrALAZINE (APRESOLINE) 25 MG tablet Take 25 mg by mouth 3 (three) times daily. Patient takes two -three times a day. If BP is low in the evening, she does not take. 90 tablet 11   hydrochlorothiazide (HYDRODIURIL) 25 MG tablet Take 25 mg by mouth daily.     isosorbide mononitrate (IMDUR) 30 MG 24 hr tablet TAKE 1 TABLET BY MOUTH EVERY DAY 90 tablet 2   losartan (COZAAR) 100 MG tablet Take 1 tablet (100 mg total) by mouth daily. 90 tablet 3   metFORMIN (GLUCOPHAGE) 500 MG tablet Take 500 mg by mouth daily at 12 noon.     nitroGLYCERIN (NITROSTAT) 0.4 MG SL tablet PLACE 1 TABLET (0.4 MG TOTAL) UNDER THE TONGUE EVERY 5 (FIVE) MINUTES AS NEEDED FOR CHEST PAIN. 25 tablet 1   Omega-3 Fatty Acids (FISH OIL) 1200 MG CAPS Take 1,200 mg by mouth daily.     ondansetron (ZOFRAN) 4 MG tablet Take 4 mg by mouth as needed.     spironolactone (ALDACTONE) 25 MG tablet Take 0.5 tablets (12.5 mg total) by mouth daily. 30 tablet 11   zolpidem (AMBIEN) 5 MG tablet Take 5 mg by mouth at bedtime.      No  current facility-administered medications on file prior to visit.    Allergies  Allergen Reactions   Amlodipine     LEE with 5mg  dosing   Benadryl [Diphenhydramine Hcl (Sleep)] Other (See Comments)    Hyperstimulation   Ciprofloxacin Hives   Lactose Intolerance (Gi) Diarrhea   Macrobid [Nitrofurantoin Monohyd Macro] Nausea And Vomiting   Naproxen Other (See Comments)    Stomach pain   Quinine Sulfate [Quinine] Hives  Rosuvastatin     headache   Sulfa Antibiotics Hives        Tramadol Other (See Comments)    unknown to patient   Penicillins Rash    Has patient had a PCN reaction causing immediate rash, facial/tongue/throat swelling, SOB or lightheadedness with hypotension: Yes Has patient had a PCN reaction causing severe rash involving mucus membranes or skin necrosis: No Has patient had a PCN reaction that required hospitalization: No Has patient had a PCN reaction occurring within the last 10 years: No If all of the above answers are "NO", then may proceed with Cephalosporin use.    Prednisone Other (See Comments)    Weakness and pain in joints    Blood pressure (!) 150/70, pulse 69.   Assessment/Plan:  1. Hypertension - Patient's BP is generally within goal of <140/90. She is doing well on current regimen, with only a few readings above goal and minimal symptoms or side effects. No medication changes are necessary today. Patient will continue her current regimen and contact the clinic if she has any questions or concerns.      Thank you  Esmeralda Links (PharmD Candidate 2022)  Ramond Dial, Pharm.D, BCPS, CPP [place]  [address], [place], [place] [address]  Phone: [phone]; Fax: [phone]

## 2020-08-18 ENCOUNTER — Other Ambulatory Visit: Payer: Self-pay

## 2020-08-18 ENCOUNTER — Ambulatory Visit (INDEPENDENT_AMBULATORY_CARE_PROVIDER_SITE_OTHER): Payer: Medicare Other | Admitting: Pharmacist

## 2020-08-18 VITALS — BP 150/70 | HR 69

## 2020-08-18 DIAGNOSIS — I2583 Coronary atherosclerosis due to lipid rich plaque: Secondary | ICD-10-CM | POA: Diagnosis not present

## 2020-08-18 DIAGNOSIS — I10 Essential (primary) hypertension: Secondary | ICD-10-CM

## 2020-08-18 DIAGNOSIS — I2510 Atherosclerotic heart disease of native coronary artery without angina pectoris: Secondary | ICD-10-CM

## 2020-08-18 NOTE — Patient Instructions (Addendum)
Great to see you today!  No changes today. Continue taking your daily blood pressure medications as you have been:  Losartan 100 mg daily (7:30AM) Hydrochlorothiazide 25 mg daily (7:30AM) Spironolactone 12.5 mg daily (7:30AM) Isosorbide mononitrate 30 mg daily (3PM) Hydralazine 25 mg in morning, and 12.5 mg in afternoon and in evening as needed   Call us if you have any questions or concerns: [phone]

## 2020-08-23 ENCOUNTER — Telehealth: Payer: Self-pay | Admitting: Pharmacist

## 2020-08-23 DIAGNOSIS — I10 Essential (primary) hypertension: Secondary | ICD-10-CM | POA: Diagnosis not present

## 2020-08-23 DIAGNOSIS — I2510 Atherosclerotic heart disease of native coronary artery without angina pectoris: Secondary | ICD-10-CM | POA: Diagnosis not present

## 2020-08-23 DIAGNOSIS — E1169 Type 2 diabetes mellitus with other specified complication: Secondary | ICD-10-CM | POA: Diagnosis not present

## 2020-08-23 DIAGNOSIS — G4700 Insomnia, unspecified: Secondary | ICD-10-CM | POA: Diagnosis not present

## 2020-08-23 DIAGNOSIS — E7800 Pure hypercholesterolemia, unspecified: Secondary | ICD-10-CM | POA: Diagnosis not present

## 2020-08-23 NOTE — Telephone Encounter (Signed)
Patient called about labs she got done at Dr. Delfina Redwood office. Concerned that her GFR is 56. I explained that last month with [place] it was 51. She is right around where she has been the last several months. Also states that her Na was 129. I did advised that this was a little low. Dr. Delfina Redwood should address since he ordered labs, but if he does not, to call me. Could be due to the HCTZ or spironolactone.

## 2020-08-26 NOTE — Telephone Encounter (Signed)
I reviewed patients labs she called about. Scr 0.95, K 4.6, Na 133. These are all stable labs. No changes needed

## 2020-08-30 NOTE — Telephone Encounter (Signed)
Patient called to report some swelling in tops of feet, not painful. Just a little bothersome as far as appearance. Not gotten worse, just constant. No SOB, so significant weight gain.  I advised patient try a pair on compression socks.

## 2020-09-02 ENCOUNTER — Other Ambulatory Visit: Payer: Self-pay

## 2020-09-02 ENCOUNTER — Ambulatory Visit (HOSPITAL_COMMUNITY): Payer: Medicare Other | Attending: Internal Medicine

## 2020-09-02 DIAGNOSIS — I350 Nonrheumatic aortic (valve) stenosis: Secondary | ICD-10-CM

## 2020-09-02 LAB — ECHOCARDIOGRAM COMPLETE
AR max vel: 0.77 cm2
AV Area VTI: 0.91 cm2
AV Area mean vel: 0.9 cm2
AV Mean grad: 20 mmHg
AV Peak grad: 38.7 mmHg
Ao pk vel: 3.11 m/s
Area-P 1/2: 2.62 cm2
S' Lateral: 3.1 cm

## 2020-09-06 ENCOUNTER — Telehealth: Payer: Self-pay | Admitting: Cardiology

## 2020-09-06 DIAGNOSIS — I350 Nonrheumatic aortic (valve) stenosis: Secondary | ICD-10-CM

## 2020-09-06 NOTE — Telephone Encounter (Signed)
Patient returned call for her Echo results.  

## 2020-09-06 NOTE — Telephone Encounter (Signed)
I spoke with patient and reviewed echo results with her.  Order placed for echo to be done in one year

## 2020-09-27 ENCOUNTER — Telehealth: Payer: Self-pay | Admitting: Pharmacist

## 2020-09-27 DIAGNOSIS — R609 Edema, unspecified: Secondary | ICD-10-CM

## 2020-09-27 MED ORDER — FUROSEMIDE 20 MG PO TABS: 10.0000 mg | ORAL_TABLET | Freq: Every day | ORAL | 1 refills | Status: DC | PRN

## 2020-09-27 NOTE — Telephone Encounter (Signed)
Patient called to report feet swelling. States its worse than normal. I will call in Rx for furosemide 10mg  prn. I advised to take 10mg  (1/2) tab for 2 days (will not start until tomorrow). Then call me Thursday morning to report how swelling is doing. She is leaving town on Thursday to go to [employer].

## 2020-09-29 NOTE — Telephone Encounter (Signed)
Agree with plan Mataya Kilduff, MD  

## 2020-09-30 NOTE — Telephone Encounter (Signed)
Patient called to give update on swelling. Swelling is much improved after 2 doses of furosemide 10mg  daily. She does however have some leg cramps. I advised she eat some food high in K like bananas or organges. She is headed to the beach for the weekend. She states she is going to eat as much seafood as she wants and she might have some more salt than normal. Advised from now on she is to use the furosemide 10mg  as needed for swelling. We will check labs on wed when she returns from the beach. This is the next time she will have a ride.

## 2020-09-30 NOTE — Addendum Note (Signed)
Addended by: Marcelle Overlie D on: 09/30/2020 01:14 PM   Modules accepted: Orders

## 2020-10-05 ENCOUNTER — Other Ambulatory Visit: Payer: Medicare Other

## 2020-10-06 ENCOUNTER — Other Ambulatory Visit: Payer: Medicare Other | Admitting: *Deleted

## 2020-10-06 ENCOUNTER — Other Ambulatory Visit: Payer: Self-pay

## 2020-10-06 DIAGNOSIS — R609 Edema, unspecified: Secondary | ICD-10-CM

## 2020-10-06 LAB — BASIC METABOLIC PANEL
BUN/Creatinine Ratio: 17 (ref 12–28)
BUN: 18 mg/dL (ref 8–27)
CO2: 24 mmol/L (ref 20–29)
Calcium: 10.1 mg/dL (ref 8.7–10.3)
Chloride: 99 mmol/L (ref 96–106)
Creatinine, Ser: 1.04 mg/dL — ABNORMAL HIGH (ref 0.57–1.00)
GFR calc Af Amer: 56 mL/min/{1.73_m2} — ABNORMAL LOW (ref >59)
GFR calc non Af Amer: 48 mL/min/{1.73_m2} — ABNORMAL LOW (ref >59)
Glucose: 100 mg/dL — ABNORMAL HIGH (ref 65–99)
Potassium: 5.2 mmol/L (ref 3.5–5.2)
Sodium: 135 mmol/L (ref 134–144)

## 2020-10-08 DIAGNOSIS — E785 Hyperlipidemia, unspecified: Secondary | ICD-10-CM | POA: Diagnosis not present

## 2020-10-08 DIAGNOSIS — I2510 Atherosclerotic heart disease of native coronary artery without angina pectoris: Secondary | ICD-10-CM | POA: Diagnosis not present

## 2020-10-08 DIAGNOSIS — E1122 Type 2 diabetes mellitus with diabetic chronic kidney disease: Secondary | ICD-10-CM | POA: Diagnosis not present

## 2020-10-08 DIAGNOSIS — E119 Type 2 diabetes mellitus without complications: Secondary | ICD-10-CM | POA: Diagnosis not present

## 2020-10-08 DIAGNOSIS — E7800 Pure hypercholesterolemia, unspecified: Secondary | ICD-10-CM | POA: Diagnosis not present

## 2020-10-08 DIAGNOSIS — G4700 Insomnia, unspecified: Secondary | ICD-10-CM | POA: Diagnosis not present

## 2020-10-08 DIAGNOSIS — I10 Essential (primary) hypertension: Secondary | ICD-10-CM | POA: Diagnosis not present

## 2020-10-08 DIAGNOSIS — E1169 Type 2 diabetes mellitus with other specified complication: Secondary | ICD-10-CM | POA: Diagnosis not present

## 2020-10-19 ENCOUNTER — Other Ambulatory Visit: Payer: Self-pay | Admitting: Cardiology

## 2020-10-28 ENCOUNTER — Ambulatory Visit (INDEPENDENT_AMBULATORY_CARE_PROVIDER_SITE_OTHER): Payer: Medicare Other | Admitting: Podiatry

## 2020-10-28 ENCOUNTER — Other Ambulatory Visit: Payer: Self-pay

## 2020-10-28 DIAGNOSIS — M79676 Pain in unspecified toe(s): Secondary | ICD-10-CM

## 2020-10-28 DIAGNOSIS — L600 Ingrowing nail: Secondary | ICD-10-CM | POA: Diagnosis not present

## 2020-10-28 DIAGNOSIS — S90121A Contusion of right lesser toe(s) without damage to nail, initial encounter: Secondary | ICD-10-CM

## 2020-10-28 DIAGNOSIS — M7751 Other enthesopathy of right foot: Secondary | ICD-10-CM | POA: Diagnosis not present

## 2020-11-08 ENCOUNTER — Telehealth: Payer: Self-pay | Admitting: Pharmacist

## 2020-11-08 NOTE — Telephone Encounter (Signed)
Patient called asking if her spike in BP was due to salonpas she is using due to back pain? Advised patient that the patch should not raise her blood pressure. States the patched helped a lot. This AM her BP was 301 systolic. No pain. advised she give it a little more time. Take a break from checking blood pressure for a little bit. Call me if it doesn't come down in a few days.

## 2020-11-10 NOTE — Progress Notes (Signed)
  Subjective:  Patient ID: Barbara Russell, female    DOB: 1930-09-30,  MRN: [id number]  Chief Complaint  Patient presents with  . Nail Problem    R -great toenail pt has had it removed seveal years ago- mentioned she used to be able to remove it herself. she has now been having trouble with it and poss callus on the side of toe    86 y.o. female presents with the above complaint. History confirmed with patient.   Objective:  Physical Exam: warm, good capillary refill, no trophic changes or ulcerative lesions, normal DP and PT pulses and normal sensory exam. Right Foot: contusion right 2nd toe with nail dystrophy    Assessment:   1. Capsulitis of toe, right   2. Ingrown nail   3. Pain around toenail    Plan:  Patient was evaluated and treated and all questions answered.  Nail dystrophy right great toe -Educated on etiology -Nail gently debrided, callus debrided -F/u PRN  No follow-ups on file.

## 2020-11-22 ENCOUNTER — Other Ambulatory Visit: Payer: Self-pay | Admitting: Internal Medicine

## 2020-11-22 DIAGNOSIS — R1310 Dysphagia, unspecified: Secondary | ICD-10-CM | POA: Diagnosis not present

## 2020-12-15 ENCOUNTER — Other Ambulatory Visit: Payer: Self-pay | Admitting: Internal Medicine

## 2020-12-15 ENCOUNTER — Ambulatory Visit
Admission: RE | Admit: 2020-12-15 | Discharge: 2020-12-15 | Disposition: A | Discharge status: Home / Self Care | Payer: Medicare Other | Source: Ambulatory Visit | Attending: Internal Medicine | Admitting: Internal Medicine

## 2020-12-15 DIAGNOSIS — R1310 Dysphagia, unspecified: Secondary | ICD-10-CM | POA: Diagnosis not present

## 2021-01-09 ENCOUNTER — Other Ambulatory Visit: Payer: Self-pay | Admitting: Cardiology

## 2021-01-16 ENCOUNTER — Other Ambulatory Visit: Payer: Self-pay | Admitting: Cardiology

## 2021-01-20 ENCOUNTER — Other Ambulatory Visit: Payer: Self-pay

## 2021-01-20 ENCOUNTER — Ambulatory Visit (INDEPENDENT_AMBULATORY_CARE_PROVIDER_SITE_OTHER): Payer: Medicare Other | Admitting: Podiatry

## 2021-01-20 ENCOUNTER — Encounter: Payer: Self-pay | Admitting: Podiatry

## 2021-01-20 DIAGNOSIS — L600 Ingrowing nail: Secondary | ICD-10-CM

## 2021-01-20 NOTE — Patient Instructions (Signed)

## 2021-01-20 NOTE — Progress Notes (Signed)
Subjective:   Patient ID: Barbara Russell, female   DOB: 87 y.o.   MRN: [id number]   HPI Patient presents with caregiver complaining of an ingrown toenail of the right hallux stating that it gets sore and it feels like there is a spicule in there   ROS      Objective:  Physical Exam  Neurovascular status was found to be intact good digital perfusion noted with the right hallux medial border found to be incurvated and moderately tender when pressed.  There is good digital perfusion well oriented x3     Assessment:  Ingrown toenail deformity right hallux medial border with moderate pain     Plan:  H&P reviewed condition went ahead today discussed correction explaining risk and that hopefully this would solve the problem she is experiencing.  Patient wants to have the procedure and today I infiltrated the right hallux 60 mg like Marcaine mixture sterile prep done remove the medial border cleaned out the bed applied chemical phenol 3 applications 30 seconds followed by alcohol lavage sterile dressing and gave instructions on soaks and to leave dressing on 24 hours but take it off earlier if any throbbing were to occur.  Patient is encouraged to call questions concerns which may arise

## 2021-01-21 ENCOUNTER — Other Ambulatory Visit: Payer: Self-pay | Admitting: Cardiology

## 2021-02-02 DIAGNOSIS — M1990 Unspecified osteoarthritis, unspecified site: Secondary | ICD-10-CM | POA: Insufficient documentation

## 2021-02-02 DIAGNOSIS — M8580 Other specified disorders of bone density and structure, unspecified site: Secondary | ICD-10-CM | POA: Insufficient documentation

## 2021-02-02 DIAGNOSIS — K529 Noninfective gastroenteritis and colitis, unspecified: Secondary | ICD-10-CM | POA: Diagnosis not present

## 2021-02-02 DIAGNOSIS — R1310 Dysphagia, unspecified: Secondary | ICD-10-CM | POA: Diagnosis not present

## 2021-02-02 DIAGNOSIS — C50919 Malignant neoplasm of unspecified site of unspecified female breast: Secondary | ICD-10-CM | POA: Insufficient documentation

## 2021-02-02 DIAGNOSIS — R933 Abnormal findings on diagnostic imaging of other parts of digestive tract: Secondary | ICD-10-CM | POA: Diagnosis not present

## 2021-02-02 DIAGNOSIS — E739 Lactose intolerance, unspecified: Secondary | ICD-10-CM | POA: Diagnosis not present

## 2021-02-10 ENCOUNTER — Other Ambulatory Visit: Payer: Self-pay

## 2021-02-10 ENCOUNTER — Ambulatory Visit (INDEPENDENT_AMBULATORY_CARE_PROVIDER_SITE_OTHER): Payer: Medicare Other | Admitting: Cardiology

## 2021-02-10 ENCOUNTER — Encounter: Payer: Self-pay | Admitting: Cardiology

## 2021-02-10 VITALS — BP 150/70 | HR 67 | Ht 63.0 in | Wt 142.0 lb

## 2021-02-10 DIAGNOSIS — I350 Nonrheumatic aortic (valve) stenosis: Secondary | ICD-10-CM | POA: Diagnosis not present

## 2021-02-10 DIAGNOSIS — I10 Essential (primary) hypertension: Secondary | ICD-10-CM | POA: Diagnosis not present

## 2021-02-10 DIAGNOSIS — R079 Chest pain, unspecified: Secondary | ICD-10-CM

## 2021-02-10 NOTE — Progress Notes (Signed)
Cardiology Office Note:    Date:  02/10/2021   ID:  Barbara Russell, DOB Nov 29, 1930, MRN [id number]  PCP:  Seward Carol, MD   [place]  Cardiologist:  Candee Furbish, MD  Advanced Practice Provider:  No care team member to display Electrophysiologist:  None       Referring MD: Seward Carol, MD     History of Present Illness:    Barbara Russell is a 87 y.o. female here for follow-up of hypertension.  [facility] and hypertension clinic has seen her, appreciate the help.  Prior chest discomfort in April 2021 nuclear stress test showed no abnormalities. Did wake up at night with pressure. SOB at times when walking up a hill or stairs. Worried about this.   Does all her own cooking. Little dog.   Past Medical History:  Diagnosis Date  . Aortic heart murmur    aortic sclerosis w/out and significant stenosis  . Breast cancer ([place])    Hx of BC, s/p mastectomy on right 1999, s/p tamoxifen X1 year  . Chest pain    EC Stress test 12/21/11 Low risk, no ischemia, normal EF  . Coronary artery disease    Remote Hx of CAD, normal stress test 2009  . Diabetes mellitus without complication ([place])   . Dyspnea   . Hypercholesteremia   . Hypertension   . Lichen sclerosus   . Low back pain   . Myocardial infarction ([place]) 2000  . Osteoarthrosis, unspecified whether generalized or localized, unspecified site   . Pain    Several year Hx of pain/ SOB radiating to her neck  . SOB (shortness of breath)     Past Surgical History:  Procedure Laterality Date  . APPENDECTOMY    . HAND SURGERY Bilateral    CTS and trigger finger on right and left  . MASTECTOMY Right 1999  . REPLACEMENT TOTAL KNEE Right 11/2009  . VESICOVAGINAL FISTULA CLOSURE W/ TAH  1969    Current Medications: Current Meds  Medication Sig  . ALPRAZolam (XANAX) 0.25 MG tablet Take 0.125 mg by mouth daily as needed.  Marland Kitchen aspirin 81 MG tablet Take 81 mg by mouth daily.  Marland Kitchen b complex vitamins tablet  Take 1 tablet by mouth daily.  . cetirizine (ZYRTEC) 10 MG tablet Take 10 mg by mouth daily as needed for allergies.   . cholecalciferol (VITAMIN D) 1000 UNITS tablet Take 1,000 Units by mouth daily.   . clindamycin (CLEOCIN) 150 MG capsule SMARTSIG:4 Capsule(s) By Mouth  . diclofenac Sodium (VOLTAREN) 1 % GEL Apply 2 g topically daily as needed.  . furosemide (LASIX) 20 MG tablet Take 0.5 tablets (10 mg total) by mouth as needed.  . gabapentin (NEURONTIN) 300 MG capsule Take 600 mg by mouth at bedtime.   . hydrALAZINE (APRESOLINE) 25 MG tablet Take 25 mg by mouth 3 (three) times daily. Patient takes two -three times a day. If BP is low in the evening, she does not take.  . hydrochlorothiazide (HYDRODIURIL) 25 MG tablet TAKE 1 TABLET BY MOUTH EVERY DAY  . isosorbide mononitrate (IMDUR) 30 MG 24 hr tablet TAKE 1 TABLET BY MOUTH EVERY DAY  . losartan (COZAAR) 100 MG tablet TAKE 1 TABLET BY MOUTH EVERY DAY  . metFORMIN (GLUCOPHAGE) 500 MG tablet Take 500 mg by mouth daily at 12 noon.  . nitroGLYCERIN (NITROSTAT) 0.4 MG SL tablet PLACE 1 TABLET (0.4 MG TOTAL) UNDER THE TONGUE EVERY 5 (FIVE) MINUTES AS NEEDED FOR CHEST  PAIN.  . Omega-3 Fatty Acids (FISH OIL) 1200 MG CAPS Take 1,200 mg by mouth daily.  . ondansetron (ZOFRAN) 4 MG tablet Take 4 mg by mouth as needed.  Marland Kitchen spironolactone (ALDACTONE) 25 MG tablet Take 0.5 tablets (12.5 mg total) by mouth daily.  Marland Kitchen zolpidem (AMBIEN) 5 MG tablet Take 5 mg by mouth at bedtime.      Allergies:   Amlodipine, Benadryl [diphenhydramine hcl (sleep)], Ciprofloxacin, Lactose intolerance (gi), Macrobid [nitrofurantoin monohyd macro], Naproxen, Quinine sulfate [quinine], Rosuvastatin, Sulfa antibiotics, Tramadol, Penicillins, and Prednisone   Social History   Socioeconomic History  . Marital status: Widowed    Spouse name: Not on file  . Number of children: Not on file  . Years of education: Not on file  . Highest education level: Not on file  Occupational  History  . Not on file  Tobacco Use  . Smoking status: Never Smoker  . Smokeless tobacco: Never Used  Vaping Use  . Vaping Use: Never used  Substance and Sexual Activity  . Alcohol use: No  . Drug use: No  . Sexual activity: Not on file  Other Topics Concern  . Not on file  Social History Narrative  . Not on file   Social Determinants of Health   Financial Resource Strain: Not on file  Food Insecurity: Not on file  Transportation Needs: Not on file  Physical Activity: Not on file  Stress: Not on file  Social Connections: Not on file     Family History: The patient's family history includes Cancer in her mother; Heart disease in her father.  ROS:   Please see the history of present illness.     All other systems reviewed and are negative.  EKGs/Labs/Other Studies Reviewed:    The following studies were reviewed today:  ECHO 09/02/20:  1. Left ventricular ejection fraction, by estimation, is 60 to 65%. The  left ventricle has normal function. The left ventricle has no regional  wall motion abnormalities. There is mild left ventricular hypertrophy.  Left ventricular diastolic parameters  are consistent with Grade I diastolic dysfunction (impaired relaxation).  2. Right ventricular systolic function is normal. The right ventricular  size is normal. Tricuspid regurgitation signal is inadequate for assessing  PA pressure.  3. The mitral valve is degenerative. Mild, anteriorly directed mitral  valve regurgitation. The mean mitral valve diastolic gradient is 4.0 mmHg  with average heart rate of 65 bpm. Severe mitral annular calcification.  4. The aortic valve is calcified. There is severe calcifcation of the  aortic valve. Aortic valve regurgitation is not visualized. Moderate  aortic valve stenosis. Aortic valve mean gradient measures 20.0 mmHg.  5. The inferior vena cava is normal in size with greater than 50%  respiratory variability, suggesting right atrial  pressure of 3 mmHg.   EKG:  EKG is  ordered today.  The ekg ordered today demonstrates sinus rhythm 67 no other specific abnormalities  Recent Labs: 03/25/2020: Hemoglobin 13.3; Platelets 276 10/06/2020: BUN 18; Creatinine, Ser 1.04; Potassium 5.2; Sodium 135  Recent Lipid Panel No results found for: CHOL, TRIG, HDL, CHOLHDL, VLDL, LDLCALC, LDLDIRECT   Risk Assessment/Calculations:      Physical Exam:    VS:  BP (!) 150/70 (BP Location: Left Arm, Patient Position: Sitting, Cuff Size: Normal)   Pulse 67   Ht 5\' 3"  (1.6 m)   Wt 142 lb (64.4 kg)   LMP  (LMP Unknown)   SpO2 97%   BMI 25.15 kg/m  Wt Readings from Last 3 Encounters:  02/10/21 142 lb (64.4 kg)  08/11/20 144 lb 6.4 oz (65.5 kg)  04/05/20 148 lb (67.1 kg)     GEN:  Well nourished, well developed in no acute distress HEENT: Normal NECK: No JVD; No carotid bruits LYMPHATICS: No lymphadenopathy CARDIAC: RRR, 2/6 SM, no rubs, gallops RESPIRATORY:  Clear to auscultation without rales, wheezing or rhonchi  ABDOMEN: Soft, non-tender, non-distended MUSCULOSKELETAL:  No edema; No deformity  SKIN: Warm and dry NEUROLOGIC:  Alert and oriented x 3 PSYCHIATRIC:  Normal affect   ASSESSMENT:    1. Chest pain, unspecified type   2. Nonrheumatic aortic valve stenosis   3. Hypertension, unspecified type    PLAN:    In order of problems listed above:  Mild aortic stenosis -Seen on echocardiogram 2021.  We will continue to monitor.  Likely repeat echocardiogram in approximately 3 years. -No current symptoms.  Essential hypertension -Much better control with low-dose spironolactone, appreciate hypertension clinic.  Generalized body aches arthralgias -Osteoarthritis.  Voltaren gel.  Watch blood pressure with NSAID.  Prior chest pain -Stress test reassuring.  Reassurance.  Continue to advocate daily exercise.  She is quite active at home.  Uses her walker to get around.  She does all of her own cooking for  instance.  She does not have stairs.  She does notice some more shortness of breath when going up hills.  Likely a degree of deconditioning.  Recent echo and stress test reassuring.  LDL 144 hemoglobin A1c 6.8 hemoglobin 13.3 creatinine 1.04 potassium 5.2  Esophageal dysmotility -Dr. Earlean Shawl.  Recommended balloon dilatation.  She has put this off for now.  She states that she had rice get stuck in her esophagus, this was quite uncomfortable.   Medication Adjustments/Labs and Tests Ordered: Current medicines are reviewed at length with the patient today.  Concerns regarding medicines are outlined above.  Orders Placed This Encounter  Procedures  . EKG 12-Lead   No orders of the defined types were placed in this encounter.   Patient Instructions  Medication Instructions:  Your physician recommends that you continue on your current medications as directed. Please refer to the Current Medication list given to you today. *If you need a refill on your cardiac medications before your next appointment, please call your pharmacy*   Follow-Up: At [facility], you and your health needs are our priority.  As part of our continuing mission to provide you with exceptional heart care, we have created designated Provider Care Teams.  These Care Teams include your primary Cardiologist (physician) and Advanced Practice Providers (APPs -  Physician Assistants and Nurse Practitioners) who all work together to provide you with the care you need, when you need it.  We recommend signing up for the patient portal called "MyChart".  Sign up information is provided on this After Visit Summary.  MyChart is used to connect with patients for Virtual Visits (Telemedicine).  Patients are able to view lab/test results, encounter notes, upcoming appointments, etc.  Non-urgent messages can be sent to your provider as well.   To learn more about what you can do with MyChart, go to [email or web address].    Your next  appointment:   6 month(s)  The format for your next appointment:   In Person  Provider:   You may see Candee Furbish, MD or one of the following Advanced Practice Providers on your designated Care Team:    Kathyrn Drown, NP  Signed, Candee Furbish, MD  02/10/2021 10:54 AM    [facility]

## 2021-02-10 NOTE — Patient Instructions (Signed)
Medication Instructions:  Your physician recommends that you continue on your current medications as directed. Please refer to the Current Medication list given to you today. *If you need a refill on your cardiac medications before your next appointment, please call your pharmacy*    Follow-Up: At [facility], you and your health needs are our priority.  As part of our continuing mission to provide you with exceptional heart care, we have created designated Provider Care Teams.  These Care Teams include your primary Cardiologist (physician) and Advanced Practice Providers (APPs -  Physician Assistants and Nurse Practitioners) who all work together to provide you with the care you need, when you need it.  We recommend signing up for the patient portal called "MyChart".  Sign up information is provided on this After Visit Summary.  MyChart is used to connect with patients for Virtual Visits (Telemedicine).  Patients are able to view lab/test results, encounter notes, upcoming appointments, etc.  Non-urgent messages can be sent to your provider as well.   To learn more about what you can do with MyChart, go to [email or web address].    Your next appointment:   6 month(s)  The format for your next appointment:   In Person  Provider:   You may see Mark Skains, MD or one of the following Advanced Practice Providers on your designated Care Team:    Jill McDaniel, NP     

## 2021-02-28 DIAGNOSIS — E1169 Type 2 diabetes mellitus with other specified complication: Secondary | ICD-10-CM | POA: Diagnosis not present

## 2021-02-28 DIAGNOSIS — Z1389 Encounter for screening for other disorder: Secondary | ICD-10-CM | POA: Diagnosis not present

## 2021-02-28 DIAGNOSIS — K219 Gastro-esophageal reflux disease without esophagitis: Secondary | ICD-10-CM | POA: Diagnosis not present

## 2021-02-28 DIAGNOSIS — E7800 Pure hypercholesterolemia, unspecified: Secondary | ICD-10-CM | POA: Diagnosis not present

## 2021-02-28 DIAGNOSIS — I2510 Atherosclerotic heart disease of native coronary artery without angina pectoris: Secondary | ICD-10-CM | POA: Diagnosis not present

## 2021-02-28 DIAGNOSIS — N1831 Chronic kidney disease, stage 3a: Secondary | ICD-10-CM | POA: Diagnosis not present

## 2021-02-28 DIAGNOSIS — Z0000 Encounter for general adult medical examination without abnormal findings: Secondary | ICD-10-CM | POA: Diagnosis not present

## 2021-02-28 DIAGNOSIS — I10 Essential (primary) hypertension: Secondary | ICD-10-CM | POA: Diagnosis not present

## 2021-03-02 ENCOUNTER — Telehealth: Payer: Self-pay | Admitting: Pharmacist

## 2021-03-02 NOTE — Telephone Encounter (Signed)
Agree with stopping the spironolactone secondary to the elevated potassium.  Agree with the plan for bananas. Candee Furbish, MD

## 2021-03-02 NOTE — Telephone Encounter (Signed)
Patient called and left a VM on machine that she had a question about blood work done by PCP. Returned patients call. She was concerned about her GFR. Wondering if her medications were affecting it. I reviewed labs found in [facility] eGFR 48- previously 51 Scr 1.1, previously 1.04 K=5.4 previously 5.2  States her blood pressure has been good and she has only been taking hydralazine 64m in the afternoon for the last 3-4 weeks. She did have one blood pressure of 96/47  I'm not too concerned about her GFR/ scr. However I am concerned about her K. I have asked her to stop taking her spironolactone. She is to keep an eye on BP and if it increases she knows to resume taking hydralazine in the AM and PM.  She has been having leg cramps- has had them for 40 years. Eats a banana daily bc of this. This is most certainly adding to higher K. But pt does not want to stop eating- states she cant.  Patient will call back with any issues. Ill reach back out in a few weeks to have her repeat blood work.

## 2021-03-16 ENCOUNTER — Telehealth: Payer: Self-pay

## 2021-03-16 DIAGNOSIS — I10 Essential (primary) hypertension: Secondary | ICD-10-CM

## 2021-03-16 NOTE — Telephone Encounter (Signed)
-----   Message from Ramond Dial, [place] sent at 03/16/2021  2:16 PM EDT ----- Please see if patient will come in for lab to recheck potassium  ----- Message ----- From: Ramond Dial, RPH-CPP Sent: 03/16/2021  12:00 AM EDT To: Ramond Dial, RPH-CPP  Set up lab work to recheck K

## 2021-03-16 NOTE — Telephone Encounter (Signed)
Called to schedule bmet pt stated that they will call if their schedule changes

## 2021-03-21 ENCOUNTER — Other Ambulatory Visit: Payer: Medicare Other | Admitting: *Deleted

## 2021-03-21 ENCOUNTER — Other Ambulatory Visit: Payer: Self-pay

## 2021-03-21 DIAGNOSIS — I10 Essential (primary) hypertension: Secondary | ICD-10-CM

## 2021-03-21 LAB — BASIC METABOLIC PANEL
BUN/Creatinine Ratio: 24 (ref 12–28)
BUN: 25 mg/dL (ref 8–27)
CO2: 24 mmol/L (ref 20–29)
Calcium: 10.2 mg/dL (ref 8.7–10.3)
Chloride: 100 mmol/L (ref 96–106)
Creatinine, Ser: 1.04 mg/dL — ABNORMAL HIGH (ref 0.57–1.00)
Glucose: 108 mg/dL — ABNORMAL HIGH (ref 65–99)
Potassium: 4.7 mmol/L (ref 3.5–5.2)
Sodium: 140 mmol/L (ref 134–144)
eGFR: 52 mL/min/{1.73_m2} — ABNORMAL LOW (ref >59)

## 2021-03-22 ENCOUNTER — Telehealth: Payer: Self-pay | Admitting: Pharmacist

## 2021-03-22 MED ORDER — LOSARTAN POTASSIUM 50 MG PO TABS: 50.0000 mg | ORAL_TABLET | Freq: Two times a day (BID) | ORAL | 3 refills | Status: DC

## 2021-03-22 NOTE — Telephone Encounter (Signed)
K is much better off of spironolactone. Called patient to review labs with her. She states her blood pressure is going very good. In fact she says sometimes its a little low and her eyes are blurry while reading. This AM it was 107/50 something. Has had a reading in the 52'D systolic. Only has to take hydralazine in afternoon. A few days she hasnt needed at all. She is asking if she could get losartan 50mg  tablets and take one in the AM and one in the afternoon. I have sent in new rx for losartan 50mg  BID. Patient will call me with any issues.

## 2021-03-24 DIAGNOSIS — H353131 Nonexudative age-related macular degeneration, bilateral, early dry stage: Secondary | ICD-10-CM | POA: Diagnosis not present

## 2021-03-24 DIAGNOSIS — H18899 Other specified disorders of cornea, unspecified eye: Secondary | ICD-10-CM | POA: Diagnosis not present

## 2021-03-24 DIAGNOSIS — H26493 Other secondary cataract, bilateral: Secondary | ICD-10-CM | POA: Diagnosis not present

## 2021-03-24 DIAGNOSIS — H04123 Dry eye syndrome of bilateral lacrimal glands: Secondary | ICD-10-CM | POA: Diagnosis not present

## 2021-03-25 NOTE — Telephone Encounter (Signed)
Spoke with pt and advised Ian Bushman, [facility], Dr Marlou Porch and RN are out of the office today.  Pt reports BP today as 120/49.  Has not taken Losartan today and plans to hold for now.  Pt states she has taken HCTZ.  Pt denies symptoms of hypotension and states she is feeling fine.  Pt reports she is well hydrated and taking in adequate amounts of salt. Will forward to Ian Bushman, [facility] for review once she returns to the office.  Pt will continue to monitor BP.  Reviewed ED precautions. Pt verbalizes understanding and agrees with current plan.

## 2021-03-25 NOTE — Telephone Encounter (Signed)
Pt c/o BP issue: STAT if pt c/o blurred vision, one-sided weakness or slurred speech  1. What are your last 5 BP readings?  Patient states her BP has been ranging 110/50's and this morning her systolic was 662.  2. Are you having any other symptoms (ex. Dizziness, headache, blurred vision, passed out)?  She states she is asymptomatic.  3. What is your BP issue?   Patient is calling to follow up regarding her BP. She states her BP has gone down tremendously. She states she did not take her BP medication because this morning when she woke up her systolic was 947. She states she feels fine.

## 2021-03-25 NOTE — Telephone Encounter (Signed)
Barbara Russell is off until 4/26 - I'm covering her inbox. Pt's BP readings are normal and she's feeling fine; she should continue on meds she was taking within the prior 24 hours to her normal readings. Her current med list shows her taking HCTZ 25mg  daily, losartan 50mg  twice daily, and hydralazine as needed. If she took her losartan as directed yesterday, that would have contributed to her normal BP today and she should continue on losartan. If she skipped her doses the past few days and her BP is still 120, then she could continue to hold losartan. A normal BP reading means the meds she's taking are working though, not that she should stop her BP medication as this will cause her BP to rise again.

## 2021-03-25 NOTE — Telephone Encounter (Signed)
Spoke with pt and advised of Fuller [place], [facility] comments below.  Pt states she does not understand why suddenly her BP is running low when she has struggled with HTN for such a long time.  Pt verbalizes understanding and states she will continue to monitor BP and call for any additional problems.

## 2021-03-28 ENCOUNTER — Ambulatory Visit (INDEPENDENT_AMBULATORY_CARE_PROVIDER_SITE_OTHER): Payer: Medicare Other | Admitting: Podiatry

## 2021-03-28 ENCOUNTER — Encounter: Payer: Self-pay | Admitting: Podiatry

## 2021-03-28 ENCOUNTER — Ambulatory Visit (INDEPENDENT_AMBULATORY_CARE_PROVIDER_SITE_OTHER): Payer: Medicare Other

## 2021-03-28 ENCOUNTER — Other Ambulatory Visit: Payer: Self-pay

## 2021-03-28 ENCOUNTER — Other Ambulatory Visit: Payer: Self-pay | Admitting: Podiatry

## 2021-03-28 DIAGNOSIS — L600 Ingrowing nail: Secondary | ICD-10-CM | POA: Diagnosis not present

## 2021-03-28 DIAGNOSIS — D169 Benign neoplasm of bone and articular cartilage, unspecified: Secondary | ICD-10-CM | POA: Diagnosis not present

## 2021-03-28 DIAGNOSIS — M79671 Pain in right foot: Secondary | ICD-10-CM | POA: Diagnosis not present

## 2021-03-28 NOTE — Progress Notes (Signed)
Subjective:   Patient ID: Barbara Russell, female   DOB: 87 y.o.   MRN: [id number]   HPI Patient presents with caregiver stating she has had some pain in her big toe right and she is not sure about injury or what made it happen.  States that the ingrown toenail seem to heal well and there is no drainage   ROS      Objective:  Physical Exam  Neurovascular status intact with some redness in the distal portion of the right hallux with irritation of the dorsal structure no drainage and it appears with the ingrown was taken out it is healed well     Assessment:  Possibility for exostotic area or other irritation versus nerve irritation but difficult to see any ingrown toenail deformity     Plan:  Reviewed condition and recommended cushioning for this along with soaks and not keeping the sheet under the bed at nighttime.  If this does not get better we may need to take more nail but I do not see that is being necessary at this time and I did dispense cushioning for the fifth toe    Xray indicated small bone spur with no other apperent pathology

## 2021-03-31 DIAGNOSIS — E1169 Type 2 diabetes mellitus with other specified complication: Secondary | ICD-10-CM | POA: Diagnosis not present

## 2021-03-31 DIAGNOSIS — I10 Essential (primary) hypertension: Secondary | ICD-10-CM | POA: Diagnosis not present

## 2021-03-31 DIAGNOSIS — N1831 Chronic kidney disease, stage 3a: Secondary | ICD-10-CM | POA: Diagnosis not present

## 2021-03-31 DIAGNOSIS — E119 Type 2 diabetes mellitus without complications: Secondary | ICD-10-CM | POA: Diagnosis not present

## 2021-03-31 DIAGNOSIS — E1122 Type 2 diabetes mellitus with diabetic chronic kidney disease: Secondary | ICD-10-CM | POA: Diagnosis not present

## 2021-03-31 DIAGNOSIS — G4700 Insomnia, unspecified: Secondary | ICD-10-CM | POA: Diagnosis not present

## 2021-03-31 DIAGNOSIS — E785 Hyperlipidemia, unspecified: Secondary | ICD-10-CM | POA: Diagnosis not present

## 2021-03-31 DIAGNOSIS — E7800 Pure hypercholesterolemia, unspecified: Secondary | ICD-10-CM | POA: Diagnosis not present

## 2021-03-31 DIAGNOSIS — I2510 Atherosclerotic heart disease of native coronary artery without angina pectoris: Secondary | ICD-10-CM | POA: Diagnosis not present

## 2021-03-31 DIAGNOSIS — K219 Gastro-esophageal reflux disease without esophagitis: Secondary | ICD-10-CM | POA: Diagnosis not present

## 2021-04-01 DIAGNOSIS — Z23 Encounter for immunization: Secondary | ICD-10-CM | POA: Diagnosis not present

## 2021-04-05 DIAGNOSIS — Z961 Presence of intraocular lens: Secondary | ICD-10-CM | POA: Diagnosis not present

## 2021-04-10 ENCOUNTER — Other Ambulatory Visit: Payer: Self-pay | Admitting: Cardiology

## 2021-04-27 ENCOUNTER — Other Ambulatory Visit: Payer: Self-pay | Admitting: Cardiology

## 2021-04-28 ENCOUNTER — Telehealth: Payer: Self-pay | Admitting: Pharmacist

## 2021-04-28 DIAGNOSIS — I10 Essential (primary) hypertension: Secondary | ICD-10-CM

## 2021-04-28 MED ORDER — LOSARTAN POTASSIUM 100 MG PO TABS: 100.0000 mg | ORAL_TABLET | Freq: Every day | ORAL | 1 refills | Status: DC

## 2021-04-28 NOTE — Telephone Encounter (Signed)
Patient called and reports that her blood pressure has been running higher than normal.  Currently takes losartan 50mg  BID but wanted to know if she could be switched to 100mg  in the morning.  Advised that was fine.  She could take two 50mg  tablets in the morning and I would update prescription for her to [facility].  Patient grateful for assistance.

## 2021-06-29 DIAGNOSIS — H5316 Psychophysical visual disturbances: Secondary | ICD-10-CM | POA: Diagnosis not present

## 2021-06-29 DIAGNOSIS — H353133 Nonexudative age-related macular degeneration, bilateral, advanced atrophic without subfoveal involvement: Secondary | ICD-10-CM | POA: Diagnosis not present

## 2021-07-05 ENCOUNTER — Ambulatory Visit (INDEPENDENT_AMBULATORY_CARE_PROVIDER_SITE_OTHER): Payer: Medicare Other | Admitting: Podiatry

## 2021-07-05 ENCOUNTER — Encounter: Payer: Self-pay | Admitting: Podiatry

## 2021-07-05 ENCOUNTER — Other Ambulatory Visit: Payer: Self-pay

## 2021-07-05 DIAGNOSIS — B351 Tinea unguium: Secondary | ICD-10-CM | POA: Diagnosis not present

## 2021-07-05 DIAGNOSIS — M79674 Pain in right toe(s): Secondary | ICD-10-CM | POA: Diagnosis not present

## 2021-07-05 DIAGNOSIS — M79675 Pain in left toe(s): Secondary | ICD-10-CM | POA: Diagnosis not present

## 2021-07-08 NOTE — Progress Notes (Signed)
Subjective: Barbara Russell is a pleasant 87 y.o. female patient seen today painful thick toenails that are difficult to trim. Pain interferes with ambulation. Aggravating factors include wearing enclosed shoe gear. Pain is relieved with periodic professional debridement.  PCP is Seward Carol, MD. Last visit was: 11/22/2020.  She is accompanied by her friend on today's visit.  Allergies  Allergen Reactions   Amlodipine     LEE with '5mg'$  dosing   Benadryl [Diphenhydramine Hcl (Sleep)] Other (See Comments)    Hyperstimulation   Ciprofloxacin Hives   Lactose Intolerance (Gi) Diarrhea   Macrobid [Nitrofurantoin Monohyd Macro] Nausea And Vomiting   Naproxen Other (See Comments)    Stomach pain   Quinine Sulfate [Quinine] Hives   Rosuvastatin     headache   Sulfa Antibiotics Hives        Tramadol Other (See Comments)    unknown to patient   Penicillins Rash    Has patient had a PCN reaction causing immediate rash, facial/tongue/throat swelling, SOB or lightheadedness with hypotension: Yes Has patient had a PCN reaction causing severe rash involving mucus membranes or skin necrosis: No Has patient had a PCN reaction that required hospitalization: No Has patient had a PCN reaction occurring within the last 10 years: No If all of the above answers are "NO", then may proceed with Cephalosporin use.    Prednisone Other (See Comments)    Weakness and pain in joints    Objective: Physical Exam  General: Barbara Russell is a pleasant 87 y.o. Caucasian female, WD, WN in NAD. AAO x 3.   Vascular:  Capillary refill time to digits immediate b/l. Palpable pedal pulses b/l LE. Pedal hair absent. Lower extremity skin temperature gradient within normal limits. No pain with calf compression b/l.  Dermatological:  No open wounds b/l lower extremities. No interdigital macerations b/l lower extremities. Toenails 1-5 b/l elongated, discolored, dystrophic, thickened, crumbly with subungual  debris and tenderness to dorsal palpation.  Musculoskeletal:  Normal muscle strength 5/5 to all lower extremity muscle groups bilaterally. No pain crepitus or joint limitation noted with ROM b/l lower extremities. No gross bony deformities b/l lower extremities.  Neurological:  Protective sensation intact 5/5 intact bilaterally with 10g monofilament b/l.  Assessment and Plan:  1. Pain due to onychomycosis of toenails of both feet   -Examined patient. -Patient to continue soft, supportive shoe gear daily. -Toenails 1-5 bilaterally debrided in length and girth with light bleeding to left 5th digit. This was addressed with Lumicain Hemostatic Solution. Triple antibiotic ointment and band-aid applied. She may remove on tomorrow and apply Neosporin to digit once daily for one week. Call office if she has any problems. -Patient to report any pedal injuries to medical professional immediately. -Patient/POA to call should there be question/concern in the interim.  No follow-ups on file.  Marzetta Board, DPM

## 2021-07-18 ENCOUNTER — Other Ambulatory Visit: Payer: Self-pay | Admitting: Cardiology

## 2021-07-28 ENCOUNTER — Other Ambulatory Visit: Payer: Self-pay | Admitting: Cardiology

## 2021-07-29 ENCOUNTER — Telehealth: Payer: Self-pay | Admitting: Cardiology

## 2021-07-29 MED ORDER — FUROSEMIDE 20 MG PO TABS: 10.0000 mg | ORAL_TABLET | ORAL | 1 refills | Status: DC | PRN

## 2021-07-29 NOTE — Telephone Encounter (Signed)
   Pt c/o medication issue:  1. Name of Medication: Spironolactone 25 mg   2. How are you currently taking this medication (dosage and times per day)?   3. Are you having a reaction (difficulty breathing--STAT)?   4. What is your medication issue? Pt needs refill of this meds, she said she's been taking this for awhile and when she tried refilling it per pharmacy it was denied, this med is not on her med list

## 2021-07-29 NOTE — Telephone Encounter (Signed)
Patient states she has been taking spironolactone because her ankles have been swelling. States swelling doesn't hurt. She doesn't take her furosemide. Hasn't need hydralazine.  Advised she stop taking spironolactone. Can take furosemide '10mg'$  as needed for swelling.

## 2021-07-29 NOTE — Telephone Encounter (Signed)
Spoke with the patient who is asking for a refill on her spironolactone. She would like to speak with a pharmacist in regards to this. I advised her that her spironolactone was discontinued back in March due to elevated potassium. Patient states that no one told her this and she has continued to take her spironolactone 12.5 mg daily. Patient once again requesting to speak with a pharmacist. She would not provide me with any more information. Advised patient that I would send a message to the PharmD team and Dr. Marlou Porch to determine whether she should continue spironolactone or not.

## 2021-08-18 ENCOUNTER — Telehealth: Payer: Self-pay | Admitting: Pharmacist

## 2021-08-18 NOTE — Telephone Encounter (Signed)
Patient called asking if she can take 2 Advil instead of 1. Advised it was ok to take 2 advil ('400mg'$ ) if 1 advil was not working. She only takes it once a day if needed for knee pain. We did discuss advised effects of advil such as GI bleeding and kidney injury. Patient however is taking a low dose as needed.

## 2021-08-19 DIAGNOSIS — M1711 Unilateral primary osteoarthritis, right knee: Secondary | ICD-10-CM | POA: Diagnosis not present

## 2021-08-23 IMAGING — DX DG ABDOMEN 2V
2 series · 2 of 2 positions shown · non-contrast
Comparison: None.

CLINICAL DATA: 89-year-old female with right lower quadrant
abdominal pain. History of breast cancer.

EXAM:
ABDOMEN - 2 VIEW

[dg abd 2 views (1 of 2)]
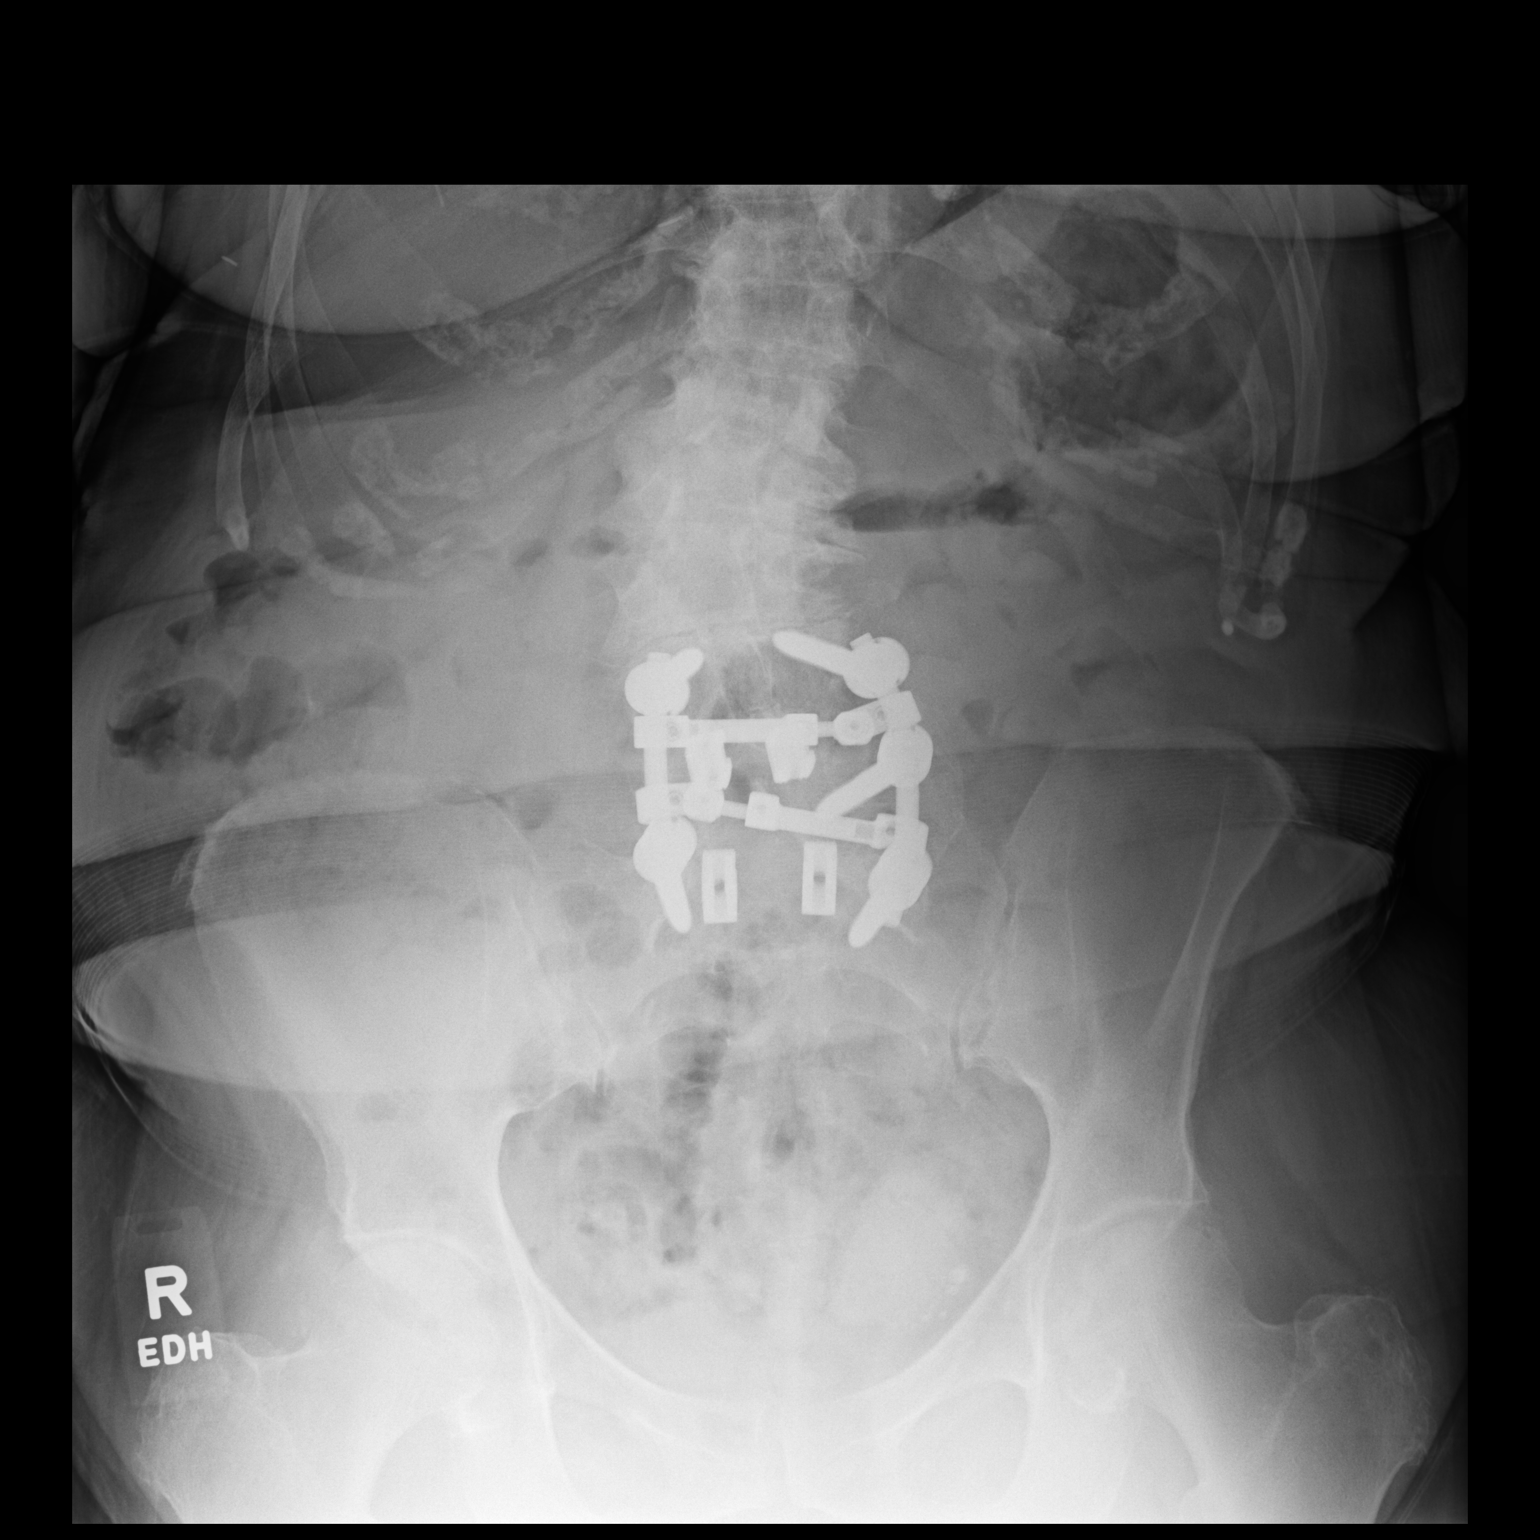

[dg abd 2 views (2 of 2)]
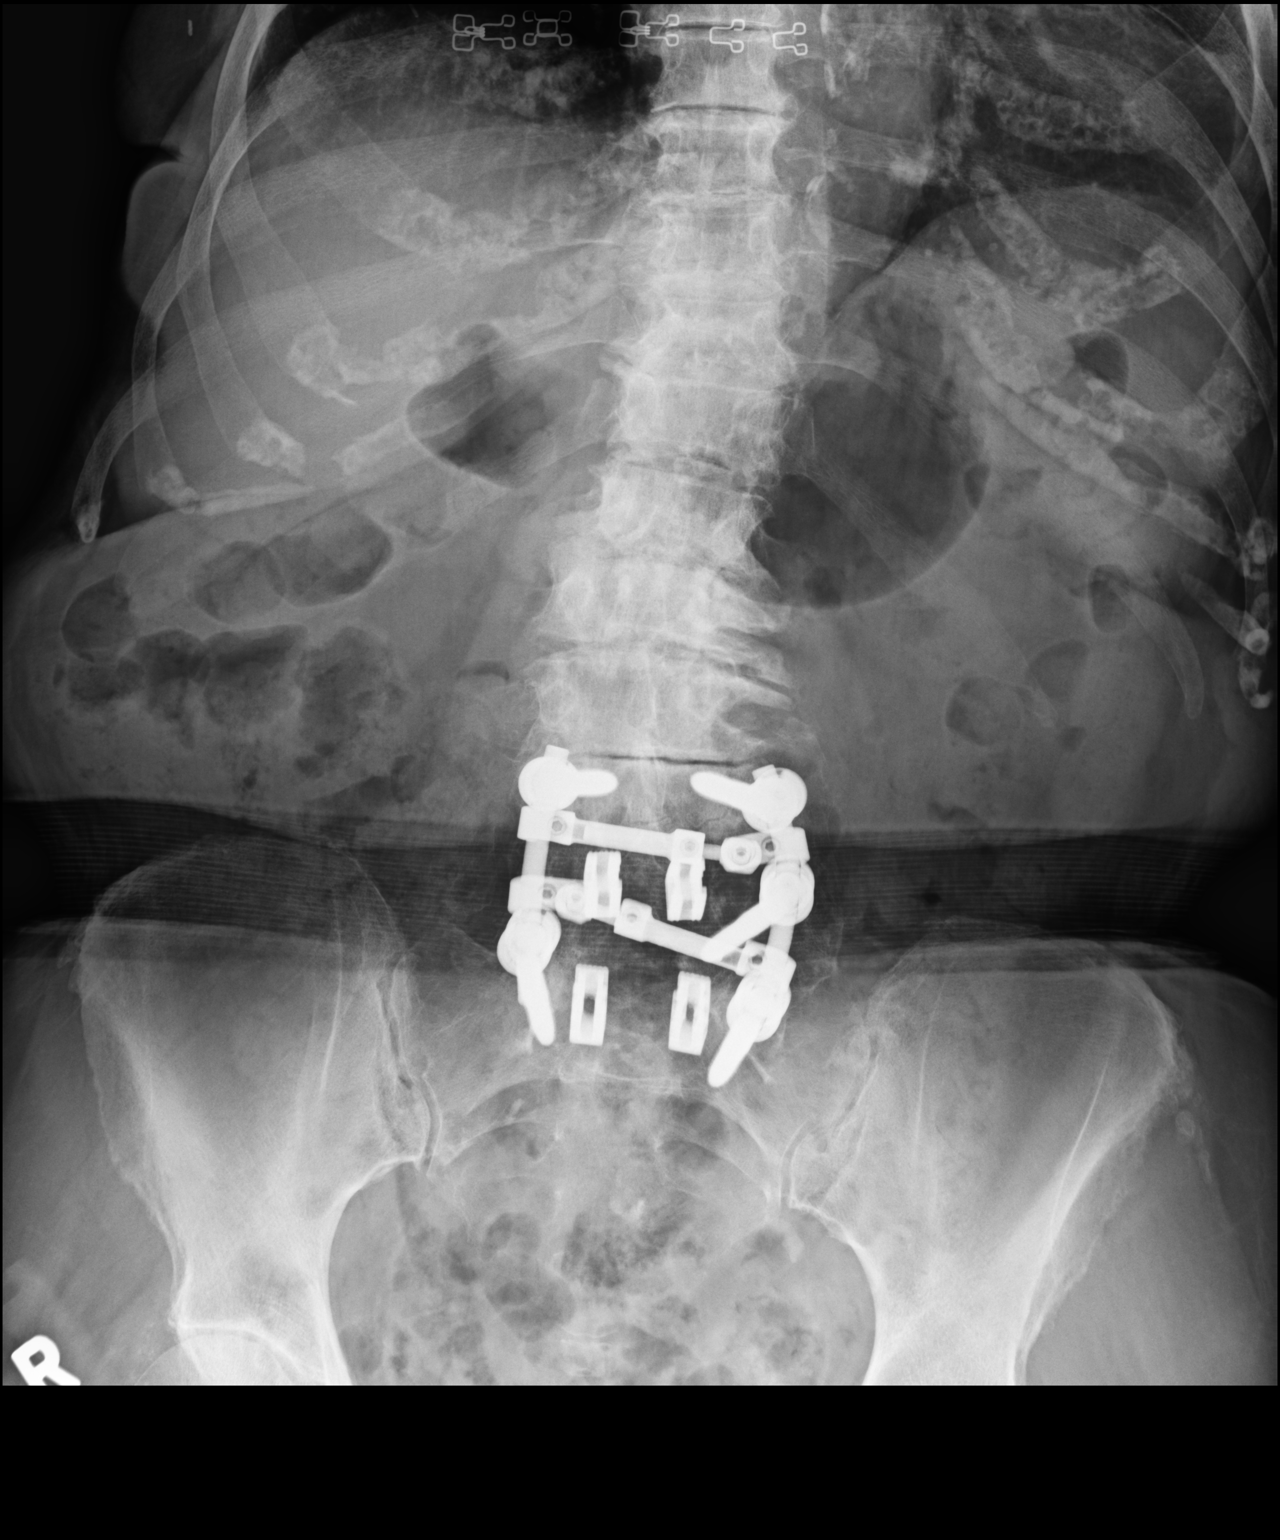

[2 of 2 positions shown; findings below may reference images not displayed]

FINDINGS: There is moderate amount of stool throughout the colon. There is no
bowel dilatation or evidence of obstruction. No free air or
radiopaque calculi. Severe degenerative changes of the spine and
lower lumbar fusion hardware. No acute osseous pathology.
IMPRESSION: No bowel obstruction.

## 2021-08-25 ENCOUNTER — Other Ambulatory Visit: Payer: Self-pay

## 2021-08-25 ENCOUNTER — Ambulatory Visit (HOSPITAL_COMMUNITY): Payer: Medicare Other | Attending: Cardiovascular Disease

## 2021-08-25 DIAGNOSIS — I350 Nonrheumatic aortic (valve) stenosis: Secondary | ICD-10-CM | POA: Diagnosis not present

## 2021-08-25 LAB — ECHOCARDIOGRAM COMPLETE
AR max vel: 0.94 cm2
AV Area VTI: 0.83 cm2
AV Area mean vel: 0.98 cm2
AV Mean grad: 23 mmHg
AV Peak grad: 43.8 mmHg
Ao pk vel: 3.31 m/s
Area-P 1/2: 2.38 cm2
S' Lateral: 1.5 cm

## 2021-08-31 NOTE — Progress Notes (Signed)
Cardiology Office Note:    Date:  09/01/2021   ID:  Barbara Russell, DOB 1930/04/04, MRN [id number]  PCP:  Seward Carol, MD   [place]  Cardiologist:  Candee Furbish, MD  Advanced Practice Provider:  No care team member to display Electrophysiologist:  None       Referring MD: Seward Carol, MD     History of Present Illness:    Barbara Russell is a 87 y.o. female here for follow-up of hypertension, coronary artery disease, and hyperlipidemia. Melissa and hypertension clinic has seen her, appreciated the help.  Prior chest discomfort in April 2021 nuclear stress test showed no abnormalities. Did wake up at night with pressure. She reported shortness of breath at times when walking up a hill or stairs and she was worried. Does all her own cooking and has a little dog.   Today, she is doing pretty good. She reports her lower extremities is bothersome. She experiences left knee pain and has pain in her left thigh however has recently shifted to the right thigh. She includes she took her Losartan 100 mg before visit. She also reports rare dizziness that last for 2 seconds with resting or exertion that happens for 3-4 times a month.  When her blood pressure is low, she experiences blurred vision but denies dizziness. Around 6 pm at night, she can feel a little pain on top of her head when her blood pressure is high. She takes a Hydralazine 25 mg tablet once a week but has not recently. She also notices her blood sugar has been rising recently and wonders should she start taking Metformin 500 mg again.  Melissa told patient to take a Advil twice a day and use body wraps to help the leg pain.   She has stopped driving due to her immobility worsening.   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    Pt denies any chest pains, fatigue, lightheadedness, GI or GU symptoms, syncope, vomiting, LE edema,  headaches or palpitations.   Past Medical History:  Diagnosis Date   Aortic heart murmur    aortic sclerosis w/out and significant stenosis   Breast cancer (HCC)    Hx of BC, s/p mastectomy on right 1999, s/p tamoxifen X1 year   Chest pain    EC Stress test 12/21/11 Low risk, no ischemia, normal EF   Coronary artery disease    Remote Hx of CAD, normal stress test 2009   Diabetes mellitus without complication (HCC)    Dyspnea    Hypercholesteremia    Hypertension    Lichen sclerosus    Low back pain    Myocardial infarction ([place]) 2000   Osteoarthrosis, unspecified whether generalized or localized, unspecified site    Pain    Several year Hx of pain/ SOB radiating to her neck   SOB (shortness of breath)     Past Surgical History:  Procedure Laterality Date   APPENDECTOMY     HAND SURGERY Bilateral    CTS and trigger finger on right and left   MASTECTOMY Right 1999   REPLACEMENT TOTAL KNEE Right 11/2009   VESICOVAGINAL FISTULA CLOSURE W/ TAH  1969    Current Medications: Current Meds  Medication Sig   aspirin 81 MG tablet Take 81 mg by mouth daily.   b complex vitamins tablet Take 1 tablet by mouth daily.   cetirizine (ZYRTEC) 10  MG tablet Take 10 mg by mouth daily as needed for allergies.    cholecalciferol (VITAMIN D) 1000 UNITS tablet Take 1,000 Units by mouth daily.    clindamycin (CLEOCIN) 150 MG capsule    diclofenac Sodium (VOLTAREN) 1 % GEL Apply 2 g topically daily as needed.   furosemide (LASIX) 20 MG tablet Take 0.5 tablets (10 mg total) by mouth as needed.   gabapentin (NEURONTIN) 300 MG capsule Take 600 mg by mouth at bedtime.    hydrALAZINE (APRESOLINE) 25 MG tablet TAKE 1 TABLET BY MOUTH THREE TIMES A DAY (Patient taking differently: as needed.)   hydrochlorothiazide (HYDRODIURIL) 25 MG tablet TAKE 1 TABLET BY MOUTH EVERY DAY   isosorbide mononitrate (IMDUR) 30 MG 24 hr tablet TAKE 1 TABLET BY MOUTH EVERY DAY   losartan (COZAAR) 100 MG tablet Take 1 tablet  (100 mg total) by mouth daily.   metFORMIN (GLUCOPHAGE) 500 MG tablet Take 500 mg by mouth daily at 12 noon.   Multiple Vitamins-Minerals (PRESERVISION AREDS 2 PO) Take by mouth in the morning and at bedtime.   nitroGLYCERIN (NITROSTAT) 0.4 MG SL tablet PLACE 1 TABLET (0.4 MG TOTAL) UNDER THE TONGUE EVERY 5 (FIVE) MINUTES AS NEEDED FOR CHEST PAIN.   Omega-3 Fatty Acids (FISH OIL) 1200 MG CAPS Take 1,200 mg by mouth daily.   ondansetron (ZOFRAN) 4 MG tablet Take 4 mg by mouth as needed.   ONETOUCH ULTRA test strip CHECK ONCE DAILY AS DIRECTED   zolpidem (AMBIEN) 5 MG tablet Take 5 mg by mouth at bedtime.      Allergies:   Amlodipine, Benadryl [diphenhydramine hcl (sleep)], Ciprofloxacin, Lactose intolerance (gi), Macrobid [nitrofurantoin monohyd macro], Naproxen, Quinine sulfate [quinine], Rosuvastatin, Sulfa antibiotics, Tramadol, Penicillins, and Prednisone   Social History   Socioeconomic History   Marital status: Widowed    Spouse name: Not on file   Number of children: Not on file   Years of education: Not on file   Highest education level: Not on file  Occupational History   Not on file  Tobacco Use   Smoking status: Never   Smokeless tobacco: Never  Vaping Use   Vaping Use: Never used  Substance and Sexual Activity   Alcohol use: No   Drug use: No   Sexual activity: Not on file  Other Topics Concern   Not on file  Social History Narrative   Not on file   Social Determinants of Health   Financial Resource Strain: Not on file  Food Insecurity: Not on file  Transportation Needs: Not on file  Physical Activity: Not on file  Stress: Not on file  Social Connections: Not on file     Family History: The patient's family history includes Cancer in her mother; Heart disease in her father.  ROS:   Please see the history of present illness.  (+)  leg pain-both (+)knee pain - right (+)pain on top of head (+)blurred vision  All other systems reviewed and are  negative.  EKGs/Labs/Other Studies Reviewed:    The following studies were reviewed today: ECHO 09/22:  IMPRESSIONS    1. Left ventricular ejection fraction by 3D volume is 70 %. The left  ventricle has hyperdynamic function. The left ventricle has no regional  wall motion abnormalities. Left ventricular diastolic parameters are  consistent with Grade I diastolic  dysfunction (impaired relaxation). Elevated left atrial pressure. The  average left ventricular global longitudinal strain is 23.7 %. The global  longitudinal strain is normal.   2.  Right ventricular systolic function is normal. The right ventricular  size is normal. There is normal pulmonary artery systolic pressure.   3. Left atrial size was mild to moderately dilated.   4. The mitral valve is degenerative. Mild mitral valve regurgitation. No  evidence of mitral stenosis. Severe mitral annular calcification.   5. The aortic valve is tricuspid. There is severe calcification of the  aortic valve. There is severe thickening of the aortic valve. Aortic valve  regurgitation is not visualized. Moderate to severe aortic valve stenosis.   Comparison(s): Prior images reviewed side by side. Aortic stenosis is now  moderate to severe.   ECHO 09/02/20:   1. Left ventricular ejection fraction, by estimation, is 60 to 65%. The  left ventricle has normal function. The left ventricle has no regional  wall motion abnormalities. There is mild left ventricular hypertrophy.  Left ventricular diastolic parameters  are consistent with Grade I diastolic dysfunction (impaired relaxation).   2. Right ventricular systolic function is normal. The right ventricular  size is normal. Tricuspid regurgitation signal is inadequate for assessing  PA pressure.   3. The mitral valve is degenerative. Mild, anteriorly directed mitral  valve regurgitation. The mean mitral valve diastolic gradient is 4.0 mmHg  with average heart rate of 65 bpm. Severe  mitral annular calcification.   4. The aortic valve is calcified. There is severe calcifcation of the  aortic valve. Aortic valve regurgitation is not visualized. Moderate  aortic valve stenosis. Aortic valve mean gradient measures 20.0 mmHg.   5. The inferior vena cava is normal in size with greater than 50%  respiratory variability, suggesting right atrial pressure of 3 mmHg.   MYOCARDIAL PERFUSION 04/21:  Nuclear stress EF: 79%. No wall motion abnormalities detected There was no ST segment deviation noted during stress. This is a low risk study. There is no ischemia identified The study is normal.   ECHO 05/2018:  Study Conclusions   - Left ventricle: The cavity size was normal. Wall thickness was    increased in a pattern of moderate LVH. Systolic function was    vigorous. The estimated ejection fraction was in the range of 65%    to 70%. Wall motion was normal; there were no regional wall    motion abnormalities. Doppler parameters are consistent with    abnormal left ventricular relaxation (grade 1 diastolic    dysfunction). Doppler parameters are consistent with high    ventricular filling pressure.  - Aortic valve: Trileaflet; moderately thickened, moderately    calcified leaflets. Valve mobility was restricted. There was mild    stenosis. Peak velocity (S): 296 cm/s.  - Mitral valve: Severely calcified annulus.  - Left atrium: The atrium was mildly dilated.  - Pericardium, extracardiac: A trivial pericardial effusion was    identified.   EKG:   09/22: no EKG was ordered today. : sinus rhythm 67 no other specific abnormalities  Recent Labs: 03/21/2021: BUN 25; Creatinine, Ser 1.04; Potassium 4.7; Sodium 140  Recent Lipid Panel No results found for: CHOL, TRIG, HDL, CHOLHDL, VLDL, LDLCALC, LDLDIRECT   Risk Assessment/Calculations:      Physical Exam:    VS:  BP 140/72   Pulse (!) 58   Ht 5\' 3"  (1.6 m)   Wt 149 lb 3.2 oz (67.7 kg)   LMP  (LMP Unknown)   SpO2  95%   BMI 26.43 kg/m     Wt Readings from Last 3 Encounters:  09/01/21 149 lb 3.2 oz (67.7 kg)  02/10/21 142 lb (64.4 kg)  08/11/20 144 lb 6.4 oz (65.5 kg)     GEN:  Well nourished, well developed in no acute distress HEENT: Normal NECK: No JVD; No carotid bruits LYMPHATICS: No lymphadenopathy CARDIAC: RRR,  3/6 systolic murmur, no rubs, gallops RESPIRATORY:  Clear to auscultation without rales, wheezing or rhonchi  ABDOMEN: Soft, non-tender, non-distended MUSCULOSKELETAL:  No edema; No deformity  SKIN: Warm and dry NEUROLOGIC:  Alert and oriented x 3 PSYCHIATRIC:  Normal affect   ASSESSMENT:    1. Nonrheumatic aortic valve stenosis   2. Coronary artery disease due to lipid rich plaque   3. Primary hypertension   4. Pure hypercholesterolemia     PLAN:   Aortic stenosis Mild aortic stenosis noted on echocardiogram in 2021.  Echocardiogram personally reviewed.  Continue to monitor.  Murmur heard on exam.  Continue to monitor the trajectory of her aortic stenosis.  Currently asymptomatic.  Coronary artery disease due to lipid rich plaque Remote history of coronary artery disease.  Most recent stress test was low risk with no ischemia.  Reassuring.  Hypertension She showed me several values of blood pressure from home.  Sometimes low sometimes high.  She is not been taking the hydralazine.  Sometimes takes the hydralazine as needed  Hyperlipidemia Last LDL 167.  She will benefit from statin therapy however I do believe that she has had intolerance in the past, has tried rosuvastatin for instance in 2021 and developed headache.  May wish to trial pravastatin in the future.  FOLLOW UP IN 1 year  Medication Adjustments/Labs and Tests Ordered: Current medicines are reviewed at length with the patient today.  Concerns regarding medicines are outlined above.  No orders of the defined types were placed in this encounter.  No orders of the defined types were placed in this  encounter.   Patient Instructions  Medication Instructions:  The current medical regimen is effective;  continue present plan and medications.  *If you need a refill on your cardiac medications before your next appointment, please call your pharmacy*  Follow-Up: At [facility], you and your health needs are our priority.  As part of our continuing mission to provide you with exceptional heart care, we have created designated Provider Care Teams.  These Care Teams include your primary Cardiologist (physician) and Advanced Practice Providers (APPs -  Physician Assistants and Nurse Practitioners) who all work together to provide you with the care you need, when you need it.  We recommend signing up for the patient portal called "MyChart".  Sign up information is provided on this After Visit Summary.  MyChart is used to connect with patients for Virtual Visits (Telemedicine).  Patients are able to view lab/test results, encounter notes, upcoming appointments, etc.  Non-urgent messages can be sent to your provider as well.   To learn more about what you can do with MyChart, go to [email or web address].    Your next appointment:   1 year(s)  The format for your next appointment:   In Person  Provider:   Candee Furbish, MD   Thank you for choosing [facility]!!     I,Jada Bradford,acting as a scribe for Candee Furbish, MD.,have documented all relevant documentation on the behalf of Candee Furbish, MD,as directed by  Candee Furbish, MD while in the presence of Candee Furbish, MD.  I, Candee Furbish, MD, have reviewed all documentation for this visit. The documentation on 09/01/21 for the exam, diagnosis, procedures, and orders are all accurate and  complete.  Signed, Candee Furbish, MD  09/01/2021 10:24 AM    [facility]

## 2021-09-01 ENCOUNTER — Encounter: Payer: Self-pay | Admitting: Cardiology

## 2021-09-01 ENCOUNTER — Ambulatory Visit (INDEPENDENT_AMBULATORY_CARE_PROVIDER_SITE_OTHER): Payer: Medicare Other | Admitting: Cardiology

## 2021-09-01 ENCOUNTER — Other Ambulatory Visit: Payer: Self-pay

## 2021-09-01 DIAGNOSIS — E7800 Pure hypercholesterolemia, unspecified: Secondary | ICD-10-CM | POA: Diagnosis not present

## 2021-09-01 DIAGNOSIS — I2583 Coronary atherosclerosis due to lipid rich plaque: Secondary | ICD-10-CM

## 2021-09-01 DIAGNOSIS — I10 Essential (primary) hypertension: Secondary | ICD-10-CM | POA: Diagnosis not present

## 2021-09-01 DIAGNOSIS — I2510 Atherosclerotic heart disease of native coronary artery without angina pectoris: Secondary | ICD-10-CM | POA: Diagnosis not present

## 2021-09-01 DIAGNOSIS — I350 Nonrheumatic aortic (valve) stenosis: Secondary | ICD-10-CM

## 2021-09-01 NOTE — Patient Instructions (Signed)
Medication Instructions:  The current medical regimen is effective;  continue present plan and medications.  *If you need a refill on your cardiac medications before your next appointment, please call your pharmacy*  Follow-Up: At [facility], you and your health needs are our priority.  As part of our continuing mission to provide you with exceptional heart care, we have created designated Provider Care Teams.  These Care Teams include your primary Cardiologist (physician) and Advanced Practice Providers (APPs -  Physician Assistants and Nurse Practitioners) who all work together to provide you with the care you need, when you need it.  We recommend signing up for the patient portal called "MyChart".  Sign up information is provided on this After Visit Summary.  MyChart is used to connect with patients for Virtual Visits (Telemedicine).  Patients are able to view lab/test results, encounter notes, upcoming appointments, etc.  Non-urgent messages can be sent to your provider as well.   To learn more about what you can do with MyChart, go to [email or web address].    Your next appointment:   1 year(s)  The format for your next appointment:   In Person  Provider:   Mark Skains, MD   Thank you for choosing [facility]!!    

## 2021-09-01 NOTE — Assessment & Plan Note (Signed)
Mild aortic stenosis noted on echocardiogram in 2021.  Echocardiogram personally reviewed.  Continue to monitor.  Murmur heard on exam.  Continue to monitor the trajectory of her aortic stenosis.  Currently asymptomatic.

## 2021-09-01 NOTE — Assessment & Plan Note (Signed)
Last LDL 167.  She will benefit from statin therapy however I do believe that she has had intolerance in the past, has tried rosuvastatin for instance in 2021 and developed headache.  May wish to trial pravastatin in the future.

## 2021-09-01 NOTE — Assessment & Plan Note (Signed)
She showed me several values of blood pressure from home.  Sometimes low sometimes high.  She is not been taking the hydralazine.  Sometimes takes the hydralazine as needed

## 2021-09-01 NOTE — Assessment & Plan Note (Signed)
Remote history of coronary artery disease.  Most recent stress test was low risk with no ischemia.  Reassuring.

## 2021-09-07 DIAGNOSIS — Z7689 Persons encountering health services in other specified circumstances: Secondary | ICD-10-CM | POA: Diagnosis not present

## 2021-09-07 DIAGNOSIS — Z1231 Encounter for screening mammogram for malignant neoplasm of breast: Secondary | ICD-10-CM | POA: Diagnosis not present

## 2021-09-07 DIAGNOSIS — Z01419 Encounter for gynecological examination (general) (routine) without abnormal findings: Secondary | ICD-10-CM | POA: Diagnosis not present

## 2021-09-07 DIAGNOSIS — Z6825 Body mass index (BMI) 25.0-25.9, adult: Secondary | ICD-10-CM | POA: Diagnosis not present

## 2021-09-08 DIAGNOSIS — I10 Essential (primary) hypertension: Secondary | ICD-10-CM | POA: Diagnosis not present

## 2021-09-08 DIAGNOSIS — E7800 Pure hypercholesterolemia, unspecified: Secondary | ICD-10-CM | POA: Diagnosis not present

## 2021-09-08 DIAGNOSIS — I350 Nonrheumatic aortic (valve) stenosis: Secondary | ICD-10-CM | POA: Diagnosis not present

## 2021-09-08 DIAGNOSIS — M159 Polyosteoarthritis, unspecified: Secondary | ICD-10-CM | POA: Diagnosis not present

## 2021-09-08 DIAGNOSIS — E1169 Type 2 diabetes mellitus with other specified complication: Secondary | ICD-10-CM | POA: Diagnosis not present

## 2021-09-08 DIAGNOSIS — N1831 Chronic kidney disease, stage 3a: Secondary | ICD-10-CM | POA: Diagnosis not present

## 2021-10-10 ENCOUNTER — Ambulatory Visit (INDEPENDENT_AMBULATORY_CARE_PROVIDER_SITE_OTHER): Payer: Medicare Other | Admitting: Podiatry

## 2021-10-10 ENCOUNTER — Other Ambulatory Visit: Payer: Self-pay

## 2021-10-10 ENCOUNTER — Encounter: Payer: Self-pay | Admitting: Podiatry

## 2021-10-10 ENCOUNTER — Ambulatory Visit: Payer: Medicare Other | Admitting: Podiatry

## 2021-10-10 DIAGNOSIS — M79675 Pain in left toe(s): Secondary | ICD-10-CM

## 2021-10-10 DIAGNOSIS — B351 Tinea unguium: Secondary | ICD-10-CM

## 2021-10-10 DIAGNOSIS — M79674 Pain in right toe(s): Secondary | ICD-10-CM

## 2021-10-11 ENCOUNTER — Ambulatory Visit: Payer: Medicare Other | Admitting: Podiatry

## 2021-10-13 NOTE — Progress Notes (Signed)
Subjective: Barbara Russell is a 87 y.o. female patient seen today for follow up of  painful thick toenails that are difficult to trim. Pain interferes with ambulation. Aggravating factors include wearing enclosed shoe gear. Pain is relieved with periodic professional debridement.  New problems reported today: None.  PCP is Seward Carol, MD. Last visit was: 10/03/2021.  Allergies  Allergen Reactions   Amlodipine     LEE with 5mg  dosing   Benadryl [Diphenhydramine Hcl (Sleep)] Other (See Comments)    Hyperstimulation   Ciprofloxacin Hives   Lactose Intolerance (Gi) Diarrhea   Macrobid [Nitrofurantoin Monohyd Macro] Nausea And Vomiting   Naproxen Other (See Comments)    Stomach pain   Quinine Sulfate [Quinine] Hives   Rosuvastatin     headache   Sulfa Antibiotics Hives        Tramadol Other (See Comments)    unknown to patient   Penicillins Rash    Has patient had a PCN reaction causing immediate rash, facial/tongue/throat swelling, SOB or lightheadedness with hypotension: Yes Has patient had a PCN reaction causing severe rash involving mucus membranes or skin necrosis: No Has patient had a PCN reaction that required hospitalization: No Has patient had a PCN reaction occurring within the last 10 years: No If all of the above answers are "NO", then may proceed with Cephalosporin use.    Prednisone Other (See Comments)    Weakness and pain in joints    Objective: Physical Exam  General: Patient is a pleasant 87 y.o. Caucasian female in NAD. AAO x 3.   Neurovascular Examination: CFT immediate b/l LE. Faintly palpable DP/PT pulses b/l LE. Digital hair absent b/l. Skin temperature gradient WNL b/l. No pain with calf compression b/l. No edema noted b/l. Lower extremity skin temperature gradient within normal limits.  Protective sensation intact 5/5 intact bilaterally with 10g monofilament b/l.  Dermatological:  Pedal integument with normal turgor, texture and tone b/l LE.  No open wounds b/l. No interdigital macerations b/l. Toenails 1-5 b/l elongated, thickened, discolored with subungual debris. +Tenderness with dorsal palpation of nailplates. No hyperkeratotic or porokeratotic lesions present. Toenails 1-5 b/l elongated, discolored, dystrophic, thickened, crumbly with subungual debris and tenderness to dorsal palpation.  Musculoskeletal:  Normal muscle strength 5/5 to all lower extremity muscle groups bilaterally. No pain crepitus or joint limitation noted with ROM b/l lower extremities. No gross bony deformities b/l lower extremities.  Assessment: 1. Pain due to onychomycosis of toenails of both feet    Plan: Patient was evaluated and treated and all questions answered. Consent given for treatment as described below: -No new findings. No new orders. -Toenails 1-5 b/l were debrided in length and girth with sterile nail nippers and dremel without iatrogenic bleeding.  -Patient/POA to call should there be question/concern in the interim.  Return in about 3 months (around 01/10/2022).  Marzetta Board, DPM

## 2021-10-26 ENCOUNTER — Telehealth: Payer: Self-pay | Admitting: Pharmacist

## 2021-10-26 NOTE — Telephone Encounter (Signed)
Pt called clinic reporting swelling in her ankles that has been bothering her for a while. Watching her salt. Moved into independent living in the last week. BP controlled well. Wants to increase her furosemide, currently taking 10mg  daily. Advised she can increase to 20mg  daily for the next days then resume normal dosing of 10mg  daily. She'll call with further concerns.

## 2021-11-04 ENCOUNTER — Other Ambulatory Visit: Payer: Self-pay | Admitting: Cardiology

## 2021-11-06 ENCOUNTER — Other Ambulatory Visit: Payer: Self-pay | Admitting: Cardiology

## 2021-11-07 ENCOUNTER — Other Ambulatory Visit: Payer: Self-pay | Admitting: Pharmacist

## 2021-12-04 ENCOUNTER — Emergency Department (HOSPITAL_BASED_OUTPATIENT_CLINIC_OR_DEPARTMENT_OTHER)
Admission: EM | Admit: 2021-12-04 | Discharge: 2021-12-04 | Disposition: A | Discharge status: Home / Self Care | Payer: Medicare Other | Attending: Emergency Medicine | Admitting: Emergency Medicine

## 2021-12-04 ENCOUNTER — Emergency Department (HOSPITAL_BASED_OUTPATIENT_CLINIC_OR_DEPARTMENT_OTHER): Payer: Medicare Other

## 2021-12-04 ENCOUNTER — Encounter (HOSPITAL_BASED_OUTPATIENT_CLINIC_OR_DEPARTMENT_OTHER): Payer: Self-pay | Admitting: Emergency Medicine

## 2021-12-04 ENCOUNTER — Other Ambulatory Visit: Payer: Self-pay

## 2021-12-04 DIAGNOSIS — Z7984 Long term (current) use of oral hypoglycemic drugs: Secondary | ICD-10-CM | POA: Insufficient documentation

## 2021-12-04 DIAGNOSIS — H6692 Otitis media, unspecified, left ear: Secondary | ICD-10-CM | POA: Insufficient documentation

## 2021-12-04 DIAGNOSIS — I10 Essential (primary) hypertension: Secondary | ICD-10-CM | POA: Diagnosis not present

## 2021-12-04 DIAGNOSIS — Z79899 Other long term (current) drug therapy: Secondary | ICD-10-CM | POA: Insufficient documentation

## 2021-12-04 DIAGNOSIS — Z853 Personal history of malignant neoplasm of breast: Secondary | ICD-10-CM | POA: Insufficient documentation

## 2021-12-04 DIAGNOSIS — Z7982 Long term (current) use of aspirin: Secondary | ICD-10-CM | POA: Insufficient documentation

## 2021-12-04 DIAGNOSIS — I2510 Atherosclerotic heart disease of native coronary artery without angina pectoris: Secondary | ICD-10-CM | POA: Diagnosis not present

## 2021-12-04 DIAGNOSIS — J069 Acute upper respiratory infection, unspecified: Secondary | ICD-10-CM | POA: Diagnosis not present

## 2021-12-04 DIAGNOSIS — H7090 Unspecified mastoiditis, unspecified ear: Secondary | ICD-10-CM | POA: Diagnosis not present

## 2021-12-04 DIAGNOSIS — E119 Type 2 diabetes mellitus without complications: Secondary | ICD-10-CM | POA: Diagnosis not present

## 2021-12-04 DIAGNOSIS — H9202 Otalgia, left ear: Secondary | ICD-10-CM

## 2021-12-04 DIAGNOSIS — Z20822 Contact with and (suspected) exposure to covid-19: Secondary | ICD-10-CM | POA: Insufficient documentation

## 2021-12-04 DIAGNOSIS — H6690 Otitis media, unspecified, unspecified ear: Secondary | ICD-10-CM

## 2021-12-04 LAB — CBC WITH DIFFERENTIAL/PLATELET
Abs Immature Granulocytes: 0.03 10*3/uL (ref 0.00–0.07)
Basophils Absolute: 0 10*3/uL (ref 0.0–0.1)
Basophils Relative: 0 %
Eosinophils Absolute: 0.1 10*3/uL (ref 0.0–0.5)
Eosinophils Relative: 1 %
HCT: 34.4 % — ABNORMAL LOW (ref 36.0–46.0)
Hemoglobin: 11.5 g/dL — ABNORMAL LOW (ref 12.0–15.0)
Immature Granulocytes: 0 %
Lymphocytes Relative: 18 %
Lymphs Abs: 1.5 10*3/uL (ref 0.7–4.0)
MCH: 31.4 pg (ref 26.0–34.0)
MCHC: 33.4 g/dL (ref 30.0–36.0)
MCV: 94 fL (ref 80.0–100.0)
Monocytes Absolute: 0.7 10*3/uL (ref 0.1–1.0)
Monocytes Relative: 8 %
Neutro Abs: 6.1 10*3/uL (ref 1.7–7.7)
Neutrophils Relative %: 73 %
Platelets: 237 10*3/uL (ref 150–400)
RBC: 3.66 MIL/uL — ABNORMAL LOW (ref 3.87–5.11)
RDW: 12.7 % (ref 11.5–15.5)
WBC: 8.5 10*3/uL (ref 4.0–10.5)
nRBC: 0 % (ref 0.0–0.2)

## 2021-12-04 LAB — BASIC METABOLIC PANEL
Anion gap: 8 (ref 5–15)
BUN: 34 mg/dL — ABNORMAL HIGH (ref 8–23)
CO2: 30 mmol/L (ref 22–32)
Calcium: 9.5 mg/dL (ref 8.9–10.3)
Chloride: 95 mmol/L — ABNORMAL LOW (ref 98–111)
Creatinine, Ser: 0.93 mg/dL (ref 0.44–1.00)
GFR, Estimated: 59 mL/min — ABNORMAL LOW (ref >60)
Glucose, Bld: 170 mg/dL — ABNORMAL HIGH (ref 70–99)
Potassium: 3.5 mmol/L (ref 3.5–5.1)
Sodium: 133 mmol/L — ABNORMAL LOW (ref 135–145)

## 2021-12-04 LAB — RESP PANEL BY RT-PCR (FLU A&B, COVID) ARPGX2
Influenza A by PCR: NEGATIVE
Influenza B by PCR: NEGATIVE
SARS Coronavirus 2 by RT PCR: NEGATIVE

## 2021-12-04 MED ORDER — DOXYCYCLINE HYCLATE 100 MG PO CAPS: 100.0000 mg | ORAL_CAPSULE | Freq: Two times a day (BID) | ORAL | 0 refills | Status: DC

## 2021-12-04 MED ORDER — IOHEXOL 300 MG/ML  SOLN
100.0000 mL | Freq: Once | INTRAMUSCULAR | Status: AC | PRN
  Administered 2021-12-04: 12:00:00 75 mL via INTRAVENOUS

## 2021-12-04 NOTE — Discharge Instructions (Addendum)
You were evaluated in the Emergency Department and after careful evaluation, we did not find any emergent condition requiring admission or further testing in the hospital.  Your exam/testing today was overall reassuring.  Your CT did not reveal evidence of mastoiditis.  We will treat for an ear infection. Given your penicillin allergy we will treat with Doxycycline.  Please return to the Emergency Department if you experience any worsening of your condition.  Thank you for allowing [place] to be a part of your care.

## 2021-12-04 NOTE — ED Provider Notes (Signed)
[facility] EMERGENCY DEPT Provider Note   CSN: [id number] Arrival date & time: 12/04/21  1219     History Chief Complaint  Patient presents with   Otalgia   Nasal Congestion    Barbara Russell is a 87 y.o. female.   Otalgia Associated symptoms: congestion   Associated symptoms: no hearing loss    87 year old female presenting to the emergency department with ear pain.  She presents from her independent living facility with ear pain that began on Tuesday.  She has had some nasal congestion.  She denies any hearing loss.  She does endorse some radiation of pain to behind her ear.,  Moderate to severe, radiating to the mastoid, no aggravating or alleviating factors.  She denies any fevers or chills.  She denies any headaches.  She denies any neck pain or stiffness.  Past Medical History:  Diagnosis Date   Aortic heart murmur    aortic sclerosis w/out and significant stenosis   Breast cancer (HCC)    Hx of BC, s/p mastectomy on right 1999, s/p tamoxifen X1 year   Chest pain    EC Stress test 12/21/11 Low risk, no ischemia, normal EF   Coronary artery disease    Remote Hx of CAD, normal stress test 2009   Diabetes mellitus without complication ([place])    Dyspnea    Hypercholesteremia    Hypertension    Lichen sclerosus    Low back pain    Myocardial infarction ([place]) 2000   Osteoarthrosis, unspecified whether generalized or localized, unspecified site    Pain    Several year Hx of pain/ SOB radiating to her neck   SOB (shortness of breath)     Patient Active Problem List   Diagnosis Date Noted   Breast cancer ([place]) 02/02/2021   Osteoarthritis 02/02/2021   Osteopenia 02/02/2021   Hyperlipidemia 03/10/2020   Hypertension 02/23/2020   Osteoarthritis of right knee 08/31/2018   Low back pain 03/07/2018   Coronary artery disease due to lipid rich plaque 11/26/2014   Old MI (myocardial infarction) 04/07/2014   Aortic stenosis 04/07/2014   Type II or  unspecified type diabetes mellitus without mention of complication, not stated as uncontrolled 04/07/2014   Angina decubitus ([place]) 04/07/2014    Past Surgical History:  Procedure Laterality Date   APPENDECTOMY     HAND SURGERY Bilateral    CTS and trigger finger on right and left   MASTECTOMY Right 1999   REPLACEMENT TOTAL KNEE Right 11/2009   VESICOVAGINAL FISTULA CLOSURE W/ TAH  1969     OB History   No obstetric history on file.     Family History  Problem Relation Age of Onset   Cancer Mother        cervical   Heart disease Father     Social History   Tobacco Use   Smoking status: Never   Smokeless tobacco: Never  Vaping Use   Vaping Use: Never used  Substance Use Topics   Alcohol use: No   Drug use: No    Home Medications Prior to Admission medications   Medication Sig Start Date End Date Taking? Authorizing Provider  doxycycline (VIBRAMYCIN) 100 MG capsule Take 1 capsule (100 mg total) by mouth 2 (two) times daily. 12/04/21  Yes Regan Lemming, MD  ALPRAZolam Duanne Moron) 0.25 MG tablet Take 0.125 mg by mouth daily as needed. Patient not taking: Reported on 09/01/2021 12/29/19   [provider]  aspirin 81 MG tablet Take 81 mg  by mouth daily.    [provider]  b complex vitamins tablet Take 1 tablet by mouth daily.    [provider]  cetirizine (ZYRTEC) 10 MG tablet Take 10 mg by mouth daily as needed for allergies.     [provider]  cholecalciferol (VITAMIN D) 1000 UNITS tablet Take 1,000 Units by mouth daily.     [provider]  clindamycin (CLEOCIN) 150 MG capsule  08/10/20   [provider]  diclofenac Sodium (VOLTAREN) 1 % GEL Apply 2 g topically daily as needed.    [provider]  furosemide (LASIX) 20 MG tablet Take 0.5 tablets (10 mg total) by mouth as needed. 07/29/21   Jerline Pain, MD  gabapentin (NEURONTIN) 300 MG capsule Take 600 mg by mouth at bedtime.     [provider]   hydrALAZINE (APRESOLINE) 25 MG tablet TAKE 1 TABLET BY MOUTH THREE TIMES A DAY Patient taking differently: as needed. 04/11/21   Jerline Pain, MD  hydrochlorothiazide (HYDRODIURIL) 25 MG tablet TAKE 1 TABLET BY MOUTH EVERY DAY 11/07/21   Jerline Pain, MD  isosorbide mononitrate (IMDUR) 30 MG 24 hr tablet TAKE 1 TABLET BY MOUTH EVERY DAY 11/07/21   Jerline Pain, MD  losartan (COZAAR) 100 MG tablet Take 1 tablet (100 mg total) by mouth daily. 04/28/21   Jerline Pain, MD  metFORMIN (GLUCOPHAGE) 500 MG tablet Take 500 mg by mouth daily at 12 noon.    [provider]  Multiple Vitamins-Minerals (PRESERVISION AREDS 2 PO) Take by mouth in the morning and at bedtime.    [provider]  nitroGLYCERIN (NITROSTAT) 0.4 MG SL tablet PLACE 1 TABLET (0.4 MG TOTAL) UNDER THE TONGUE EVERY 5 (FIVE) MINUTES AS NEEDED FOR CHEST PAIN. 01/10/21   Jerline Pain, MD  Omega-3 Fatty Acids (FISH OIL) 1200 MG CAPS Take 1,200 mg by mouth daily.    [provider]  ondansetron (ZOFRAN) 4 MG tablet Take 4 mg by mouth as needed.    [provider]  [facility] ULTRA test strip CHECK ONCE DAILY AS DIRECTED 05/29/21   [provider]  zolpidem (AMBIEN) 5 MG tablet Take 5 mg by mouth at bedtime.     [provider]    Allergies    Amlodipine, Benadryl [diphenhydramine hcl (sleep)], Ciprofloxacin, Lactose intolerance (gi), Macrobid [nitrofurantoin monohyd macro], Naproxen, Quinine sulfate [quinine], Rosuvastatin, Sulfa antibiotics, Tramadol, Penicillins, and Prednisone  Review of Systems   Review of Systems  HENT:  Positive for congestion and ear pain. Negative for hearing loss.   All other systems reviewed and are negative.  Physical Exam Updated Vital Signs BP (!) 141/60 (BP Location: Left Arm)    Pulse 64    Temp 98.4 F (36.9 C) (Oral)    Resp 18    Ht 5\' 3"  (1.6 m)    Wt 63 kg    LMP  (LMP Unknown)    SpO2 97%    BMI 24.62 kg/m   Physical Exam Vitals and  nursing note reviewed.  Constitutional:      General: She is not in acute distress.    Appearance: She is well-developed.  HENT:     Head: Normocephalic and atraumatic.     Left Ear: A middle ear effusion is present. There is mastoid tenderness.     Ears:     Comments: Bulging tympanic membrane noted on the left, no evidence of rupture, mild tenderness of the mastoids posteriorly Eyes:  Conjunctiva/sclera: Conjunctivae normal.  Cardiovascular:     Rate and Rhythm: Normal rate and regular rhythm.     Heart sounds: No murmur heard. Pulmonary:     Effort: Pulmonary effort is normal. No respiratory distress.     Breath sounds: Normal breath sounds.  Abdominal:     Palpations: Abdomen is soft.     Tenderness: There is no abdominal tenderness.  Musculoskeletal:        General: No swelling.     Cervical back: Neck supple.  Skin:    General: Skin is warm and dry.     Capillary Refill: Capillary refill takes less than 2 seconds.  Neurological:     Mental Status: She is alert.  Psychiatric:        Mood and Affect: Mood normal.    ED Results / Procedures / Treatments   Labs (all labs ordered are listed, but only abnormal results are displayed) Labs Reviewed  CBC WITH DIFFERENTIAL/PLATELET - Abnormal; Notable for the following components:      Result Value   RBC 3.66 (*)    Hemoglobin 11.5 (*)    HCT 34.4 (*)    All other components within normal limits  BASIC METABOLIC PANEL - Abnormal; Notable for the following components:   Sodium 133 (*)    Chloride 95 (*)    Glucose, Bld 170 (*)    BUN 34 (*)    GFR, Estimated 59 (*)    All other components within normal limits  RESP PANEL BY RT-PCR (FLU A&B, COVID) ARPGX2    EKG None  Radiology CT Temporal Bones W Contrast  Result Date: 12/04/2021 CLINICAL DATA:  Provided history: Mastoiditis. Ear pain began on Tuesday. Nasal congestion. EXAM: CT TEMPORAL BONES WITH CONTRAST TECHNIQUE: Axial and coronal plane CT imaging of the  petrous temporal bones was performed with thin-collimation image reconstruction following intravenous contrast administration. Multiplanar CT image reconstructions were also generated. CONTRAST:  54mL OMNIPAQUE IOHEXOL 300 MG/ML  SOLN COMPARISON:  No pertinent prior exam. FINDINGS: RIGHT TEMPORAL BONE External auditory canal: Moderate amount of debris within the right external auditory canal. Middle ear cavity: Normally aerated. The scutum and ossicles are normal. The tegmen tympani is intact. Inner ear structures: The cochlea, vestibule and semicircular canals are normal. The vestibular aqueduct is not enlarged. Internal auditory and facial nerve canals:  Unremarkable. Mastoid air cells: Normally aerated. No osseous erosion. LEFT TEMPORAL BONE External auditory canal: Patent. Middle ear cavity: Normally aerated. The scutum and ossicles are normal. The tegmen tympani is intact. Inner ear structures: The cochlea, vestibule and semicircular canals are normal. The vestibular aqueduct is not enlarged. Internal auditory and facial nerve canals:  Unremarkable. Mastoid air cells: Normally aerated. No osseous erosion. Vascular: Atherosclerotic calcifications within the intracranial internal carotid arteries. Otherwise unremarkable appearance of the carotid canals, jugular bulbs and sigmoid plates. Limited intracranial: No evidence of acute intracranial mallet within the field of view. Visible orbits/paranasal sinuses: No orbital mass or acute orbital finding. No significant paranasal sinus disease at the imaged levels. Soft tissues: Unremarkable. IMPRESSION: Moderate amount of debris within the right external auditory canal. Otherwise unremarkable CT appearance of the temporal bones. Calcified plaque within the intracranial internal carotid arteries, bilaterally. Electronically Signed   By: Kellie Simmering D.O.   On: 12/04/2021 12:49    Procedures Procedures   Medications Ordered in ED Medications  iohexol (OMNIPAQUE)  300 MG/ML solution 100 mL (75 mLs Intravenous Contrast Given 12/04/21 1217)    ED Course  I have reviewed the triage vital signs and the nursing notes.  Pertinent labs & imaging results that were available during my care of the patient were reviewed by me and considered in my medical decision making (see chart for details).    MDM Rules/Calculators/A&P                          87 year old female presenting to the emergency department with ear pain.  She presents from her independent living facility with ear pain that began on Tuesday.  She has had some nasal congestion.  She denies any hearing loss.  She does endorse some radiation of pain to behind her ear.,  Moderate to severe, radiating to the mastoid, no aggravating or alleviating factors.  She denies any fevers or chills.  She denies any headaches.  She denies any neck pain or stiffness.  On arrival, the patient was afebrile, hemodynamically stable.  Physical exam is consistent with a middle ear effusion concerning for possible otitis media with effusion.  Patient presenting with mild URI symptoms in addition to concern for otitis media with effusion.  Some mastoid tenderness.  Will obtain CT of the temporal bone and screening labs and reassess.  CBC without a leukocytosis, COVID-19 and influenza PCR testing resulted negative.  CT of the temporal bones was performed which was negative for mastoiditis.  We will treat for otitis media.  Patient has an extensive allergy list to multiple antibiotics.  We will treat with doxycycline.  Stable for discharge at this time.   Final Clinical Impression(s) / ED Diagnoses Final diagnoses:  Left ear pain  Upper respiratory tract infection, unspecified type  Acute otitis media, unspecified otitis media type    Rx / DC Orders ED Discharge Orders          Ordered    doxycycline (VIBRAMYCIN) 100 MG capsule  2 times daily        12/04/21 1322             Regan Lemming, MD 12/06/21 1115

## 2021-12-04 NOTE — ED Triage Notes (Signed)
Pt via pov from [facility] with ear pain that began on Tuesday. Pt also reports that she has had nasal congestion as well. Pt alert & oriented, nad noted.

## 2021-12-04 NOTE — ED Notes (Signed)
Pt ambulated to restroom. 

## 2021-12-04 NOTE — ED Notes (Signed)
Pt discharged home after verbalizing understanding of discharge instructions; nad noted. 

## 2021-12-07 DIAGNOSIS — J069 Acute upper respiratory infection, unspecified: Secondary | ICD-10-CM | POA: Diagnosis not present

## 2021-12-15 ENCOUNTER — Other Ambulatory Visit: Payer: Self-pay | Admitting: Internal Medicine

## 2021-12-15 ENCOUNTER — Ambulatory Visit
Admission: RE | Admit: 2021-12-15 | Discharge: 2021-12-15 | Disposition: A | Discharge status: Home / Self Care | Payer: Medicare Other | Source: Ambulatory Visit | Attending: Internal Medicine | Admitting: Internal Medicine

## 2021-12-15 DIAGNOSIS — R059 Cough, unspecified: Secondary | ICD-10-CM

## 2021-12-15 DIAGNOSIS — J069 Acute upper respiratory infection, unspecified: Secondary | ICD-10-CM | POA: Diagnosis not present

## 2021-12-21 ENCOUNTER — Ambulatory Visit: Payer: Medicare Other | Admitting: Podiatry

## 2021-12-23 DIAGNOSIS — A0472 Enterocolitis due to Clostridium difficile, not specified as recurrent: Secondary | ICD-10-CM | POA: Diagnosis not present

## 2021-12-23 DIAGNOSIS — R197 Diarrhea, unspecified: Secondary | ICD-10-CM | POA: Diagnosis not present

## 2022-01-06 ENCOUNTER — Ambulatory Visit
Admission: RE | Admit: 2022-01-06 | Discharge: 2022-01-06 | Disposition: A | Discharge status: Home / Self Care | Payer: Medicare Other | Source: Ambulatory Visit | Attending: Internal Medicine | Admitting: Internal Medicine

## 2022-01-06 ENCOUNTER — Other Ambulatory Visit: Payer: Self-pay | Admitting: Internal Medicine

## 2022-01-06 DIAGNOSIS — M25511 Pain in right shoulder: Secondary | ICD-10-CM

## 2022-01-06 DIAGNOSIS — A0472 Enterocolitis due to Clostridium difficile, not specified as recurrent: Secondary | ICD-10-CM | POA: Diagnosis not present

## 2022-01-06 DIAGNOSIS — Z043 Encounter for examination and observation following other accident: Secondary | ICD-10-CM | POA: Diagnosis not present

## 2022-01-12 DIAGNOSIS — E739 Lactose intolerance, unspecified: Secondary | ICD-10-CM | POA: Diagnosis not present

## 2022-01-12 DIAGNOSIS — Z853 Personal history of malignant neoplasm of breast: Secondary | ICD-10-CM | POA: Diagnosis not present

## 2022-01-12 DIAGNOSIS — Z9181 History of falling: Secondary | ICD-10-CM | POA: Diagnosis not present

## 2022-01-12 DIAGNOSIS — M8580 Other specified disorders of bone density and structure, unspecified site: Secondary | ICD-10-CM | POA: Diagnosis not present

## 2022-01-12 DIAGNOSIS — I252 Old myocardial infarction: Secondary | ICD-10-CM | POA: Diagnosis not present

## 2022-01-12 DIAGNOSIS — Z96659 Presence of unspecified artificial knee joint: Secondary | ICD-10-CM | POA: Diagnosis not present

## 2022-01-12 DIAGNOSIS — M150 Primary generalized (osteo)arthritis: Secondary | ICD-10-CM | POA: Diagnosis not present

## 2022-01-12 DIAGNOSIS — I25119 Atherosclerotic heart disease of native coronary artery with unspecified angina pectoris: Secondary | ICD-10-CM | POA: Diagnosis not present

## 2022-01-12 DIAGNOSIS — E7800 Pure hypercholesterolemia, unspecified: Secondary | ICD-10-CM | POA: Diagnosis not present

## 2022-01-12 DIAGNOSIS — Z87440 Personal history of urinary (tract) infections: Secondary | ICD-10-CM | POA: Diagnosis not present

## 2022-01-12 DIAGNOSIS — M25511 Pain in right shoulder: Secondary | ICD-10-CM | POA: Diagnosis not present

## 2022-01-12 DIAGNOSIS — L900 Lichen sclerosus et atrophicus: Secondary | ICD-10-CM | POA: Diagnosis not present

## 2022-01-12 DIAGNOSIS — I119 Hypertensive heart disease without heart failure: Secondary | ICD-10-CM | POA: Diagnosis not present

## 2022-01-12 DIAGNOSIS — A0472 Enterocolitis due to Clostridium difficile, not specified as recurrent: Secondary | ICD-10-CM | POA: Diagnosis not present

## 2022-01-12 DIAGNOSIS — K589 Irritable bowel syndrome without diarrhea: Secondary | ICD-10-CM | POA: Diagnosis not present

## 2022-01-12 DIAGNOSIS — E119 Type 2 diabetes mellitus without complications: Secondary | ICD-10-CM | POA: Diagnosis not present

## 2022-01-12 DIAGNOSIS — K580 Irritable bowel syndrome with diarrhea: Secondary | ICD-10-CM | POA: Diagnosis not present

## 2022-01-12 DIAGNOSIS — Z7982 Long term (current) use of aspirin: Secondary | ICD-10-CM | POA: Diagnosis not present

## 2022-01-12 DIAGNOSIS — Z792 Long term (current) use of antibiotics: Secondary | ICD-10-CM | POA: Diagnosis not present

## 2022-01-12 DIAGNOSIS — I080 Rheumatic disorders of both mitral and aortic valves: Secondary | ICD-10-CM | POA: Diagnosis not present

## 2022-01-12 DIAGNOSIS — Z87891 Personal history of nicotine dependence: Secondary | ICD-10-CM | POA: Diagnosis not present

## 2022-01-13 DIAGNOSIS — Z96651 Presence of right artificial knee joint: Secondary | ICD-10-CM | POA: Diagnosis not present

## 2022-01-14 ENCOUNTER — Other Ambulatory Visit: Payer: Self-pay | Admitting: Cardiology

## 2022-01-18 DIAGNOSIS — I25119 Atherosclerotic heart disease of native coronary artery with unspecified angina pectoris: Secondary | ICD-10-CM | POA: Diagnosis not present

## 2022-01-18 DIAGNOSIS — E119 Type 2 diabetes mellitus without complications: Secondary | ICD-10-CM | POA: Diagnosis not present

## 2022-01-18 DIAGNOSIS — M25511 Pain in right shoulder: Secondary | ICD-10-CM | POA: Diagnosis not present

## 2022-01-18 DIAGNOSIS — I119 Hypertensive heart disease without heart failure: Secondary | ICD-10-CM | POA: Diagnosis not present

## 2022-01-18 DIAGNOSIS — M150 Primary generalized (osteo)arthritis: Secondary | ICD-10-CM | POA: Diagnosis not present

## 2022-01-18 DIAGNOSIS — A0472 Enterocolitis due to Clostridium difficile, not specified as recurrent: Secondary | ICD-10-CM | POA: Diagnosis not present

## 2022-01-20 ENCOUNTER — Other Ambulatory Visit (HOSPITAL_COMMUNITY): Payer: Self-pay | Admitting: Orthopedic Surgery

## 2022-01-20 ENCOUNTER — Other Ambulatory Visit: Payer: Self-pay | Admitting: Orthopedic Surgery

## 2022-01-20 DIAGNOSIS — Z96651 Presence of right artificial knee joint: Secondary | ICD-10-CM | POA: Diagnosis not present

## 2022-01-25 ENCOUNTER — Encounter: Payer: Self-pay | Admitting: Podiatry

## 2022-01-25 ENCOUNTER — Ambulatory Visit (INDEPENDENT_AMBULATORY_CARE_PROVIDER_SITE_OTHER): Payer: Medicare Other | Admitting: Podiatry

## 2022-01-25 ENCOUNTER — Other Ambulatory Visit: Payer: Self-pay

## 2022-01-25 DIAGNOSIS — E119 Type 2 diabetes mellitus without complications: Secondary | ICD-10-CM

## 2022-01-25 DIAGNOSIS — I119 Hypertensive heart disease without heart failure: Secondary | ICD-10-CM | POA: Diagnosis not present

## 2022-01-25 DIAGNOSIS — M79674 Pain in right toe(s): Secondary | ICD-10-CM

## 2022-01-25 DIAGNOSIS — A0472 Enterocolitis due to Clostridium difficile, not specified as recurrent: Secondary | ICD-10-CM | POA: Diagnosis not present

## 2022-01-25 DIAGNOSIS — M25511 Pain in right shoulder: Secondary | ICD-10-CM | POA: Diagnosis not present

## 2022-01-25 DIAGNOSIS — M79675 Pain in left toe(s): Secondary | ICD-10-CM | POA: Diagnosis not present

## 2022-01-25 DIAGNOSIS — I25119 Atherosclerotic heart disease of native coronary artery with unspecified angina pectoris: Secondary | ICD-10-CM | POA: Diagnosis not present

## 2022-01-25 DIAGNOSIS — B351 Tinea unguium: Secondary | ICD-10-CM

## 2022-01-25 DIAGNOSIS — M150 Primary generalized (osteo)arthritis: Secondary | ICD-10-CM | POA: Diagnosis not present

## 2022-01-26 DIAGNOSIS — M150 Primary generalized (osteo)arthritis: Secondary | ICD-10-CM | POA: Diagnosis not present

## 2022-01-26 DIAGNOSIS — A0472 Enterocolitis due to Clostridium difficile, not specified as recurrent: Secondary | ICD-10-CM | POA: Diagnosis not present

## 2022-01-26 DIAGNOSIS — E119 Type 2 diabetes mellitus without complications: Secondary | ICD-10-CM | POA: Diagnosis not present

## 2022-01-26 DIAGNOSIS — M25511 Pain in right shoulder: Secondary | ICD-10-CM | POA: Diagnosis not present

## 2022-01-26 DIAGNOSIS — I119 Hypertensive heart disease without heart failure: Secondary | ICD-10-CM | POA: Diagnosis not present

## 2022-01-26 DIAGNOSIS — I25119 Atherosclerotic heart disease of native coronary artery with unspecified angina pectoris: Secondary | ICD-10-CM | POA: Diagnosis not present

## 2022-01-30 NOTE — Progress Notes (Addendum)
ANNUAL DIABETIC FOOT EXAM  Subjective: Antonella F Trussell presents today for for annual diabetic foot examination.  Patient relates dx of diabetes.  Patient denies any h/o foot wounds.  Patient has been diagnosed with neuropathy and it is managed with gabapentin.  Patient's blood sugar was 126 mg/dl today.   Risk factors: diabetes, diabetic neuropathy, h/o MI, HTN.  Seward Carol, MD is patient's PCP. Last visit was January 06, 2022.  Past Medical History:  Diagnosis Date   Aortic heart murmur    aortic sclerosis w/out and significant stenosis   Breast cancer (HCC)    Hx of BC, s/p mastectomy on right 1999, s/p tamoxifen X1 year   Chest pain    EC Stress test 12/21/11 Low risk, no ischemia, normal EF   Coronary artery disease    Remote Hx of CAD, normal stress test 2009   Diabetes mellitus without complication ([place])    Dyspnea    Hypercholesteremia    Hypertension    Lichen sclerosus    Low back pain    Myocardial infarction ([place]) 2000   Osteoarthrosis, unspecified whether generalized or localized, unspecified site    Pain    Several year Hx of pain/ SOB radiating to her neck   SOB (shortness of breath)    Patient Active Problem List   Diagnosis Date Noted   Breast cancer ([place]) 02/02/2021   Osteoarthritis 02/02/2021   Osteopenia 02/02/2021   Hyperlipidemia 03/10/2020   Hypertension 02/23/2020   Osteoarthritis of right knee 08/31/2018   Low back pain 03/07/2018   Coronary artery disease due to lipid rich plaque 11/26/2014   Old MI (myocardial infarction) 04/07/2014   Aortic stenosis 04/07/2014   Diabetes ([place]) 04/07/2014   Angina decubitus ([place]) 04/07/2014   Past Surgical History:  Procedure Laterality Date   APPENDECTOMY     HAND SURGERY Bilateral    CTS and trigger finger on right and left   MASTECTOMY Right 1999   REPLACEMENT TOTAL KNEE Right 11/2009   VESICOVAGINAL FISTULA CLOSURE W/ TAH  1969   Current Outpatient Medications on File Prior to Visit   Medication Sig Dispense Refill   saccharomyces boulardii (FLORASTOR) 250 MG capsule See admin instructions.     vancomycin (VANCOCIN) 125 MG capsule 1 capsule     ALPRAZolam (XANAX) 0.25 MG tablet Take 0.125 mg by mouth daily as needed. (Patient not taking: Reported on 09/01/2021)     aspirin 81 MG tablet Take 81 mg by mouth daily.     b complex vitamins tablet Take 1 tablet by mouth daily.     cetirizine (ZYRTEC) 10 MG tablet Take 10 mg by mouth daily as needed for allergies.      cholecalciferol (VITAMIN D) 1000 UNITS tablet Take 1,000 Units by mouth daily.      clindamycin (CLEOCIN) 150 MG capsule      clobetasol ointment (TEMOVATE) 0.05 % SMARTSIG:Sparingly Topical Twice Daily     clobetasol ointment (TEMOVATE) 0.05 % clobetasol 0.05 % topical ointment  APPLY THIN COAT TO AFFECTED AREA TWICE A DAY     diclofenac Sodium (VOLTAREN) 1 % GEL Apply 2 g topically daily as needed.     doxycycline (VIBRAMYCIN) 100 MG capsule Take 1 capsule (100 mg total) by mouth 2 (two) times daily. 20 capsule 0   furosemide (LASIX) 20 MG tablet TAKE 1/2 TABLET BY MOUTH AS NEEDED 45 tablet 2   furosemide (LASIX) 20 MG tablet 1 tablet     gabapentin (NEURONTIN) 300 MG capsule Take  600 mg by mouth at bedtime.      hydrALAZINE (APRESOLINE) 25 MG tablet TAKE 1 TABLET BY MOUTH THREE TIMES A DAY (Patient taking differently: as needed.) 270 tablet 1   hydrochlorothiazide (HYDRODIURIL) 25 MG tablet TAKE 1 TABLET BY MOUTH EVERY DAY 90 tablet 2   isosorbide mononitrate (IMDUR) 30 MG 24 hr tablet TAKE 1 TABLET BY MOUTH EVERY DAY 90 tablet 2   losartan (COZAAR) 100 MG tablet Take 1 tablet (100 mg total) by mouth daily. 90 tablet 1   metFORMIN (GLUCOPHAGE) 500 MG tablet Take 500 mg by mouth daily at 12 noon.     Multiple Vitamins-Minerals (PRESERVISION AREDS 2 PO) Take by mouth in the morning and at bedtime.     nitroGLYCERIN (NITROSTAT) 0.4 MG SL tablet PLACE 1 TABLET (0.4 MG TOTAL) UNDER THE TONGUE EVERY 5 (FIVE) MINUTES  AS NEEDED FOR CHEST PAIN. 25 tablet 1   Omega-3 Fatty Acids (FISH OIL) 1200 MG CAPS Take 1,200 mg by mouth daily.     ondansetron (ZOFRAN) 4 MG tablet Take 4 mg by mouth as needed.     ONETOUCH ULTRA test strip CHECK ONCE DAILY AS DIRECTED     zolpidem (AMBIEN) 5 MG tablet Take 5 mg by mouth at bedtime.      No current facility-administered medications on file prior to visit.    Allergies  Allergen Reactions   Amlodipine     LEE with 5mg  dosing   Benadryl [Diphenhydramine Hcl (Sleep)] Other (See Comments)    Hyperstimulation   Ciprofloxacin Hives   Lactose Intolerance (Gi) Diarrhea   Macrobid [Nitrofurantoin Monohyd Macro] Nausea And Vomiting   Naproxen Other (See Comments)    Stomach pain   Quinine Sulfate [Quinine] Hives   Rosuvastatin     headache   Sulfa Antibiotics Hives        Tramadol Other (See Comments)    unknown to patient   Penicillins Rash    Has patient had a PCN reaction causing immediate rash, facial/tongue/throat swelling, SOB or lightheadedness with hypotension: Yes Has patient had a PCN reaction causing severe rash involving mucus membranes or skin necrosis: No Has patient had a PCN reaction that required hospitalization: No Has patient had a PCN reaction occurring within the last 10 years: No If all of the above answers are "NO", then may proceed with Cephalosporin use.    Prednisone Other (See Comments)    Weakness and pain in joints   Social History   Occupational History   Not on file  Tobacco Use   Smoking status: Never   Smokeless tobacco: Never  Vaping Use   Vaping Use: Never used  Substance and Sexual Activity   Alcohol use: No   Drug use: No   Sexual activity: Not on file   Family History  Problem Relation Age of Onset   Cancer Mother        cervical   Heart disease Father    Immunization History  Administered Date(s) Administered   Fluad Quad(high Dose 65+) 08/10/2019   Influenza, High Dose Seasonal PF 08/31/2017    Influenza-Unspecified 09/10/2014     Review of Systems: Negative except as noted in the HPI.   Objective: There were no vitals filed for this visit.  Aleeha F Zaring is a pleasant 88 y.o. female in NAD. AAO X 3.  Vascular Examination: Capillary refill time to digits immediate b/l. Faintly palpable pedal pulses b/l. Pedal hair absent. No pain with calf compression b/l. Lower extremity skin  temperature gradient within normal limits. No edema noted b/l LE. No cyanosis or clubbing noted b/l LE.  Dermatological Examination: Pedal integument with normal turgor, texture and tone BLE. No open wounds b/l LE. No interdigital macerations noted b/l LE. Toenails 1-5 b/l elongated, discolored, dystrophic, thickened, crumbly with subungual debris and tenderness to dorsal palpation. No hyperkeratotic nor porokeratotic lesions present on today's visit.  Musculoskeletal Examination: Muscle strength 5/5 to all lower extremity muscle groups bilaterally. No pain, crepitus or joint limitation noted with ROM bilateral LE. No gross bony deformities bilaterally.  Footwear Assessment: Does the patient wear appropriate shoes? Yes. Does the patient need inserts/orthotics? No.  Neurological Examination: Protective sensation intact 5/5 intact bilaterally with 10g monofilament b/l. Vibratory sensation intact b/l.  Assessment: 1. Pain due to onychomycosis of toenails of both feet   2. Type 2 diabetes mellitus without complication, without long-term current use of insulin ([place])   3. Encounter for diabetic foot exam ([place])      ADA Risk Categorization: Low Risk :  Patient has all of the following: Intact protective sensation No prior foot ulcer  No severe deformity Pedal pulses present  Plan: -Diabetic foot examination performed today. -Continue foot and shoe inspections daily. Monitor blood glucose per PCP/Endocrinologist's recommendations. -Mycotic toenails 1-5 bilaterally were debrided in length and  girth with sterile nail nippers and dremel without incident. -Patient/POA to call should there be question/concern in the interim.  Return in about 3 months (around 04/24/2022).  Marzetta Board, DPM

## 2022-01-31 ENCOUNTER — Encounter (HOSPITAL_COMMUNITY)
Admission: RE | Admit: 2022-01-31 | Discharge: 2022-01-31 | Disposition: A | Discharge status: Home / Self Care | Payer: Medicare Other | Source: Ambulatory Visit | Attending: Orthopedic Surgery | Admitting: Orthopedic Surgery

## 2022-01-31 ENCOUNTER — Other Ambulatory Visit: Payer: Self-pay

## 2022-01-31 DIAGNOSIS — M1712 Unilateral primary osteoarthritis, left knee: Secondary | ICD-10-CM | POA: Diagnosis not present

## 2022-01-31 DIAGNOSIS — Z471 Aftercare following joint replacement surgery: Secondary | ICD-10-CM | POA: Diagnosis not present

## 2022-01-31 DIAGNOSIS — Z96651 Presence of right artificial knee joint: Secondary | ICD-10-CM | POA: Insufficient documentation

## 2022-01-31 MED ORDER — TECHNETIUM TC 99M MEDRONATE IV KIT
20.0000 | PACK | Freq: Once | INTRAVENOUS | Status: AC | PRN
  Administered 2022-01-31: 20 via INTRAVENOUS

## 2022-02-01 DIAGNOSIS — A0472 Enterocolitis due to Clostridium difficile, not specified as recurrent: Secondary | ICD-10-CM | POA: Diagnosis not present

## 2022-02-01 DIAGNOSIS — I119 Hypertensive heart disease without heart failure: Secondary | ICD-10-CM | POA: Diagnosis not present

## 2022-02-01 DIAGNOSIS — M150 Primary generalized (osteo)arthritis: Secondary | ICD-10-CM | POA: Diagnosis not present

## 2022-02-01 DIAGNOSIS — M25511 Pain in right shoulder: Secondary | ICD-10-CM | POA: Diagnosis not present

## 2022-02-01 DIAGNOSIS — E119 Type 2 diabetes mellitus without complications: Secondary | ICD-10-CM | POA: Diagnosis not present

## 2022-02-01 DIAGNOSIS — I25119 Atherosclerotic heart disease of native coronary artery with unspecified angina pectoris: Secondary | ICD-10-CM | POA: Diagnosis not present

## 2022-02-02 DIAGNOSIS — M25511 Pain in right shoulder: Secondary | ICD-10-CM | POA: Diagnosis not present

## 2022-02-02 DIAGNOSIS — A0472 Enterocolitis due to Clostridium difficile, not specified as recurrent: Secondary | ICD-10-CM | POA: Diagnosis not present

## 2022-02-02 DIAGNOSIS — I119 Hypertensive heart disease without heart failure: Secondary | ICD-10-CM | POA: Diagnosis not present

## 2022-02-02 DIAGNOSIS — M150 Primary generalized (osteo)arthritis: Secondary | ICD-10-CM | POA: Diagnosis not present

## 2022-02-02 DIAGNOSIS — I25119 Atherosclerotic heart disease of native coronary artery with unspecified angina pectoris: Secondary | ICD-10-CM | POA: Diagnosis not present

## 2022-02-02 DIAGNOSIS — E119 Type 2 diabetes mellitus without complications: Secondary | ICD-10-CM | POA: Diagnosis not present

## 2022-02-07 DIAGNOSIS — M150 Primary generalized (osteo)arthritis: Secondary | ICD-10-CM | POA: Diagnosis not present

## 2022-02-07 DIAGNOSIS — A0472 Enterocolitis due to Clostridium difficile, not specified as recurrent: Secondary | ICD-10-CM | POA: Diagnosis not present

## 2022-02-07 DIAGNOSIS — M25511 Pain in right shoulder: Secondary | ICD-10-CM | POA: Diagnosis not present

## 2022-02-07 DIAGNOSIS — E119 Type 2 diabetes mellitus without complications: Secondary | ICD-10-CM | POA: Diagnosis not present

## 2022-02-07 DIAGNOSIS — I119 Hypertensive heart disease without heart failure: Secondary | ICD-10-CM | POA: Diagnosis not present

## 2022-02-07 DIAGNOSIS — I25119 Atherosclerotic heart disease of native coronary artery with unspecified angina pectoris: Secondary | ICD-10-CM | POA: Diagnosis not present

## 2022-02-09 DIAGNOSIS — A0472 Enterocolitis due to Clostridium difficile, not specified as recurrent: Secondary | ICD-10-CM | POA: Diagnosis not present

## 2022-02-09 DIAGNOSIS — M25511 Pain in right shoulder: Secondary | ICD-10-CM | POA: Diagnosis not present

## 2022-02-09 DIAGNOSIS — E119 Type 2 diabetes mellitus without complications: Secondary | ICD-10-CM | POA: Diagnosis not present

## 2022-02-09 DIAGNOSIS — M150 Primary generalized (osteo)arthritis: Secondary | ICD-10-CM | POA: Diagnosis not present

## 2022-02-09 DIAGNOSIS — I119 Hypertensive heart disease without heart failure: Secondary | ICD-10-CM | POA: Diagnosis not present

## 2022-02-09 DIAGNOSIS — I25119 Atherosclerotic heart disease of native coronary artery with unspecified angina pectoris: Secondary | ICD-10-CM | POA: Diagnosis not present

## 2022-02-11 DIAGNOSIS — M25511 Pain in right shoulder: Secondary | ICD-10-CM | POA: Diagnosis not present

## 2022-02-11 DIAGNOSIS — E7800 Pure hypercholesterolemia, unspecified: Secondary | ICD-10-CM | POA: Diagnosis not present

## 2022-02-11 DIAGNOSIS — M8580 Other specified disorders of bone density and structure, unspecified site: Secondary | ICD-10-CM | POA: Diagnosis not present

## 2022-02-11 DIAGNOSIS — E119 Type 2 diabetes mellitus without complications: Secondary | ICD-10-CM | POA: Diagnosis not present

## 2022-02-11 DIAGNOSIS — Z7982 Long term (current) use of aspirin: Secondary | ICD-10-CM | POA: Diagnosis not present

## 2022-02-11 DIAGNOSIS — I25119 Atherosclerotic heart disease of native coronary artery with unspecified angina pectoris: Secondary | ICD-10-CM | POA: Diagnosis not present

## 2022-02-11 DIAGNOSIS — I080 Rheumatic disorders of both mitral and aortic valves: Secondary | ICD-10-CM | POA: Diagnosis not present

## 2022-02-11 DIAGNOSIS — K580 Irritable bowel syndrome with diarrhea: Secondary | ICD-10-CM | POA: Diagnosis not present

## 2022-02-11 DIAGNOSIS — M150 Primary generalized (osteo)arthritis: Secondary | ICD-10-CM | POA: Diagnosis not present

## 2022-02-11 DIAGNOSIS — A0472 Enterocolitis due to Clostridium difficile, not specified as recurrent: Secondary | ICD-10-CM | POA: Diagnosis not present

## 2022-02-11 DIAGNOSIS — Z87891 Personal history of nicotine dependence: Secondary | ICD-10-CM | POA: Diagnosis not present

## 2022-02-11 DIAGNOSIS — E739 Lactose intolerance, unspecified: Secondary | ICD-10-CM | POA: Diagnosis not present

## 2022-02-11 DIAGNOSIS — I252 Old myocardial infarction: Secondary | ICD-10-CM | POA: Diagnosis not present

## 2022-02-11 DIAGNOSIS — L900 Lichen sclerosus et atrophicus: Secondary | ICD-10-CM | POA: Diagnosis not present

## 2022-02-11 DIAGNOSIS — Z96659 Presence of unspecified artificial knee joint: Secondary | ICD-10-CM | POA: Diagnosis not present

## 2022-02-11 DIAGNOSIS — K589 Irritable bowel syndrome without diarrhea: Secondary | ICD-10-CM | POA: Diagnosis not present

## 2022-02-11 DIAGNOSIS — Z9181 History of falling: Secondary | ICD-10-CM | POA: Diagnosis not present

## 2022-02-11 DIAGNOSIS — Z853 Personal history of malignant neoplasm of breast: Secondary | ICD-10-CM | POA: Diagnosis not present

## 2022-02-11 DIAGNOSIS — Z87440 Personal history of urinary (tract) infections: Secondary | ICD-10-CM | POA: Diagnosis not present

## 2022-02-11 DIAGNOSIS — Z792 Long term (current) use of antibiotics: Secondary | ICD-10-CM | POA: Diagnosis not present

## 2022-02-11 DIAGNOSIS — I119 Hypertensive heart disease without heart failure: Secondary | ICD-10-CM | POA: Diagnosis not present

## 2022-02-14 ENCOUNTER — Telehealth: Payer: Self-pay

## 2022-02-14 NOTE — Telephone Encounter (Signed)
Pt has been scheduled to see Dr. Marlou Porch, 04/18/2022, clearance will be addressed at that time. ?Pt did advise they may move her surgery up if they have a cancellation, if so, surgeon's office can call and we can try to get pt in sooner for clearance. ? ?Will route back to the requesting surgeon's office to make them aware.  ?

## 2022-02-14 NOTE — Telephone Encounter (Signed)
Primary [place], MD ? ?Chart reviewed as part of pre-operative protocol coverage. Because of Len F Konitzer's past medical history and time since last visit, he/she will require a follow-up visit in order to better assess preoperative cardiovascular risk. ? ?Pre-op covering staff: ?- Please schedule appointment and call patient to inform them. Would recommend appointment close to date of surgery ?- Please contact requesting surgeon's office via preferred method (i.e, phone, fax) to inform them of need for appointment prior to surgery. ? ?If applicable, this message will also be routed to pharmacy pool and/or primary cardiologist for input on holding anticoagulant/antiplatelet agent as requested below so that this information is available at time of patient's appointment.  ? ?Emmaline Life, NP-C ? ?  ?02/14/2022, 12:55 PM ?[facility] ?[address], [address] ?Office [phone] Fax [phone] ? ?

## 2022-02-14 NOTE — Telephone Encounter (Signed)
? ?  Pre-operative Risk Assessment  ?  ?Patient Name: Barbara Russell  ?DOB: [birth year] ?MRN: [id number]  ? ?  ? ?Request for Surgical Clearance ?  ? ?Procedure:   Right Knee  Polyethylene vs Total Knee Arthroplasty Revision ? ?Date of Surgery:  Clearance 05/31/22                              ?  ?Surgeon:  Gaynelle Arabian, MD ?Surgeon's Group or Practice Name:  [facility]  ?Phone number:  [phone] [place]) ?Fax number:  [phone] ? ?Type of Clearance Requested:   ?- Medical  ? ?Type of Anesthesia:   Choice ? ?Additional requests/questions:   N/A ? ?Signed, ?Ulice Brilliant T   ?02/14/2022, 12:38 PM  ? ?

## 2022-02-15 DIAGNOSIS — M150 Primary generalized (osteo)arthritis: Secondary | ICD-10-CM | POA: Diagnosis not present

## 2022-02-15 DIAGNOSIS — E119 Type 2 diabetes mellitus without complications: Secondary | ICD-10-CM | POA: Diagnosis not present

## 2022-02-15 DIAGNOSIS — I119 Hypertensive heart disease without heart failure: Secondary | ICD-10-CM | POA: Diagnosis not present

## 2022-02-15 DIAGNOSIS — I25119 Atherosclerotic heart disease of native coronary artery with unspecified angina pectoris: Secondary | ICD-10-CM | POA: Diagnosis not present

## 2022-02-15 DIAGNOSIS — A0472 Enterocolitis due to Clostridium difficile, not specified as recurrent: Secondary | ICD-10-CM | POA: Diagnosis not present

## 2022-02-15 DIAGNOSIS — M25511 Pain in right shoulder: Secondary | ICD-10-CM | POA: Diagnosis not present

## 2022-02-17 DIAGNOSIS — R197 Diarrhea, unspecified: Secondary | ICD-10-CM | POA: Diagnosis not present

## 2022-02-17 DIAGNOSIS — A0472 Enterocolitis due to Clostridium difficile, not specified as recurrent: Secondary | ICD-10-CM | POA: Diagnosis not present

## 2022-02-18 ENCOUNTER — Other Ambulatory Visit: Payer: Self-pay | Admitting: Cardiology

## 2022-02-21 ENCOUNTER — Other Ambulatory Visit: Payer: Self-pay | Admitting: Cardiology

## 2022-02-23 ENCOUNTER — Telehealth: Payer: Self-pay | Admitting: Pharmacist

## 2022-02-23 MED ORDER — LOSARTAN POTASSIUM 50 MG PO TABS: 50.0000 mg | ORAL_TABLET | Freq: Two times a day (BID) | ORAL | 3 refills | Status: DC

## 2022-02-23 NOTE — Telephone Encounter (Signed)
Wants rx for losartan '50mg'$  BID because she takes '50mg'$  in the AM and then sometimes takes an extra '50mg'$  later in the AM if BP is high. Currently has the '100mg'$  tablets and doesn't like splitting them. ? ?Wants to take more IBU because her knee pain is worse and she cant have her knee surgery until June 23. She wants to know if she can take a total of '600mg'$  of IBU per day ?I advised that would be ok in the short term. BP is well under control currently and she is aware of bleed/kidney risk. Does not want to take heavy pain medications. ?

## 2022-02-24 DIAGNOSIS — A0472 Enterocolitis due to Clostridium difficile, not specified as recurrent: Secondary | ICD-10-CM | POA: Diagnosis not present

## 2022-02-24 DIAGNOSIS — I25119 Atherosclerotic heart disease of native coronary artery with unspecified angina pectoris: Secondary | ICD-10-CM | POA: Diagnosis not present

## 2022-02-24 DIAGNOSIS — M150 Primary generalized (osteo)arthritis: Secondary | ICD-10-CM | POA: Diagnosis not present

## 2022-02-24 DIAGNOSIS — M25511 Pain in right shoulder: Secondary | ICD-10-CM | POA: Diagnosis not present

## 2022-02-24 DIAGNOSIS — E119 Type 2 diabetes mellitus without complications: Secondary | ICD-10-CM | POA: Diagnosis not present

## 2022-02-24 DIAGNOSIS — I119 Hypertensive heart disease without heart failure: Secondary | ICD-10-CM | POA: Diagnosis not present

## 2022-02-28 DIAGNOSIS — M25511 Pain in right shoulder: Secondary | ICD-10-CM | POA: Diagnosis not present

## 2022-02-28 DIAGNOSIS — I25119 Atherosclerotic heart disease of native coronary artery with unspecified angina pectoris: Secondary | ICD-10-CM | POA: Diagnosis not present

## 2022-02-28 DIAGNOSIS — A0472 Enterocolitis due to Clostridium difficile, not specified as recurrent: Secondary | ICD-10-CM | POA: Diagnosis not present

## 2022-02-28 DIAGNOSIS — E119 Type 2 diabetes mellitus without complications: Secondary | ICD-10-CM | POA: Diagnosis not present

## 2022-02-28 DIAGNOSIS — I119 Hypertensive heart disease without heart failure: Secondary | ICD-10-CM | POA: Diagnosis not present

## 2022-02-28 DIAGNOSIS — M150 Primary generalized (osteo)arthritis: Secondary | ICD-10-CM | POA: Diagnosis not present

## 2022-03-07 DIAGNOSIS — A0472 Enterocolitis due to Clostridium difficile, not specified as recurrent: Secondary | ICD-10-CM | POA: Diagnosis not present

## 2022-03-07 DIAGNOSIS — I25119 Atherosclerotic heart disease of native coronary artery with unspecified angina pectoris: Secondary | ICD-10-CM | POA: Diagnosis not present

## 2022-03-07 DIAGNOSIS — M150 Primary generalized (osteo)arthritis: Secondary | ICD-10-CM | POA: Diagnosis not present

## 2022-03-07 DIAGNOSIS — M25511 Pain in right shoulder: Secondary | ICD-10-CM | POA: Diagnosis not present

## 2022-03-07 DIAGNOSIS — I119 Hypertensive heart disease without heart failure: Secondary | ICD-10-CM | POA: Diagnosis not present

## 2022-03-07 DIAGNOSIS — E119 Type 2 diabetes mellitus without complications: Secondary | ICD-10-CM | POA: Diagnosis not present

## 2022-03-09 DIAGNOSIS — R399 Unspecified symptoms and signs involving the genitourinary system: Secondary | ICD-10-CM | POA: Diagnosis not present

## 2022-03-18 ENCOUNTER — Encounter (HOSPITAL_BASED_OUTPATIENT_CLINIC_OR_DEPARTMENT_OTHER): Payer: Self-pay | Admitting: Emergency Medicine

## 2022-03-18 ENCOUNTER — Emergency Department (HOSPITAL_BASED_OUTPATIENT_CLINIC_OR_DEPARTMENT_OTHER)
Admission: EM | Admit: 2022-03-18 | Discharge: 2022-03-18 | Disposition: A | Discharge status: Home / Self Care | Payer: Medicare Other | Attending: Emergency Medicine | Admitting: Emergency Medicine

## 2022-03-18 ENCOUNTER — Other Ambulatory Visit: Payer: Self-pay

## 2022-03-18 DIAGNOSIS — N3001 Acute cystitis with hematuria: Secondary | ICD-10-CM | POA: Diagnosis not present

## 2022-03-18 DIAGNOSIS — R300 Dysuria: Secondary | ICD-10-CM | POA: Diagnosis present

## 2022-03-18 DIAGNOSIS — Z7982 Long term (current) use of aspirin: Secondary | ICD-10-CM | POA: Diagnosis not present

## 2022-03-18 LAB — URINALYSIS, ROUTINE W REFLEX MICROSCOPIC
Bilirubin Urine: NEGATIVE
Glucose, UA: NEGATIVE mg/dL
Ketones, ur: NEGATIVE mg/dL
Nitrite: POSITIVE — AB
Protein, ur: 30 mg/dL — AB
Specific Gravity, Urine: 1.013 (ref 1.005–1.030)
WBC, UA: >50 WBC/hpf — ABNORMAL HIGH (ref 0–5)
pH: 7 (ref 5.0–8.0)

## 2022-03-18 MED ORDER — CEFUROXIME AXETIL 500 MG PO TABS: 500.0000 mg | ORAL_TABLET | Freq: Two times a day (BID) | ORAL | 0 refills | Status: AC

## 2022-03-18 MED ORDER — CEFUROXIME AXETIL 500 MG PO TABS: 500.0000 mg | ORAL_TABLET | Freq: Two times a day (BID) | ORAL | 0 refills | Status: DC

## 2022-03-18 NOTE — ED Provider Notes (Signed)
?[facility] EMERGENCY DEPT ?Provider Note ? ? ?CSN: [id number] ?Arrival date & time: 03/18/22  2671 ? ?  ? ?History ? ?No chief complaint on file. ? ? ?Barbara Russell is a 88 y.o. female who presents to the ED complaining of dysuria onset 5 days. She notes that she was treated for with cefuroxime 500 mg twice daily for 5 days.  Her last dose of this medication was 5 days ago.  Patient notes that she does not have a urologist.  She notes past medical history of recurrent UTIs over 13 years ago however has not had a UTI since last week.  Patient has associated dysuria, frequency, urgency.  She has tried cranberry supplements and Aleve for symptoms.  She denies fever, chills, hematuria, abdominal pain, nausea, vomiting. ? ? ?The history is provided by the patient. No language interpreter was used.  ? ?  ? ?Home Medications ?Prior to Admission medications   ?Medication Sig Start Date End Date Taking? Authorizing Provider  ?ALPRAZolam (XANAX) 0.25 MG tablet Take 0.125 mg by mouth daily as needed. ?Patient not taking: Reported on 09/01/2021 12/29/19   [provider]  ?aspirin 81 MG tablet Take 81 mg by mouth daily.    [provider]  ?b complex vitamins tablet Take 1 tablet by mouth daily.    [provider]  ?cefUROXime (CEFTIN) 500 MG tablet Take 1 tablet (500 mg total) by mouth 2 (two) times daily with a meal for 5 days. 03/18/22 03/23/22  Teria Khachatryan A, PA-C  ?cetirizine (ZYRTEC) 10 MG tablet Take 10 mg by mouth daily as needed for allergies.     [provider]  ?cholecalciferol (VITAMIN D) 1000 UNITS tablet Take 1,000 Units by mouth daily.     [provider]  ?clindamycin (CLEOCIN) 150 MG capsule  08/10/20   [provider]  ?clobetasol ointment (TEMOVATE) 0.05 % SMARTSIG:Sparingly Topical Twice Daily 09/07/21   [provider]  ?clobetasol ointment (TEMOVATE) 0.05 % clobetasol 0.05 % topical ointment ? APPLY THIN COAT TO AFFECTED AREA TWICE  A DAY    [provider]  ?diclofenac Sodium (VOLTAREN) 1 % GEL Apply 2 g topically daily as needed.    [provider]  ?doxycycline (VIBRAMYCIN) 100 MG capsule Take 1 capsule (100 mg total) by mouth 2 (two) times daily. 12/04/21   Regan Lemming, MD  ?furosemide (LASIX) 20 MG tablet TAKE 1/2 TABLET BY MOUTH AS NEEDED 01/16/22   Jerline Pain, MD  ?furosemide (LASIX) 20 MG tablet 1 tablet    [provider]  ?gabapentin (NEURONTIN) 300 MG capsule Take 600 mg by mouth at bedtime.     [provider]  ?hydrALAZINE (APRESOLINE) 25 MG tablet TAKE 1 TABLET BY MOUTH THREE TIMES A DAY ?Patient taking differently: as needed. 04/11/21   Jerline Pain, MD  ?hydrochlorothiazide (HYDRODIURIL) 25 MG tablet TAKE 1 TABLET BY MOUTH EVERY DAY 11/07/21   Jerline Pain, MD  ?isosorbide mononitrate (IMDUR) 30 MG 24 hr tablet TAKE 1 TABLET BY MOUTH EVERY DAY 11/07/21   Jerline Pain, MD  ?losartan (COZAAR) 50 MG tablet Take 1 tablet (50 mg total) by mouth 2 (two) times daily. 02/23/22   Jerline Pain, MD  ?metFORMIN (GLUCOPHAGE) 500 MG tablet Take 500 mg by mouth daily at 12 noon.    [provider]  ?Multiple Vitamins-Minerals (PRESERVISION AREDS 2 PO) Take by mouth in the morning and at bedtime.    [provider]  ?nitroGLYCERIN (  NITROSTAT) 0.4 MG SL tablet PLACE 1 TABLET (0.4 MG TOTAL) UNDER THE TONGUE EVERY 5 (FIVE) MINUTES AS NEEDED FOR CHEST PAIN. 01/10/21   Jerline Pain, MD  ?Omega-3 Fatty Acids (FISH OIL) 1200 MG CAPS Take 1,200 mg by mouth daily.    [provider]  ?ondansetron (ZOFRAN) 4 MG tablet Take 4 mg by mouth as needed.    [provider]  ?Donald Siva test strip CHECK ONCE DAILY AS DIRECTED 05/29/21   [provider]  ?saccharomyces boulardii (FLORASTOR) 250 MG capsule See admin instructions. 12/23/21   [provider]  ?vancomycin (VANCOCIN) 125 MG capsule 1 capsule 12/27/21   [provider]  ?zolpidem (AMBIEN) 5 MG  tablet Take 5 mg by mouth at bedtime.     [provider]  ?   ? ?Allergies    ?Amlodipine, Benadryl [diphenhydramine hcl (sleep)], Ciprofloxacin, Lactose intolerance (gi), Macrobid [nitrofurantoin monohyd macro], Naproxen, Quinine sulfate [quinine], Rosuvastatin, Sulfa antibiotics, Tramadol, Penicillins, and Prednisone   ? ?Review of Systems   ?Review of Systems  ?Constitutional:  Negative for chills and fever.  ?Gastrointestinal:  Negative for abdominal pain, nausea and vomiting.  ?Genitourinary:  Positive for dysuria, frequency and urgency. Negative for hematuria.  ?All other systems reviewed and are negative. ? ?Physical Exam ?Updated Vital Signs ?BP (!) 134/55   Pulse 72   Temp 98.2 ?F (36.8 ?C) (Oral)   Resp 16   LMP  (LMP Unknown)   SpO2 99%  ?Physical Exam ?Vitals and nursing note reviewed.  ?Constitutional:   ?   General: She is not in acute distress. ?   Appearance: She is not diaphoretic.  ?HENT:  ?   Head: Normocephalic and atraumatic.  ?   Mouth/Throat:  ?   Pharynx: No oropharyngeal exudate.  ?Eyes:  ?   General: No scleral icterus. ?   Conjunctiva/sclera: Conjunctivae normal.  ?Cardiovascular:  ?   Rate and Rhythm: Normal rate and regular rhythm.  ?   Pulses: Normal pulses.  ?   Heart sounds: Normal heart sounds.  ?Pulmonary:  ?   Effort: Pulmonary effort is normal. No respiratory distress.  ?   Breath sounds: Normal breath sounds. No wheezing.  ?Abdominal:  ?   General: Bowel sounds are normal.  ?   Palpations: Abdomen is soft. There is no mass.  ?   Tenderness: There is no abdominal tenderness. There is no right CVA tenderness, left CVA tenderness, guarding or rebound.  ?Musculoskeletal:     ?   General: Normal range of motion.  ?   Cervical back: Normal range of motion and neck supple.  ?Skin: ?   General: Skin is warm and dry.  ?Neurological:  ?   Mental Status: She is alert.  ?Psychiatric:     ?   Behavior: Behavior normal.  ? ? ?ED Results / Procedures / Treatments   ?Labs ?(all  labs ordered are listed, but only abnormal results are displayed) ?Labs Reviewed  ?URINALYSIS, ROUTINE W REFLEX MICROSCOPIC - Abnormal; Notable for the following components:  ?    Result Value  ? APPearance CLOUDY (*)   ? Hgb urine dipstick LARGE (*)   ? Protein, ur 30 (*)   ? Nitrite POSITIVE (*)   ? Leukocytes,Ua LARGE (*)   ? WBC, UA >50 (*)   ? Bacteria, UA RARE (*)   ? All other components within normal limits  ?URINE CULTURE  ? ? ?EKG ?None ? ?Radiology ?No results found. ? ?Procedures ?  Procedures  ? ? ?Medications Ordered in ED ?Medications - No data to display ? ?ED Course/ Medical Decision Making/ A&P ?  ?                        ?Medical Decision Making ?Amount and/or Complexity of Data Reviewed ?Labs: ordered. ? ?Risk ?Prescription drug management. ? ? ?Patient with dysuria, frequency, urgency onset 5 days.  She has a past medical history of UTIs (more notably 13 years ago).  Her last UTI was last week and was treated with cefuroxime.  Vital signs stable, patient afebrile, not tachycardic or hypoxic.  On exam patient without any acute cardiovascular, respiratory, down exam findings.  No CVA tenderness noted on exam.  Differential diagnosis includes acute cystitis, pyelonephritis, nephrolithiasis ?  ? ?Additional history obtained:  ?Additional history obtained from Daughter/Son ? ?Labs:  ?I ordered, and personally interpreted labs.  The pertinent results include:   ?Urinalysis notable for large amount of hemoglobin, nitrate positive, large leukocytes. ?Urine culture sent with results pending at time of discharge. ? ? ?Disposition: ?Patient presentation suspicious for acute cystitis.  Doubt pyelonephritis, nephrolithiasis at this time. After consideration of the diagnostic results and the patients response to treatment, I feel that the patient would benefit from Discharge home.  Patient provided with on-call urology group to call and set up a follow-up appointment during today's ED visit.  Urine culture  sent.  Discussed with patient's importance of following up with primary care provider.  Supportive care measures and strict return precautions discussed with patient at bedside. Pt acknowledges and verbalizes u

## 2022-03-18 NOTE — Discharge Instructions (Addendum)
It was a pleasure taking care of you today!  ? ?Your labs were unremarkable. Your urine was notable for a UTI. You will be sent a prescription for Cefuroxime, take as prescribed. Ensure that you complete the entire course of antibiotic.  Attached is information for the on-call neurologist, call and set up a follow-up appointment regarding today's ED visit. Ensure to maintain fluid intake.  You may follow-up with your primary care provider as needed.  Return to the emergency department if you are experiencing increasing or worsening abdominal pain, urinary symptoms, fever, or decreased fluid intake. ?

## 2022-03-18 NOTE — ED Triage Notes (Signed)
Pt was treated for uti, prescribed 5 days of antibiotic , finished Tuesday. Today pressure/pain in pelvis started again, painful urination, no fever.  ?

## 2022-03-20 LAB — URINE CULTURE: Culture: 80000 — AB

## 2022-03-21 ENCOUNTER — Telehealth: Payer: Self-pay | Admitting: *Deleted

## 2022-03-21 NOTE — Telephone Encounter (Signed)
Post ED Visit - Positive Culture Follow-up ? ?Culture report reviewed by antimicrobial stewardship pharmacist: ?[facility] Team ?'[]'$  Elenor Quinones, Pharm.D. ?'[]'$  Heide Guile, Pharm.D., BCPS AQ-ID ?'[]'$  Parks Neptune, Pharm.D., BCPS ?'[]'$  Alycia Rossetti, Pharm.D., BCPS ?'[]'$  [place], Pharm.D., BCPS, AAHIVP ?'[]'$  Legrand Como, Pharm.D., BCPS, AAHIVP ?'[]'$  Salome Arnt, PharmD, BCPS ?'[]'$  Johnnette Gourd, PharmD, BCPS ?'[]'$  Hughes Better, PharmD, BCPS ?'[]'$  Leeroy Cha, PharmD ?'[]'$  Laqueta Linden, PharmD, BCPS ?'[]'$  Albertina Parr, PharmD ? ?[facility] Team ?'[]'$  Leodis Sias, PharmD ?'[]'$  Lindell Spar, PharmD ?'[]'$  Royetta Asal, PharmD ?'[]'$  Graylin Shiver, Rph ?'[]'$  Rema Fendt) Glennon Mac, PharmD ?'[]'$  Arlyn Dunning, PharmD ?'[]'$  Netta Cedars, PharmD ?'[]'$  Dia Sitter, PharmD ?'[]'$  Leone Haven, PharmD ?'[]'$  Gretta Arab, PharmD ?'[]'$  Theodis Shove, PharmD ?'[]'$  Peggyann Juba, PharmD ?'[]'$  Reuel Boom, PharmD ? ? ?Positive urine culture ?Treated with Cefuroxime Axetil, organism sensitive to the same and no further patient follow-up is required at this time.  Reatha Harps, PharmD ? ?Ardeen Fillers ?03/21/2022, 11:51 AM ?  ?

## 2022-03-23 DIAGNOSIS — R399 Unspecified symptoms and signs involving the genitourinary system: Secondary | ICD-10-CM | POA: Diagnosis not present

## 2022-04-03 DIAGNOSIS — M1711 Unilateral primary osteoarthritis, right knee: Secondary | ICD-10-CM | POA: Diagnosis not present

## 2022-04-10 ENCOUNTER — Other Ambulatory Visit: Payer: Self-pay | Admitting: Cardiology

## 2022-04-18 ENCOUNTER — Ambulatory Visit (INDEPENDENT_AMBULATORY_CARE_PROVIDER_SITE_OTHER): Payer: Medicare Other | Admitting: Cardiology

## 2022-04-18 ENCOUNTER — Encounter: Payer: Self-pay | Admitting: Cardiology

## 2022-04-18 VITALS — BP 120/50 | HR 69 | Ht 63.0 in | Wt 133.0 lb

## 2022-04-18 DIAGNOSIS — I2583 Coronary atherosclerosis due to lipid rich plaque: Secondary | ICD-10-CM

## 2022-04-18 DIAGNOSIS — Z01810 Encounter for preprocedural cardiovascular examination: Secondary | ICD-10-CM | POA: Diagnosis not present

## 2022-04-18 DIAGNOSIS — I2510 Atherosclerotic heart disease of native coronary artery without angina pectoris: Secondary | ICD-10-CM | POA: Diagnosis not present

## 2022-04-18 DIAGNOSIS — I350 Nonrheumatic aortic (valve) stenosis: Secondary | ICD-10-CM

## 2022-04-18 NOTE — Assessment & Plan Note (Signed)
Murmur heard on exam.  Most recent echocardiogram September 2022 shows normal ejection fraction with moderate aortic stenosis.  Personally reviewed.  Nuclear stress test was low risk in 2021.  We will continue to monitor with echo.  At some point may require TAVR surgery. ?

## 2022-04-18 NOTE — Assessment & Plan Note (Signed)
She may proceed with knee surgery, Dr. Wynelle Link, with low to moderate overall cardiac risk mainly driven by age.  She has not had any high risk symptoms such as syncope angina severe shortness of breath or arrhythmias.  Her EKG is unremarkable today. ?

## 2022-04-18 NOTE — Patient Instructions (Signed)

## 2022-04-18 NOTE — Assessment & Plan Note (Signed)
Prior remote history of CAD.  Recent stress testing with no ischemia.  Reassuring. ?

## 2022-04-18 NOTE — Progress Notes (Signed)
?Cardiology Office Note:   ? ?Date:  04/18/2022  ? ?ID:  Barbara Russell, DOB [birth year], MRN [id number] ? ?PCP:  Seward Carol, MD ?  ?[facility] Providers ?Cardiologist:  Candee Furbish, MD    ? ?Referring MD: Seward Carol, MD  ? ? ?History of Present Illness:   ? ?Barbara Russell is a 88 y.o. female here for the follow-up of hypertension with upcoming knee surgery. ? ?Prior chest discomfort in April 2021 with nuclear stress test that showed no abnormalities.  She woke up at night with pressure.  She walked in today with a walker.  Knee pain pretty significant. ? ?She and Melissa have worked on her blood pressure.  She had asked for losartan to be split to 50 twice daily for ease of use.  Sometimes she experiences low blood pressure.  Sometimes she experiences high and takes the hydralazine 25.  She is also been using Advil for pain.  She has stopped driving because of immobility issues. ? ?Had CDIFF prior year.  Now in senior living  ? ?Past Medical History:  ?Diagnosis Date  ? Aortic heart murmur   ? aortic sclerosis w/out and significant stenosis  ? Breast cancer ([place])   ? Hx of BC, s/p mastectomy on right 1999, s/p tamoxifen X1 year  ? Chest pain   ? EC Stress test 12/21/11 Low risk, no ischemia, normal EF  ? Coronary artery disease   ? Remote Hx of CAD, normal stress test 2009  ? Diabetes mellitus without complication ([place])   ? Dyspnea   ? Hypercholesteremia   ? Hypertension   ? Lichen sclerosus   ? Low back pain   ? Myocardial infarction [facility]) 2000  ? Osteoarthrosis, unspecified whether generalized or localized, unspecified site   ? Pain   ? Several year Hx of pain/ SOB radiating to her neck  ? SOB (shortness of breath)   ? ? ?Past Surgical History:  ?Procedure Laterality Date  ? APPENDECTOMY    ? HAND SURGERY Bilateral   ? CTS and trigger finger on right and left  ? MASTECTOMY Right 1999  ? REPLACEMENT TOTAL KNEE Right 11/2009  ? VESICOVAGINAL FISTULA CLOSURE W/ TAH  1969  ? ? ?Current  Medications: ?Current Meds  ?Medication Sig  ? ALPRAZolam (XANAX) 0.25 MG tablet Take 0.125 mg by mouth daily as needed.  ? aspirin 81 MG tablet Take 81 mg by mouth daily.  ? b complex vitamins tablet Take 1 tablet by mouth daily.  ? cetirizine (ZYRTEC) 10 MG tablet Take 10 mg by mouth daily as needed for allergies.   ? cholecalciferol (VITAMIN D) 1000 UNITS tablet Take 1,000 Units by mouth daily.   ? clindamycin (CLEOCIN) 150 MG capsule   ? clobetasol ointment (TEMOVATE) 0.05 % SMARTSIG:Sparingly Topical Twice Daily  ? clobetasol ointment (TEMOVATE) 0.05 % clobetasol 0.05 % topical ointment ? APPLY THIN COAT TO AFFECTED AREA TWICE A DAY  ? diclofenac Sodium (VOLTAREN) 1 % GEL Apply 2 g topically daily as needed.  ? doxycycline (VIBRAMYCIN) 100 MG capsule Take 1 capsule (100 mg total) by mouth 2 (two) times daily.  ? furosemide (LASIX) 20 MG tablet TAKE 1/2 TABLET BY MOUTH AS NEEDED  ? gabapentin (NEURONTIN) 300 MG capsule Take 600 mg by mouth at bedtime.   ? hydrALAZINE (APRESOLINE) 25 MG tablet Take 25 mg by mouth as needed.  ? hydrochlorothiazide (HYDRODIURIL) 25 MG tablet TAKE 1 TABLET BY MOUTH EVERY DAY  ? isosorbide mononitrate (IMDUR) 30  MG 24 hr tablet TAKE 1 TABLET BY MOUTH EVERY DAY  ? losartan (COZAAR) 50 MG tablet Take 1 tablet (50 mg total) by mouth 2 (two) times daily.  ? metFORMIN (GLUCOPHAGE) 500 MG tablet Take 500 mg by mouth daily at 12 noon.  ? Multiple Vitamins-Minerals (PRESERVISION AREDS 2 PO) Take by mouth in the morning and at bedtime.  ? nitroGLYCERIN (NITROSTAT) 0.4 MG SL tablet PLACE 1 TABLET UNDER THE TONGUE EVERY 5 MINUTES AS NEEDED FOR CHEST PAIN.  ? Omega-3 Fatty Acids (FISH OIL) 1200 MG CAPS Take 1,200 mg by mouth daily.  ? ondansetron (ZOFRAN) 4 MG tablet Take 4 mg by mouth as needed.  ? ONETOUCH ULTRA test strip CHECK ONCE DAILY AS DIRECTED  ? saccharomyces boulardii (FLORASTOR) 250 MG capsule See admin instructions.  ? vancomycin (VANCOCIN) 125 MG capsule 1 capsule  ? zolpidem  (AMBIEN) 5 MG tablet Take 5 mg by mouth at bedtime.   ?  ? ?Allergies:   Amlodipine, Benadryl [diphenhydramine hcl (sleep)], Ciprofloxacin, Lactose intolerance (gi), Macrobid [nitrofurantoin monohyd macro], Naproxen, Quinine sulfate [quinine], Rosuvastatin, Sulfa antibiotics, Tramadol, Penicillins, and Prednisone  ? ?Social History  ? ?Socioeconomic History  ? Marital status: Widowed  ?  Spouse name: Not on file  ? Number of children: Not on file  ? Years of education: Not on file  ? Highest education level: Not on file  ?Occupational History  ? Not on file  ?Tobacco Use  ? Smoking status: Never  ? Smokeless tobacco: Never  ?Vaping Use  ? Vaping Use: Never used  ?Substance and Sexual Activity  ? Alcohol use: No  ? Drug use: No  ? Sexual activity: Not on file  ?Other Topics Concern  ? Not on file  ?Social History Narrative  ? Not on file  ? ?Social Determinants of Health  ? ?Financial Resource Strain: Not on file  ?Food Insecurity: Not on file  ?Transportation Needs: Not on file  ?Physical Activity: Not on file  ?Stress: Not on file  ?Social Connections: Not on file  ?  ? ?Family History: ?The patient's family history includes Cancer in her mother; Heart disease in her father. ? ?ROS:   ?Please see the history of present illness.    ? All other systems reviewed and are negative. ? ?EKGs/Labs/Other Studies Reviewed:   ? ?The following studies were reviewed today: ?MYOCARDIAL PERFUSION 04/21:  ?Nuclear stress EF: 79%. No wall motion abnormalities detected ?There was no ST segment deviation noted during stress. ?This is a low risk study. There is no ischemia identified ?The study is normal. ? ?ECHO 09/22:  ?IMPRESSIONS  ? ? 1. Left ventricular ejection fraction by 3D volume is 70 %. The left  ?ventricle has hyperdynamic function. The left ventricle has no regional  ?wall motion abnormalities. Left ventricular diastolic parameters are  ?consistent with Grade I diastolic  ?dysfunction (impaired relaxation). Elevated left  atrial pressure. The  ?average left ventricular global longitudinal strain is 23.7 %. The global  ?longitudinal strain is normal.  ? 2. Right ventricular systolic function is normal. The right ventricular  ?size is normal. There is normal pulmonary artery systolic pressure.  ? 3. Left atrial size was mild to moderately dilated.  ? 4. The mitral valve is degenerative. Mild mitral valve regurgitation. No  ?evidence of mitral stenosis. Severe mitral annular calcification.  ? 5. The aortic valve is tricuspid. There is severe calcification of the  ?aortic valve. There is severe thickening of the aortic valve. Aortic valve  ?  regurgitation is not visualized. Moderate to severe aortic valve stenosis.  ? ?Comparison(s): Prior images reviewed side by side. Aortic stenosis is now  ?moderate to severe.  ?  ? ?EKG:  EKG is  ordered today.  The ekg ordered today demonstrates normal sinus rhythm 69 poor R wave progression left axis deviation prior inferior infarct pattern.  Unchanged. ? ?Recent Labs: ?12/04/2021: BUN 34; Creatinine, Ser 0.93; Hemoglobin 11.5; Platelets 237; Potassium 3.5; Sodium 133  ?Recent Lipid Panel ?No results found for: CHOL, TRIG, HDL, CHOLHDL, VLDL, LDLCALC, LDLDIRECT ? ? ?Risk Assessment/Calculations:   ? ? ?    ? ?   ? ?Physical Exam:   ? ?VS:  BP (!) 120/50 (BP Location: Left Arm, Patient Position: Sitting, Cuff Size: Normal)   Pulse 69   Ht '5\' 3"'$  (1.6 m)   Wt 133 lb (60.3 kg)   LMP  (LMP Unknown)   SpO2 96%   BMI 23.56 kg/m?    ? ?Wt Readings from Last 3 Encounters:  ?04/18/22 133 lb (60.3 kg)  ?12/04/21 139 lb (63 kg)  ?09/01/21 149 lb 3.2 oz (67.7 kg)  ?  ? ?GEN:  Well nourished, well developed in no acute distress ?HEENT: Normal ?NECK: No JVD; No carotid bruits ?LYMPHATICS: No lymphadenopathy ?CARDIAC: RRR, 3/6 systolic murmur, no rubs, gallops ?RESPIRATORY:  Clear to auscultation without rales, wheezing or rhonchi  ?ABDOMEN: Soft, non-tender, non-distended ?MUSCULOSKELETAL:  No edema; No  deformity  ?SKIN: Warm and dry ?NEUROLOGIC:  Alert and oriented x 3 ?PSYCHIATRIC:  Normal affect  ? ?ASSESSMENT:   ? ?1. Coronary artery disease due to lipid rich plaque   ?2. Pre-operative cardiovascular examination   ?3. Nonrheumat

## 2022-04-26 ENCOUNTER — Ambulatory Visit: Payer: Medicare Other | Admitting: Podiatry

## 2022-04-26 DIAGNOSIS — R269 Unspecified abnormalities of gait and mobility: Secondary | ICD-10-CM | POA: Diagnosis not present

## 2022-04-26 DIAGNOSIS — I10 Essential (primary) hypertension: Secondary | ICD-10-CM | POA: Diagnosis not present

## 2022-04-26 DIAGNOSIS — G4700 Insomnia, unspecified: Secondary | ICD-10-CM | POA: Diagnosis not present

## 2022-04-26 DIAGNOSIS — E1169 Type 2 diabetes mellitus with other specified complication: Secondary | ICD-10-CM | POA: Diagnosis not present

## 2022-04-26 DIAGNOSIS — Z1331 Encounter for screening for depression: Secondary | ICD-10-CM | POA: Diagnosis not present

## 2022-04-26 DIAGNOSIS — E7800 Pure hypercholesterolemia, unspecified: Secondary | ICD-10-CM | POA: Diagnosis not present

## 2022-04-26 DIAGNOSIS — N1831 Chronic kidney disease, stage 3a: Secondary | ICD-10-CM | POA: Diagnosis not present

## 2022-04-26 DIAGNOSIS — Z0000 Encounter for general adult medical examination without abnormal findings: Secondary | ICD-10-CM | POA: Diagnosis not present

## 2022-04-26 DIAGNOSIS — I350 Nonrheumatic aortic (valve) stenosis: Secondary | ICD-10-CM | POA: Diagnosis not present

## 2022-05-01 ENCOUNTER — Ambulatory Visit: Payer: Medicare Other | Admitting: Podiatry

## 2022-05-04 ENCOUNTER — Telehealth: Payer: Self-pay | Admitting: Pharmacist

## 2022-05-04 NOTE — Telephone Encounter (Signed)
Patient called today stating that her feet are swollen for 5 days now. States she is elevating her feet. Does not have compressions stockings and doesn't think she could put on. She said Dr. Marlou Porch told her not to take a whole lasix, but she did this AM anyway. Hasn't noticed any difference. My advice would have been to take '20mg'$  for 2-3 days. But since she said Dr. Marlou Porch told her not to,I will send to Dr. Marlou Porch for advice.

## 2022-05-09 NOTE — Telephone Encounter (Signed)
LVM for pt to call back to review Dr. Marlou Porch' recommendations

## 2022-05-09 NOTE — H&P (Signed)
TOTAL KNEE REVISION ADMISSION H&P  Patient is being admitted for right total knee arthroplasty revision.  Subjective:  HPI: Barbara Russell, 88 y.o. female presents for pre-operative visit in preparation for their right knee polyethylene versus total knee arthroplasty revision, which is scheduled on 05/31/2022 with Dr. Wynelle Link at [facility]. The patient has had symptoms in the right knee including pain and instability which has impacted their quality of life and ability to do activities of daily living. The patient currently has a diagnosis of unstable right total knee arthroplasty and has failed conservative treatments including activity modification and use of brace. The patient has had previous right total knee arthroplasty on the right knee. The patient denies an active infection.  Patient Active Problem List   Diagnosis Date Noted   Pre-operative cardiovascular examination 04/18/2022   Breast cancer ([place]) 02/02/2021   Osteoarthritis 02/02/2021   Osteopenia 02/02/2021   Hyperlipidemia 03/10/2020   Hypertension 02/23/2020   Osteoarthritis of right knee 08/31/2018   Low back pain 03/07/2018   Coronary artery disease due to lipid rich plaque 11/26/2014   Old MI (myocardial infarction) 04/07/2014   Aortic stenosis 04/07/2014   Diabetes ([place]) 04/07/2014   Angina decubitus ([place]) 04/07/2014    Past Medical History:  Diagnosis Date   Aortic heart murmur    aortic sclerosis w/out and significant stenosis   Breast cancer (HCC)    Hx of BC, s/p mastectomy on right 1999, s/p tamoxifen X1 year   Chest pain    EC Stress test 12/21/11 Low risk, no ischemia, normal EF   Coronary artery disease    Remote Hx of CAD, normal stress test 2009   Diabetes mellitus without complication (HCC)    Dyspnea    Hypercholesteremia    Hypertension    Lichen sclerosus    Low back pain    Myocardial infarction ([place]) 2000   Osteoarthrosis, unspecified whether generalized or localized, unspecified  site    Pain    Several year Hx of pain/ SOB radiating to her neck   SOB (shortness of breath)     Past Surgical History:  Procedure Laterality Date   APPENDECTOMY     HAND SURGERY Bilateral    CTS and trigger finger on right and left   MASTECTOMY Right 1999   REPLACEMENT TOTAL KNEE Right 11/2009   VESICOVAGINAL FISTULA CLOSURE W/ TAH  1969    Prior to Admission medications   Medication Sig Start Date End Date Taking? Authorizing Provider  ALPRAZolam (XANAX) 0.25 MG tablet Take 0.125 mg by mouth daily as needed. 12/29/19   [provider]  aspirin 81 MG tablet Take 81 mg by mouth daily.    [provider]  b complex vitamins tablet Take 1 tablet by mouth daily.    [provider]  cetirizine (ZYRTEC) 10 MG tablet Take 10 mg by mouth daily as needed for allergies.     [provider]  cholecalciferol (VITAMIN D) 1000 UNITS tablet Take 1,000 Units by mouth daily.     [provider]  clindamycin (CLEOCIN) 150 MG capsule  08/10/20   [provider]  clobetasol ointment (TEMOVATE) 0.05 % SMARTSIG:Sparingly Topical Twice Daily 09/07/21   [provider]  clobetasol ointment (TEMOVATE) 0.05 % clobetasol 0.05 % topical ointment  APPLY THIN COAT TO AFFECTED AREA TWICE A DAY    [provider]  diclofenac Sodium (VOLTAREN) 1 % GEL Apply 2 g topically daily as needed.    [provider]  doxycycline (VIBRAMYCIN) 100 MG capsule Take 1 capsule (100 mg total) by mouth 2 (two) times daily. 12/04/21   Regan Lemming, MD  furosemide (LASIX) 20 MG tablet TAKE 1/2 TABLET BY MOUTH AS NEEDED 01/16/22   Jerline Pain, MD  gabapentin (NEURONTIN) 300 MG capsule Take 600 mg by mouth at bedtime.     [provider]  hydrALAZINE (APRESOLINE) 25 MG tablet Take 25 mg by mouth as needed.    [provider]  hydrochlorothiazide (HYDRODIURIL) 25 MG tablet TAKE 1 TABLET BY MOUTH EVERY DAY 11/07/21   Jerline Pain, MD   isosorbide mononitrate (IMDUR) 30 MG 24 hr tablet TAKE 1 TABLET BY MOUTH EVERY DAY 11/07/21   Jerline Pain, MD  losartan (COZAAR) 50 MG tablet Take 1 tablet (50 mg total) by mouth 2 (two) times daily. 02/23/22   Jerline Pain, MD  metFORMIN (GLUCOPHAGE) 500 MG tablet Take 500 mg by mouth daily at 12 noon.    [provider]  Multiple Vitamins-Minerals (PRESERVISION AREDS 2 PO) Take by mouth in the morning and at bedtime.    [provider]  nitroGLYCERIN (NITROSTAT) 0.4 MG SL tablet PLACE 1 TABLET UNDER THE TONGUE EVERY 5 MINUTES AS NEEDED FOR CHEST PAIN. 04/11/22   Jerline Pain, MD  Omega-3 Fatty Acids (FISH OIL) 1200 MG CAPS Take 1,200 mg by mouth daily.    [provider]  ondansetron (ZOFRAN) 4 MG tablet Take 4 mg by mouth as needed.    [provider]  [facility] ULTRA test strip CHECK ONCE DAILY AS DIRECTED 05/29/21   [provider]  saccharomyces boulardii (FLORASTOR) 250 MG capsule See admin instructions. 12/23/21   [provider]  vancomycin (VANCOCIN) 125 MG capsule 1 capsule 12/27/21   [provider]  zolpidem (AMBIEN) 5 MG tablet Take 5 mg by mouth at bedtime.     [provider]    Allergies  Allergen Reactions   Amlodipine     LEE with '5mg'$  dosing   Benadryl [Diphenhydramine Hcl (Sleep)] Other (See Comments)    Hyperstimulation   Ciprofloxacin Hives   Lactose Intolerance (Gi) Diarrhea   Macrobid [Nitrofurantoin Monohyd Macro] Nausea And Vomiting   Naproxen Other (See Comments)    Stomach pain   Quinine Sulfate [Quinine] Hives   Rosuvastatin     headache   Sulfa Antibiotics Hives        Tramadol Other (See Comments)    unknown to patient   Penicillins Rash    Has patient had a PCN reaction causing immediate rash, facial/tongue/throat swelling, SOB or lightheadedness with hypotension: Yes Has patient had a PCN reaction causing severe rash involving mucus membranes or skin necrosis: No Has patient  had a PCN reaction that required hospitalization: No Has patient had a PCN reaction occurring within the last 10 years: No If all of the above answers are "NO", then may proceed with Cephalosporin use.    Prednisone Other (See Comments)    Weakness and pain in joints    Social History   Socioeconomic History   Marital status: Widowed    Spouse name: Not on file   Number of children: Not on file   Years of education: Not on file   Highest education level: Not on file  Occupational History   Not on file  Tobacco Use   Smoking status: Never   Smokeless tobacco: Never  Vaping Use   Vaping Use: Never used  Substance and Sexual  Activity   Alcohol use: No   Drug use: No   Sexual activity: Not on file  Other Topics Concern   Not on file  Social History Narrative   Not on file   Social Determinants of Health   Financial Resource Strain: Not on file  Food Insecurity: Not on file  Transportation Needs: Not on file  Physical Activity: Not on file  Stress: Not on file  Social Connections: Not on file  Intimate Partner Violence: Not on file    Tobacco Use: Low Risk    Smoking Tobacco Use: Never   Smokeless Tobacco Use: Never   Passive Exposure: Not on file   Social History   Substance and Sexual Activity  Alcohol Use No    Family History  Problem Relation Age of Onset   Cancer Mother        cervical   Heart disease Father     Review of Systems  Constitutional:  Negative for chills and fever.  HENT:  Negative for congestion, sore throat and tinnitus.   Eyes:  Negative for double vision, photophobia and pain.  Respiratory:  Negative for cough, shortness of breath and wheezing.   Cardiovascular:  Negative for chest pain, palpitations and orthopnea.  Gastrointestinal:  Negative for heartburn, nausea and vomiting.  Genitourinary:  Negative for dysuria, frequency and urgency.  Musculoskeletal:  Positive for joint pain.  Neurological:  Negative for dizziness, weakness  and headaches.   Objective:  Physical Exam: Well nourished and well developed.  General: Alert and oriented x3, cooperative and pleasant, no acute distress.  Head: normocephalic, atraumatic, neck supple.  Eyes: EOMI.  Musculoskeletal:  Right Knee Exam:  No warmth or effusion present. No swelling present.  The range of motion is: 0 to 110 degrees.  No crepitus on range of motion of the knee.  No medial joint line tenderness.  No lateral joint line tenderness.  Significant varus/valgus laxity in extension and AP laxity in flexion.  Calves soft and nontender. Motor function intact in LE. Strength 5/5 LE bilaterally. Neuro: Distal pulses 2+. Sensation to light touch intact in LE.   Imaging Review AP and lateral of the right knee dated 12/21/2021, demonstrate Johnson and Brule prosthesis in place. She has a rotating platform. There is a questionable lucency around the tibia, but no definitive loosening. Bone scan negative.  Assessment/Plan:  Unstable right total knee arthroplasty  The patient history, physical examination, clinical judgment of the provider and imaging studies are consistent with failed right TKA and total knee revision arthroplasty is deemed medically necessary. The treatment options including medical management, injection therapy arthroscopy and arthroplasty were discussed at length. The risks and benefits of total knee arthroplasty were presented and reviewed. The risks due to aseptic loosening, infection, stiffness, patella tracking problems, thromboembolic complications and other imponderables were discussed. The patient acknowledged the explanation, agreed to proceed with the plan and consent was signed. Patient is being admitted for inpatient treatment for surgery, pain control, PT, OT, prophylactic antibiotics, VTE prophylaxis, progressive ambulation and ADLs and discharge planning. The patient is planning to be discharged  home .   Therapy Plans: HHPT with  [facility] Disposition: [facility] to [place] at [facility] DVT Prophylaxis: Xarelto 10 mg QD DME Needed: None PCP: Seward Carol, MD  Cardiologist: Candee Furbish, MD (clearance received) TXA: IV Allergies: PCN (rash), benadryl Anesthesia Concerns: None BMI: 23.6 Last HgbA1c: Unsure. Will check with preop labs. Pharmacy: [facility] (bring to room)  Other: - Saw Dr.  Polite two weeks ago for clearance, taking form by office today - Hx breast CA, no IV sticks or BP in right arm - Hx CAD from 2000   - Patient was instructed on what medications to stop prior to surgery. - Follow-up visit in 2 weeks with Dr. Wynelle Link - Begin physical therapy following surgery - Pre-operative lab work as pre-surgical testing - Prescriptions will be provided in hospital at time of discharge  Theresa Duty, PA-C Orthopedic Surgery [facility] [place]

## 2022-05-10 ENCOUNTER — Encounter (HOSPITAL_COMMUNITY): Payer: Self-pay | Admitting: Orthopedic Surgery

## 2022-05-10 ENCOUNTER — Other Ambulatory Visit (HOSPITAL_COMMUNITY): Payer: Self-pay

## 2022-05-10 NOTE — Progress Notes (Signed)
COVID Vaccine Completed:  Date of COVID positive in last 90 days:  PCP - Seward Carol, MD Cardiologist - Candee Furbish, MD     Cardiac Clearance in Epic 04-18-22 by Dr. Marlou Porch  Chest x-ray -  EKG - 04-18-22  Epic Stress Test - 04-05-20 Epic ECHO - 08-25-21  Epic Cardiac Cath -  Pacemaker/ICD device last checked: Spinal Cord Stimulator:  Bowel Prep -   Sleep Study -  CPAP -   Fasting Blood Sugar -  Checks Blood Sugar _____ times a day  Blood Thinner Instructions: Aspirin Instructions: ASA '81mg'$  Last Dose:  Activity level:  Can go up a flight of stairs and perform activities of daily living without stopping and without symptoms of chest pain or shortness of breath.   Able to exercise without symptoms  Unable to go up a flight of stairs without symptoms of      Anesthesia review: Aortic stenosis, murmur, CAD, Hx MI, Angina, HTN, DM, ?Dysphagia, CKD  Patient denies shortness of breath, fever, cough and chest pain at PAT appointment   Patient verbalized understanding of instructions that were given to them at the PAT appointment. Patient was also instructed that they will need to review over the PAT instructions again at home before surgery.

## 2022-05-10 NOTE — Patient Instructions (Signed)
DUE TO COVID-19 ONLY TWO VISITORS  (aged 86 and older)  IS ALLOWED TO COME WITH YOU AND STAY IN THE WAITING ROOM ONLY DURING PRE OP AND PROCEDURE.   **NO VISITORS ARE ALLOWED IN THE SHORT STAY AREA OR RECOVERY ROOM!!**  IF YOU WILL BE ADMITTED INTO THE HOSPITAL YOU ARE ALLOWED ONLY FOUR SUPPORT PEOPLE DURING VISITATION HOURS ONLY (7 AM -8PM)   The support person(s) must pass our screening, gel in and out Visitors GUEST BADGE MUST BE WORN VISIBLY  One adult visitor may remain with you overnight and MUST be in the room by 8 P.M.   You are not required to [employer] often Do NOT share personal items Notify your provider if you are in close contact with someone who has COVID or you develop fever 100.4 or greater, new onset of sneezing, cough, sore throat, shortness of breath or body aches.        Your procedure is scheduled on:  May 17, 2022    Report to [facility] Main Entrance    Report to admitting at  1:00 PM   Call this number if you have problems the morning of surgery [phone]   Do not eat food :After Midnight.   After Midnight you may have the following liquids until 12:45 PM DAY OF SURGERY  Clear Liquid Diet Water Black Coffee (sugar ok, NO MILK/CREAM OR CREAMERS)  Tea (sugar ok, NO MILK/CREAM OR CREAMERS) regular and decaf                             Plain Jell-O (NO RED)                                           Fruit ices (not with fruit pulp, NO RED)                                     Popsicles (NO RED)                                                                  Juice: apple, WHITE grape, WHITE cranberry Sports drinks like Gatorade (NO RED) Clear broth(vegetable,chicken,beef)                   The day of surgery:  Drink ONE (1) Pre-Surgery G2 at 12:45 PM the morning of surgery. Drink in one sitting. Do not sip.  This drink was given to you during your hospital  pre-op appointment visit. Nothing else to drink after completing the  Pre-Surgery G2.          If you have questions, please contact your surgeon's office.   FOLLOW ANY ADDITIONAL PRE OP INSTRUCTIONS YOU RECEIVED FROM YOUR SURGEON'S OFFICE!!!     Oral Hygiene is also important to reduce your risk of infection.  Remember - BRUSH YOUR TEETH THE MORNING OF SURGERY WITH YOUR REGULAR TOOTHPASTE   Do NOT smoke after Midnight   Take these medicines the morning of surgery with A SIP OF WATER: Cetririzine, Hydralazine, Isosorbide, Ondansetron, Systane eye drops                           You may not have any metal on your body including hair pins, jewelry, and body piercing             Do not wear make-up, lotions, powders, perfumes or deodorant  Do not wear nail polish including gel and S&S, artificial/acrylic nails, or any other type of covering on natural nails including finger and toenails. If you have artificial nails, gel coating, etc. that needs to be removed by a nail salon please have this removed prior to surgery or surgery may need to be canceled/ delayed if the surgeon/ anesthesia feels like they are unable to be safely monitored.   Do not shave  48 hours prior to surgery.               Do not bring valuables to the hospital. [place].   Contacts, dentures or bridgework may not be worn into surgery.   Bring small overnight bag day of surgery.   Special Instructions: Bring a copy of your healthcare power of attorney and living will documents the day of surgery if you haven't scanned them before.  Please read over the following fact sheets you were given: IF YOU HAVE QUESTIONS ABOUT YOUR PRE-OP INSTRUCTIONS PLEASE CALL [facility] - Preparing for Surgery Before surgery, you can play an important role.  Because skin is not sterile, your skin needs to be as free of germs as possible.  You can reduce the number of germs on your skin by washing with CHG (chlorahexidine  gluconate) soap before surgery.  CHG is an antiseptic cleaner which kills germs and bonds with the skin to continue killing germs even after washing. Please DO NOT use if you have an allergy to CHG or antibacterial soaps.  If your skin becomes reddened/irritated stop using the CHG and inform your nurse when you arrive at Short Stay. Do not shave (including legs and underarms) for at least 48 hours prior to the first CHG shower.  You may shave your face/neck.  Please follow these instructions carefully:  1.  Shower with CHG Soap the night before surgery and the  morning of surgery.  2.  If you choose to wash your hair, wash your hair first as usual with your normal  shampoo.  3.  After you shampoo, rinse your hair and body thoroughly to remove the shampoo.                             4.  Use CHG as you would any other liquid soap.  You can apply chg directly to the skin and wash.  Gently with a scrungie or clean washcloth.  5.  Apply the CHG Soap to your body ONLY FROM THE NECK DOWN.   Do   not use on face/ open                           Wound or open sores. Avoid contact with eyes, ears mouth and   genitals (  private parts).                       Wash face,  Genitals (private parts) with your normal soap.             6.  Wash thoroughly, paying special attention to the area where your    surgery  will be performed.  7.  Thoroughly rinse your body with warm water from the neck down.  8.  DO NOT shower/wash with your normal soap after using and rinsing off the CHG Soap.                9.  Pat yourself dry with a clean towel.            10.  Wear clean pajamas.            11.  Place clean sheets on your bed the night of your first shower and do not  sleep with pets. Day of Surgery : Do not apply any lotions/deodorants the morning of surgery.  Please wear clean clothes to the hospital/surgery center.  FAILURE TO FOLLOW THESE INSTRUCTIONS MAY RESULT IN THE CANCELLATION OF YOUR SURGERY  PATIENT  SIGNATURE_________________________________  NURSE SIGNATURE__________________________________  ________________________________________________________________________    Barbara Russell  An incentive spirometer is a tool that can help keep your lungs clear and active. This tool measures how well you are filling your lungs with each breath. Taking long deep breaths may help reverse or decrease the chance of developing breathing (pulmonary) problems (especially infection) following: A long period of time when you are unable to move or be active. BEFORE THE PROCEDURE  If the spirometer includes an indicator to show your best effort, your nurse or respiratory therapist will set it to a desired goal. If possible, sit up straight or lean slightly forward. Try not to slouch. Hold the incentive spirometer in an upright position. INSTRUCTIONS FOR USE  Sit on the edge of your bed if possible, or sit up as far as you can in bed or on a chair. Hold the incentive spirometer in an upright position. Breathe out normally. Place the mouthpiece in your mouth and seal your lips tightly around it. Breathe in slowly and as deeply as possible, raising the piston or the ball toward the top of the column. Hold your breath for 3-5 seconds or for as long as possible. Allow the piston or ball to fall to the bottom of the column. Remove the mouthpiece from your mouth and breathe out normally. Rest for a few seconds and repeat Steps 1 through 7 at least 10 times every 1-2 hours when you are awake. Take your time and take a few normal breaths between deep breaths. The spirometer may include an indicator to show your best effort. Use the indicator as a goal to work toward during each repetition. After each set of 10 deep breaths, practice coughing to be sure your lungs are clear. If you have an incision (the cut made at the time of surgery), support your incision when coughing by placing a pillow or rolled up towels  firmly against it. Once you are able to get out of bed, walk around indoors and cough well. You may stop using the incentive spirometer when instructed by your caregiver.  RISKS AND COMPLICATIONS Take your time so you do not get dizzy or light-headed. If you are in pain, you may need to take or ask for pain medication before doing incentive spirometry.  It is harder to take a deep breath if you are having pain. AFTER USE Rest and breathe slowly and easily. It can be helpful to keep track of a log of your progress. Your caregiver can provide you with a simple table to help with this. If you are using the spirometer at home, follow these instructions: [place] IF:  You are having difficultly using the spirometer. You have trouble using the spirometer as often as instructed. Your pain medication is not giving enough relief while using the spirometer. You develop fever of 100.5 F (38.1 C) or higher. SEEK IMMEDIATE MEDICAL CARE IF:  You cough up bloody sputum that had not been present before. You develop fever of 102 F (38.9 C) or greater. You develop worsening pain at or near the incision site. MAKE SURE YOU:  Understand these instructions. Will watch your condition. Will get help right away if you are not doing well or get worse. Document Released: 04/09/2007 Document Revised: 02/19/2012 Document Reviewed: 06/10/2007 [employer] Patient Information 2014 [employer], [place].   ________________________________________________________________________    WHAT IS A BLOOD TRANSFUSION? Blood Transfusion Information  A transfusion is the replacement of blood or some of its parts. Blood is made up of multiple cells which provide different functions. Red blood cells carry oxygen and are used for blood loss replacement. White blood cells fight against infection. Platelets control bleeding. Plasma helps clot blood. Other blood products are available for specialized needs, such as hemophilia  or other clotting disorders. BEFORE THE TRANSFUSION  Who gives blood for transfusions?  Healthy volunteers who are fully evaluated to make sure their blood is safe. This is blood bank blood. Transfusion therapy is the safest it has ever been in the practice of medicine. Before blood is taken from a donor, a complete history is taken to make sure that person has no history of diseases nor engages in risky social behavior (examples are intravenous drug use or sexual activity with multiple partners). The donor's travel history is screened to minimize risk of transmitting infections, such as malaria. The donated blood is tested for signs of infectious diseases, such as HIV and hepatitis. The blood is then tested to be sure it is compatible with you in order to minimize the chance of a transfusion reaction. If you or a relative donates blood, this is often done in anticipation of surgery and is not appropriate for emergency situations. It takes many days to process the donated blood. RISKS AND COMPLICATIONS Although transfusion therapy is very safe and saves many lives, the main dangers of transfusion include:  Getting an infectious disease. Developing a transfusion reaction. This is an allergic reaction to something in the blood you were given. Every precaution is taken to prevent this. The decision to have a blood transfusion has been considered carefully by your caregiver before blood is given. Blood is not given unless the benefits outweigh the risks. AFTER THE TRANSFUSION Right after receiving a blood transfusion, you will usually feel much better and more energetic. This is especially true if your red blood cells have gotten low (anemic). The transfusion raises the level of the red blood cells which carry oxygen, and this usually causes an energy increase. The nurse administering the transfusion will monitor you carefully for complications. HOME CARE INSTRUCTIONS  No special instructions are needed  after a transfusion. You may find your energy is better. Speak with your caregiver about any limitations on activity for underlying diseases you may have. SEEK MEDICAL CARE IF:  Your condition is not improving after your transfusion. You develop redness or irritation at the intravenous (IV) site. SEEK IMMEDIATE MEDICAL CARE IF:  Any of the following symptoms occur over the next 12 hours: Shaking chills. You have a temperature by mouth above 102 F (38.9 C), not controlled by medicine. Chest, back, or muscle pain. People around you feel you are not acting correctly or are confused. Shortness of breath or difficulty breathing. Dizziness and fainting. You get a rash or develop hives. You have a decrease in urine output. Your urine turns a dark color or changes to pink, red, or brown. Any of the following symptoms occur over the next 10 days: You have a temperature by mouth above 102 F (38.9 C), not controlled by medicine. Shortness of breath. Weakness after normal activity. The white part of the eye turns yellow (jaundice). You have a decrease in the amount of urine or are urinating less often. Your urine turns a dark color or changes to pink, red, or brown. Document Released: 11/24/2000 Document Revised: 02/19/2012 Document Reviewed: 07/13/2008 [facility] Patient Information 2014 [place], [place].  _______________________________________________________________________

## 2022-05-11 NOTE — Telephone Encounter (Signed)
Spoke with patient and gave her Dr. Marlou Porch advice. States swelling got better.

## 2022-05-12 ENCOUNTER — Encounter (HOSPITAL_COMMUNITY): Payer: Self-pay

## 2022-05-12 ENCOUNTER — Other Ambulatory Visit: Payer: Self-pay

## 2022-05-12 ENCOUNTER — Encounter (HOSPITAL_COMMUNITY)
Admission: RE | Admit: 2022-05-12 | Discharge: 2022-05-12 | Disposition: A | Discharge status: Home / Self Care | Payer: Medicare Other | Source: Ambulatory Visit | Attending: Orthopedic Surgery | Admitting: Orthopedic Surgery

## 2022-05-12 DIAGNOSIS — I129 Hypertensive chronic kidney disease with stage 1 through stage 4 chronic kidney disease, or unspecified chronic kidney disease: Secondary | ICD-10-CM | POA: Insufficient documentation

## 2022-05-12 DIAGNOSIS — N189 Chronic kidney disease, unspecified: Secondary | ICD-10-CM | POA: Diagnosis not present

## 2022-05-12 DIAGNOSIS — I2510 Atherosclerotic heart disease of native coronary artery without angina pectoris: Secondary | ICD-10-CM | POA: Insufficient documentation

## 2022-05-12 DIAGNOSIS — T84022A Instability of internal right knee prosthesis, initial encounter: Secondary | ICD-10-CM | POA: Diagnosis not present

## 2022-05-12 DIAGNOSIS — E1122 Type 2 diabetes mellitus with diabetic chronic kidney disease: Secondary | ICD-10-CM | POA: Diagnosis not present

## 2022-05-12 DIAGNOSIS — Z01812 Encounter for preprocedural laboratory examination: Secondary | ICD-10-CM | POA: Insufficient documentation

## 2022-05-12 DIAGNOSIS — I350 Nonrheumatic aortic (valve) stenosis: Secondary | ICD-10-CM | POA: Insufficient documentation

## 2022-05-12 DIAGNOSIS — Z01818 Encounter for other preprocedural examination: Secondary | ICD-10-CM

## 2022-05-12 DIAGNOSIS — E119 Type 2 diabetes mellitus without complications: Secondary | ICD-10-CM

## 2022-05-12 HISTORY — DX: Nonrheumatic aortic (valve) stenosis: I35.0

## 2022-05-12 LAB — GLUCOSE, CAPILLARY: Glucose-Capillary: 104 mg/dL — ABNORMAL HIGH (ref 70–99)

## 2022-05-12 LAB — SURGICAL PCR SCREEN
MRSA, PCR: NEGATIVE
Staphylococcus aureus: NEGATIVE

## 2022-05-12 NOTE — Patient Instructions (Signed)
DUE TO COVID-19 ONLY TWO VISITORS  (aged 86 and older)  IS ALLOWED TO COME WITH YOU AND STAY IN THE WAITING ROOM ONLY DURING PRE OP AND PROCEDURE.   **NO VISITORS ARE ALLOWED IN THE SHORT STAY AREA OR RECOVERY ROOM!!**  IF YOU WILL BE ADMITTED INTO THE HOSPITAL YOU ARE ALLOWED ONLY FOUR SUPPORT PEOPLE DURING VISITATION HOURS ONLY (7 AM -8PM)   The support person(s) must pass our screening, gel in and out Visitors GUEST BADGE MUST BE WORN VISIBLY  One adult visitor may remain with you overnight and MUST be in the room by 8 P.M.   You are not required to [employer] often Do NOT share personal items Notify your provider if you are in close contact with someone who has COVID or you develop fever 100.4 or greater, new onset of sneezing, cough, sore throat, shortness of breath or body aches.        Your procedure is scheduled on:  May 17, 2022    Report to [facility] Main Entrance    Report to admitting at  1:00 PM   Call this number if you have problems the morning of surgery [phone]   Do not eat food :After Midnight.   After Midnight you may have the following liquids until 12:45 PM DAY OF SURGERY  Clear Liquid Diet Water Black Coffee (sugar ok, NO MILK/CREAM OR CREAMERS)  Tea (sugar ok, NO MILK/CREAM OR CREAMERS) regular and decaf                             Plain Jell-O (NO RED)                                           Fruit ices (not with fruit pulp, NO RED)                                     Popsicles (NO RED)                                                                  Juice: apple, WHITE grape, WHITE cranberry Sports drinks like Gatorade (NO RED) Clear broth(vegetable,chicken,beef)                       If you have questions, please contact your surgeon's office.   FOLLOW ANY ADDITIONAL PRE OP INSTRUCTIONS YOU RECEIVED FROM YOUR SURGEON'S OFFICE!!!     Oral Hygiene is also important to reduce your risk of infection.                                     Remember - BRUSH YOUR TEETH THE MORNING OF SURGERY WITH YOUR REGULAR TOOTHPASTE   Do NOT smoke after Midnight   Take these medicines the morning of surgery with A SIP OF WATER: Cetririzine, Hydralazine, Isosorbide, Ondansetron, Systane eye drops  You may not have any metal on your body including hair pins, jewelry, and body piercing             Do not wear make-up, lotions, powders, perfumes or deodorant  Do not wear nail polish including gel and S&S, artificial/acrylic nails, or any other type of covering on natural nails including finger and toenails. If you have artificial nails, gel coating, etc. that needs to be removed by a nail salon please have this removed prior to surgery or surgery may need to be canceled/ delayed if the surgeon/ anesthesia feels like they are unable to be safely monitored.   Do not shave  48 hours prior to surgery.               Do not bring valuables to the hospital. [place].   Contacts, dentures or bridgework may not be worn into surgery.   Bring small overnight bag day of surgery.   Please read over the following fact sheets you were given: IF YOU HAVE QUESTIONS ABOUT YOUR PRE-OP INSTRUCTIONS PLEASE CALL [facility] - Preparing for Surgery Before surgery, you can play an important role.  Because skin is not sterile, your skin needs to be as free of germs as possible.  You can reduce the number of germs on your skin by washing with CHG (chlorahexidine gluconate) soap before surgery.  CHG is an antiseptic cleaner which kills germs and bonds with the skin to continue killing germs even after washing. Please DO NOT use if you have an allergy to CHG or antibacterial soaps.  If your skin becomes reddened/irritated stop using the CHG and inform your nurse when you arrive at Short Stay. Do not shave (including legs and underarms) for at least 48 hours prior to the first  CHG shower.  You may shave your face/neck.  Please follow these instructions carefully:  1.  Shower with CHG Soap the night before surgery and the  morning of surgery.  2.  If you choose to wash your hair, wash your hair first as usual with your normal  shampoo.  3.  After you shampoo, rinse your hair and body thoroughly to remove the shampoo.                             4.  Use CHG as you would any other liquid soap.  You can apply chg directly to the skin and wash.  Gently with a scrungie or clean washcloth.  5.  Apply the CHG Soap to your body ONLY FROM THE NECK DOWN.   Do   not use on face/ open                           Wound or open sores. Avoid contact with eyes, ears mouth and   genitals (private parts).                       Wash face,  Genitals (private parts) with your normal soap.             6.  Wash thoroughly, paying special attention to the area where your    surgery  will be performed.  7.  Thoroughly rinse your body with warm water from the neck down.  8.  DO NOT shower/wash with your normal soap after  using and rinsing off the CHG Soap.                9.  Pat yourself dry with a clean towel.            10.  Wear clean pajamas.            11.  Place clean sheets on your bed the night of your first shower and do not  sleep with pets. Day of Surgery : Do not apply any lotions/deodorants the morning of surgery.  Please wear clean clothes to the hospital/surgery center.  FAILURE TO FOLLOW THESE INSTRUCTIONS MAY RESULT IN THE CANCELLATION OF YOUR SURGERY  PATIENT SIGNATURE_________________________________  NURSE SIGNATURE__________________________________  ________________________________________________________________________    Adam Phenix  An incentive spirometer is a tool that can help keep your lungs clear and active. This tool measures how well you are filling your lungs with each breath. Taking long deep breaths may help reverse or decrease the chance of  developing breathing (pulmonary) problems (especially infection) following: A long period of time when you are unable to move or be active. BEFORE THE PROCEDURE  If the spirometer includes an indicator to show your best effort, your nurse or respiratory therapist will set it to a desired goal. If possible, sit up straight or lean slightly forward. Try not to slouch. Hold the incentive spirometer in an upright position. INSTRUCTIONS FOR USE  Sit on the edge of your bed if possible, or sit up as far as you can in bed or on a chair. Hold the incentive spirometer in an upright position. Breathe out normally. Place the mouthpiece in your mouth and seal your lips tightly around it. Breathe in slowly and as deeply as possible, raising the piston or the ball toward the top of the column. Hold your breath for 3-5 seconds or for as long as possible. Allow the piston or ball to fall to the bottom of the column. Remove the mouthpiece from your mouth and breathe out normally. Rest for a few seconds and repeat Steps 1 through 7 at least 10 times every 1-2 hours when you are awake. Take your time and take a few normal breaths between deep breaths. The spirometer may include an indicator to show your best effort. Use the indicator as a goal to work toward during each repetition. After each set of 10 deep breaths, practice coughing to be sure your lungs are clear. If you have an incision (the cut made at the time of surgery), support your incision when coughing by placing a pillow or rolled up towels firmly against it. Once you are able to get out of bed, walk around indoors and cough well. You may stop using the incentive spirometer when instructed by your caregiver.  RISKS AND COMPLICATIONS Take your time so you do not get dizzy or light-headed. If you are in pain, you may need to take or ask for pain medication before doing incentive spirometry. It is harder to take a deep breath if you are having pain. AFTER  USE Rest and breathe slowly and easily. It can be helpful to keep track of a log of your progress. Your caregiver can provide you with a simple table to help with this. If you are using the spirometer at home, follow these instructions: [place] IF:  You are having difficultly using the spirometer. You have trouble using the spirometer as often as instructed. Your pain medication is not giving enough relief while using the  spirometer. You develop fever of 100.5 F (38.1 C) or higher. SEEK IMMEDIATE MEDICAL CARE IF:  You cough up bloody sputum that had not been present before. You develop fever of 102 F (38.9 C) or greater. You develop worsening pain at or near the incision site. MAKE SURE YOU:  Understand these instructions. Will watch your condition. Will get help right away if you are not doing well or get worse. Document Released: 04/09/2007 Document Revised: 02/19/2012 Document Reviewed: 06/10/2007 Exit[employer] Patient Information 2014 ExitCare, [place].   ________________________________________________________________________    WHAT IS A BLOOD TRANSFUSION? Blood Transfusion Information  A transfusion is the replacement of blood or some of its parts. Blood is made up of multiple cells which provide different functions. Red blood cells carry oxygen and are used for blood loss replacement. White blood cells fight against infection. Platelets control bleeding. Plasma helps clot blood. Other blood products are available for specialized needs, such as hemophilia or other clotting disorders. BEFORE THE TRANSFUSION  Who gives blood for transfusions?  Healthy volunteers who are fully evaluated to make sure their blood is safe. This is blood bank blood. Transfusion therapy is the safest it has ever been in the practice of medicine. Before blood is taken from a donor, a complete history is taken to make sure that person has no history of diseases nor engages in risky social  behavior (examples are intravenous drug use or sexual activity with multiple partners). The donor's travel history is screened to minimize risk of transmitting infections, such as malaria. The donated blood is tested for signs of infectious diseases, such as HIV and hepatitis. The blood is then tested to be sure it is compatible with you in order to minimize the chance of a transfusion reaction. If you or a relative donates blood, this is often done in anticipation of surgery and is not appropriate for emergency situations. It takes many days to process the donated blood. RISKS AND COMPLICATIONS Although transfusion therapy is very safe and saves many lives, the main dangers of transfusion include:  Getting an infectious disease. Developing a transfusion reaction. This is an allergic reaction to something in the blood you were given. Every precaution is taken to prevent this. The decision to have a blood transfusion has been considered carefully by your caregiver before blood is given. Blood is not given unless the benefits outweigh the risks. AFTER THE TRANSFUSION Right after receiving a blood transfusion, you will usually feel much better and more energetic. This is especially true if your red blood cells have gotten low (anemic). The transfusion raises the level of the red blood cells which carry oxygen, and this usually causes an energy increase. The nurse administering the transfusion will monitor you carefully for complications. HOME CARE INSTRUCTIONS  No special instructions are needed after a transfusion. You may find your energy is better. Speak with your caregiver about any limitations on activity for underlying diseases you may have. SEEK MEDICAL CARE IF:  Your condition is not improving after your transfusion. You develop redness or irritation at the intravenous (IV) site. SEEK IMMEDIATE MEDICAL CARE IF:  Any of the following symptoms occur over the next 12 hours: Shaking chills. You  have a temperature by mouth above 102 F (38.9 C), not controlled by medicine. Chest, back, or muscle pain. People around you feel you are not acting correctly or are confused. Shortness of breath or difficulty breathing. Dizziness and fainting. You get a rash or develop hives. You have a decrease in  urine output. Your urine turns a dark color or changes to pink, red, or brown. Any of the following symptoms occur over the next 10 days: You have a temperature by mouth above 102 F (38.9 C), not controlled by medicine. Shortness of breath. Weakness after normal activity. The white part of the eye turns yellow (jaundice). You have a decrease in the amount of urine or are urinating less often. Your urine turns a dark color or changes to pink, red, or brown. Document Released: 11/24/2000 Document Revised: 02/19/2012 Document Reviewed: 07/13/2008 [facility] Patient Information 2014 [place], [place].  _______________________________________________________________________

## 2022-05-12 NOTE — Progress Notes (Signed)
COVID Vaccine Completed:  Yes   Date of COVID positive in last 90 days:  No   PCP - Seward Carol, MD (note on chart) Cardiologist - Candee Furbish, MD    Cardiac Clearance in Epic 04-18-22 by Dr. Marlou Porch   Chest x-ray - Yes at PCP EKG - 04-18-22  Epic Stress Test - 04-05-20 Epic ECHO - 08-25-21  Epic Cardiac Cath - N/A Pacemaker/ICD device last checked: Spinal Cord Stimulator:   Bowel Prep -    Sleep Study - N/A CPAP -    Fasting Blood Sugar - low 100 range Checks Blood Sugar -  3  times a week    Blood Thinner Instructions: Aspirin Instructions: ASA '81mg'$ .   Last Dose:05-09-22   Activity level:   Unable to go up a flight of stairs due to knee pain.  Able to perform activities of daily living without stopping and without symptoms of chest pain or shortness of breath.  Patient lives alone in independent living.                                             Anesthesia review: Aortic stenosis, murmur, CAD, Hx MI, Angina, HTN, DM, CKD   Patient denies shortness of breath, fever, cough and chest pain at PAT appointment    Patient verbalized understanding of instructions that were given to them at the PAT appointment. Patient was also instructed that they will need to review over the PAT instructions again at home before surgery.

## 2022-05-15 ENCOUNTER — Encounter (HOSPITAL_COMMUNITY): Payer: Self-pay

## 2022-05-15 NOTE — Progress Notes (Signed)
Anesthesia Chart Review:   Case: [id number] Date/Time: 05/17/22 1535   Procedure: Right knee polyethylene versus total knee arthroplasty revision (Right: Knee)   Anesthesia type: Choice   Pre-op diagnosis: unstable right total knee arthroplasty   Location: [facility] ROOM 10 / [facility]   Surgeons: Gaynelle Arabian, MD       DISCUSSION: Pt is 88 years old with hx CAD (documented at remote CAD), aortic stenosis (moderate by 08/25/21 echo; repeat echo in 1 year), HTN, DM, CKD  VS: BP (!) 157/85   Pulse 62   Temp 36.8 C (Oral)   Resp 20   Ht '5\' 2"'$  (1.575 m)   Wt 60.3 kg   LMP  (LMP Unknown)   SpO2 99%   BMI 24.33 kg/m   PROVIDERS: - PCP is Seward Carol, MD. At last office visit 04/26/22, he notes "patient now ready for surgery, there are no contraindications to her proceeding" - Cardiologist is Candee Furbish, MD who cleared pt for surgery at last office visit 04/18/22 at "low to moderate overall cardiac risk mainly driven by age"  LABS:  Labs reviewed: Acceptable for surgery. - HbA1c 04/26/22 at PCP's office: 6.4 - CMP 04/26/22 at PCP's office: glucose 108, BUN 40,chloride 97, calcium 10.52, alp 131 - CBC w/diff 04/26/22 at PCP's office: RBC 3.88, H/H 10/19/34.4  (all labs ordered are listed, but only abnormal results are displayed)  Labs Reviewed  GLUCOSE, CAPILLARY - Abnormal; Notable for the following components:      Result Value   Glucose-Capillary 104 (*)    All other components within normal limits  SURGICAL PCR SCREEN  TYPE AND SCREEN     IMAGES: CXR 12/15/21:  - Chronic lung changes without evidence of acute cardiopulmonary disease    EKG 04/18/22: NSR. poor R wave progression. LAD. prior inferior infarct pattern. Unchanged   CV: Echo 08/25/21:  1. Left ventricular ejection fraction by 3D volume is 70 %. The left ventricle has hyperdynamic function. The left ventricle has no regional wall motion abnormalities. Left ventricular diastolic parameters are consistent with Grade I  diastolic dysfunction (impaired relaxation). Elevated left atrial pressure. The average left ventricular global longitudinal strain is 23.7 %. The global longitudinal strain is normal.  2. Right ventricular systolic function is normal. The right ventricular size is normal. There is normal pulmonary artery systolic pressure.  3. Left atrial size was mild to moderately dilated.  4. The mitral valve is degenerative. Mild mitral valve regurgitation. No evidence of mitral stenosis. Severe mitral annular calcification.  5. The aortic valve is tricuspid. There is severe calcification of the aortic valve. There is severe thickening of the aortic valve. Aortic valve regurgitation is not visualized. Moderate to severe aortic valve stenosis.   Nuclear stress test 04/05/20:  Nuclear stress EF: 79%. No wall motion abnormalities detected There was no ST segment deviation noted during stress. This is a low risk study. There is no ischemia identified The study is normal.   Past Medical History:  Diagnosis Date   Anxiety    Aortic stenosis    moderate by 08/25/21 echo   Breast cancer ([place])    Hx of BC, s/p mastectomy on right 1999, s/p tamoxifen X1 year   Chest pain    EC Stress test 12/21/11 Low risk, no ischemia, normal EF   Chronic kidney disease    Coronary artery disease    Remote Hx of CAD, normal stress test 2009   Diabetes mellitus without complication ([place])    Diverticulitis  Dyspnea    GERD (gastroesophageal reflux disease)    No longer a problem   Hypercholesteremia    Hypertension    Insomnia    Lichen sclerosus    Low back pain    Myocardial infarction ([place]) 12/11/1998   Osteoarthrosis, unspecified whether generalized or localized, unspecified site    Pain    Several year Hx of pain/ SOB radiating to her neck   SOB (shortness of breath)    UTI (urinary tract infection)     Past Surgical History:  Procedure Laterality Date   ABDOMINAL HYSTERECTOMY     APPENDECTOMY     HAND  SURGERY Bilateral    CTS and trigger finger on right and left   MASTECTOMY Right 12/11/1997   REPLACEMENT TOTAL KNEE Right 11/10/2009   VESICOVAGINAL FISTULA CLOSURE W/ TAH  12/12/1967    MEDICATIONS:  aspirin 81 MG tablet   b complex vitamins tablet   Camphor-Menthol-Methyl Sal (SALONPAS) 3.12-16-08 % PTCH   cetirizine (ZYRTEC) 10 MG tablet   cholecalciferol (VITAMIN D) 1000 UNITS tablet   CRANBERRY PO   doxycycline (VIBRAMYCIN) 100 MG capsule   furosemide (LASIX) 20 MG tablet   gabapentin (NEURONTIN) 300 MG capsule   hydrALAZINE (APRESOLINE) 25 MG tablet   hydrochlorothiazide (HYDRODIURIL) 25 MG tablet   isosorbide mononitrate (IMDUR) 30 MG 24 hr tablet   losartan (COZAAR) 50 MG tablet   Multiple Vitamins-Minerals (PRESERVISION AREDS 2 PO)   nitroGLYCERIN (NITROSTAT) 0.4 MG SL tablet   Omega-3 Fatty Acids (FISH OIL) 1200 MG CAPS   ondansetron (ZOFRAN) 4 MG tablet   ONETOUCH ULTRA test strip   Polyethyl Glycol-Propyl Glycol (SYSTANE OP)   saccharomyces boulardii (FLORASTOR) 250 MG capsule   zolpidem (AMBIEN) 5 MG tablet   No current facility-administered medications for this encounter.    If no changes, I anticipate pt can proceed with surgery as scheduled.   Willeen Cass, PhD, FNP-BC [facility] Short Stay Surgical [facility]/Anesthesiology Phone: [phone] 05/15/2022 10:09 AM

## 2022-05-15 NOTE — Anesthesia Preprocedure Evaluation (Signed)
Anesthesia Evaluation  Patient identified by MRN, date of birth, ID band Patient awake    Reviewed: Allergy & Precautions, NPO status , Patient's Chart, lab work & pertinent test results  Airway Mallampati: II  TM Distance: >3 FB Neck ROM: Full    Dental no notable dental hx. (+) Teeth Intact, Dental Advisory Given   Pulmonary former smoker,    Pulmonary exam normal breath sounds clear to auscultation       Cardiovascular hypertension, + CAD and + Past MI  Normal cardiovascular exam+ Valvular Problems/Murmurs AS  Rhythm:Regular Rate:Normal  08/2021 TTE 1. Left ventricular ejection fraction by 3D volume is 70 %. The left  ventricle has hyperdynamic function. The left ventricle has no regional  wall motion abnormalities. Left ventricular diastolic parameters are  consistent with Grade I diastolic  dysfunction (impaired relaxation). Elevated left atrial pressure. The  average left ventricular global longitudinal strain is 23.7 %. The global  longitudinal strain is normal.  2. Right ventricular systolic function is normal. The right ventricular  size is normal. There is normal pulmonary artery systolic pressure.  3. Left atrial size was mild to moderately dilated.  4. The mitral valve is degenerative. Mild mitral valve regurgitation. No  evidence of mitral stenosis. Severe mitral annular calcification.  5. The aortic valve is tricuspid. There is severe calcification of the  aortic valve. There is severe thickening of the aortic valve. Aortic valve  regurgitation is not visualized. Moderate to severe aortic valve stenosis.   Comparison(s): Prior images reviewed side by side. Aortic stenosis is now  moderate to severe   Neuro/Psych Anxiety    GI/Hepatic   Endo/Other    Renal/GU      Musculoskeletal  (+) Arthritis , Osteoarthritis,    Abdominal   Peds  Hematology   Anesthesia Other Findings    Reproductive/Obstetrics                           Anesthesia Physical Anesthesia Plan  ASA: 3  Anesthesia Plan: Regional and Spinal   Post-op Pain Management: Regional block* and Minimal or no pain anticipated   Induction: Intravenous  PONV Risk Score and Plan: 3 and Treatment may vary due to age or medical condition and Ondansetron  Airway Management Planned: Natural Airway and Nasal Cannula  Additional Equipment: Arterial line  Intra-op Plan:   Post-operative Plan:   Informed Consent: I have reviewed the patients History and Physical, chart, labs and discussed the procedure including the risks, benefits and alternatives for the proposed anesthesia with the patient or authorized representative who has indicated his/her understanding and acceptance.     Dental advisory given  Plan Discussed with:   Anesthesia Plan Comments: (See APP note by Durel Salts, FNP )      Anesthesia Quick Evaluation

## 2022-05-17 ENCOUNTER — Encounter (HOSPITAL_COMMUNITY)
Admission: RE | Disposition: A | Discharge status: Skilled Nursing Facility | Payer: Self-pay | Source: Ambulatory Visit | Attending: Orthopedic Surgery

## 2022-05-17 ENCOUNTER — Encounter (HOSPITAL_COMMUNITY): Payer: Self-pay | Admitting: Orthopedic Surgery

## 2022-05-17 ENCOUNTER — Inpatient Hospital Stay (HOSPITAL_COMMUNITY): Payer: Medicare Other | Admitting: Emergency Medicine

## 2022-05-17 ENCOUNTER — Inpatient Hospital Stay (HOSPITAL_COMMUNITY)
Admission: RE | Admit: 2022-05-17 | Discharge: 2022-05-20 | DRG: 468 | Disposition: A | Discharge status: Skilled Nursing Facility | Payer: Medicare Other | Source: Ambulatory Visit | Attending: Orthopedic Surgery | Admitting: Orthopedic Surgery

## 2022-05-17 ENCOUNTER — Other Ambulatory Visit: Payer: Self-pay

## 2022-05-17 ENCOUNTER — Inpatient Hospital Stay (HOSPITAL_COMMUNITY): Payer: Medicare Other | Admitting: Anesthesiology

## 2022-05-17 DIAGNOSIS — I129 Hypertensive chronic kidney disease with stage 1 through stage 4 chronic kidney disease, or unspecified chronic kidney disease: Secondary | ICD-10-CM | POA: Diagnosis present

## 2022-05-17 DIAGNOSIS — M1990 Unspecified osteoarthritis, unspecified site: Secondary | ICD-10-CM | POA: Diagnosis present

## 2022-05-17 DIAGNOSIS — Z882 Allergy status to sulfonamides status: Secondary | ICD-10-CM | POA: Diagnosis not present

## 2022-05-17 DIAGNOSIS — E1122 Type 2 diabetes mellitus with diabetic chronic kidney disease: Secondary | ICD-10-CM | POA: Diagnosis present

## 2022-05-17 DIAGNOSIS — M8580 Other specified disorders of bone density and structure, unspecified site: Secondary | ICD-10-CM | POA: Diagnosis present

## 2022-05-17 DIAGNOSIS — T84012A Broken internal right knee prosthesis, initial encounter: Secondary | ICD-10-CM

## 2022-05-17 DIAGNOSIS — Z7401 Bed confinement status: Secondary | ICD-10-CM | POA: Diagnosis not present

## 2022-05-17 DIAGNOSIS — Z808 Family history of malignant neoplasm of other organs or systems: Secondary | ICD-10-CM | POA: Diagnosis not present

## 2022-05-17 DIAGNOSIS — Y792 Prosthetic and other implants, materials and accessory orthopedic devices associated with adverse incidents: Secondary | ICD-10-CM | POA: Diagnosis present

## 2022-05-17 DIAGNOSIS — I959 Hypotension, unspecified: Secondary | ICD-10-CM | POA: Diagnosis not present

## 2022-05-17 DIAGNOSIS — I350 Nonrheumatic aortic (valve) stenosis: Secondary | ICD-10-CM | POA: Diagnosis present

## 2022-05-17 DIAGNOSIS — Z885 Allergy status to narcotic agent status: Secondary | ICD-10-CM | POA: Diagnosis not present

## 2022-05-17 DIAGNOSIS — Y793 Surgical instruments, materials and orthopedic devices (including sutures) associated with adverse incidents: Secondary | ICD-10-CM | POA: Diagnosis present

## 2022-05-17 DIAGNOSIS — Z79899 Other long term (current) drug therapy: Secondary | ICD-10-CM | POA: Diagnosis not present

## 2022-05-17 DIAGNOSIS — Z888 Allergy status to other drugs, medicaments and biological substances status: Secondary | ICD-10-CM | POA: Diagnosis not present

## 2022-05-17 DIAGNOSIS — Z8249 Family history of ischemic heart disease and other diseases of the circulatory system: Secondary | ICD-10-CM

## 2022-05-17 DIAGNOSIS — Z880 Allergy status to penicillin: Secondary | ICD-10-CM | POA: Diagnosis not present

## 2022-05-17 DIAGNOSIS — Z7984 Long term (current) use of oral hypoglycemic drugs: Secondary | ICD-10-CM | POA: Diagnosis not present

## 2022-05-17 DIAGNOSIS — I10 Essential (primary) hypertension: Secondary | ICD-10-CM

## 2022-05-17 DIAGNOSIS — Z96659 Presence of unspecified artificial knee joint: Secondary | ICD-10-CM

## 2022-05-17 DIAGNOSIS — I2510 Atherosclerotic heart disease of native coronary artery without angina pectoris: Secondary | ICD-10-CM | POA: Diagnosis present

## 2022-05-17 DIAGNOSIS — I2583 Coronary atherosclerosis due to lipid rich plaque: Secondary | ICD-10-CM | POA: Diagnosis present

## 2022-05-17 DIAGNOSIS — G8918 Other acute postprocedural pain: Secondary | ICD-10-CM | POA: Diagnosis not present

## 2022-05-17 DIAGNOSIS — Z9011 Acquired absence of right breast and nipple: Secondary | ICD-10-CM | POA: Diagnosis not present

## 2022-05-17 DIAGNOSIS — T84022A Instability of internal right knee prosthesis, initial encounter: Principal | ICD-10-CM | POA: Diagnosis present

## 2022-05-17 DIAGNOSIS — T84018A Broken internal joint prosthesis, other site, initial encounter: Secondary | ICD-10-CM

## 2022-05-17 DIAGNOSIS — N182 Chronic kidney disease, stage 2 (mild): Secondary | ICD-10-CM | POA: Diagnosis present

## 2022-05-17 DIAGNOSIS — Z853 Personal history of malignant neoplasm of breast: Secondary | ICD-10-CM | POA: Diagnosis not present

## 2022-05-17 DIAGNOSIS — Z96651 Presence of right artificial knee joint: Secondary | ICD-10-CM | POA: Diagnosis not present

## 2022-05-17 DIAGNOSIS — I252 Old myocardial infarction: Secondary | ICD-10-CM

## 2022-05-17 DIAGNOSIS — Z87891 Personal history of nicotine dependence: Secondary | ICD-10-CM

## 2022-05-17 DIAGNOSIS — Z886 Allergy status to analgesic agent status: Secondary | ICD-10-CM

## 2022-05-17 DIAGNOSIS — Z7982 Long term (current) use of aspirin: Secondary | ICD-10-CM

## 2022-05-17 DIAGNOSIS — E7800 Pure hypercholesterolemia, unspecified: Secondary | ICD-10-CM | POA: Diagnosis present

## 2022-05-17 DIAGNOSIS — Z01818 Encounter for other preprocedural examination: Principal | ICD-10-CM

## 2022-05-17 DIAGNOSIS — E119 Type 2 diabetes mellitus without complications: Secondary | ICD-10-CM

## 2022-05-17 HISTORY — DX: Diverticulitis of intestine, part unspecified, without perforation or abscess without bleeding: K57.92

## 2022-05-17 HISTORY — DX: Urinary tract infection, site not specified: N39.0

## 2022-05-17 HISTORY — DX: Chronic kidney disease, unspecified: N18.9

## 2022-05-17 HISTORY — DX: Gastro-esophageal reflux disease without esophagitis: K21.9

## 2022-05-17 HISTORY — DX: Insomnia, unspecified: G47.00

## 2022-05-17 HISTORY — DX: Anxiety disorder, unspecified: F41.9

## 2022-05-17 HISTORY — PX: TOTAL KNEE REVISION: SHX996

## 2022-05-17 LAB — TYPE AND SCREEN
ABO/RH(D): O POS
Antibody Screen: NEGATIVE

## 2022-05-17 SURGERY — TOTAL KNEE REVISION
Anesthesia: Regional | Site: Knee | Laterality: Right

## 2022-05-17 MED ORDER — SODIUM CHLORIDE (PF) 0.9 % IJ SOLN
INTRAMUSCULAR | Status: DC | PRN
  Administered 2022-05-17: 60 mL via INTRAVENOUS

## 2022-05-17 MED ORDER — ALBUMIN HUMAN 5 % IV SOLN
INTRAVENOUS | Status: AC
  Administered 2022-05-17: 12.5 g
  Filled 2022-05-17: qty 500

## 2022-05-17 MED ORDER — METHOCARBAMOL 500 MG IVPB - SIMPLE MED: 500.0000 mg | Freq: Four times a day (QID) | INTRAVENOUS | Status: DC | PRN

## 2022-05-17 MED ORDER — SODIUM CHLORIDE (PF) 0.9 % IJ SOLN
INTRAMUSCULAR | Status: AC
Start: 2022-05-17 — End: ?
  Filled 2022-05-17: qty 10

## 2022-05-17 MED ORDER — NITROGLYCERIN 0.4 MG SL SUBL
0.4000 mg | SUBLINGUAL_TABLET | SUBLINGUAL | Status: DC | PRN
Start: 2022-05-17 — End: 2022-05-20

## 2022-05-17 MED ORDER — LACTATED RINGERS IV SOLN: INTRAVENOUS | Status: DC

## 2022-05-17 MED ORDER — DEXAMETHASONE SODIUM PHOSPHATE 10 MG/ML IJ SOLN
INTRAMUSCULAR | Status: AC
  Filled 2022-05-17: qty 1

## 2022-05-17 MED ORDER — FENTANYL CITRATE PF 50 MCG/ML IJ SOSY: 25.0000 ug | PREFILLED_SYRINGE | INTRAMUSCULAR | Status: DC | PRN

## 2022-05-17 MED ORDER — ONDANSETRON HCL 4 MG PO TABS: 4.0000 mg | ORAL_TABLET | Freq: Four times a day (QID) | ORAL | Status: DC | PRN

## 2022-05-17 MED ORDER — ONDANSETRON HCL 4 MG/2ML IJ SOLN
INTRAMUSCULAR | Status: DC | PRN
  Administered 2022-05-17: 4 mg via INTRAVENOUS

## 2022-05-17 MED ORDER — HYDROCODONE-ACETAMINOPHEN 5-325 MG PO TABS
1.0000 | ORAL_TABLET | Freq: Four times a day (QID) | ORAL | Status: DC | PRN
  Administered 2022-05-17: 2 via ORAL
  Administered 2022-05-18: 1 via ORAL
  Administered 2022-05-18: 2 via ORAL
  Administered 2022-05-18: 1 via ORAL
  Administered 2022-05-18 – 2022-05-20 (×6): 2 via ORAL
  Filled 2022-05-17 (×10): qty 2

## 2022-05-17 MED ORDER — LOSARTAN POTASSIUM 50 MG PO TABS
50.0000 mg | ORAL_TABLET | Freq: Two times a day (BID) | ORAL | Status: DC
  Administered 2022-05-18 – 2022-05-20 (×5): 50 mg via ORAL
  Filled 2022-05-17 (×5): qty 1

## 2022-05-17 MED ORDER — DEXAMETHASONE SODIUM PHOSPHATE 10 MG/ML IJ SOLN: 8.0000 mg | Freq: Once | INTRAMUSCULAR | Status: DC

## 2022-05-17 MED ORDER — ONDANSETRON HCL 4 MG/2ML IJ SOLN
INTRAMUSCULAR | Status: AC
  Filled 2022-05-17: qty 2

## 2022-05-17 MED ORDER — FLEET ENEMA 7-19 GM/118ML RE ENEM: 1.0000 | ENEMA | Freq: Once | RECTAL | Status: DC | PRN

## 2022-05-17 MED ORDER — ORAL CARE MOUTH RINSE: 15.0000 mL | Freq: Once | OROMUCOSAL | Status: AC

## 2022-05-17 MED ORDER — FENTANYL CITRATE (PF) 100 MCG/2ML IJ SOLN
INTRAMUSCULAR | Status: AC
  Filled 2022-05-17: qty 2

## 2022-05-17 MED ORDER — LIDOCAINE HCL (PF) 2 % IJ SOLN
INTRAMUSCULAR | Status: AC
  Filled 2022-05-17: qty 5

## 2022-05-17 MED ORDER — PHENOL 1.4 % MT LIQD: 1.0000 | OROMUCOSAL | Status: DC | PRN

## 2022-05-17 MED ORDER — MORPHINE SULFATE (PF) 2 MG/ML IV SOLN
1.0000 mg | INTRAVENOUS | Status: AC
  Administered 2022-05-17: 1 mg via INTRAVENOUS

## 2022-05-17 MED ORDER — POLYETHYLENE GLYCOL 3350 17 G PO PACK: 17.0000 g | PACK | Freq: Every day | ORAL | Status: DC | PRN

## 2022-05-17 MED ORDER — CEFAZOLIN SODIUM-DEXTROSE 2-4 GM/100ML-% IV SOLN
2.0000 g | INTRAVENOUS | Status: AC
  Administered 2022-05-17: 2 g via INTRAVENOUS
  Filled 2022-05-17: qty 100

## 2022-05-17 MED ORDER — PROPOFOL 10 MG/ML IV BOLUS
INTRAVENOUS | Status: AC
Start: 2022-05-17 — End: ?
  Filled 2022-05-17: qty 20

## 2022-05-17 MED ORDER — BUPIVACAINE IN DEXTROSE 0.75-8.25 % IT SOLN
INTRATHECAL | Status: DC | PRN
  Administered 2022-05-17: 11.25 mg via INTRATHECAL

## 2022-05-17 MED ORDER — ISOSORBIDE MONONITRATE ER 30 MG PO TB24
30.0000 mg | ORAL_TABLET | Freq: Every day | ORAL | Status: DC
  Administered 2022-05-18 – 2022-05-20 (×3): 30 mg via ORAL
  Filled 2022-05-17 (×3): qty 1

## 2022-05-17 MED ORDER — ALBUMIN HUMAN 5 % IV SOLN: 12.5000 g | Freq: Once | INTRAVENOUS | Status: DC

## 2022-05-17 MED ORDER — MORPHINE SULFATE (PF) 2 MG/ML IV SOLN
0.5000 mg | INTRAVENOUS | Status: DC | PRN
  Administered 2022-05-17 – 2022-05-18 (×4): 1 mg via INTRAVENOUS
  Filled 2022-05-17 (×4): qty 1

## 2022-05-17 MED ORDER — MIDAZOLAM HCL 2 MG/2ML IJ SOLN
INTRAMUSCULAR | Status: AC
  Filled 2022-05-17: qty 2

## 2022-05-17 MED ORDER — MENTHOL 3 MG MT LOZG: 1.0000 | LOZENGE | OROMUCOSAL | Status: DC | PRN

## 2022-05-17 MED ORDER — SODIUM CHLORIDE 0.9 % IV SOLN: INTRAVENOUS | Status: DC

## 2022-05-17 MED ORDER — HYDROCODONE-ACETAMINOPHEN 5-325 MG PO TABS: 1.0000 | ORAL_TABLET | Freq: Four times a day (QID) | ORAL | Status: DC | PRN

## 2022-05-17 MED ORDER — FENTANYL CITRATE PF 50 MCG/ML IJ SOSY
50.0000 ug | PREFILLED_SYRINGE | Freq: Once | INTRAMUSCULAR | Status: AC
  Administered 2022-05-17: 25 ug via INTRAVENOUS
  Filled 2022-05-17: qty 2

## 2022-05-17 MED ORDER — RIVAROXABAN 10 MG PO TABS
10.0000 mg | ORAL_TABLET | Freq: Every day | ORAL | Status: DC
  Administered 2022-05-18 – 2022-05-20 (×3): 10 mg via ORAL
  Filled 2022-05-17 (×3): qty 1

## 2022-05-17 MED ORDER — ONDANSETRON HCL 4 MG/2ML IJ SOLN
4.0000 mg | Freq: Four times a day (QID) | INTRAMUSCULAR | Status: DC | PRN
  Administered 2022-05-18 (×3): 4 mg via INTRAVENOUS
  Filled 2022-05-17 (×3): qty 2

## 2022-05-17 MED ORDER — HYDROCHLOROTHIAZIDE 25 MG PO TABS
25.0000 mg | ORAL_TABLET | Freq: Every day | ORAL | Status: DC
  Administered 2022-05-18 – 2022-05-20 (×3): 25 mg via ORAL
  Filled 2022-05-17 (×3): qty 1

## 2022-05-17 MED ORDER — CEFAZOLIN SODIUM-DEXTROSE 2-4 GM/100ML-% IV SOLN
2.0000 g | Freq: Four times a day (QID) | INTRAVENOUS | Status: AC
  Administered 2022-05-17 – 2022-05-18 (×2): 2 g via INTRAVENOUS
  Filled 2022-05-17 (×2): qty 100

## 2022-05-17 MED ORDER — ACETAMINOPHEN 325 MG PO TABS
325.0000 mg | ORAL_TABLET | Freq: Four times a day (QID) | ORAL | Status: DC | PRN
  Filled 2022-05-17: qty 2

## 2022-05-17 MED ORDER — HYDRALAZINE HCL 25 MG PO TABS: 25.0000 mg | ORAL_TABLET | Freq: Every day | ORAL | Status: DC | PRN

## 2022-05-17 MED ORDER — BUPIVACAINE LIPOSOME 1.3 % IJ SUSP: 20.0000 mL | Freq: Once | INTRAMUSCULAR | Status: DC

## 2022-05-17 MED ORDER — TRANEXAMIC ACID-NACL 1000-0.7 MG/100ML-% IV SOLN
1000.0000 mg | INTRAVENOUS | Status: AC
  Administered 2022-05-17: 1000 mg via INTRAVENOUS
  Filled 2022-05-17: qty 100

## 2022-05-17 MED ORDER — POVIDONE-IODINE 10 % EX SWAB
2.0000 "application " | Freq: Once | CUTANEOUS | Status: AC
  Administered 2022-05-17: 2 via TOPICAL

## 2022-05-17 MED ORDER — ACETAMINOPHEN 10 MG/ML IV SOLN
1000.0000 mg | Freq: Four times a day (QID) | INTRAVENOUS | Status: DC
  Administered 2022-05-17: 1000 mg via INTRAVENOUS
  Filled 2022-05-17: qty 100

## 2022-05-17 MED ORDER — ROPIVACAINE HCL 5 MG/ML IJ SOLN
INTRAMUSCULAR | Status: DC | PRN
  Administered 2022-05-17: 25 mL via PERINEURAL

## 2022-05-17 MED ORDER — ACETAMINOPHEN 10 MG/ML IV SOLN: 1000.0000 mg | Freq: Once | INTRAVENOUS | Status: DC | PRN

## 2022-05-17 MED ORDER — MORPHINE SULFATE (PF) 2 MG/ML IV SOLN
INTRAVENOUS | Status: AC
  Filled 2022-05-17: qty 1

## 2022-05-17 MED ORDER — BUPIVACAINE LIPOSOME 1.3 % IJ SUSP
INTRAMUSCULAR | Status: DC | PRN
  Administered 2022-05-17: 20 mL

## 2022-05-17 MED ORDER — METHOCARBAMOL 500 MG IVPB - SIMPLE MED
INTRAVENOUS | Status: AC
  Administered 2022-05-17: 500 mg via INTRAVENOUS
  Filled 2022-05-17: qty 50

## 2022-05-17 MED ORDER — METOCLOPRAMIDE HCL 5 MG/ML IJ SOLN
5.0000 mg | Freq: Three times a day (TID) | INTRAMUSCULAR | Status: DC | PRN
  Administered 2022-05-18: 10 mg via INTRAVENOUS
  Filled 2022-05-17: qty 2

## 2022-05-17 MED ORDER — BISACODYL 10 MG RE SUPP
10.0000 mg | Freq: Every day | RECTAL | Status: DC | PRN
  Administered 2022-05-18: 10 mg via RECTAL
  Filled 2022-05-17: qty 1

## 2022-05-17 MED ORDER — PROPOFOL 1000 MG/100ML IV EMUL
INTRAVENOUS | Status: AC
  Filled 2022-05-17: qty 100

## 2022-05-17 MED ORDER — BUPIVACAINE LIPOSOME 1.3 % IJ SUSP
INTRAMUSCULAR | Status: AC
  Filled 2022-05-17: qty 20

## 2022-05-17 MED ORDER — SODIUM CHLORIDE (PF) 0.9 % IJ SOLN
INTRAMUSCULAR | Status: AC
  Filled 2022-05-17: qty 50

## 2022-05-17 MED ORDER — FUROSEMIDE 20 MG PO TABS
10.0000 mg | ORAL_TABLET | Freq: Every day | ORAL | Status: DC
  Administered 2022-05-18 – 2022-05-20 (×3): 10 mg via ORAL
  Filled 2022-05-17 (×3): qty 1

## 2022-05-17 MED ORDER — ONDANSETRON HCL 4 MG/2ML IJ SOLN: 4.0000 mg | Freq: Once | INTRAMUSCULAR | Status: DC | PRN

## 2022-05-17 MED ORDER — DOCUSATE SODIUM 100 MG PO CAPS
100.0000 mg | ORAL_CAPSULE | Freq: Two times a day (BID) | ORAL | Status: DC
  Administered 2022-05-18 – 2022-05-19 (×2): 100 mg via ORAL
  Filled 2022-05-17 (×6): qty 1

## 2022-05-17 MED ORDER — DEXAMETHASONE SODIUM PHOSPHATE 10 MG/ML IJ SOLN
10.0000 mg | Freq: Once | INTRAMUSCULAR | Status: AC
  Administered 2022-05-18: 10 mg via INTRAVENOUS
  Filled 2022-05-17: qty 1

## 2022-05-17 MED ORDER — METOCLOPRAMIDE HCL 5 MG PO TABS: 5.0000 mg | ORAL_TABLET | Freq: Three times a day (TID) | ORAL | Status: DC | PRN

## 2022-05-17 MED ORDER — GABAPENTIN 300 MG PO CAPS
600.0000 mg | ORAL_CAPSULE | Freq: Every day | ORAL | Status: DC
  Administered 2022-05-18 – 2022-05-19 (×2): 600 mg via ORAL
  Filled 2022-05-17 (×2): qty 2

## 2022-05-17 MED ORDER — METHOCARBAMOL 500 MG PO TABS
500.0000 mg | ORAL_TABLET | Freq: Four times a day (QID) | ORAL | Status: DC | PRN
  Administered 2022-05-18 – 2022-05-20 (×4): 500 mg via ORAL
  Filled 2022-05-17 (×4): qty 1

## 2022-05-17 MED ORDER — CHLORHEXIDINE GLUCONATE 0.12 % MT SOLN
15.0000 mL | Freq: Once | OROMUCOSAL | Status: AC
  Administered 2022-05-17: 15 mL via OROMUCOSAL

## 2022-05-17 MED ORDER — PROPOFOL 500 MG/50ML IV EMUL
INTRAVENOUS | Status: DC | PRN
  Administered 2022-05-17: 50 ug/kg/min via INTRAVENOUS

## 2022-05-17 SURGICAL SUPPLY — 62 items
BAG COUNTER SPONGE SURGICOUNT (BAG) IMPLANT
BAG DECANTER FOR FLEXI CONT (MISCELLANEOUS) ×2 IMPLANT
BAG ZIPLOCK 12X15 (MISCELLANEOUS) IMPLANT
BLADE SAG 18X100X1.27 (BLADE) ×2 IMPLANT
BLADE SAW SGTL 11.0X1.19X90.0M (BLADE) ×2 IMPLANT
BLADE SURG SZ10 CARB STEEL (BLADE) IMPLANT
BNDG ELASTIC 6X5.8 VLCR STR LF (GAUZE/BANDAGES/DRESSINGS) ×2 IMPLANT
CLSR STERI-STRIP ANTIMIC 1/2X4 (GAUZE/BANDAGES/DRESSINGS) ×1 IMPLANT
COVER SURGICAL LIGHT HANDLE (MISCELLANEOUS) ×2 IMPLANT
CUFF TOURN SGL QUICK 34 (TOURNIQUET CUFF) ×2
CUFF TRNQT CYL 34X4.125X (TOURNIQUET CUFF) ×1 IMPLANT
DRAPE INCISE IOBAN 66X45 STRL (DRAPES) ×2 IMPLANT
DRAPE U-SHAPE 47X51 STRL (DRAPES) ×2 IMPLANT
DRSG ADAPTIC 3X8 NADH LF (GAUZE/BANDAGES/DRESSINGS) ×2 IMPLANT
DRSG AQUACEL AG ADV 3.5X10 (GAUZE/BANDAGES/DRESSINGS) ×1 IMPLANT
DRSG PAD ABDOMINAL 8X10 ST (GAUZE/BANDAGES/DRESSINGS) ×2 IMPLANT
DURAPREP 26ML APPLICATOR (WOUND CARE) ×2 IMPLANT
ELECT REM PT RETURN 15FT ADLT (MISCELLANEOUS) ×2 IMPLANT
EVACUATOR 1/8 PVC DRAIN (DRAIN) IMPLANT
GAUZE SPONGE 4X4 12PLY STRL (GAUZE/BANDAGES/DRESSINGS) ×2 IMPLANT
GLOVE BIO SURGEON STRL SZ 6.5 (GLOVE) ×4 IMPLANT
GLOVE BIO SURGEON STRL SZ7 (GLOVE) ×2 IMPLANT
GLOVE BIO SURGEON STRL SZ8 (GLOVE) ×4 IMPLANT
GLOVE BIOGEL PI IND STRL 7.0 (GLOVE) ×1 IMPLANT
GLOVE BIOGEL PI IND STRL 8 (GLOVE) ×1 IMPLANT
GLOVE BIOGEL PI IND STRL 8.5 (GLOVE) IMPLANT
GLOVE BIOGEL PI INDICATOR 7.0 (GLOVE) ×1
GLOVE BIOGEL PI INDICATOR 8 (GLOVE) ×1
GLOVE BIOGEL PI INDICATOR 8.5 (GLOVE)
GOWN SPEC L4 XLG W/TWL (GOWN DISPOSABLE) ×4 IMPLANT
GOWN STRL REUS W/ TWL LRG LVL3 (GOWN DISPOSABLE) ×2 IMPLANT
GOWN STRL REUS W/TWL LRG LVL3 (GOWN DISPOSABLE) ×4
HANDPIECE INTERPULSE COAX TIP (DISPOSABLE) ×2
HOLDER FOLEY CATH W/STRAP (MISCELLANEOUS) IMPLANT
IMMOBILIZER KNEE 20 (SOFTGOODS) ×2
IMMOBILIZER KNEE 20 THIGH 36 (SOFTGOODS) ×1 IMPLANT
INSERT TIBIAL PFC 3 20.0 (Knees) ×1 IMPLANT
KIT TURNOVER KIT A (KITS) IMPLANT
MANIFOLD NEPTUNE II (INSTRUMENTS) ×2 IMPLANT
NS IRRIG 1000ML POUR BTL (IV SOLUTION) ×2 IMPLANT
PACK TOTAL KNEE CUSTOM (KITS) ×2 IMPLANT
PADDING CAST COTTON 6X4 STRL (CAST SUPPLIES) ×4 IMPLANT
PADDING CAST SYN 6 (CAST SUPPLIES) ×1
PADDING CAST SYNTHETIC 6X4 NS (CAST SUPPLIES) IMPLANT
PROTECTOR NERVE ULNAR (MISCELLANEOUS) ×2 IMPLANT
SET HNDPC FAN SPRY TIP SCT (DISPOSABLE) ×1 IMPLANT
SPIKE FLUID TRANSFER (MISCELLANEOUS) IMPLANT
STRIP CLOSURE SKIN 1/2X4 (GAUZE/BANDAGES/DRESSINGS) ×2 IMPLANT
SUT MNCRL AB 4-0 PS2 18 (SUTURE) ×2 IMPLANT
SUT STRATAFIX 0 PDS 27 VIOLET (SUTURE) ×2
SUT VIC AB 2-0 CT1 27 (SUTURE) ×6
SUT VIC AB 2-0 CT1 TAPERPNT 27 (SUTURE) ×3 IMPLANT
SUTURE STRATFX 0 PDS 27 VIOLET (SUTURE) ×1 IMPLANT
SWAB COLLECTION DEVICE MRSA (MISCELLANEOUS) IMPLANT
SWAB CULTURE ESWAB REG 1ML (MISCELLANEOUS) IMPLANT
SYR 50ML LL SCALE MARK (SYRINGE) ×4 IMPLANT
TOWER CARTRIDGE SMART MIX (DISPOSABLE) ×2 IMPLANT
TRAY FOLEY MTR SLVR 16FR STAT (SET/KITS/TRAYS/PACK) ×2 IMPLANT
TUBE KAMVAC SUCTION (TUBING) IMPLANT
TUBE SUCTION HIGH CAP CLEAR NV (SUCTIONS) ×2 IMPLANT
WATER STERILE IRR 1000ML POUR (IV SOLUTION) ×2 IMPLANT
WRAP KNEE MAXI GEL POST OP (GAUZE/BANDAGES/DRESSINGS) ×1 IMPLANT

## 2022-05-17 NOTE — Interval H&P Note (Signed)
History and Physical Interval Note:  05/17/2022 11:48 AM  Barbara Russell  has presented today for surgery, with the diagnosis of unstable right total knee arthroplasty.  The various methods of treatment have been discussed with the patient and family. After consideration of risks, benefits and other options for treatment, the patient has consented to  Procedure(s): Right knee polyethylene versus total knee arthroplasty revision (Right) as a surgical intervention.  The patient's history has been reviewed, patient examined, no change in status, stable for surgery.  I have reviewed the patient's chart and labs.  Questions were answered to the patient's satisfaction.     Pilar Plate Reham Slabaugh

## 2022-05-17 NOTE — Progress Notes (Signed)
Assisted Dr. Valma Cava with right, adductor canal, ultrasound guided block. Side rails up, monitors on throughout procedure. See vital signs in flow sheet. Tolerated Procedure well.

## 2022-05-17 NOTE — Anesthesia Procedure Notes (Signed)
Spinal  Patient location during procedure: OR Start time: 05/17/2022 1:11 PM End time: 05/17/2022 1:16 PM Reason for block: surgical anesthesia Staffing Performed: anesthesiologist  Anesthesiologist: Barnet Glasgow, MD Preanesthetic Checklist Completed: patient identified, IV checked, risks and benefits discussed, surgical consent, monitors and equipment checked, pre-op evaluation and timeout performed Spinal Block Patient position: sitting Prep: DuraPrep and site prepped and draped Patient monitoring: heart rate, cardiac monitor, continuous pulse ox and blood pressure Approach: midline Location: L3-4 Injection technique: single-shot Needle Needle type: Pencan  Needle gauge: 24 G Needle length: 10 cm Needle insertion depth: 6 cm Assessment Sensory level: T4 Events: CSF return Additional Notes  1 Attempt (s). Pt tolerated procedure well.

## 2022-05-17 NOTE — Brief Op Note (Signed)
05/17/2022  2:05 PM  PATIENT:  Barbara Russell  88 y.o. female  PRE-OPERATIVE DIAGNOSIS:  unstable right total knee arthroplasty  POST-OPERATIVE DIAGNOSIS:  unstable right total knee arthroplasty  PROCEDURE:  Procedure(s): Right knee polyethylene revision (Right)  SURGEON:  Surgeon(s) and Role:    Gaynelle Arabian, MD - Primary  PHYSICIAN ASSISTANT:   ASSISTANTS: Jaynie Bream, PA-C   ANESTHESIA:   spinal  EBL:  50 mL   BLOOD ADMINISTERED:none  DRAINS: none   LOCAL MEDICATIONS USED:  OTHER Exparel  COUNTS:  YES  TOURNIQUET:   Total Tourniquet Time Documented: Thigh (Right) - 20 minutes Total: Thigh (Right) - 20 minutes   DICTATION: .Other Dictation: Dictation Number [id number]  PLAN OF CARE: Admit to inpatient   PATIENT DISPOSITION:  PACU - hemodynamically stable.

## 2022-05-17 NOTE — Anesthesia Postprocedure Evaluation (Signed)
Anesthesia Post Note  Patient: Barbara Russell  Procedure(s) Performed: Right knee polyethylene revision (Right: Knee)     Patient location during evaluation: Nursing Unit Anesthesia Type: Regional and Spinal Level of consciousness: oriented and awake and alert Pain management: pain level controlled Vital Signs Assessment: post-procedure vital signs reviewed and stable Respiratory status: spontaneous breathing and respiratory function stable Cardiovascular status: blood pressure returned to baseline and stable Postop Assessment: no headache, no backache, no apparent nausea or vomiting and patient able to bend at knees Anesthetic complications: no   No notable events documented.  Last Vitals:  Vitals:   05/17/22 1600 05/17/22 1615  BP: (!) 138/59 138/62  Pulse: 72 86  Resp: 12 18  Temp:    SpO2: 100% 100%    Last Pain:  Vitals:   05/17/22 1600  TempSrc:   PainSc: 0-No pain                 Barnet Glasgow

## 2022-05-17 NOTE — Anesthesia Procedure Notes (Signed)
Anesthesia Regional Block: Adductor canal block   Pre-Anesthetic Checklist: , timeout performed,  Correct Patient, Correct Site, Correct Laterality,  Correct Procedure, Correct Position, site marked,  Risks and benefits discussed,  Surgical consent,  Pre-op evaluation,  At surgeon's request and post-op pain management  Laterality: Lower and Right  Prep: chloraprep       Needles:  Injection technique: Single-shot  Needle Type: Echogenic Needle     Needle Length: 9cm  Needle Gauge: 22     Additional Needles:   Procedures:,,,, ultrasound used (permanent image in chart),,    Narrative:  Start time: 05/17/2022 12:40 PM End time: 05/17/2022 12:47 PM Injection made incrementally with aspirations every 5 mL.  Performed by: Personally  Anesthesiologist: Barnet Glasgow, MD  Additional Notes: Block assessed prior to surgery. Pt tolerated procedure well.

## 2022-05-17 NOTE — Transfer of Care (Signed)
Immediate Anesthesia Transfer of Care Note  Patient: Barbara Russell  Procedure(s) Performed: Right knee polyethylene revision (Right: Knee)  Patient Location: PACU  Anesthesia Type:Spinal  Level of Consciousness: awake, alert  and oriented  Airway & Oxygen Therapy: Patient Spontanous Breathing  Post-op Assessment: Report given to RN  Post vital signs: stable  Last Vitals:  Vitals Value Taken Time  BP 146/60 05/17/22 1445  Temp    Pulse 70 05/17/22 1446  Resp 14 05/17/22 1446  SpO2 95 % 05/17/22 1446  Vitals shown include unvalidated device data.  Last Pain:  Vitals:   05/17/22 1211  TempSrc:   PainSc: 3          Complications: No notable events documented.

## 2022-05-17 NOTE — Op Note (Signed)
[name], FRIEND MEDICAL RECORD NO: [id number] ACCOUNT NO: [id number] DATE OF BIRTH: 1930-02-11 FACILITY: [facility]ATION: [id number] PHYSICIAN: Dione Plover. Arlette Schaad, MD  Operative Report   DATE OF PROCEDURE: 05/17/2022   PREOPERATIVE DIAGNOSIS:  Unstable right total knee arthroplasty.  POSTOPERATIVE DIAGNOSIS:  Unstable right total knee arthroplasty.  PROCEDURE:  Right knee polyethylene revision.  SURGEON:  Dione Plover. Shanikia Kernodle, MD  ASSISTANT:  Jaynie Bream, PA-C.  ANESTHESIA: Spinal.  ESTIMATED BLOOD LOSS:  10 mL  DRAINS:  None.  TOURNIQUET TIME:  20 minutes at 300 mmHg.  COMPLICATIONS:  None.  CONDITION:  Stable to recovery.  BRIEF CLINICAL NOTE:  The patient is an 88 year old female who has an unstable right total knee arthroplasty.  She has had significant pain and dysfunction with this, the knee has been giving out on her.  Infection workup was negative.  Bone scan was  negative for any loosening.  She presents now for polyethylene versus total knee arthroplasty revision.  DESCRIPTION OF PROCEDURE:  After successful administration of spinal anesthetic and exam under anesthesia was performed and she has gross varus, valgus, anterior, posterior laxity throughout full range of motion. A tourniquet was then placed high on the  right thigh and right lower extremity was prepped and draped in the usual sterile fashion.  Extremity was wrapped in Esmarch and tourniquet inflated to 300 mmHg.  Midline incision was made with a 10 blade through subcutaneous tissue to the level of the  extensor mechanism.  A fresh blade was used to make a medial parapatellar arthrotomy.  There was minimal fluid encountered upon entering the joint.  There was a fair amount of scar tissue under the extensor mechanism and this was removed to recreate both  the medial and lateral gutters and infrapatella region.  Soft tissue over the proximal medial tibia is subperiosteally elevated to the joint line with a  knife and into the semimembranosus bursa with a Cobb elevator.  Soft tissue laterally was elevated  with attention being paid to avoid the patellar tendon on the tibial tubercle.  Patella was everted and knee flexed 90 degrees.  I was able to sublux the tibia forward to the point where I was able to remove the tibial polyethylene.  She had a Sigma  rotating platform knee, the size of the polyethylene was a size 3 and it was 10 mm thick.  It was grossly unstable and insert was basically spinning out and rotating underneath the femur.  The trial was placed, started with the 15.  With the 15 there was  still a little bit of AP laxity and I trialed the 17.5 which also has a small amount of the AP laxity.  We went to a 20, which allowed for full extension with excellent varus, valgus, and anterior, posterior stability throughout full range of motion.  I  then tested the metal components by attempting to see if they move when impacted with a bone tamp also an osteotome.  Both the tibia and femoral components are well positioned and well fixed.  We then opened the permanent 20 mm posterior stabilized  rotating platform insert for the size 3.  It was placed in the tibial tray and the knee was reduced with outstanding stability throughout full range of motion.  Wound was copiously irrigated with saline solution and then 20 mL of Exparel mixed with 60 mL  of saline injected into the extensor mechanism, periosteum of the femur, subcu tissues.  The arthrotomy was then closed with  a running 0 Stratafix suture.  Tourniquet was released, total time of 20 minutes.  Flexion against gravity was 125 degrees with  excellent stability throughout full range of motion.  Subcutaneous was then closed with interrupted 2-0 Vicryl and subcuticular running 4-0 Monocryl.  The incision was cleaned and dried.  Steri-Strips and a sterile dressing applied.  Note that a surgical assistant was of medical necessity for this procedure.  Her  assistance was necessary for retraction of vital ligaments and neurovascular structures and for proper positioning of the limb for safe removal of the old implant and safe  and accurate placement of the new implant.     SUJ D: 05/17/2022 2:10:45 pm T: 05/17/2022 10:05:00 pm  JOB: [id number]/ [id number]

## 2022-05-18 ENCOUNTER — Encounter (HOSPITAL_COMMUNITY): Payer: Self-pay | Admitting: Orthopedic Surgery

## 2022-05-18 LAB — CBC
HCT: 28.9 % — ABNORMAL LOW (ref 36.0–46.0)
Hemoglobin: 9.7 g/dL — ABNORMAL LOW (ref 12.0–15.0)
MCH: 31.7 pg (ref 26.0–34.0)
MCHC: 33.6 g/dL (ref 30.0–36.0)
MCV: 94.4 fL (ref 80.0–100.0)
Platelets: 181 10*3/uL (ref 150–400)
RBC: 3.06 MIL/uL — ABNORMAL LOW (ref 3.87–5.11)
RDW: 13.2 % (ref 11.5–15.5)
WBC: 7 10*3/uL (ref 4.0–10.5)
nRBC: 0 % (ref 0.0–0.2)

## 2022-05-18 LAB — BASIC METABOLIC PANEL
Anion gap: 9 (ref 5–15)
BUN: 21 mg/dL (ref 8–23)
CO2: 25 mmol/L (ref 22–32)
Calcium: 8.5 mg/dL — ABNORMAL LOW (ref 8.9–10.3)
Chloride: 99 mmol/L (ref 98–111)
Creatinine, Ser: 0.83 mg/dL (ref 0.44–1.00)
GFR, Estimated: >60 mL/min (ref >60)
Glucose, Bld: 101 mg/dL — ABNORMAL HIGH (ref 70–99)
Potassium: 3.6 mmol/L (ref 3.5–5.1)
Sodium: 133 mmol/L — ABNORMAL LOW (ref 135–145)

## 2022-05-18 NOTE — Plan of Care (Signed)

## 2022-05-18 NOTE — NC FL2 (Signed)
[place] LEVEL OF CARE SCREENING TOOL     IDENTIFICATION  Patient Name: Barbara Russell Birthdate: Oct 08, 1930 Sex: female Admission Date (Current Location): 05/17/2022  [facility] and [place] Number:  Herbalist and Address:  [facility],  [place], [place]      Provider Number: [id number]  Attending Physician Name and Address:  Gaynelle Arabian, MD  Relative Name and Phone Number:  Lynnzie Blackson (son) Ph: [phone]    Current Level of Care: Hospital Recommended Level of Care: [place] Prior Approval Number:    Date Approved/Denied:   PASRR Number: [id number]  Discharge Plan: SNF    Current Diagnoses: Patient Active Problem List   Diagnosis Date Noted   Failed total knee arthroplasty ([place]) 05/17/2022   Failed total right knee replacement ([place]) 05/17/2022   Pre-operative cardiovascular examination 04/18/2022   Breast cancer ([place]) 02/02/2021   Osteoarthritis 02/02/2021   Osteopenia 02/02/2021   Hyperlipidemia 03/10/2020   Hypertension 02/23/2020   Osteoarthritis of right knee 08/31/2018   Low back pain 03/07/2018   Coronary artery disease due to lipid rich plaque 11/26/2014   Old MI (myocardial infarction) 04/07/2014   Aortic stenosis 04/07/2014   Diabetes ([place]) 04/07/2014   Angina decubitus ([place]) 04/07/2014    Orientation RESPIRATION BLADDER Height & Weight     Self, Time, Situation, Place  Normal Continent, Indwelling catheter Weight: 132 lb 4.4 oz (60 kg) Height:  '5\' 2"'$  (157.5 cm)  BEHAVIORAL SYMPTOMS/MOOD NEUROLOGICAL BOWEL NUTRITION STATUS   (N/A)  (N/A) Continent Diet (Carb modified diet)  AMBULATORY STATUS COMMUNICATION OF NEEDS Skin   Limited Assist Verbally Surgical wounds, Other (Comment) (Ecchymosis: bilateral arms, hands)                       Personal Care Assistance Level of Assistance  Bathing, Feeding, Dressing Bathing Assistance: Limited assistance Feeding assistance:  Independent Dressing Assistance: Limited assistance     Functional Limitations Info  Sight, Hearing, Speech Sight Info: Impaired Hearing Info: Impaired Speech Info: Adequate    SPECIAL CARE FACTORS FREQUENCY  PT (By licensed PT), OT (By licensed OT)     PT Frequency: 5x's/week OT Frequency: 5x's/week            Contractures Contractures Info: Not present    Additional Factors Info  Code Status, Allergies Code Status Info: Full Allergies Info: Amlodipine, Benadryl, Ciprofloxacin, Lactose Intolerance (Gi), Macrobid (Nitrofurantoin Monohyd Macro), Naproxen, Quinine, Sulfate (Quinine), Rosuvastatin, Sulfa Antibiotics, Tramadol, Penicillins, Prednisone           Current Medications (05/18/2022):  This is the current hospital active medication list Current Facility-Administered Medications  Medication Dose Route Frequency Provider Last Rate Last Admin   0.9 %  sodium chloride infusion   Intravenous Continuous Edmisten, Kristie L, PA 75 mL/hr at 05/18/22 0258 Infusion Verify at 05/18/22 0258   acetaminophen (TYLENOL) tablet 325-650 mg  325-650 mg Oral Q6H PRN Jearld Lesch, PA       bisacodyl (DULCOLAX) suppository 10 mg  10 mg Rectal Daily PRN Edmisten, Kristie L, PA       docusate sodium (COLACE) capsule 100 mg  100 mg Oral BID Edmisten, Kristie L, PA   100 mg at 05/18/22 1017   furosemide (LASIX) tablet 10 mg  10 mg Oral Daily Edmisten, Kristie L, PA   10 mg at 05/18/22 1018   gabapentin (NEURONTIN) capsule 600 mg  600 mg Oral QHS Edmisten, Kristie L, PA  hydrALAZINE (APRESOLINE) tablet 25 mg  25 mg Oral Daily PRN Edmisten, Kristie L, PA       hydrochlorothiazide (HYDRODIURIL) tablet 25 mg  25 mg Oral Daily Edmisten, Kristie L, PA   25 mg at 05/18/22 1018   HYDROcodone-acetaminophen (NORCO/VICODIN) 5-325 MG per tablet 1 tablet  1 tablet Oral Q6H PRN Jearld Lesch, PA       HYDROcodone-acetaminophen (NORCO/VICODIN) 5-325 MG per tablet 1-2 tablet  1-2 tablet Oral Q6H  PRN Jearld Lesch, PA   1 tablet at 05/18/22 2641   isosorbide mononitrate (IMDUR) 24 hr tablet 30 mg  30 mg Oral Daily Edmisten, Kristie L, PA   30 mg at 05/18/22 1017   losartan (COZAAR) tablet 50 mg  50 mg Oral BID Edmisten, Kristie L, PA   50 mg at 05/18/22 1017   menthol-cetylpyridinium (CEPACOL) lozenge 3 mg  1 lozenge Oral PRN Edmisten, Kristie L, PA       Or   phenol (CHLORASEPTIC) mouth spray 1 spray  1 spray Mouth/Throat PRN Edmisten, Kristie L, PA       methocarbamol (ROBAXIN) tablet 500 mg  500 mg Oral Q6H PRN Edmisten, Kristie L, PA       Or   methocarbamol (ROBAXIN) 500 mg in dextrose 5 % 50 mL IVPB  500 mg Intravenous Q6H PRN Edmisten, Kristie L, PA   Stopped at 05/17/22 1702   metoCLOPramide (REGLAN) tablet 5-10 mg  5-10 mg Oral Q8H PRN Edmisten, Kristie L, PA       Or   metoCLOPramide (REGLAN) injection 5-10 mg  5-10 mg Intravenous Q8H PRN Edmisten, Kristie L, PA       morphine (PF) 2 MG/ML injection 0.5-1 mg  0.5-1 mg Intravenous Q2H PRN Edmisten, Kristie L, PA   1 mg at 05/17/22 2238   nitroGLYCERIN (NITROSTAT) SL tablet 0.4 mg  0.4 mg Sublingual Q5 min PRN Edmisten, Kristie L, PA       ondansetron (ZOFRAN) tablet 4 mg  4 mg Oral Q6H PRN Edmisten, Kristie L, PA       Or   ondansetron (ZOFRAN) injection 4 mg  4 mg Intravenous Q6H PRN Edmisten, Kristie L, PA   4 mg at 05/18/22 0727   polyethylene glycol (MIRALAX / GLYCOLAX) packet 17 g  17 g Oral Daily PRN Edmisten, Kristie L, PA       rivaroxaban (XARELTO) tablet 10 mg  10 mg Oral Q breakfast Edmisten, Kristie L, PA   10 mg at 05/18/22 0815   sodium phosphate (FLEET) 7-19 GM/118ML enema 1 enema  1 enema Rectal Once PRN Edmisten, Ok Anis, PA         Discharge Medications: Please see discharge summary for a list of discharge medications.  Relevant Imaging Results:  Relevant Lab Results:   Additional Information SSN: [id number]  Sherie Don, LCSW

## 2022-05-18 NOTE — Discharge Instructions (Addendum)
Information on my medicine - XARELTO (Rivaroxaban)  Why was Xarelto prescribed for you? Xarelto was prescribed for you to reduce the risk of blood clots forming after orthopedic surgery. The medical term for these abnormal blood clots is venous thromboembolism (VTE).  What do you need to know about xarelto ? Take your Xarelto ONCE DAILY at the same time every day. You may take it either with or without food.  If you have difficulty swallowing the tablet whole, you may crush it and mix in applesauce just prior to taking your dose.  Take Xarelto exactly as prescribed by your doctor and DO NOT stop taking Xarelto without talking to the doctor who prescribed the medication.  Stopping without other VTE prevention medication to take the place of Xarelto may increase your risk of developing a clot.  After discharge, you should have regular check-up appointments with your healthcare provider that is prescribing your Xarelto.    What do you do if you miss a dose? If you miss a dose, take it as soon as you remember on the same day then continue your regularly scheduled once daily regimen the next day. Do not take two doses of Xarelto on the same day.   Important Safety Information A possible side effect of Xarelto is bleeding. You should call your healthcare provider right away if you experience any of the following: Bleeding from an injury or your nose that does not stop. Unusual colored urine (red or dark brown) or unusual colored stools (red or black). Unusual bruising for unknown reasons. A serious fall or if you hit your head (even if there is no bleeding).  Some medicines may interact with Xarelto and might increase your risk of bleeding while on Xarelto. To help avoid this, consult your healthcare provider or pharmacist prior to using any new prescription or non-prescription medications, including herbals, vitamins, non-steroidal anti-inflammatory drugs (NSAIDs) and  supplements.  This website has more information on Xarelto: [email or web address].[email or web address]     Gaynelle Arabian, MD Total Joint Specialist [facility] [place] [address], [address] [place], [place] [address] [phone]  TOTAL KNEE REPLACEMENT POSTOPERATIVE DIRECTIONS  Knee Rehabilitation, Guidelines Following Surgery  Results after knee surgery are often greatly improved when you follow the exercise, range of motion and muscle strengthening exercises prescribed by your doctor. Safety measures are also important to protect the knee from further injury. If any of these exercises cause you to have increased pain or swelling in your knee joint, decrease the amount until you are comfortable again and slowly increase them. If you have problems or questions, call your caregiver or physical therapist for advice.   HOME CARE INSTRUCTIONS  Remove items at home which could result in a fall. This includes throw rugs or furniture in walking pathways.  ICE to the affected knee as much as tolerated. Icing helps control swelling. If the swelling is well controlled you will be more comfortable and rehab easier. Continue to use ice on the knee for pain and swelling from surgery. You may notice swelling that will progress down to the foot and ankle. This is normal after surgery. Elevate the leg when you are not up walking on it.    Continue to use the breathing machine which will help keep your temperature down. It is common for your temperature to cycle up and down following surgery, especially at night when you are not up moving around and exerting yourself. The breathing machine keeps your lungs expanded and your temperature down. Do  not place pillow under the operative knee, focus on keeping the knee straight while resting  DIET You may resume your previous home diet once you are discharged from the hospital.  DRESSING / WOUND CARE / SHOWERING Keep your bulky bandage on for 2 days. On the third  post-operative day you may remove the Ace bandage and gauze. There is a waterproof adhesive bandage on your skin which will stay in place until your first follow-up appointment. Once you remove this you will not need to place another bandage You may begin showering 3 days following surgery, but do not submerge the incision under water.  ACTIVITY For the first 5 days, the key is rest and control of pain and swelling Do your home exercises twice a day starting on post-operative day 3. On the days you go to physical therapy, just do the home exercises once that day. You should rest, ice and elevate the leg for 50 minutes out of every hour. Get up and walk/stretch for 10 minutes per hour. After 5 days you can increase your activity slowly as tolerated. Walk with your walker as instructed. Use the walker until you are comfortable transitioning to a cane. Walk with the cane in the opposite hand of the operative leg. You may discontinue the cane once you are comfortable and walking steadily. Avoid periods of inactivity such as sitting longer than an hour when not asleep. This helps prevent blood clots.  You may discontinue the knee immobilizer once you are able to perform a straight leg raise while lying down. You may resume a sexual relationship in one month or when given the OK by your doctor.  You may return to work once you are cleared by your doctor.  Do not drive a car for 6 weeks or until released by your surgeon.  Do not drive while taking narcotics.  TED HOSE STOCKINGS Wear the elastic stockings on both legs for three weeks following surgery during the day. You may remove them at night for sleeping.  WEIGHT BEARING Weight bearing as tolerated with assist device (walker, cane, etc) as directed, use it as long as suggested by your surgeon or therapist, typically at least 4-6 weeks.  POSTOPERATIVE CONSTIPATION PROTOCOL Constipation - defined medically as fewer than three stools per week and  severe constipation as less than one stool per week.  One of the most common issues patients have following surgery is constipation.  Even if you have a regular bowel pattern at home, your normal regimen is likely to be disrupted due to multiple reasons following surgery.  Combination of anesthesia, postoperative narcotics, change in appetite and fluid intake all can affect your bowels.  In order to avoid complications following surgery, here are some recommendations in order to help you during your recovery period.  Colace (docusate) - Pick up an over-the-counter form of Colace or another stool softener and take twice a day as long as you are requiring postoperative pain medications.  Take with a full glass of water daily.  If you experience loose stools or diarrhea, hold the colace until you stool forms back up. If your symptoms do not get better within 1 week or if they get worse, check with your doctor. Dulcolax (bisacodyl) - Pick up over-the-counter and take as directed by the product packaging as needed to assist with the movement of your bowels.  Take with a full glass of water.  Use this product as needed if not relieved by Colace only.  MiraLax (polyethylene  glycol) - Pick up over-the-counter to have on hand. MiraLax is a solution that will increase the amount of water in your bowels to assist with bowel movements.  Take as directed and can mix with a glass of water, juice, soda, coffee, or tea. Take if you go more than two days without a movement. Do not use MiraLax more than once per day. Call your doctor if you are still constipated or irregular after using this medication for 7 days in a row.  If you continue to have problems with postoperative constipation, please contact the office for further assistance and recommendations.  If you experience "the worst abdominal pain ever" or develop nausea or vomiting, please contact the office immediatly for further recommendations for  treatment.  ITCHING If you experience itching with your medications, try taking only a single pain pill, or even half a pain pill at a time.  You can also use Benadryl over the counter for itching or also to help with sleep.   MEDICATIONS See your medication summary on the "After Visit Summary" that the nursing staff will review with you prior to discharge.  You may have some home medications which will be placed on hold until you complete the course of blood thinner medication.  It is important for you to complete the blood thinner medication as prescribed by your surgeon.  Continue your approved medications as instructed at time of discharge.  PRECAUTIONS If you experience chest pain or shortness of breath - call 911 immediately for transfer to the hospital emergency department.  If you develop a fever greater that 101 F, purulent drainage from wound, increased redness or drainage from wound, foul odor from the wound/dressing, or calf pain - CONTACT YOUR SURGEON.                                                   FOLLOW-UP APPOINTMENTS Make sure you keep all of your appointments after your operation with your surgeon and caregivers. You should call the office at the above phone number and make an appointment for approximately two weeks after the date of your surgery or on the date instructed by your surgeon outlined in the "After Visit Summary".  RANGE OF MOTION AND STRENGTHENING EXERCISES  Rehabilitation of the knee is important following a knee injury or an operation. After just a few days of immobilization, the muscles of the thigh which control the knee become weakened and shrink (atrophy). Knee exercises are designed to build up the tone and strength of the thigh muscles and to improve knee motion. Often times heat used for twenty to thirty minutes before working out will loosen up your tissues and help with improving the range of motion but do not use heat for the first two weeks following  surgery. These exercises can be done on a training (exercise) mat, on the floor, on a table or on a bed. Use what ever works the best and is most comfortable for you Knee exercises include:  Leg Lifts - While your knee is still immobilized in a splint or cast, you can do straight leg raises. Lift the leg to 60 degrees, hold for 3 sec, and slowly lower the leg. Repeat 10-20 times 2-3 times daily. Perform this exercise against resistance later as your knee gets better.  Quad and Hamstring Sets - Tighten up  the muscle on the front of the thigh (Quad) and hold for 5-10 sec. Repeat this 10-20 times hourly. Hamstring sets are done by pushing the foot backward against an object and holding for 5-10 sec. Repeat as with quad sets.  Leg Slides: Lying on your back, slowly slide your foot toward your buttocks, bending your knee up off the floor (only go as far as is comfortable). Then slowly slide your foot back down until your leg is flat on the floor again. Angel Wings: Lying on your back spread your legs to the side as far apart as you can without causing discomfort.  A rehabilitation program following serious knee injuries can speed recovery and prevent re-injury in the future due to weakened muscles. Contact your doctor or a physical therapist for more information on knee rehabilitation.   POST-OPERATIVE OPIOID TAPER INSTRUCTIONS: It is important to wean off of your opioid medication as soon as possible. If you do not need pain medication after your surgery it is ok to stop day one. Opioids include: Codeine, Hydrocodone(Norco, Vicodin), Oxycodone(Percocet, oxycontin) and hydromorphone amongst others.  Long term and even short term use of opiods can cause: Increased pain response Dependence Constipation Depression Respiratory depression And more.  Withdrawal symptoms can include Flu like symptoms Nausea, vomiting And more Techniques to manage these symptoms Hydrate well Eat regular healthy  meals Stay active Use relaxation techniques(deep breathing, meditating, yoga) Do Not substitute Alcohol to help with tapering If you have been on opioids for less than two weeks and do not have pain than it is ok to stop all together.  Plan to wean off of opioids This plan should start within one week post op of your joint replacement. Maintain the same interval or time between taking each dose and first decrease the dose.  Cut the total daily intake of opioids by one tablet each day Next start to increase the time between doses. The last dose that should be eliminated is the evening dose.   IF YOU ARE TRANSFERRED TO A SKILLED REHAB FACILITY If the patient is transferred to a skilled rehab facility following release from the hospital, a list of the current medications will be sent to the facility for the patient to continue.  When discharged from the skilled rehab facility, please have the facility set up the patient's [place] prior to being released. Also, the skilled facility will be responsible for providing the patient with their medications at time of release from the facility to include their pain medication, the muscle relaxants, and their blood thinner medication. If the patient is still at the rehab facility at time of the two week follow up appointment, the skilled rehab facility will also need to assist the patient in arranging follow up appointment in our office and any transportation needs.  MAKE SURE YOU:  Understand these instructions.  Get help right away if you are not doing well or get worse.   DENTAL ANTIBIOTICS:  In most cases prophylactic antibiotics for Dental procdeures after total joint surgery are not necessary.  Exceptions are as follows:  1. History of prior total joint infection  2. Severely immunocompromised (Organ Transplant, cancer chemotherapy, Rheumatoid biologic meds such as [place])  3. Poorly controlled diabetes (A1C &gt; 8.0,  blood glucose over 200)  If you have one of these conditions, contact your surgeon for an antibiotic prescription, prior to your dental procedure.    Pick up stool softner and laxative for home use following surgery while  on pain medications. Do not submerge incision under water. Please use good hand washing techniques while changing dressing each day. May shower starting three days after surgery. Please use a clean towel to pat the incision dry following showers. Continue to use ice for pain and swelling after surgery. Do not use any lotions or creams on the incision until instructed by your surgeon.

## 2022-05-18 NOTE — Evaluation (Signed)
Physical Therapy Evaluation Patient Details Name: Barbara Russell MRN: [id number] DOB: 04-19-1930 Today's Date: 05/18/2022  History of Present Illness  Pt s/p L TKR revision and with hx of  aortic stenosis, MI, CKD, DM, and R TKR (2010).  Clinical Impression  Pt s/p L TKR revision and presents with functional mobility limitations 2* decreased R LE strength/ROM, post op pain, balance deficits, decreased endurance and premorbid deconditioning.  This date pt assisted up from bed to [facility] for toileting and then ambulated limited distance in room - pt is currently requiring increased time and significant assist for performance of all basic mobility tasks and would benefit from follow up rehab at SNF level to maximize IND and safety.     Recommendations for follow up therapy are one component of a multi-disciplinary discharge planning process, led by the attending physician.  Recommendations may be updated based on patient status, additional functional criteria and insurance authorization.  Follow Up Recommendations Skilled nursing-short term rehab (<3 hours/day)    Assistance Recommended at Discharge Frequent or constant Supervision/Assistance  Patient can return home with the following  A lot of help with walking and/or transfers;A lot of help with bathing/dressing/bathroom;Assistance with cooking/housework;Assist for transportation;Help with stairs or ramp for entrance    Equipment Recommendations    Recommendations for Other Services       Functional Status Assessment Patient has had a recent decline in their functional status and demonstrates the ability to make significant improvements in function in a reasonable and predictable amount of time.     Precautions / Restrictions Precautions Precautions: Fall Required Braces or Orthoses: Knee Immobilizer - Right Knee Immobilizer - Right: Discontinue once straight leg raise with < 10 degree lag Restrictions Weight Bearing Restrictions:  No Other Position/Activity Restrictions: WBAT      Mobility  Bed Mobility Overal bed mobility: Needs Assistance Bed Mobility: Supine to Sit     Supine to sit: Mod assist     General bed mobility comments: Increased time with assist to manage R LE and to control trunk.  Use of bed pad to complete rotation to EOB sitting    Transfers Overall transfer level: Needs assistance Equipment used: Rolling walker (2 wheels) Transfers: Sit to/from Stand, Bed to chair/wheelchair/BSC Sit to Stand: Mod assist, From elevated surface   Step pivot transfers: Mod assist       General transfer comment: cues for LE management and use of UEs to self assist.  Physical assist to bring wt up and fwd and to balance in standing.  Step pvt bed to [facility] with RW    Ambulation/Gait Ambulation/Gait assistance: Min assist, Mod assist Gait Distance (Feet): 7 Feet Assistive device: Rolling walker (2 wheels) Gait Pattern/deviations: Step-to pattern, Decreased step length - right, Decreased step length - left, Shuffle, Trunk flexed, Antalgic Gait velocity: decr     General Gait Details: Increased time with cues for sequence, posture and position from [employer]            Wheelchair Mobility    Modified Rankin (Stroke Patients Only)       Balance Overall balance assessment: Needs assistance Sitting-balance support: No upper extremity supported, Feet supported Sitting balance-Leahy Scale: Fair     Standing balance support: Bilateral upper extremity supported Standing balance-Leahy Scale: Poor                               Pertinent Vitals/Pain Pain Assessment Pain Assessment:  0-10 Pain Score: 7  Pain Location: R knee and at times extending up to hip Pain Descriptors / Indicators: Aching, Grimacing, Guarding, Sore Pain Intervention(s): Limited activity within patient's tolerance, Monitored during session, Premedicated before session    Home Living Family/patient expects  to be discharged to:: Skilled nursing facility                        Prior Function Prior Level of Function : Independent/Modified Independent             Mobility Comments: using rollator at all times       Hand Dominance   Dominant Hand: Right    Extremity/Trunk Assessment   Upper Extremity Assessment Upper Extremity Assessment: Overall WFL for tasks assessed    Lower Extremity Assessment Lower Extremity Assessment: RLE deficits/detail RLE: Unable to fully assess due to pain (Pt stating told not to bend knee so will clarify)    Cervical / Trunk Assessment Cervical / Trunk Assessment: Normal  Communication   Communication: HOH  Cognition Arousal/Alertness: Awake/alert Behavior During Therapy: WFL for tasks assessed/performed Overall Cognitive Status: Within Functional Limits for tasks assessed                                          General Comments      Exercises Total Joint Exercises Ankle Circles/Pumps: AROM, Both, 15 reps, Supine   Assessment/Plan    PT Assessment Patient needs continued PT services  PT Problem List Decreased strength;Decreased range of motion;Decreased balance;Decreased mobility;Decreased activity tolerance;Decreased knowledge of use of DME;Pain       PT Treatment Interventions DME instruction;Gait training;Stair training;Functional mobility training;Therapeutic activities;Therapeutic exercise;Balance training;Patient/family education    PT Goals (Current goals can be found in the Care Plan section)  Acute Rehab PT Goals Patient Stated Goal: Regain IND PT Goal Formulation: With patient Time For Goal Achievement: 06/01/22 Potential to Achieve Goals: Fair    Frequency 7X/week     Co-evaluation               AM-PAC PT "6 Clicks" Mobility  Outcome Measure Help needed turning from your back to your side while in a flat bed without using bedrails?: A Lot Help needed moving from lying on your back  to sitting on the side of a flat bed without using bedrails?: A Lot Help needed moving to and from a bed to a chair (including a wheelchair)?: A Lot Help needed standing up from a chair using your arms (e.g., wheelchair or bedside chair)?: A Lot Help needed to walk in hospital room?: Total Help needed climbing 3-5 steps with a railing? : Total 6 Click Score: 10    End of Session Equipment Utilized During Treatment: Gait belt;Right knee immobilizer Activity Tolerance: Patient limited by fatigue;Patient limited by pain Patient left: in chair;with call bell/phone within reach;with chair alarm set;with family/visitor present Nurse Communication: Mobility status PT Visit Diagnosis: Difficulty in walking, not elsewhere classified (R26.2);Pain Pain - Right/Left: Right Pain - part of body: Knee    Time: 0931-1010 PT Time Calculation (min) (ACUTE ONLY): 39 min   Charges:   PT Evaluation $PT Eval Low Complexity: 1 Low PT Treatments $Gait Training: 8-22 mins $Therapeutic Activity: 8-22 mins        Debe Coder PT Acute Rehabilitation Services Pager [phone] Office [phone]   Charnelle Bergeman 05/18/2022, 12:32 PM

## 2022-05-18 NOTE — Progress Notes (Signed)
Physical Therapy Treatment Patient Details Name: Barbara Russell MRN: [id number] DOB: October 23, 1930 Today's Date: 05/18/2022   History of Present Illness Pt s/p L TKR revision and with hx of  aortic stenosis, MI, CKD, DM, and R TKR (2010).    PT Comments    Pt continues cooperative but limited by pain and fatigues easily.  Pt assisted up from recliner to ambulate limited distance to return to bed.  Pt continues to require significant assist for performance of all basic mobility tasks.  Recommendations for follow up therapy are one component of a multi-disciplinary discharge planning process, led by the attending physician.  Recommendations may be updated based on patient status, additional functional criteria and insurance authorization.  Follow Up Recommendations  Skilled nursing-short term rehab (<3 hours/day)     Assistance Recommended at Discharge Frequent or constant Supervision/Assistance  Patient can return home with the following A lot of help with walking and/or transfers;A lot of help with bathing/dressing/bathroom;Assistance with cooking/housework;Assist for transportation;Help with stairs or ramp for entrance   Equipment Recommendations       Recommendations for Other Services       Precautions / Restrictions Precautions Precautions: Fall Required Braces or Orthoses: Knee Immobilizer - Right Knee Immobilizer - Right: Discontinue once straight leg raise with < 10 degree lag Restrictions Weight Bearing Restrictions: No Other Position/Activity Restrictions: WBAT     Mobility  Bed Mobility Overal bed mobility: Needs Assistance Bed Mobility: Sit to Supine     Supine to sit: Mod assist Sit to supine: Mod assist   General bed mobility comments: Increased time with assist to manage R LE and to control trunk.  Use of bed pad to complete rotation into bed    Transfers Overall transfer level: Needs assistance Equipment used: Rolling walker (2 wheels) Transfers: Sit  to/from Stand, Bed to chair/wheelchair/BSC Sit to Stand: Mod assist, +2 physical assistance, +2 safety/equipment   Step pivot transfers: Mod assist       General transfer comment: cues for LE management and use of UEs to self assist.  Physical assist to bring wt up and fwd and to balance in standing.  Step pvt bed to [facility] with RW    Ambulation/Gait Ambulation/Gait assistance: Min assist, Mod assist Gait Distance (Feet): 3 Feet Assistive device: Rolling walker (2 wheels) Gait Pattern/deviations: Step-to pattern, Decreased step length - right, Decreased step length - left, Shuffle, Trunk flexed, Antalgic Gait velocity: decr     General Gait Details: Increased time with cues for sequence, posture and position from [employer]             Wheelchair Mobility    Modified Rankin (Stroke Patients Only)       Balance Overall balance assessment: Needs assistance Sitting-balance support: No upper extremity supported, Feet supported Sitting balance-Leahy Scale: Fair     Standing balance support: Bilateral upper extremity supported Standing balance-Leahy Scale: Poor                              Cognition Arousal/Alertness: Awake/alert Behavior During Therapy: WFL for tasks assessed/performed Overall Cognitive Status: Within Functional Limits for tasks assessed                                          Exercises Total Joint Exercises Ankle Circles/Pumps: AROM, Both, 15 reps, Supine  General Comments        Pertinent Vitals/Pain Pain Assessment Pain Assessment: 0-10 Pain Score: 6  Pain Location: R knee and at times extending up to hip Pain Descriptors / Indicators: Aching, Grimacing, Guarding, Sore Pain Intervention(s): Limited activity within patient's tolerance, Monitored during session, Premedicated before session, Ice applied    Home Living Family/patient expects to be discharged to:: Skilled nursing facility                         Prior Function            PT Goals (current goals can now be found in the care plan section) Acute Rehab PT Goals Patient Stated Goal: Regain IND PT Goal Formulation: With patient Time For Goal Achievement: 06/01/22 Potential to Achieve Goals: Fair Progress towards PT goals: Progressing toward goals    Frequency    7X/week      PT Plan Current plan remains appropriate    Co-evaluation              AM-PAC PT "6 Clicks" Mobility   Outcome Measure  Help needed turning from your back to your side while in a flat bed without using bedrails?: A Lot Help needed moving from lying on your back to sitting on the side of a flat bed without using bedrails?: A Lot Help needed moving to and from a bed to a chair (including a wheelchair)?: A Lot Help needed standing up from a chair using your arms (e.g., wheelchair or bedside chair)?: A Lot Help needed to walk in hospital room?: Total Help needed climbing 3-5 steps with a railing? : Total 6 Click Score: 10    End of Session Equipment Utilized During Treatment: Gait belt;Right knee immobilizer Activity Tolerance: Patient limited by fatigue;Patient limited by pain Patient left: with call bell/phone within reach;with family/visitor present;in bed;with bed alarm set Nurse Communication: Mobility status PT Visit Diagnosis: Difficulty in walking, not elsewhere classified (R26.2);Pain Pain - Right/Left: Right Pain - part of body: Knee     Time: 1110-1137 PT Time Calculation (min) (ACUTE ONLY): 27 min  Charges:  $Gait Training: 8-22 mins $Therapeutic Activity: 8-22 mins                     Debe Coder PT Acute Rehabilitation Services Pager [phone] Office [phone]    Hilery Wintle 05/18/2022, 12:47 PM

## 2022-05-18 NOTE — Progress Notes (Signed)
Subjective: 1 Day Post-Op Procedure(s) (LRB): Right knee polyethylene revision (Right) Patient seen in rounds by Dr. Wynelle Link. Patient reports pain as mild.   Patient is well, and has had no acute complaints or problems. Denies SOB or chest pain. Foley cath removed this AM. We will start physical therapy today.  Objective: Vital signs in last 24 hours: Temp:  [97.7 F (36.5 C)-99.5 F (37.5 C)] 99.5 F (37.5 C) (06/08 0603) Pulse Rate:  [59-86] 83 (06/08 0603) Resp:  [11-18] 17 (06/08 0603) BP: (92-145)/(44-87) 128/55 (06/08 0603) SpO2:  [91 %-100 %] 91 % (06/08 0603) Weight:  [60 kg] 60 kg (06/07 1729)  Intake/Output from previous day:  Intake/Output Summary (Last 24 hours) at 05/18/2022 0854 Last data filed at 05/18/2022 0643 Gross per 24 hour  Intake 2286.49 ml  Output 2150 ml  Net 136.49 ml     Intake/Output this shift: No intake/output data recorded.  Labs: Recent Labs    05/18/22 0348  HGB 9.7*   Recent Labs    05/18/22 0348  WBC 7.0  RBC 3.06*  HCT 28.9*  PLT 181   Recent Labs    05/18/22 0348  NA 133*  K 3.6  CL 99  CO2 25  BUN 21  CREATININE 0.83  GLUCOSE 101*  CALCIUM 8.5*   No results for input(s): "LABPT", "INR" in the last 72 hours.  Exam: General - Patient is Alert and Oriented Extremity - Neurologically intact Neurovascular intact Sensation intact distally Dorsiflexion/Plantar flexion intact Dressing - dressing C/D/I Motor Function - intact, moving foot and toes well on exam.  Past Medical History:  Diagnosis Date   Anxiety    Aortic stenosis    moderate by 08/25/21 echo   Breast cancer ([place])    Hx of BC, s/p mastectomy on right 1999, s/p tamoxifen X1 year   Chest pain    EC Stress test 12/21/11 Low risk, no ischemia, normal EF   Chronic kidney disease    Coronary artery disease    Remote Hx of CAD, normal stress test 2009   Diabetes mellitus without complication (HCC)    Diverticulitis    Dyspnea    GERD  (gastroesophageal reflux disease)    No longer a problem   Hypercholesteremia    Hypertension    Insomnia    Lichen sclerosus    Low back pain    Myocardial infarction ([place]) 12/11/1998   Osteoarthrosis, unspecified whether generalized or localized, unspecified site    Pain    Several year Hx of pain/ SOB radiating to her neck   SOB (shortness of breath)    UTI (urinary tract infection)     Assessment/Plan: 1 Day Post-Op Procedure(s) (LRB): Right knee polyethylene revision (Right) Principal Problem:   Failed total knee arthroplasty ([place]) Active Problems:   Failed total right knee replacement (HCC)  Estimated body mass index is 24.19 kg/m as calculated from the following:   Height as of this encounter: '5\' 2"'$  (1.575 m).   Weight as of this encounter: 60 kg. Advance diet Up with therapy D/C IV fluids  Patient's anticipated LOS is less than 2 midnights, meeting these requirements: - Lives within 1 hour of care - Has a competent adult at home to recover with post-op - NO history of  - Chronic pain requiring opiods  - Diabetes  - Coronary Artery Disease  - Heart failure  - Heart attack  - Stroke  - DVT/VTE  - Cardiac arrhythmia  - Respiratory Failure/COPD  -  Renal failure  - Anemia  - Advanced Liver disease  DVT Prophylaxis - Xarelto Weight bearing as tolerated.  Plan is to go to SNF unit at current residential facility after hospital stay. Consult placed to social work for assistance.  Start with physical therapy today.  R. Jaynie Bream, PA-C Orthopedic Surgery [phone] 05/18/2022, 8:54 AM

## 2022-05-18 NOTE — TOC Initial Note (Addendum)
Transition of Care [facility]) - Initial/Assessment Note   Patient Details  Name: Barbara Russell MRN: [id number] Date of Birth: May 08, 1930  Transition of [facility]) CM/SW Contact:    Sherie Don, LCSW Phone Number: 05/18/2022, 12:46 PM  Clinical Narrative: CSW met with patient and her son to discuss discharge plan as patient is now expected to discharge to SNF rather than go back to her ILF with HHPT through [place]. Patient and son confirmed they want [facility] as she has been there before and likes the PT program. CSW confirmed with ortho PA that SNF is the discharge plan. Patient will require 3 midnights as an inpatient prior to discharge.  FL2 done; PASRR verified. Initial referral faxed to [facility] for review.  Addendum: CSW confirmed bed acceptance with Juliann Pulse in admissions at [facility].  Expected Discharge Plan: [place] Barriers to Discharge: Continued Medical Work up, SNF Pending bed offer  Patient Goals and CMS Choice Patient states their goals for this hospitalization and ongoing recovery are:: Go to [facility] for rehab CMS Medicare.gov Compare Post Acute Care list provided to:: Patient Choice offered to / list presented to : Patient, Adult Children  Expected Discharge Plan and Services Expected Discharge Plan: [place] In-house Referral: Clinical Social Work Post Acute Care Choice: [place] Living arrangements for the past 2 months: [place]            DME Arranged: N/A DME Agency: NA  Prior Living Arrangements/Services Living arrangements for the past 2 months: [place] Lives with:: Self Patient language and need for interpreter reviewed:: Yes Do you feel safe going back to the place where you live?: Yes      Need for Family Participation in Patient Care: No (Comment) Care giver support system in place?: Yes (comment) Criminal Activity/Legal  Involvement Pertinent to Current Situation/Hospitalization: No - Comment as needed  Activities of Daily Living Home Assistive Devices/Equipment: Environmental consultant (specify type), Eyeglasses ADL Screening (condition at time of admission) Patient's cognitive ability adequate to safely complete daily activities?: Yes Is the patient deaf or have difficulty hearing?: Yes Does the patient have difficulty seeing, even when wearing glasses/contacts?: No Does the patient have difficulty concentrating, remembering, or making decisions?: No Patient able to express need for assistance with ADLs?: Yes Does the patient have difficulty dressing or bathing?: No Independently performs ADLs?: Yes (appropriate for developmental age) Does the patient have difficulty walking or climbing stairs?: Yes Weakness of Legs: Right Weakness of Arms/Hands: None  Permission Sought/Granted Permission sought to share information with : Facility Art therapist granted to share information with : Yes, Verbal Permission Granted Permission granted to share info w AGENCY: [place]  Emotional Assessment Appearance:: Appears stated age Attitude/Demeanor/Rapport: Engaged Affect (typically observed): Accepting Orientation: : Oriented to Self, Oriented to Place, Oriented to  Time, Oriented to Situation Alcohol / Substance Use: Not Applicable Psych Involvement: No (comment)  Admission diagnosis:  Failed total right knee replacement ([place]) [T84.012A] Patient Active Problem List   Diagnosis Date Noted   Failed total knee arthroplasty ([place]) 05/17/2022   Failed total right knee replacement ([place]) 05/17/2022   Pre-operative cardiovascular examination 04/18/2022   Breast cancer ([place]) 02/02/2021   Osteoarthritis 02/02/2021   Osteopenia 02/02/2021   Hyperlipidemia 03/10/2020   Hypertension 02/23/2020   Osteoarthritis of right knee 08/31/2018   Low back pain 03/07/2018   Coronary artery disease due to lipid  rich plaque 11/26/2014  Old MI (myocardial infarction) 04/07/2014   Aortic stenosis 04/07/2014   Diabetes ([place]) 04/07/2014   Angina decubitus ([place]) 04/07/2014   PCP:  Seward Carol, MD Pharmacy:   [facility] [place], [place] - [address] [place] [address] Phone: [phone] Fax: [phone]  [id number] [place] [id number] [id number]: [phone]: [phone] Readmission Risk Interventions     No data to display

## 2022-05-19 ENCOUNTER — Other Ambulatory Visit (HOSPITAL_COMMUNITY): Payer: Medicare Other

## 2022-05-19 ENCOUNTER — Other Ambulatory Visit (HOSPITAL_COMMUNITY): Payer: Self-pay

## 2022-05-19 LAB — CBC
HCT: 30.9 % — ABNORMAL LOW (ref 36.0–46.0)
Hemoglobin: 10.5 g/dL — ABNORMAL LOW (ref 12.0–15.0)
MCH: 31.6 pg (ref 26.0–34.0)
MCHC: 34 g/dL (ref 30.0–36.0)
MCV: 93.1 fL (ref 80.0–100.0)
Platelets: 220 10*3/uL (ref 150–400)
RBC: 3.32 MIL/uL — ABNORMAL LOW (ref 3.87–5.11)
RDW: 13.2 % (ref 11.5–15.5)
WBC: 8.3 10*3/uL (ref 4.0–10.5)
nRBC: 0 % (ref 0.0–0.2)

## 2022-05-19 MED ORDER — RIVAROXABAN 10 MG PO TABS: 10.0000 mg | ORAL_TABLET | Freq: Every day | ORAL | 0 refills | Status: DC

## 2022-05-19 MED ORDER — HYDROCODONE-ACETAMINOPHEN 5-325 MG PO TABS: 1.0000 | ORAL_TABLET | Freq: Four times a day (QID) | ORAL | 0 refills | Status: DC | PRN

## 2022-05-19 MED ORDER — METHOCARBAMOL 500 MG PO TABS: 500.0000 mg | ORAL_TABLET | Freq: Four times a day (QID) | ORAL | 0 refills | Status: DC | PRN

## 2022-05-19 MED ORDER — ASPIRIN 81 MG PO TABS
81.0000 mg | ORAL_TABLET | Freq: Every day | ORAL | 0 refills | Status: DC
  Filled 2022-05-19: qty 30, 30d supply, fill #0

## 2022-05-19 NOTE — Plan of Care (Signed)
Problem: Education: Goal: Knowledge of the prescribed therapeutic regimen will improve Outcome: Progressing   Problem: Activity: Goal: Ability to avoid complications of mobility impairment will improve Outcome: Progressing   Problem: Pain Management: Goal: Pain level will decrease with appropriate interventions Outcome: Progressing  Ivan Anchors, RN 05/19/22 8:44 AM

## 2022-05-19 NOTE — Care Management Important Message (Signed)
Important Message  Patient Details IM Letter given to the Patient Name: Barbara Russell MRN: [id number] Date of Birth: 1930-08-07   Medicare Important Message Given:  Yes     Kerin Salen 05/19/2022, 1:34 PM

## 2022-05-19 NOTE — Progress Notes (Addendum)
   Subjective: 2 Days Post-Op Procedure(s) (LRB): Right knee polyethylene revision (Right) Patient reports pain as mild.   Patient seen in rounds by Dr. Wynelle Link. Patient is well, and has had no acute complaints or problems. No issues overnight. Denies chest pain or SOB. Plan is to go to [facility] after stay.  Objective: Vital signs in last 24 hours: Temp:  [97.7 F (36.5 C)-98.6 F (37 C)] 98 F (36.7 C) (06/09 0548) Pulse Rate:  [66-106] 66 (06/09 0548) Resp:  [17-18] 17 (06/09 0548) BP: (137-174)/(56-86) 137/56 (06/09 0548) SpO2:  [93 %-97 %] 96 % (06/09 0548)  Intake/Output from previous day:  Intake/Output Summary (Last 24 hours) at 05/19/2022 0722 Last data filed at 05/19/2022 0600 Gross per 24 hour  Intake 800 ml  Output 200 ml  Net 600 ml    Intake/Output this shift: No intake/output data recorded.  Labs: Recent Labs    05/18/22 0348  HGB 9.7*   Recent Labs    05/18/22 0348  WBC 7.0  RBC 3.06*  HCT 28.9*  PLT 181   Recent Labs    05/18/22 0348  NA 133*  K 3.6  CL 99  CO2 25  BUN 21  CREATININE 0.83  GLUCOSE 101*  CALCIUM 8.5*   No results for input(s): "LABPT", "INR" in the last 72 hours.  Exam: General - Patient is Alert and Oriented Extremity - Neurologically intact Neurovascular intact Sensation intact distally Dorsiflexion/Plantar flexion intact Dressing/Incision - clean, dry, no drainage Motor Function - intact, moving foot and toes well on exam.   Past Medical History:  Diagnosis Date   Anxiety    Aortic stenosis    moderate by 08/25/21 echo   Breast cancer ([place])    Hx of BC, s/p mastectomy on right 1999, s/p tamoxifen X1 year   Chest pain    EC Stress test 12/21/11 Low risk, no ischemia, normal EF   Chronic kidney disease    Coronary artery disease    Remote Hx of CAD, normal stress test 2009   Diabetes mellitus without complication (HCC)    Diverticulitis    Dyspnea    GERD (gastroesophageal reflux disease)     No longer a problem   Hypercholesteremia    Hypertension    Insomnia    Lichen sclerosus    Low back pain    Myocardial infarction ([place]) 12/11/1998   Osteoarthrosis, unspecified whether generalized or localized, unspecified site    Pain    Several year Hx of pain/ SOB radiating to her neck   SOB (shortness of breath)    UTI (urinary tract infection)     Assessment/Plan: 2 Days Post-Op Procedure(s) (LRB): Right knee polyethylene revision (Right) Principal Problem:   Failed total knee arthroplasty ([place]) Active Problems:   Failed total right knee replacement (HCC)  Estimated body mass index is 24.19 kg/m as calculated from the following:   Height as of this encounter: '5\' 2"'$  (1.575 m).   Weight as of this encounter: 60 kg. Up with therapy  DVT Prophylaxis - Xarelto Weight-bearing as tolerated  Bed arranged at SNF, appreciative of social work's assistance. Will discharge tomorrow due to 3-midnight rule with medicare. Follow-up in the office in 2 weeks.  The PDMP database was reviewed today prior to any opioid medications being prescribed to this patient.  Theresa Duty, PA-C Orthopedic Surgery [phone] 05/19/2022, 7:22 AM

## 2022-05-19 NOTE — Progress Notes (Signed)
Physical Therapy Treatment Patient Details Name: Barbara Russell MRN: [id number] DOB: 26-Dec-1929 Today's Date: 05/19/2022   History of Present Illness Pt s/p L TKR revision and with hx of  aortic stenosis, MI, CKD, DM, and R TKR (2010).    PT Comments    Pt in good spirits and continuing to progress well with noted improvement in stability, activity tolerance and level of assist needed.   Recommendations for follow up therapy are one component of a multi-disciplinary discharge planning process, led by the attending physician.  Recommendations may be updated based on patient status, additional functional criteria and insurance authorization.  Follow Up Recommendations  Skilled nursing-short term rehab (<3 hours/day)     Assistance Recommended at Discharge Frequent or constant Supervision/Assistance  Patient can return home with the following Assistance with cooking/housework;Assist for transportation;Help with stairs or ramp for entrance;A little help with walking and/or transfers;A little help with bathing/dressing/bathroom   Equipment Recommendations       Recommendations for Other Services       Precautions / Restrictions Precautions Precautions: Fall Required Braces or Orthoses: Knee Immobilizer - Right Knee Immobilizer - Right: Discontinue once straight leg raise with < 10 degree lag Restrictions Weight Bearing Restrictions: No RLE Weight Bearing: Weight bearing as tolerated Other Position/Activity Restrictions: WBAT     Mobility  Bed Mobility Overal bed mobility: Needs Assistance Bed Mobility: Supine to Sit     Supine to sit: Min assist     General bed mobility comments: Pt up in chair and requests back to same    Transfers Overall transfer level: Needs assistance Equipment used: Rolling walker (2 wheels) Transfers: Sit to/from Stand Sit to Stand: Min assist           General transfer comment: cues for LE management and use of UEs to self assist.   Physical assist to bring wt up and fwd and to balance in standing.    Ambulation/Gait Ambulation/Gait assistance: Min assist Gait Distance (Feet): 100 Feet (twice) Assistive device: Rolling walker (2 wheels) Gait Pattern/deviations: Decreased step length - right, Decreased step length - left, Shuffle, Trunk flexed, Antalgic, Step-to pattern, Step-through pattern Gait velocity: decr     General Gait Details: cues for sequence, posture and position from [employer]             Wheelchair Mobility    Modified Rankin (Stroke Patients Only)       Balance Overall balance assessment: Needs assistance Sitting-balance support: No upper extremity supported, Feet supported Sitting balance-Leahy Scale: Good     Standing balance support: Single extremity supported Standing balance-Leahy Scale: Poor                              Cognition Arousal/Alertness: Awake/alert Behavior During Therapy: WFL for tasks assessed/performed Overall Cognitive Status: Within Functional Limits for tasks assessed                                          Exercises Total Joint Exercises Ankle Circles/Pumps: AROM, Both, 15 reps, Supine Quad Sets: AROM, Both, 10 reps, Supine Heel Slides: AAROM, Right, 15 reps, Supine Straight Leg Raises: AAROM, Right, 15 reps, Supine Goniometric ROM: AAROM R knee    General Comments        Pertinent Vitals/Pain Pain Assessment Pain Assessment: 0-10 Pain Score: 3  Pain Location: R knee Pain Descriptors / Indicators: Aching, Sore Pain Intervention(s): Limited activity within patient's tolerance, Monitored during session, Premedicated before session, Ice applied    Home Living                          Prior Function            PT Goals (current goals can now be found in the care plan section) Acute Rehab PT Goals Patient Stated Goal: Regain IND PT Goal Formulation: With patient Time For Goal Achievement:  06/01/22 Potential to Achieve Goals: Good Progress towards PT goals: Progressing toward goals    Frequency    7X/week      PT Plan Current plan remains appropriate    Co-evaluation              AM-PAC PT "6 Clicks" Mobility   Outcome Measure  Help needed turning from your back to your side while in a flat bed without using bedrails?: A Little Help needed moving from lying on your back to sitting on the side of a flat bed without using bedrails?: A Little Help needed moving to and from a bed to a chair (including a wheelchair)?: A Little Help needed standing up from a chair using your arms (e.g., wheelchair or bedside chair)?: A Little Help needed to walk in hospital room?: A Little Help needed climbing 3-5 steps with a railing? : A Lot 6 Click Score: 17    End of Session Equipment Utilized During Treatment: Gait belt;Right knee immobilizer Activity Tolerance: Patient tolerated treatment well Patient left: in chair;with call bell/phone within reach;with chair alarm set;with family/visitor present Nurse Communication: Mobility status PT Visit Diagnosis: Difficulty in walking, not elsewhere classified (R26.2);Pain Pain - Right/Left: Right Pain - part of body: Knee     Time: 3716-9678 PT Time Calculation (min) (ACUTE ONLY): 19 min  Charges:  $Gait Training: 8-22 mins $Therapeutic Exercise: 8-22 mins                     [place] Pager [phone] Office [phone]    Claxton Levitz 05/19/2022, 4:43 PM

## 2022-05-19 NOTE — Discharge Summary (Signed)
Physician Discharge Summary   Patient ID: Barbara Russell MRN: [id number] DOB/AGE: July 22, 1930 88 y.o.  Admit date: 05/17/2022 Discharge date: 05/20/2022  Primary Diagnosis: Unstable right TKA   Admission Diagnoses:  Past Medical History:  Diagnosis Date   Anxiety    Aortic stenosis    moderate by 08/25/21 echo   Breast cancer ([place])    Hx of BC, s/p mastectomy on right 1999, s/p tamoxifen X1 year   Chest pain    EC Stress test 12/21/11 Low risk, no ischemia, normal EF   Chronic kidney disease    Coronary artery disease    Remote Hx of CAD, normal stress test 2009   Diabetes mellitus without complication (HCC)    Diverticulitis    Dyspnea    GERD (gastroesophageal reflux disease)    No longer a problem   Hypercholesteremia    Hypertension    Insomnia    Lichen sclerosus    Low back pain    Myocardial infarction ([place]) 12/11/1998   Osteoarthrosis, unspecified whether generalized or localized, unspecified site    Pain    Several year Hx of pain/ SOB radiating to her neck   SOB (shortness of breath)    UTI (urinary tract infection)    Discharge Diagnoses:   Principal Problem:   Failed total knee arthroplasty ([place]) Active Problems:   Failed total right knee replacement ([place])  Estimated body mass index is 24.19 kg/m as calculated from the following:   Height as of this encounter: '5\' 2"'$  (1.575 m).   Weight as of this encounter: 60 kg.  Procedure:  Procedure(s) (LRB): Right knee polyethylene revision (Right)   Consults: None  HPI: The patient is an 88 year old female who has an unstable right total knee arthroplasty.  She has had significant pain and dysfunction with this, the knee has been giving out on her.  Infection workup was negative.  Bone scan was  negative for any loosening.  She presents now for polyethylene versus total knee arthroplasty revision.  Laboratory Data: Admission on 05/17/2022  Component Date Value Ref Range Status   WBC 05/18/2022 7.0  4.0 -  10.5 K/uL Final   RBC 05/18/2022 3.06 (L)  3.87 - 5.11 MIL/uL Final   Hemoglobin 05/18/2022 9.7 (L)  12.0 - 15.0 g/dL Final   HCT 05/18/2022 28.9 (L)  36.0 - 46.0 % Final   MCV 05/18/2022 94.4  80.0 - 100.0 fL Final   MCH 05/18/2022 31.7  26.0 - 34.0 pg Final   MCHC 05/18/2022 33.6  30.0 - 36.0 g/dL Final   RDW 05/18/2022 13.2  11.5 - 15.5 % Final   Platelets 05/18/2022 181  150 - 400 K/uL Final   nRBC 05/18/2022 0.0  0.0 - 0.2 % Final   Performed at [facility], [place] [address], [place], [place] [address]   Sodium 05/18/2022 133 (L)  135 - 145 mmol/L Final   Potassium 05/18/2022 3.6  3.5 - 5.1 mmol/L Final   Chloride 05/18/2022 99  98 - 111 mmol/L Final   CO2 05/18/2022 25  22 - 32 mmol/L Final   Glucose, Bld 05/18/2022 101 (H)  70 - 99 mg/dL Final   Glucose reference range applies only to samples taken after fasting for at least 8 hours.   BUN 05/18/2022 21  8 - 23 mg/dL Final   Creatinine, Ser 05/18/2022 0.83  0.44 - 1.00 mg/dL Final   Calcium 05/18/2022 8.5 (L)  8.9 - 10.3 mg/dL Final   GFR, Estimated  05/18/2022 >60  >60 mL/min Final   Comment: (NOTE) Calculated using the CKD-EPI Creatinine Equation (2021)    Anion gap 05/18/2022 9  5 - 15 Final   Performed at [facility], [place] [address], [place], [place] [address]   WBC 05/19/2022 8.3  4.0 - 10.5 K/uL Final   RBC 05/19/2022 3.32 (L)  3.87 - 5.11 MIL/uL Final   Hemoglobin 05/19/2022 10.5 (L)  12.0 - 15.0 g/dL Final   HCT 05/19/2022 30.9 (L)  36.0 - 46.0 % Final   MCV 05/19/2022 93.1  80.0 - 100.0 fL Final   MCH 05/19/2022 31.6  26.0 - 34.0 pg Final   MCHC 05/19/2022 34.0  30.0 - 36.0 g/dL Final   RDW 05/19/2022 13.2  11.5 - 15.5 % Final   Platelets 05/19/2022 220  150 - 400 K/uL Final   nRBC 05/19/2022 0.0  0.0 - 0.2 % Final   Performed at [facility], [place] [address], [place], [place] [address]  Hospital Outpatient Visit on 05/12/2022  Component Date Value Ref Range  Status   MRSA, PCR 05/12/2022 NEGATIVE  NEGATIVE Final   Staphylococcus aureus 05/12/2022 NEGATIVE  NEGATIVE Final   Comment: (NOTE) The Xpert SA Assay (FDA approved for NASAL specimens in patients 26 years of age and older), is one component of a comprehensive surveillance program. It is not intended to diagnose infection nor to guide or monitor treatment. Performed at [facility], [place] [address], [place], [place] [address]    ABO/RH(D) 05/12/2022 O POS   Final   Antibody Screen 05/12/2022 NEG   Final   Sample Expiration 05/12/2022 05/20/2022,2359   Final   Extend sample reason 05/12/2022    Final                   Value:NO TRANSFUSIONS OR PREGNANCY IN THE PAST 3 MONTHS Performed at [place] [address], [place], [place] [address]    Glucose-Capillary 05/12/2022 104 (H)  70 - 99 mg/dL Final   Glucose reference range applies only to samples taken after fasting for at least 8 hours.     X-Rays:No results found.  EKG: Orders placed or performed in visit on 04/18/22   EKG 12-Lead     Hospital Course: Barbara Russell is a 88 y.o. who was admitted to [facility]. They were brought to the operating room on 05/17/2022 and underwent Procedure(s): Right knee polyethylene revision.  Patient tolerated the procedure well and was later transferred to the recovery room and then to the orthopaedic floor for postoperative care. They were given PO and IV analgesics for pain control following their surgery. They were given 24 hours of postoperative antibiotics of  Anti-infectives (From admission, onward)    Start     Dose/Rate Route Frequency Ordered Stop   05/17/22 2000  ceFAZolin (ANCEF) IVPB 2g/100 mL premix        2 g 200 mL/hr over 30 Minutes Intravenous Every 6 hours 05/17/22 1704 05/18/22 0252   05/17/22 1145  ceFAZolin (ANCEF) IVPB 2g/100 mL premix        2 g 200 mL/hr over 30 Minutes Intravenous On call to O.R. 05/17/22 1135  05/17/22 1321      and started on DVT prophylaxis in the form of Xarelto.   PT and OT were ordered for total joint protocol. Discharge planning consulted to help with postop disposition and equipment needs. Patient had a good night on the evening  of surgery. They started to get up OOB with therapy on POD #1. Social work was consulted for SNF placement. She continued to work with therapy to maximize mobility. She was discharge to SNF on POD #3 in stable condition. Dressing/incision was clean, dry and intact.   Diet: Cardiac diet Activity: WBAT Follow-up: in 2 weeks Disposition: Skilled nursing facility Discharged Condition: stable   Discharge Instructions     Call MD / Call 911   Complete by: As directed    If you experience chest pain or shortness of breath, CALL 911 and be transported to the hospital emergency room.  If you develope a fever above 101 F, pus (white drainage) or increased drainage or redness at the wound, or calf pain, call your surgeon's office.   Change dressing   Complete by: As directed    You may remove the bulky bandage (ACE wrap and gauze) two days after surgery. You will have an adhesive waterproof bandage underneath. Leave this in place until your first follow-up appointment.   Constipation Prevention   Complete by: As directed    Drink plenty of fluids.  Prune juice may be helpful.  You may use a stool softener, such as Colace (over the counter) 100 mg twice a day.  Use MiraLax (over the counter) for constipation as needed.   Diet - low sodium heart healthy   Complete by: As directed    Do not put a pillow under the knee. Place it under the heel.   Complete by: As directed    Driving restrictions   Complete by: As directed    No driving for two weeks   Post-operative opioid taper instructions:   Complete by: As directed    POST-OPERATIVE OPIOID TAPER INSTRUCTIONS: It is important to wean off of your opioid medication as soon as possible. If you do not need  pain medication after your surgery it is ok to stop day one. Opioids include: Codeine, Hydrocodone(Norco, Vicodin), Oxycodone(Percocet, oxycontin) and hydromorphone amongst others.  Long term and even short term use of opiods can cause: Increased pain response Dependence Constipation Depression Respiratory depression And more.  Withdrawal symptoms can include Flu like symptoms Nausea, vomiting And more Techniques to manage these symptoms Hydrate well Eat regular healthy meals Stay active Use relaxation techniques(deep breathing, meditating, yoga) Do Not substitute Alcohol to help with tapering If you have been on opioids for less than two weeks and do not have pain than it is ok to stop all together.  Plan to wean off of opioids This plan should start within one week post op of your joint replacement. Maintain the same interval or time between taking each dose and first decrease the dose.  Cut the total daily intake of opioids by one tablet each day Next start to increase the time between doses. The last dose that should be eliminated is the evening dose.      TED hose   Complete by: As directed    Use stockings (TED hose) for three weeks on both leg(s).  You may remove them at night for sleeping.   Weight bearing as tolerated   Complete by: As directed       Allergies as of 05/19/2022       Reactions   Amlodipine Swelling   LEE with '5mg'$  dose Foot swelling   Benadryl [diphenhydramine Hcl (sleep)] Other (See Comments)   Hyperstimulation   Ciprofloxacin Hives   Lactose Intolerance (gi) Diarrhea   Macrobid [nitrofurantoin Monohyd Macro]  Nausea And Vomiting   Naproxen Other (See Comments)   Stomach pain   Quinine Sulfate [quinine] Hives   Rosuvastatin    headache   Sulfa Antibiotics Hives      Tramadol Other (See Comments)   unknown to patient   Penicillins Rash   Has patient had a PCN reaction causing immediate rash, facial/tongue/throat swelling, SOB or  lightheadedness with hypotension: Yes Has patient had a PCN reaction causing severe rash involving mucus membranes or skin necrosis: No Has patient had a PCN reaction that required hospitalization: No Has patient had a PCN reaction occurring within the last 10 years: No If all of the above answers are "NO", then may proceed with Cephalosporin use. Tolerated Cephalosporin Date: 05/19/22.   Prednisone Other (See Comments)   Weakness and pain in joints        Medication List     STOP taking these medications    b complex vitamins tablet   cholecalciferol 1000 units tablet Commonly known as: VITAMIN D   CRANBERRY PO   Fish Oil 1200 MG Caps   PRESERVISION AREDS 2 PO       TAKE these medications    aspirin 81 MG tablet Take 1 tablet (81 mg total) by mouth daily. Start taking on: June 07, 2022 What changed: These instructions start on June 07, 2022. If you are unsure what to do until then, ask your doctor or other care provider.   cetirizine 10 MG tablet Commonly known as: ZYRTEC Take 10 mg by mouth daily.   furosemide 20 MG tablet Commonly known as: LASIX TAKE 1/2 TABLET BY MOUTH AS NEEDED What changed: See the new instructions.   gabapentin 300 MG capsule Commonly known as: NEURONTIN Take 600 mg by mouth at bedtime.   hydrALAZINE 25 MG tablet Commonly known as: APRESOLINE Take 25 mg by mouth daily as needed (blood pressure of 160 or above).   hydrochlorothiazide 25 MG tablet Commonly known as: HYDRODIURIL TAKE 1 TABLET BY MOUTH EVERY DAY   HYDROcodone-acetaminophen 5-325 MG tablet Commonly known as: NORCO/VICODIN Take 1-2 tablets by mouth every 6 (six) hours as needed for moderate pain.   isosorbide mononitrate 30 MG 24 hr tablet Commonly known as: IMDUR TAKE 1 TABLET BY MOUTH EVERY DAY   losartan 50 MG tablet Commonly known as: COZAAR Take 1 tablet (50 mg total) by mouth 2 (two) times daily.   methocarbamol 500 MG tablet Commonly known as:  ROBAXIN Take 1 tablet (500 mg total) by mouth every 6 (six) hours as needed for muscle spasms.   nitroGLYCERIN 0.4 MG SL tablet Commonly known as: NITROSTAT PLACE 1 TABLET UNDER THE TONGUE EVERY 5 MINUTES AS NEEDED FOR CHEST PAIN.   ondansetron 4 MG tablet Commonly known as: ZOFRAN Take 4 mg by mouth every 8 (eight) hours as needed for vomiting or nausea.   OneTouch Ultra test strip Generic drug: glucose blood CHECK ONCE DAILY AS DIRECTED   rivaroxaban 10 MG Tabs tablet Commonly known as: XARELTO Take 1 tablet (10 mg total) by mouth daily with breakfast for 19 days. Then resume one 81 mg aspirin once a day.   saccharomyces boulardii 250 MG capsule Commonly known as: FLORASTOR Take 250 mg by mouth daily.   Salonpas 3.12-16-08 % Ptch Generic drug: Camphor-Menthol-Methyl Sal Place 1 patch onto the skin daily as needed (knee pain).   SYSTANE OP Place 1 drop into both eyes in the morning and at bedtime.   zolpidem 5 MG tablet Commonly known as:  AMBIEN Take 7.5 mg by mouth at bedtime.               Discharge Care Instructions  (From admission, onward)           Start     Ordered   05/19/22 0000  Weight bearing as tolerated        05/19/22 0743   05/19/22 0000  Change dressing       Comments: You may remove the bulky bandage (ACE wrap and gauze) two days after surgery. You will have an adhesive waterproof bandage underneath. Leave this in place until your first follow-up appointment.   05/19/22 0743            Contact information for follow-up providers     Aluisio, Pilar Plate, MD. Schedule an appointment as soon as possible for a visit in 2 week(s).   Specialty: Orthopedic Surgery Contact information: [address] [place] [address] [phone]              Contact information for after-discharge care     [facility] .   Service: Skilled Nursing Contact information: [address] [place] [phone]                     Signed: Theresa Duty, PA-C Orthopedic Surgery 05/19/2022, 10:10 AM  Addendum Patient seen on rounds on day of discharge.  She was ambulating with physical therapy.  No immediate needs.  She looks stable for discharge as planned to skilled facility today.  Condition on discharge was stable.

## 2022-05-19 NOTE — Progress Notes (Signed)
Physical Therapy Treatment Patient Details Name: Barbara Russell MRN: [id number] DOB: [birth year] Today's Date: 05/19/2022   History of Present Illness Pt s/p L TKR revision and with hx of  aortic stenosis, MI, CKD, DM, and R TKR (2010).    PT Comments    Pt in good spirits, with improved pain control and with marked improvement in activity tolerance.  Recommendations for follow up therapy are one component of a multi-disciplinary discharge planning process, led by the attending physician.  Recommendations may be updated based on patient status, additional functional criteria and insurance authorization.  Follow Up Recommendations  Skilled nursing-short term rehab (<3 hours/day)     Assistance Recommended at Discharge Frequent or constant Supervision/Assistance  Patient can return home with the following A lot of help with walking and/or transfers;A lot of help with bathing/dressing/bathroom;Assistance with cooking/housework;Assist for transportation;Help with stairs or ramp for entrance   Equipment Recommendations       Recommendations for Other Services       Precautions / Restrictions Precautions Precautions: Fall Required Braces or Orthoses: Knee Immobilizer - Right Knee Immobilizer - Right: Discontinue once straight leg raise with < 10 degree lag Restrictions Weight Bearing Restrictions: No RLE Weight Bearing: Weight bearing as tolerated     Mobility  Bed Mobility Overal bed mobility: Needs Assistance Bed Mobility: Supine to Sit     Supine to sit: Min assist     General bed mobility comments: Increased time with assist to manage R LE and to control trunk.  Use of bed pad to complete rotation into bed    Transfers Overall transfer level: Needs assistance Equipment used: Rolling walker (2 wheels) Transfers: Sit to/from Stand Sit to Stand: Min assist, Mod assist           General transfer comment: cues for LE management and use of UEs to self assist.   Physical assist to bring wt up and fwd and to balance in standing.    Ambulation/Gait Ambulation/Gait assistance: Min assist Gait Distance (Feet): 70 Feet Assistive device: Rolling walker (2 wheels) Gait Pattern/deviations: Step-to pattern, Decreased step length - right, Decreased step length - left, Shuffle, Trunk flexed, Antalgic Gait velocity: decr     General Gait Details: Increased time with cues for sequence, posture and position from [employer]             Wheelchair Mobility    Modified Rankin (Stroke Patients Only)       Balance Overall balance assessment: Needs assistance Sitting-balance support: No upper extremity supported, Feet supported Sitting balance-Leahy Scale: Fair     Standing balance support: Single extremity supported Standing balance-Leahy Scale: Poor                              Cognition Arousal/Alertness: Awake/alert Behavior During Therapy: WFL for tasks assessed/performed Overall Cognitive Status: Within Functional Limits for tasks assessed                                          Exercises Total Joint Exercises Ankle Circles/Pumps: AROM, Both, 15 reps, Supine Quad Sets: AROM, Both, 10 reps, Supine Heel Slides: AAROM, Right, 15 reps, Supine Straight Leg Raises: AAROM, Right, 15 reps, Supine Goniometric ROM: AAROM R knee    General Comments        Pertinent Vitals/Pain Pain Assessment Pain  Assessment: 0-10 Pain Score: 4  Pain Location: R knee and at times extending up to hip Pain Descriptors / Indicators: Aching, Sore Pain Intervention(s): Limited activity within patient's tolerance, Monitored during session, Premedicated before session, Ice applied    Home Living                          Prior Function            PT Goals (current goals can now be found in the care plan section) Acute Rehab PT Goals Patient Stated Goal: Regain IND PT Goal Formulation: With patient Time For  Goal Achievement: 06/01/22 Potential to Achieve Goals: Fair Progress towards PT goals: Progressing toward goals    Frequency    7X/week      PT Plan Current plan remains appropriate    Co-evaluation              AM-PAC PT "6 Clicks" Mobility   Outcome Measure  Help needed turning from your back to your side while in a flat bed without using bedrails?: A Little Help needed moving from lying on your back to sitting on the side of a flat bed without using bedrails?: A Little Help needed moving to and from a bed to a chair (including a wheelchair)?: A Little Help needed standing up from a chair using your arms (e.g., wheelchair or bedside chair)?: A Little Help needed to walk in hospital room?: A Little Help needed climbing 3-5 steps with a railing? : A Lot 6 Click Score: 17    End of Session Equipment Utilized During Treatment: Gait belt;Right knee immobilizer Activity Tolerance: Patient tolerated treatment well Patient left: in chair;with call bell/phone within reach;with chair alarm set;with family/visitor present Nurse Communication: Mobility status PT Visit Diagnosis: Difficulty in walking, not elsewhere classified (R26.2);Pain Pain - Right/Left: Right Pain - part of body: Knee     Time: 5427-0623 PT Time Calculation (min) (ACUTE ONLY): 32 min  Charges:  $Gait Training: 8-22 mins $Therapeutic Exercise: 8-22 mins                     [place] Pager [phone] Office [phone]    Addalynn Kumari 05/19/2022, 1:41 PM

## 2022-05-19 NOTE — TOC Progression Note (Signed)
Transition of Care [facility]) - Progression Note   Patient Details  Name: AARIYA FERRICK MRN: [id number] Date of Birth: [birth year]  [facility]) CM/SW Normal, LCSW Phone Number: 05/19/2022, 10:42 AM  Clinical Narrative: Family was considering having the patient admitted to [facility] today rather than waiting for the 3 midnight inpatient stay and discharging tomorrow. CSW spoke with Juliann Pulse in admissions at [facility] and confirmed the family will need to pay $10,000 up front prior to admission if not coming under Medicare. Family and patient prefer to wait until tomorrow. CSW updated Juliann Pulse at [facility].    Expected Discharge Plan: [place] Barriers to Discharge: Continued Medical Work up, SNF Pending bed offer  Expected Discharge Plan and Services Expected Discharge Plan: [place] In-house Referral: Clinical Social Work Post Acute Care Choice: [place] arrangements for the past 2 months: [place] Expected Discharge Date: 05/19/22               DME Arranged: N/A DME Agency: NA  Readmission Risk Interventions     No data to display

## 2022-05-20 NOTE — Plan of Care (Signed)

## 2022-05-20 NOTE — Progress Notes (Signed)
Physical Therapy Treatment Patient Details Name: Barbara Russell MRN: [id number] DOB: 1930/06/21 Today's Date: 05/20/2022   History of Present Illness Pt s/p L TKR revision and with hx of  aortic stenosis, MI, CKD, DM, and R TKR (2010).    PT Comments    Pt continues pleasant and motivated to progress with noted improvement in gait stability this am.  Pt up to ambulate in halls and performed therex program with assist.  Pt eager for dc to rehab setting and then home.   Recommendations for follow up therapy are one component of a multi-disciplinary discharge planning process, led by the attending physician.  Recommendations may be updated based on patient status, additional functional criteria and insurance authorization.  Follow Up Recommendations  Skilled nursing-short term rehab (<3 hours/day)     Assistance Recommended at Discharge Frequent or constant Supervision/Assistance  Patient can return home with the following Assistance with cooking/housework;Assist for transportation;Help with stairs or ramp for entrance;A little help with walking and/or transfers;A little help with bathing/dressing/bathroom   Equipment Recommendations       Recommendations for Other Services       Precautions / Restrictions Precautions Precautions: Fall Required Braces or Orthoses: Knee Immobilizer - Right Knee Immobilizer - Right: Discontinue once straight leg raise with < 10 degree lag Restrictions Weight Bearing Restrictions: No RLE Weight Bearing: Weight bearing as tolerated Other Position/Activity Restrictions: WBAT     Mobility  Bed Mobility               General bed mobility comments: Pt up in chair and requests back to same    Transfers Overall transfer level: Needs assistance Equipment used: Rolling walker (2 wheels) Transfers: Sit to/from Stand Sit to Stand: Min assist                Ambulation/Gait Ambulation/Gait assistance: Min assist, Min guard Gait Distance  (Feet): 100 Feet Assistive device: Rolling walker (2 wheels) Gait Pattern/deviations: Decreased step length - right, Decreased step length - left, Shuffle, Trunk flexed, Antalgic, Step-to pattern, Step-through pattern Gait velocity: decr     General Gait Details: min cues for sequence, posture and position from [employer] Mobility    Modified Rankin (Stroke Patients Only)       Balance Overall balance assessment: Needs assistance Sitting-balance support: No upper extremity supported, Feet supported Sitting balance-Leahy Scale: Good     Standing balance support: Single extremity supported Standing balance-Leahy Scale: Poor                              Cognition Arousal/Alertness: Awake/alert Behavior During Therapy: WFL for tasks assessed/performed Overall Cognitive Status: Within Functional Limits for tasks assessed                                          Exercises Total Joint Exercises Ankle Circles/Pumps: AROM, Both, 15 reps, Supine Quad Sets: AROM, Both, 10 reps, Supine Heel Slides: AAROM, Right, 15 reps, Supine Straight Leg Raises: AAROM, Right, Supine, 20 reps Goniometric ROM: AAROM R knee -4 - 75    General Comments        Pertinent Vitals/Pain Pain Assessment Pain Assessment: 0-10 Pain Score: 5  Pain Location: R knee Pain Descriptors / Indicators: Aching, Sore Pain Intervention(s):  Limited activity within patient's tolerance, Monitored during session, Premedicated before session, Ice applied    Home Living                          Prior Function            PT Goals (current goals can now be found in the care plan section) Acute Rehab PT Goals Patient Stated Goal: Regain IND PT Goal Formulation: With patient Time For Goal Achievement: 06/01/22 Potential to Achieve Goals: Good Progress towards PT goals: Progressing toward goals    Frequency    7X/week      PT Plan  Current plan remains appropriate    Co-evaluation              AM-PAC PT "6 Clicks" Mobility   Outcome Measure  Help needed turning from your back to your side while in a flat bed without using bedrails?: A Little Help needed moving from lying on your back to sitting on the side of a flat bed without using bedrails?: A Little Help needed moving to and from a bed to a chair (including a wheelchair)?: A Little Help needed standing up from a chair using your arms (e.g., wheelchair or bedside chair)?: A Little Help needed to walk in hospital room?: A Little Help needed climbing 3-5 steps with a railing? : A Lot 6 Click Score: 17    End of Session Equipment Utilized During Treatment: Gait belt;Right knee immobilizer Activity Tolerance: Patient tolerated treatment well Patient left: in chair;with call bell/phone within reach;with chair alarm set;with family/visitor present Nurse Communication: Mobility status PT Visit Diagnosis: Difficulty in walking, not elsewhere classified (R26.2);Pain Pain - Right/Left: Right Pain - part of body: Knee     Time: 2202-5427 PT Time Calculation (min) (ACUTE ONLY): 30 min  Charges:  $Gait Training: 8-22 mins $Therapeutic Exercise: 8-22 mins                     [place] Pager [phone] Office [phone]    Careen Mauch 05/20/2022, 10:59 AM

## 2022-05-20 NOTE — TOC Transition Note (Signed)
Transition of Care [facility]) - CM/SW Discharge Note   Patient Details  Name: Barbara Russell MRN: [id number] Date of Birth: Oct 09, 1930  [facility] CM/SW Contact:  Ross Ludwig, LCSW Phone Number: 05/20/2022, 10:04 AM   Clinical Narrative:     CSW was informed that patient can go to [facility] today.  Patient to be d/c'ed today to [facility] room 111.  Patient and family agreeable to plans will transport via ems RN to call report [phone].  Patient's son Inocente Salles was made aware that patient is discharging today.     Final next level of care: Skilled Nursing Facility Barriers to Discharge: Barriers Resolved   Patient Goals and CMS Choice Patient states their goals for this hospitalization and ongoing recovery are:: To go to SNF for short term rehab, then return back home. CMS Medicare.gov Compare Post Acute Care list provided to:: Patient Choice offered to / list presented to : Patient  Discharge Placement PASRR number recieved: 05/18/22            Patient [facility] bed at: [facility] Patient to be transferred to facility by: [facility] Name of family member notified: Blanchard Kelch [phone] Patient and family notified of of transfer: 05/20/22  Discharge Plan and Services In-house Referral: Clinical Social Work   Post Acute Care Choice: [place]          DME Arranged: N/A DME Agency: NA                  Social Determinants of Health ([place]) Interventions     Readmission Risk Interventions     No data to display

## 2022-05-20 NOTE — Progress Notes (Addendum)
10:73 Called facility to give report, no answer.  10:55 Attempted to give report again, transferred by front desk to nurse's station, no answer.  11:34 Pt not in acute distress, son at bedside, discharged to SNF with belongings via [place].  11:44 Received call from [facility]), report given, all questions answered.

## 2022-05-22 DIAGNOSIS — T84012D Broken internal right knee prosthesis, subsequent encounter: Secondary | ICD-10-CM | POA: Diagnosis not present

## 2022-05-22 DIAGNOSIS — E46 Unspecified protein-calorie malnutrition: Secondary | ICD-10-CM | POA: Diagnosis not present

## 2022-05-22 DIAGNOSIS — E1159 Type 2 diabetes mellitus with other circulatory complications: Secondary | ICD-10-CM | POA: Diagnosis not present

## 2022-05-22 DIAGNOSIS — Z471 Aftercare following joint replacement surgery: Secondary | ICD-10-CM | POA: Diagnosis not present

## 2022-05-23 DIAGNOSIS — Z853 Personal history of malignant neoplasm of breast: Secondary | ICD-10-CM | POA: Diagnosis not present

## 2022-05-23 DIAGNOSIS — T84022D Instability of internal right knee prosthesis, subsequent encounter: Secondary | ICD-10-CM | POA: Diagnosis not present

## 2022-05-23 DIAGNOSIS — Z87891 Personal history of nicotine dependence: Secondary | ICD-10-CM | POA: Diagnosis not present

## 2022-05-23 DIAGNOSIS — M545 Low back pain, unspecified: Secondary | ICD-10-CM | POA: Diagnosis not present

## 2022-05-23 DIAGNOSIS — I10 Essential (primary) hypertension: Secondary | ICD-10-CM | POA: Diagnosis not present

## 2022-05-23 DIAGNOSIS — I2510 Atherosclerotic heart disease of native coronary artery without angina pectoris: Secondary | ICD-10-CM | POA: Diagnosis not present

## 2022-05-23 DIAGNOSIS — Z9181 History of falling: Secondary | ICD-10-CM | POA: Diagnosis not present

## 2022-05-23 DIAGNOSIS — M1990 Unspecified osteoarthritis, unspecified site: Secondary | ICD-10-CM | POA: Diagnosis not present

## 2022-05-23 DIAGNOSIS — Z7901 Long term (current) use of anticoagulants: Secondary | ICD-10-CM | POA: Diagnosis not present

## 2022-05-25 DIAGNOSIS — Z87891 Personal history of nicotine dependence: Secondary | ICD-10-CM | POA: Diagnosis not present

## 2022-05-25 DIAGNOSIS — I2510 Atherosclerotic heart disease of native coronary artery without angina pectoris: Secondary | ICD-10-CM | POA: Diagnosis not present

## 2022-05-25 DIAGNOSIS — I10 Essential (primary) hypertension: Secondary | ICD-10-CM | POA: Diagnosis not present

## 2022-05-25 DIAGNOSIS — M1990 Unspecified osteoarthritis, unspecified site: Secondary | ICD-10-CM | POA: Diagnosis not present

## 2022-05-25 DIAGNOSIS — T84022D Instability of internal right knee prosthesis, subsequent encounter: Secondary | ICD-10-CM | POA: Diagnosis not present

## 2022-05-25 DIAGNOSIS — M545 Low back pain, unspecified: Secondary | ICD-10-CM | POA: Diagnosis not present

## 2022-05-31 DIAGNOSIS — I10 Essential (primary) hypertension: Secondary | ICD-10-CM | POA: Diagnosis not present

## 2022-05-31 DIAGNOSIS — M1990 Unspecified osteoarthritis, unspecified site: Secondary | ICD-10-CM | POA: Diagnosis not present

## 2022-05-31 DIAGNOSIS — I2510 Atherosclerotic heart disease of native coronary artery without angina pectoris: Secondary | ICD-10-CM | POA: Diagnosis not present

## 2022-05-31 DIAGNOSIS — T84022D Instability of internal right knee prosthesis, subsequent encounter: Secondary | ICD-10-CM | POA: Diagnosis not present

## 2022-05-31 DIAGNOSIS — Z87891 Personal history of nicotine dependence: Secondary | ICD-10-CM | POA: Diagnosis not present

## 2022-05-31 DIAGNOSIS — M545 Low back pain, unspecified: Secondary | ICD-10-CM | POA: Diagnosis not present

## 2022-06-02 DIAGNOSIS — I10 Essential (primary) hypertension: Secondary | ICD-10-CM | POA: Diagnosis not present

## 2022-06-02 DIAGNOSIS — M545 Low back pain, unspecified: Secondary | ICD-10-CM | POA: Diagnosis not present

## 2022-06-02 DIAGNOSIS — I2510 Atherosclerotic heart disease of native coronary artery without angina pectoris: Secondary | ICD-10-CM | POA: Diagnosis not present

## 2022-06-02 DIAGNOSIS — Z87891 Personal history of nicotine dependence: Secondary | ICD-10-CM | POA: Diagnosis not present

## 2022-06-02 DIAGNOSIS — T84022D Instability of internal right knee prosthesis, subsequent encounter: Secondary | ICD-10-CM | POA: Diagnosis not present

## 2022-06-02 DIAGNOSIS — M1990 Unspecified osteoarthritis, unspecified site: Secondary | ICD-10-CM | POA: Diagnosis not present

## 2022-06-05 DIAGNOSIS — I2510 Atherosclerotic heart disease of native coronary artery without angina pectoris: Secondary | ICD-10-CM | POA: Diagnosis not present

## 2022-06-05 DIAGNOSIS — M1990 Unspecified osteoarthritis, unspecified site: Secondary | ICD-10-CM | POA: Diagnosis not present

## 2022-06-05 DIAGNOSIS — Z87891 Personal history of nicotine dependence: Secondary | ICD-10-CM | POA: Diagnosis not present

## 2022-06-05 DIAGNOSIS — M545 Low back pain, unspecified: Secondary | ICD-10-CM | POA: Diagnosis not present

## 2022-06-05 DIAGNOSIS — I10 Essential (primary) hypertension: Secondary | ICD-10-CM | POA: Diagnosis not present

## 2022-06-05 DIAGNOSIS — T84022D Instability of internal right knee prosthesis, subsequent encounter: Secondary | ICD-10-CM | POA: Diagnosis not present

## 2022-06-07 DIAGNOSIS — M1990 Unspecified osteoarthritis, unspecified site: Secondary | ICD-10-CM | POA: Diagnosis not present

## 2022-06-07 DIAGNOSIS — T84022D Instability of internal right knee prosthesis, subsequent encounter: Secondary | ICD-10-CM | POA: Diagnosis not present

## 2022-06-07 DIAGNOSIS — M545 Low back pain, unspecified: Secondary | ICD-10-CM | POA: Diagnosis not present

## 2022-06-07 DIAGNOSIS — I2510 Atherosclerotic heart disease of native coronary artery without angina pectoris: Secondary | ICD-10-CM | POA: Diagnosis not present

## 2022-06-07 DIAGNOSIS — I10 Essential (primary) hypertension: Secondary | ICD-10-CM | POA: Diagnosis not present

## 2022-06-07 DIAGNOSIS — Z87891 Personal history of nicotine dependence: Secondary | ICD-10-CM | POA: Diagnosis not present

## 2022-06-12 DIAGNOSIS — I2510 Atherosclerotic heart disease of native coronary artery without angina pectoris: Secondary | ICD-10-CM | POA: Diagnosis not present

## 2022-06-12 DIAGNOSIS — Z87891 Personal history of nicotine dependence: Secondary | ICD-10-CM | POA: Diagnosis not present

## 2022-06-12 DIAGNOSIS — I10 Essential (primary) hypertension: Secondary | ICD-10-CM | POA: Diagnosis not present

## 2022-06-12 DIAGNOSIS — T84022D Instability of internal right knee prosthesis, subsequent encounter: Secondary | ICD-10-CM | POA: Diagnosis not present

## 2022-06-12 DIAGNOSIS — M1990 Unspecified osteoarthritis, unspecified site: Secondary | ICD-10-CM | POA: Diagnosis not present

## 2022-06-12 DIAGNOSIS — M545 Low back pain, unspecified: Secondary | ICD-10-CM | POA: Diagnosis not present

## 2022-06-14 DIAGNOSIS — M1990 Unspecified osteoarthritis, unspecified site: Secondary | ICD-10-CM | POA: Diagnosis not present

## 2022-06-14 DIAGNOSIS — I2510 Atherosclerotic heart disease of native coronary artery without angina pectoris: Secondary | ICD-10-CM | POA: Diagnosis not present

## 2022-06-14 DIAGNOSIS — I10 Essential (primary) hypertension: Secondary | ICD-10-CM | POA: Diagnosis not present

## 2022-06-14 DIAGNOSIS — Z87891 Personal history of nicotine dependence: Secondary | ICD-10-CM | POA: Diagnosis not present

## 2022-06-14 DIAGNOSIS — M545 Low back pain, unspecified: Secondary | ICD-10-CM | POA: Diagnosis not present

## 2022-06-14 DIAGNOSIS — T84022D Instability of internal right knee prosthesis, subsequent encounter: Secondary | ICD-10-CM | POA: Diagnosis not present

## 2022-06-21 DIAGNOSIS — Z87891 Personal history of nicotine dependence: Secondary | ICD-10-CM | POA: Diagnosis not present

## 2022-06-21 DIAGNOSIS — I10 Essential (primary) hypertension: Secondary | ICD-10-CM | POA: Diagnosis not present

## 2022-06-21 DIAGNOSIS — M545 Low back pain, unspecified: Secondary | ICD-10-CM | POA: Diagnosis not present

## 2022-06-21 DIAGNOSIS — M1990 Unspecified osteoarthritis, unspecified site: Secondary | ICD-10-CM | POA: Diagnosis not present

## 2022-06-21 DIAGNOSIS — I2510 Atherosclerotic heart disease of native coronary artery without angina pectoris: Secondary | ICD-10-CM | POA: Diagnosis not present

## 2022-06-21 DIAGNOSIS — T84022D Instability of internal right knee prosthesis, subsequent encounter: Secondary | ICD-10-CM | POA: Diagnosis not present

## 2022-06-22 DIAGNOSIS — Z853 Personal history of malignant neoplasm of breast: Secondary | ICD-10-CM | POA: Diagnosis not present

## 2022-06-22 DIAGNOSIS — M545 Low back pain, unspecified: Secondary | ICD-10-CM | POA: Diagnosis not present

## 2022-06-22 DIAGNOSIS — M1990 Unspecified osteoarthritis, unspecified site: Secondary | ICD-10-CM | POA: Diagnosis not present

## 2022-06-22 DIAGNOSIS — Z7901 Long term (current) use of anticoagulants: Secondary | ICD-10-CM | POA: Diagnosis not present

## 2022-06-22 DIAGNOSIS — Z9181 History of falling: Secondary | ICD-10-CM | POA: Diagnosis not present

## 2022-06-22 DIAGNOSIS — Z87891 Personal history of nicotine dependence: Secondary | ICD-10-CM | POA: Diagnosis not present

## 2022-06-22 DIAGNOSIS — I10 Essential (primary) hypertension: Secondary | ICD-10-CM | POA: Diagnosis not present

## 2022-06-22 DIAGNOSIS — T84022D Instability of internal right knee prosthesis, subsequent encounter: Secondary | ICD-10-CM | POA: Diagnosis not present

## 2022-06-22 DIAGNOSIS — I2510 Atherosclerotic heart disease of native coronary artery without angina pectoris: Secondary | ICD-10-CM | POA: Diagnosis not present

## 2022-06-27 DIAGNOSIS — Z5189 Encounter for other specified aftercare: Secondary | ICD-10-CM | POA: Diagnosis not present

## 2022-06-27 DIAGNOSIS — M1611 Unilateral primary osteoarthritis, right hip: Secondary | ICD-10-CM | POA: Diagnosis not present

## 2022-06-28 ENCOUNTER — Other Ambulatory Visit: Payer: Self-pay | Admitting: Cardiology

## 2022-06-28 ENCOUNTER — Telehealth: Payer: Self-pay | Admitting: Pharmacist

## 2022-06-28 MED ORDER — HYDRALAZINE HCL 25 MG PO TABS: 25.0000 mg | ORAL_TABLET | Freq: Every day | ORAL | 3 refills | Status: DC | PRN

## 2022-06-28 NOTE — Telephone Encounter (Signed)
Patient called stating she had her knee replaced about 6 weeks ago. Feeling good. But her hip is hurting her. Went to see ortho who wants to do a cortisone shot in her hip. Asking if this is ok. Advised it was ok. She also states she has been more short of breath the last several weeks. She did take '20mg'$  of furosemide a couple times last week for feet swelling. This helped swelling. Not sure if it helped SOB. States SOB is when she is walking around and improves with rest. She did sound a little SOB on the phone. She states she has been up moving.  Will send message to Dr. Marlou Porch to see if he thinks she needs to be seen by APP.

## 2022-07-03 DIAGNOSIS — M1990 Unspecified osteoarthritis, unspecified site: Secondary | ICD-10-CM | POA: Diagnosis not present

## 2022-07-03 DIAGNOSIS — Z87891 Personal history of nicotine dependence: Secondary | ICD-10-CM | POA: Diagnosis not present

## 2022-07-03 DIAGNOSIS — I2510 Atherosclerotic heart disease of native coronary artery without angina pectoris: Secondary | ICD-10-CM | POA: Diagnosis not present

## 2022-07-03 DIAGNOSIS — M545 Low back pain, unspecified: Secondary | ICD-10-CM | POA: Diagnosis not present

## 2022-07-03 DIAGNOSIS — I10 Essential (primary) hypertension: Secondary | ICD-10-CM | POA: Diagnosis not present

## 2022-07-03 DIAGNOSIS — T84022D Instability of internal right knee prosthesis, subsequent encounter: Secondary | ICD-10-CM | POA: Diagnosis not present

## 2022-07-03 NOTE — Progress Notes (Deleted)
Office Visit    Patient Name: Barbara Russell Date of Encounter: 07/03/2022  Primary Care Provider:  Seward Carol, MD Primary Cardiologist:  Candee Furbish, MD Primary Electrophysiologist: None  Chief Complaint    Barbara Russell is a 88 y.o. female with PMH of HTN, CAD s/p MI in 2000, DM aortic stenosis with a heart murmur presents today for evaluation of recent shortness of breath.  Past Medical History    Past Medical History:  Diagnosis Date   Anxiety    Aortic stenosis    moderate by 08/25/21 echo   Breast cancer ([place])    Hx of BC, s/p mastectomy on right 1999, s/p tamoxifen X1 year   Chest pain    EC Stress test 12/21/11 Low risk, no ischemia, normal EF   Chronic kidney disease    Coronary artery disease    Remote Hx of CAD, normal stress test 2009   Diabetes mellitus without complication (HCC)    Diverticulitis    Dyspnea    GERD (gastroesophageal reflux disease)    No longer a problem   Hypercholesteremia    Hypertension    Insomnia    Lichen sclerosus    Low back pain    Myocardial infarction ([place]) 12/11/1998   Osteoarthrosis, unspecified whether generalized or localized, unspecified site    Pain    Several year Hx of pain/ SOB radiating to her neck   SOB (shortness of breath)    UTI (urinary tract infection)    Past Surgical History:  Procedure Laterality Date   ABDOMINAL HYSTERECTOMY     APPENDECTOMY     HAND SURGERY Bilateral    CTS and trigger finger on right and left   MASTECTOMY Right 12/11/1997   REPLACEMENT TOTAL KNEE Right 11/10/2009   TOTAL KNEE REVISION Right 05/17/2022   Procedure: Right knee polyethylene revision;  Surgeon: Gaynelle Arabian, MD;  Location: [facility]S;  Service: Orthopedics;  Laterality: Right;   VESICOVAGINAL FISTULA CLOSURE W/ TAH  12/12/1967    Allergies  Allergies  Allergen Reactions   Amlodipine Swelling    LEE with '5mg'$  dose  Foot swelling   Benadryl [Diphenhydramine Hcl (Sleep)] Other (See Comments)     Hyperstimulation   Ciprofloxacin Hives   Lactose Intolerance (Gi) Diarrhea   Macrobid [Nitrofurantoin Monohyd Macro] Nausea And Vomiting   Naproxen Other (See Comments)    Stomach pain   Quinine Sulfate [Quinine] Hives   Rosuvastatin     headache   Sulfa Antibiotics Hives        Tramadol Other (See Comments)    unknown to patient   Penicillins Rash    Has patient had a PCN reaction causing immediate rash, facial/tongue/throat swelling, SOB or lightheadedness with hypotension: Yes Has patient had a PCN reaction causing severe rash involving mucus membranes or skin necrosis: No Has patient had a PCN reaction that required hospitalization: No Has patient had a PCN reaction occurring within the last 10 years: No If all of the above answers are "NO", then may proceed with Cephalosporin use. Tolerated Cephalosporin Date: 05/19/22.     Prednisone Other (See Comments)    Weakness and pain in joints    History of Present Illness    Barbara Russell is a 88 year old female with the above-mentioned past medical history who presents today for evaluation of recent shortness of breath with exertion.  She was initially seen by Dr. Marlou Porch in 2015 for evaluation of chest pain.  2D echo was completed that  revealed mild aortic stenosis and normal EF.  In 2016 patient was seen in the ED for complaint of sharp pain in her head.  She was found to have blood pressure of 220/95.  Blood pressure resolved after being seen in ED.  She is currently being followed by hypertension clinic for management of blood pressure.  Patient had a complaint of chest pain in 03/2020 and underwent nuclear stress test that was normal and had an EF of 79%.  She was last seen by Dr. Marlou Porch on 04/18/2022 for preoperative evaluation for upcoming knee surgery.  Patient had no complaints of chest pain or shortness of breath and denied any acute symptoms.  No medication changes were made during visit.  She underwent knee procedure in  05/2022.   Since last being seen in the office patient reports***.  Patient denies chest pain, palpitations, dyspnea, PND, orthopnea, nausea, vomiting, dizziness, syncope, edema, weight gain, or early satiety.     ***Notes: -Recent shortness of breath with activity, responded well to Lasix  -Noted to have worsening aortic stenosis patient may need  beta blocker or increase inLasix 10 mg as needed and losartan 50 mg -Xarelto x19 days now on ASA 81 mg Home Medications    Current Outpatient Medications  Medication Sig Dispense Refill   aspirin 81 MG tablet Take 1 tablet (81 mg total) by mouth daily. 30 tablet 0   Camphor-Menthol-Methyl Sal (SALONPAS) 3.12-16-08 % PTCH Place 1 patch onto the skin daily as needed (knee pain).     cetirizine (ZYRTEC) 10 MG tablet Take 10 mg by mouth daily.     furosemide (LASIX) 20 MG tablet TAKE 1/2 TABLET BY MOUTH AS NEEDED (Patient taking differently: Take 10 mg by mouth daily.) 45 tablet 2   gabapentin (NEURONTIN) 300 MG capsule Take 600 mg by mouth at bedtime.      hydrALAZINE (APRESOLINE) 25 MG tablet Take 1 tablet (25 mg total) by mouth daily as needed (blood pressure of 160 or above). 90 tablet 3   hydrochlorothiazide (HYDRODIURIL) 25 MG tablet TAKE 1 TABLET BY MOUTH EVERY DAY 90 tablet 2   HYDROcodone-acetaminophen (NORCO/VICODIN) 5-325 MG tablet Take 1-2 tablets by mouth every 6 (six) hours as needed for moderate pain. 30 tablet 0   isosorbide mononitrate (IMDUR) 30 MG 24 hr tablet TAKE 1 TABLET BY MOUTH EVERY DAY 90 tablet 2   losartan (COZAAR) 50 MG tablet Take 1 tablet (50 mg total) by mouth 2 (two) times daily. 180 tablet 3   methocarbamol (ROBAXIN) 500 MG tablet Take 1 tablet (500 mg total) by mouth every 6 (six) hours as needed for muscle spasms. 40 tablet 0   nitroGLYCERIN (NITROSTAT) 0.4 MG SL tablet PLACE 1 TABLET UNDER THE TONGUE EVERY 5 MINUTES AS NEEDED FOR CHEST PAIN. 25 tablet 1   ondansetron (ZOFRAN) 4 MG tablet Take 4 mg by mouth every 8  (eight) hours as needed for vomiting or nausea.     ONETOUCH ULTRA test strip CHECK ONCE DAILY AS DIRECTED     Polyethyl Glycol-Propyl Glycol (SYSTANE OP) Place 1 drop into both eyes in the morning and at bedtime.     rivaroxaban (XARELTO) 10 MG TABS tablet Take 1 tablet (10 mg total) by mouth daily with breakfast for 19 days. Then resume one 81 mg aspirin once a day. 19 tablet 0   saccharomyces boulardii (FLORASTOR) 250 MG capsule Take 250 mg by mouth daily.     zolpidem (AMBIEN) 5 MG tablet Take 7.5 mg by  mouth at bedtime.     No current facility-administered medications for this visit.     Review of Systems  Please see the history of present illness.    (+)*** (+)***  All other systems reviewed and are otherwise negative except as noted above.  Physical Exam    Wt Readings from Last 3 Encounters:  05/17/22 132 lb 4.4 oz (60 kg)  05/12/22 133 lb (60.3 kg)  04/18/22 133 lb (60.3 kg)   AV:WUJWJ were no vitals filed for this visit.,There is no height or weight on file to calculate BMI.  Constitutional:      Appearance: Healthy appearance. Not in distress.  Neck:     Vascular: JVD normal.  Pulmonary:     Effort: Pulmonary effort is normal.     Breath sounds: No wheezing. No rales. Diminished in the bases Cardiovascular:     Normal rate. Regular rhythm. Normal S1. Normal S2.      Murmurs: There is no murmur.  Edema:    Peripheral edema absent.  Abdominal:     Palpations: Abdomen is soft non tender. There is no hepatomegaly.  Skin:    General: Skin is warm and dry.  Neurological:     General: No focal deficit present.     Mental Status: Alert and oriented to person, place and time.     Cranial Nerves: Cranial nerves are intact.  EKG/LABS/Other Studies Reviewed    ECG personally reviewed by me today - ***  Risk Assessment/Calculations:   {Does this patient have ATRIAL FIBRILLATION?:[phone]}        Lab Results  Component Value Date   WBC 8.3 05/19/2022   HGB  10.5 (L) 05/19/2022   HCT 30.9 (L) 05/19/2022   MCV 93.1 05/19/2022   PLT 220 05/19/2022   Lab Results  Component Value Date   CREATININE 0.83 05/18/2022   BUN 21 05/18/2022   NA 133 (L) 05/18/2022   K 3.6 05/18/2022   CL 99 05/18/2022   CO2 25 05/18/2022   Lab Results  Component Value Date   ALT 22 12/02/2009   AST 24 12/02/2009   ALKPHOS 92 12/02/2009   BILITOT 0.8 12/02/2009   No results found for: "CHOL", "HDL", "LDLCALC", "LDLDIRECT", "TRIG", "CHOLHDL"  No results found for: "HGBA1C"  Assessment & Plan    1.  Shortness of breath: -2D echo was completed 2022 with normal EF and grade 1 DD with worsening aortic stenosis -Continue to maintain optimal BP and volume control -Patient reported SOB,   2.  Aortic stenosis: -As noted above with plan to repeat echo for evaluation in 1 year  3.  Coronary artery disease: - s/p MI in 2000 with no***reported today -Continue GDMT with ASA 81 mg, Imdur 30 mg,  4.  Primary hypertension: -Blood pressure today was      Disposition: Follow-up with Candee Furbish, MD or APP in *** months {Are you ordering a CV Procedure (e.g. stress test, cath, DCCV, TEE, etc)?   Press F2        :[id number]}   Medication Adjustments/Labs and Tests Ordered: Current medicines are reviewed at length with the patient today.  Concerns regarding medicines are outlined above.   Signed, Mable Fill, Marissa Nestle, NP 07/03/2022, 10:18 AM [place]

## 2022-07-05 ENCOUNTER — Ambulatory Visit: Payer: Medicare Other | Admitting: Nurse Practitioner

## 2022-07-07 ENCOUNTER — Ambulatory Visit (INDEPENDENT_AMBULATORY_CARE_PROVIDER_SITE_OTHER): Payer: Medicare Other | Admitting: Cardiology

## 2022-07-07 ENCOUNTER — Ambulatory Visit: Payer: Medicare Other | Admitting: Nurse Practitioner

## 2022-07-07 ENCOUNTER — Encounter: Payer: Self-pay | Admitting: Cardiology

## 2022-07-07 VITALS — BP 121/50 | HR 77 | Ht 63.0 in | Wt 130.0 lb

## 2022-07-07 DIAGNOSIS — I2510 Atherosclerotic heart disease of native coronary artery without angina pectoris: Secondary | ICD-10-CM

## 2022-07-07 DIAGNOSIS — R079 Chest pain, unspecified: Secondary | ICD-10-CM

## 2022-07-07 DIAGNOSIS — R011 Cardiac murmur, unspecified: Secondary | ICD-10-CM | POA: Diagnosis not present

## 2022-07-07 DIAGNOSIS — I2583 Coronary atherosclerosis due to lipid rich plaque: Secondary | ICD-10-CM

## 2022-07-07 NOTE — Patient Instructions (Signed)
Medication Instructions:  The current medical regimen is effective;  continue present plan and medications.  *If you need a refill on your cardiac medications before your next appointment, please call your pharmacy*  Testing/Procedures: Your physician has requested that you have an echocardiogram. Echocardiography is a painless test that uses sound waves to create images of your heart. It provides your doctor with information about the size and shape of your heart and how well your heart's chambers and valves are working. This procedure takes approximately one hour. There are no restrictions for this procedure.    Follow-Up: At [facility], you and your health needs are our priority.  As part of our continuing mission to provide you with exceptional heart care, we have created designated Provider Care Teams.  These Care Teams include your primary Cardiologist (physician) and Advanced Practice Providers (APPs -  Physician Assistants and Nurse Practitioners) who all work together to provide you with the care you need, when you need it.  We recommend signing up for the patient portal called "MyChart".  Sign up information is provided on this After Visit Summary.  MyChart is used to connect with patients for Virtual Visits (Telemedicine).  Patients are able to view lab/test results, encounter notes, upcoming appointments, etc.  Non-urgent messages can be sent to your provider as well.   To learn more about what you can do with MyChart, go to [email or web address].    Your next appointment:   Follow up as scheduled with Dr Marlou Porch   Important Information About Sugar

## 2022-07-07 NOTE — Progress Notes (Signed)
Cardiology Office Note:    Date:  07/07/2022   ID:  Barbara Russell, DOB 09/18/1930, MRN [id number]  PCP:  Seward Carol, MD   [facility] Providers Cardiologist:  Candee Furbish, MD     Referring MD: Seward Carol, MD    History of Present Illness:    Barbara Russell is a 88 y.o. female here for the follow-up of hypertension with post op knee repair (Dr. Wynelle Link). Hip was worse. Injection upcoming 8/9. Notes some SOB. Left sided chest pain about a week ago. Took 2 NTG.  She is here today with her son from [place].  Her son's daughter is married to [place] who took piano lessons with my mother.  Prior chest discomfort in April 2021 with nuclear stress test that showed no abnormalities.    She and Melissa have worked on her blood pressure.  She had asked for losartan to be split to 50 twice daily for ease of use.  Sometimes she experiences low blood pressure.  Sometimes she experiences high and takes the hydralazine 25.  She is also been using Advil for pain.  She has stopped driving because of immobility issues.  Had CDIFF prior year.  Now in senior living   Past Medical History:  Diagnosis Date   Anxiety    Aortic stenosis    moderate by 08/25/21 echo   Breast cancer ([place])    Hx of BC, s/p mastectomy on right 1999, s/p tamoxifen X1 year   Chest pain    EC Stress test 12/21/11 Low risk, no ischemia, normal EF   Chronic kidney disease    Coronary artery disease    Remote Hx of CAD, normal stress test 2009   Diabetes mellitus without complication (HCC)    Diverticulitis    Dyspnea    GERD (gastroesophageal reflux disease)    No longer a problem   Hypercholesteremia    Hypertension    Insomnia    Lichen sclerosus    Low back pain    Myocardial infarction ([place]) 12/11/1998   Osteoarthrosis, unspecified whether generalized or localized, unspecified site    Pain    Several year Hx of pain/ SOB radiating to her neck   SOB (shortness of breath)    UTI (urinary tract  infection)     Past Surgical History:  Procedure Laterality Date   ABDOMINAL HYSTERECTOMY     APPENDECTOMY     HAND SURGERY Bilateral    CTS and trigger finger on right and left   MASTECTOMY Right 12/11/1997   REPLACEMENT TOTAL KNEE Right 11/10/2009   TOTAL KNEE REVISION Right 05/17/2022   Procedure: Right knee polyethylene revision;  Surgeon: Gaynelle Arabian, MD;  Location: [facility];  Service: Orthopedics;  Laterality: Right;   VESICOVAGINAL FISTULA CLOSURE W/ TAH  12/12/1967    Current Medications: Current Meds  Medication Sig   aspirin 81 MG tablet Take 1 tablet (81 mg total) by mouth daily.   Camphor-Menthol-Methyl Sal (SALONPAS) 3.12-16-08 % PTCH Place 1 patch onto the skin daily as needed (knee pain).   cetirizine (ZYRTEC) 10 MG tablet Take 10 mg by mouth daily.   furosemide (LASIX) 20 MG tablet TAKE 1/2 TABLET BY MOUTH AS NEEDED   gabapentin (NEURONTIN) 300 MG capsule Take 600 mg by mouth at bedtime.    hydrALAZINE (APRESOLINE) 25 MG tablet Take 1 tablet (25 mg total) by mouth daily as needed (blood pressure of 160 or above).   hydrochlorothiazide (HYDRODIURIL) 25 MG tablet TAKE 1 TABLET BY  MOUTH EVERY DAY   isosorbide mononitrate (IMDUR) 30 MG 24 hr tablet TAKE 1 TABLET BY MOUTH EVERY DAY   losartan (COZAAR) 50 MG tablet Take 1 tablet (50 mg total) by mouth 2 (two) times daily.   nitroGLYCERIN (NITROSTAT) 0.4 MG SL tablet PLACE 1 TABLET UNDER THE TONGUE EVERY 5 MINUTES AS NEEDED FOR CHEST PAIN.   ondansetron (ZOFRAN) 4 MG tablet Take 4 mg by mouth every 8 (eight) hours as needed for vomiting or nausea.   ONETOUCH ULTRA test strip CHECK ONCE DAILY AS DIRECTED   Polyethyl Glycol-Propyl Glycol (SYSTANE OP) Place 1 drop into both eyes in the morning and at bedtime.   saccharomyces boulardii (FLORASTOR) 250 MG capsule Take 250 mg by mouth daily.   zolpidem (AMBIEN) 5 MG tablet Take 7.5 mg by mouth at bedtime.     Allergies:   Amlodipine, Benadryl [diphenhydramine hcl (sleep)],  Ciprofloxacin, Lactose intolerance (gi), Macrobid [nitrofurantoin monohyd macro], Naproxen, Quinine sulfate [quinine], Rosuvastatin, Sulfa antibiotics, Tramadol, Penicillins, and Prednisone   Social History   Socioeconomic History   Marital status: Widowed    Spouse name: Not on file   Number of children: Not on file   Years of education: Not on file   Highest education level: Not on file  Occupational History   Not on file  Tobacco Use   Smoking status: Former    Packs/day: 1.00    Types: Cigarettes   Smokeless tobacco: Never  Vaping Use   Vaping Use: Never used  Substance and Sexual Activity   Alcohol use: No   Drug use: No   Sexual activity: Not on file  Other Topics Concern   Not on file  Social History Narrative   Not on file   Social Determinants of Health   Financial Resource Strain: Not on file  Food Insecurity: Not on file  Transportation Needs: Not on file  Physical Activity: Not on file  Stress: Not on file  Social Connections: Not on file     Family History: The patient's family history includes Cancer in her mother; Heart disease in her father.  ROS:   Please see the history of present illness.     All other systems reviewed and are negative.  EKGs/Labs/Other Studies Reviewed:    The following studies were reviewed today: MYOCARDIAL PERFUSION 04/21:  Nuclear stress EF: 79%. No wall motion abnormalities detected There was no ST segment deviation noted during stress. This is a low risk study. There is no ischemia identified The study is normal.  ECHO 09/22:  IMPRESSIONS    1. Left ventricular ejection fraction by 3D volume is 70 %. The left  ventricle has hyperdynamic function. The left ventricle has no regional  wall motion abnormalities. Left ventricular diastolic parameters are  consistent with Grade I diastolic  dysfunction (impaired relaxation). Elevated left atrial pressure. The  average left ventricular global longitudinal strain is 23.7  %. The global  longitudinal strain is normal.   2. Right ventricular systolic function is normal. The right ventricular  size is normal. There is normal pulmonary artery systolic pressure.   3. Left atrial size was mild to moderately dilated.   4. The mitral valve is degenerative. Mild mitral valve regurgitation. No  evidence of mitral stenosis. Severe mitral annular calcification.   5. The aortic valve is tricuspid. There is severe calcification of the  aortic valve. There is severe thickening of the aortic valve. Aortic valve  regurgitation is not visualized. Moderate to severe aortic valve  stenosis.   Comparison(s): Prior images reviewed side by side. Aortic stenosis is now  moderate to severe.     EKG:  EKG is  ordered today.  The ekg ordered today demonstrates normal sinus rhythm 69 poor R wave progression left axis deviation prior inferior infarct pattern.  Unchanged.  Recent Labs: 05/18/2022: BUN 21; Creatinine, Ser 0.83; Potassium 3.6; Sodium 133 05/19/2022: Hemoglobin 10.5; Platelets 220  Recent Lipid Panel No results found for: "CHOL", "TRIG", "HDL", "CHOLHDL", "VLDL", "LDLCALC", "LDLDIRECT"   Risk Assessment/Calculations:              Physical Exam:    VS:  BP (!) 121/50   Pulse 77   Ht '5\' 3"'$  (1.6 m)   Wt 130 lb (59 kg)   LMP  (LMP Unknown)   SpO2 97%   BMI 23.03 kg/m     Wt Readings from Last 3 Encounters:  07/07/22 130 lb (59 kg)  05/17/22 132 lb 4.4 oz (60 kg)  05/12/22 133 lb (60.3 kg)     GEN:  Well nourished, well developed in no acute distress, utilizes walker HEENT: Normal NECK: No JVD; No carotid bruits LYMPHATICS: No lymphadenopathy CARDIAC: RRR, 3/6 systolic murmur, no rubs, gallops RESPIRATORY:  Clear to auscultation without rales, wheezing or rhonchi  ABDOMEN: Soft, non-tender, non-distended MUSCULOSKELETAL:  No edema; No deformity  SKIN: Warm and dry NEUROLOGIC:  Alert and oriented x 3 PSYCHIATRIC:  Normal affect   ASSESSMENT:    1.  Murmur, cardiac   2. Coronary artery disease due to lipid rich plaque   3. Chest pain, unspecified type     PLAN:    In order of problems listed above:  Chest pain Shortness of breath periodically Moderate to severe aortic stenosis -Moderate to severe aortic stenosis seen on echocardiogram 08/2021.  We will go ahead and repeat echo.  She did take nitroglycerin which seemed to help her with her left axillary type discomfort.  Echocardiogram will be helpful to make sure that there are no significant wall motion abnormalities.  Prior EKG reviewed, unremarkable -She tolerated her knee surgery well with spinal anesthesia.  She does have a hip issue -Prior Myoview in 2021 was low risk. -Remote history of CAD see above       Medication Adjustments/Labs and Tests Ordered: Current medicines are reviewed at length with the patient today.  Concerns regarding medicines are outlined above.  Orders Placed This Encounter  Procedures   ECHOCARDIOGRAM COMPLETE   No orders of the defined types were placed in this encounter.   Patient Instructions  Medication Instructions:  The current medical regimen is effective;  continue present plan and medications.  *If you need a refill on your cardiac medications before your next appointment, please call your pharmacy*  Testing/Procedures: Your physician has requested that you have an echocardiogram. Echocardiography is a painless test that uses sound waves to create images of your heart. It provides your doctor with information about the size and shape of your heart and how well your heart's chambers and valves are working. This procedure takes approximately one hour. There are no restrictions for this procedure.    Follow-Up: At [facility], you and your health needs are our priority.  As part of our continuing mission to provide you with exceptional heart care, we have created designated Provider Care Teams.  These Care Teams include your primary  Cardiologist (physician) and Advanced Practice Providers (APPs -  Physician Assistants and Nurse Practitioners) who all work together to  provide you with the care you need, when you need it.  We recommend signing up for the patient portal called "MyChart".  Sign up information is provided on this After Visit Summary.  MyChart is used to connect with patients for Virtual Visits (Telemedicine).  Patients are able to view lab/test results, encounter notes, upcoming appointments, etc.  Non-urgent messages can be sent to your provider as well.   To learn more about what you can do with MyChart, go to [email or web address].    Your next appointment:   Follow up as scheduled with Dr Marlou Porch   Important Information About Sugar         Signed, Candee Furbish, MD  07/07/2022 3:16 PM    [place]

## 2022-07-10 DIAGNOSIS — M545 Low back pain, unspecified: Secondary | ICD-10-CM | POA: Diagnosis not present

## 2022-07-10 DIAGNOSIS — I10 Essential (primary) hypertension: Secondary | ICD-10-CM | POA: Diagnosis not present

## 2022-07-10 DIAGNOSIS — I2510 Atherosclerotic heart disease of native coronary artery without angina pectoris: Secondary | ICD-10-CM | POA: Diagnosis not present

## 2022-07-10 DIAGNOSIS — Z87891 Personal history of nicotine dependence: Secondary | ICD-10-CM | POA: Diagnosis not present

## 2022-07-10 DIAGNOSIS — T84022D Instability of internal right knee prosthesis, subsequent encounter: Secondary | ICD-10-CM | POA: Diagnosis not present

## 2022-07-10 DIAGNOSIS — M1990 Unspecified osteoarthritis, unspecified site: Secondary | ICD-10-CM | POA: Diagnosis not present

## 2022-07-19 DIAGNOSIS — M25551 Pain in right hip: Secondary | ICD-10-CM | POA: Diagnosis not present

## 2022-07-21 DIAGNOSIS — I2510 Atherosclerotic heart disease of native coronary artery without angina pectoris: Secondary | ICD-10-CM | POA: Diagnosis not present

## 2022-07-21 DIAGNOSIS — T84022D Instability of internal right knee prosthesis, subsequent encounter: Secondary | ICD-10-CM | POA: Diagnosis not present

## 2022-07-21 DIAGNOSIS — Z87891 Personal history of nicotine dependence: Secondary | ICD-10-CM | POA: Diagnosis not present

## 2022-07-21 DIAGNOSIS — M1990 Unspecified osteoarthritis, unspecified site: Secondary | ICD-10-CM | POA: Diagnosis not present

## 2022-07-21 DIAGNOSIS — M545 Low back pain, unspecified: Secondary | ICD-10-CM | POA: Diagnosis not present

## 2022-07-21 DIAGNOSIS — I10 Essential (primary) hypertension: Secondary | ICD-10-CM | POA: Diagnosis not present

## 2022-07-25 ENCOUNTER — Ambulatory Visit (HOSPITAL_COMMUNITY): Payer: Medicare Other | Attending: Cardiovascular Disease

## 2022-07-25 DIAGNOSIS — R011 Cardiac murmur, unspecified: Secondary | ICD-10-CM | POA: Diagnosis not present

## 2022-07-25 LAB — ECHOCARDIOGRAM COMPLETE
AR max vel: 0.8 cm2
AV Area VTI: 0.74 cm2
AV Area mean vel: 0.82 cm2
AV Mean grad: 29 mmHg
AV Peak grad: 51.3 mmHg
Ao pk vel: 3.58 m/s
Area-P 1/2: 2.16 cm2
S' Lateral: 2.5 cm

## 2022-07-27 ENCOUNTER — Other Ambulatory Visit: Payer: Self-pay | Admitting: Cardiology

## 2022-07-27 ENCOUNTER — Encounter: Payer: Self-pay | Admitting: Physician Assistant

## 2022-07-27 DIAGNOSIS — R197 Diarrhea, unspecified: Secondary | ICD-10-CM | POA: Insufficient documentation

## 2022-07-27 DIAGNOSIS — F419 Anxiety disorder, unspecified: Secondary | ICD-10-CM | POA: Insufficient documentation

## 2022-07-27 DIAGNOSIS — E1121 Type 2 diabetes mellitus with diabetic nephropathy: Secondary | ICD-10-CM | POA: Insufficient documentation

## 2022-07-27 DIAGNOSIS — N1831 Chronic kidney disease, stage 3a: Secondary | ICD-10-CM | POA: Insufficient documentation

## 2022-07-27 DIAGNOSIS — E7800 Pure hypercholesterolemia, unspecified: Secondary | ICD-10-CM | POA: Insufficient documentation

## 2022-07-27 DIAGNOSIS — R269 Unspecified abnormalities of gait and mobility: Secondary | ICD-10-CM | POA: Insufficient documentation

## 2022-07-27 DIAGNOSIS — K219 Gastro-esophageal reflux disease without esophagitis: Secondary | ICD-10-CM | POA: Insufficient documentation

## 2022-07-27 DIAGNOSIS — G4700 Insomnia, unspecified: Secondary | ICD-10-CM | POA: Insufficient documentation

## 2022-07-27 DIAGNOSIS — I359 Nonrheumatic aortic valve disorder, unspecified: Secondary | ICD-10-CM | POA: Insufficient documentation

## 2022-07-27 DIAGNOSIS — I10 Essential (primary) hypertension: Secondary | ICD-10-CM | POA: Insufficient documentation

## 2022-07-27 DIAGNOSIS — K5792 Diverticulitis of intestine, part unspecified, without perforation or abscess without bleeding: Secondary | ICD-10-CM | POA: Insufficient documentation

## 2022-07-27 DIAGNOSIS — R1310 Dysphagia, unspecified: Secondary | ICD-10-CM | POA: Insufficient documentation

## 2022-08-03 DIAGNOSIS — M1611 Unilateral primary osteoarthritis, right hip: Secondary | ICD-10-CM | POA: Diagnosis not present

## 2022-08-03 DIAGNOSIS — M25551 Pain in right hip: Secondary | ICD-10-CM | POA: Diagnosis not present

## 2022-08-04 ENCOUNTER — Ambulatory Visit (INDEPENDENT_AMBULATORY_CARE_PROVIDER_SITE_OTHER): Payer: Medicare Other | Admitting: Cardiovascular Disease

## 2022-08-04 ENCOUNTER — Encounter: Payer: Self-pay | Admitting: Cardiovascular Disease

## 2022-08-04 VITALS — BP 114/60 | HR 69 | Ht 63.0 in | Wt 130.2 lb

## 2022-08-04 DIAGNOSIS — I2510 Atherosclerotic heart disease of native coronary artery without angina pectoris: Secondary | ICD-10-CM

## 2022-08-04 DIAGNOSIS — I359 Nonrheumatic aortic valve disorder, unspecified: Secondary | ICD-10-CM | POA: Diagnosis not present

## 2022-08-04 DIAGNOSIS — Z01812 Encounter for preprocedural laboratory examination: Secondary | ICD-10-CM

## 2022-08-04 DIAGNOSIS — I10 Essential (primary) hypertension: Secondary | ICD-10-CM | POA: Diagnosis not present

## 2022-08-04 DIAGNOSIS — I350 Nonrheumatic aortic (valve) stenosis: Secondary | ICD-10-CM

## 2022-08-04 DIAGNOSIS — I2583 Coronary atherosclerosis due to lipid rich plaque: Secondary | ICD-10-CM

## 2022-08-04 NOTE — Progress Notes (Signed)
[facility] Consult Note  Chief Complaint  Patient presents with   New Patient (Initial Visit)    Severe aortic stenosis    History of Present Illness:88 yo female with history of anxiety, breast cancer s/p mastectomy, CKD, borderline diabetes, HTN, hyperlipidemia and severe aortic stenosis who is here today as a new consult, referred by Dr. Marlou Porch, for further discussion regarding her aortic stenosis and possible TAVR. She has been followed for moderate aortic stenosis by Dr. Marlou Porch. Recent worsened dyspnea and chest pain. Echo 07/25/22 with LVEF=60-65%. Moderate LVH. Mild MR. The aortic valve is thickened and calcified with mean gradient 29 mmHg, peak gradient 51.3 mmHg, AVA 0.74 cm2, DI 0.24. This is consistent with paradoxical low flow/low gradient aortic stenosis.   She tells me today that she has progressive dyspnea and fatigue. She is limited by hip pain. She wants to have her valve fixed so she can have her hip surgery. Rare chest pains. She has some LE edema. She lives in a [place] facility. She is a retired Pharmacist, hospital.  She has no dental issues.   Primary Care Physician: Seward Carol, MD Primary Cardiologist: Marlou Porch Referring Cardiologist: Marlou Porch  Past Medical History:  Diagnosis Date   Anxiety    Breast cancer [facility])    Hx of BC, s/p mastectomy on right 1999, s/p tamoxifen X1 year   Chest pain    EC Stress test 12/21/11 Low risk, no ischemia, normal EF   Chronic kidney disease    Coronary artery disease    Remote Hx of CAD, normal stress test 2009   Diabetes mellitus without complication ([place])    Diverticulitis    GERD (gastroesophageal reflux disease)    No longer a problem   Hypercholesteremia    Hypertension    Insomnia    Lichen sclerosus    Low back pain    Myocardial infarction ([place]) 12/11/1998   Osteoarthrosis, unspecified whether generalized or localized, unspecified site    Pain    Several year Hx of pain/ SOB radiating to her neck   Severe  aortic stenosis    UTI (urinary tract infection)     Past Surgical History:  Procedure Laterality Date   ABDOMINAL HYSTERECTOMY     APPENDECTOMY     HAND SURGERY Bilateral    CTS and trigger finger on right and left   MASTECTOMY Right 12/11/1997   REPLACEMENT TOTAL KNEE Right 11/10/2009   TOTAL KNEE REVISION Right 05/17/2022   Procedure: Right knee polyethylene revision;  Surgeon: Gaynelle Arabian, MD;  Location: [facility];  Service: Orthopedics;  Laterality: Right;   VESICOVAGINAL FISTULA CLOSURE W/ TAH  12/12/1967    Current Outpatient Medications  Medication Sig Dispense Refill   aspirin 81 MG tablet Take 1 tablet (81 mg total) by mouth daily. 30 tablet 0   Camphor-Menthol-Methyl Sal (SALONPAS) 3.12-16-08 % PTCH Place 1 patch onto the skin daily as needed (knee pain).     cetirizine (ZYRTEC) 10 MG tablet Take 10 mg by mouth daily.     furosemide (LASIX) 20 MG tablet TAKE 1/2 TABLET BY MOUTH AS NEEDED 45 tablet 2   gabapentin (NEURONTIN) 300 MG capsule Take 600 mg by mouth at bedtime.      hydrALAZINE (APRESOLINE) 25 MG tablet Take 1 tablet (25 mg total) by mouth daily as needed (blood pressure of 160 or above). 90 tablet 3   hydrochlorothiazide (HYDRODIURIL) 25 MG tablet TAKE 1 TABLET BY MOUTH EVERY DAY 90 tablet 2   isosorbide  mononitrate (IMDUR) 30 MG 24 hr tablet TAKE 1 TABLET BY MOUTH EVERY DAY 90 tablet 2   losartan (COZAAR) 50 MG tablet Take 1 tablet (50 mg total) by mouth 2 (two) times daily. 180 tablet 3   nitroGLYCERIN (NITROSTAT) 0.4 MG SL tablet PLACE 1 TABLET UNDER THE TONGUE EVERY 5 MINUTES AS NEEDED FOR CHEST PAIN. 25 tablet 1   ondansetron (ZOFRAN) 4 MG tablet Take 4 mg by mouth every 8 (eight) hours as needed for vomiting or nausea.     ONETOUCH ULTRA test strip CHECK ONCE DAILY AS DIRECTED     Polyethyl Glycol-Propyl Glycol (SYSTANE OP) Place 1 drop into both eyes in the morning and at bedtime.     saccharomyces boulardii (FLORASTOR) 250 MG capsule Take 250 mg by mouth  daily.     zolpidem (AMBIEN) 5 MG tablet Take 7.5 mg by mouth at bedtime.     No current facility-administered medications for this visit.    Allergies  Allergen Reactions   Amlodipine Swelling    LEE with '5mg'$  dose  Foot swelling   Benadryl [Diphenhydramine Hcl (Sleep)] Other (See Comments)    Hyperstimulation   Ciprofloxacin Hives   Lactose Intolerance (Gi) Diarrhea   Macrobid [Nitrofurantoin Monohyd Macro] Nausea And Vomiting   Naproxen Other (See Comments)    Stomach pain   Quinine Sulfate [Quinine] Hives   Rosuvastatin     headache   Sulfa Antibiotics Hives        Tramadol Other (See Comments)    unknown to patient   Penicillins Rash    Has patient had a PCN reaction causing immediate rash, facial/tongue/throat swelling, SOB or lightheadedness with hypotension: Yes Has patient had a PCN reaction causing severe rash involving mucus membranes or skin necrosis: No Has patient had a PCN reaction that required hospitalization: No Has patient had a PCN reaction occurring within the last 10 years: No If all of the above answers are "NO", then may proceed with Cephalosporin use. Tolerated Cephalosporin Date: 05/19/22.     Prednisone Other (See Comments)    Weakness and pain in joints    Social History   Socioeconomic History   Marital status: Widowed    Spouse name: Not on file   Number of children: 3   Years of education: Not on file   Highest education level: Not on file  Occupational History   Occupation: Retired Pharmacist, hospital  Tobacco Use   Smoking status: Former    Packs/day: 1.00    Types: Cigarettes    Quit date: 07/08/1997    Years since quitting: 25.0   Smokeless tobacco: Never  Vaping Use   Vaping Use: Never used  Substance and Sexual Activity   Alcohol use: No   Drug use: No   Sexual activity: Not on file  Other Topics Concern   Not on file  Social History Narrative   Not on file   Social Determinants of Health   Financial Resource Strain: Not on  file  Food Insecurity: Not on file  Transportation Needs: Not on file  Physical Activity: Not on file  Stress: Not on file  Social Connections: Not on file  Intimate Partner Violence: Not on file    Family History  Problem Relation Age of Onset   Cancer Mother        cervical   Heart disease Father     Review of Systems:  As stated in the HPI and otherwise negative.   BP 114/60   Pulse  69   Ht '5\' 3"'$  (1.6 m)   Wt 130 lb 3.2 oz (59.1 kg)   LMP  (LMP Unknown)   SpO2 98%   BMI 23.06 kg/m   Physical Examination: General: Well developed, well nourished, NAD  HEENT: OP clear, mucus membranes moist  SKIN: warm, dry. No rashes. Neuro: No focal deficits  Musculoskeletal: Muscle strength 5/5 all ext  Psychiatric: Mood and affect normal  Neck: No JVD, no carotid bruits, no thyromegaly, no lymphadenopathy.  Lungs:Clear bilaterally, no wheezes, rhonci, crackles Cardiovascular: Regular rate and rhythm. Loud, harsh, late peaking systolic murmur.  Abdomen:Soft. Bowel sounds present. Non-tender.  Extremities: trace bilateral lower extremity edema. Pulses are 2 + in the bilateral DP/PT.  EKG:  EKG is ordered today. The ekg ordered today demonstrates NSR, possible inferior Q waves, poor R wave progression precordial leads  Echo 07/25/22:  1. Left ventricular ejection fraction, by estimation, is 60 to 65%. The  left ventricle has normal function. The left ventricle has no regional  wall motion abnormalities. There is moderate left ventricular hypertrophy.  Left ventricular diastolic  parameters are consistent with Grade I diastolic dysfunction (impaired  relaxation). Elevated left ventricular end-diastolic pressure.   2. Right ventricular systolic function is normal. The right ventricular  size is normal.   3. Left atrial size was mildly dilated.   4. The mitral valve is degenerative. Mild mitral valve regurgitation. No  evidence of mitral stenosis. Severe mitral annular  calcification.   5. Gradients have increased since 08/25/21 mean 23-> 29 peak 43.8 -> 51.3  with DVInow 0.24 and AVA 0.8 cm2 Consistent with severe low gradient AS .  The aortic valve is tricuspid. There is severe calcifcation of the aortic  valve. There is severe  thickening of the aortic valve. Aortic valve regurgitation is not  visualized. Severe aortic valve stenosis.   6. The inferior vena cava is normal in size with greater than 50%  respiratory variability, suggesting right atrial pressure of 3 mmHg.   FINDINGS   Left Ventricle: Left ventricular ejection fraction, by estimation, is 60  to 65%. The left ventricle has normal function. The left ventricle has no  regional wall motion abnormalities. The left ventricular internal cavity  size was normal in size. There is   moderate left ventricular hypertrophy. Left ventricular diastolic  parameters are consistent with Grade I diastolic dysfunction (impaired  relaxation). Elevated left ventricular end-diastolic pressure.   Right Ventricle: The right ventricular size is normal. No increase in  right ventricular wall thickness. Right ventricular systolic function is  normal.   Left Atrium: Left atrial size was mildly dilated.   Right Atrium: Right atrial size was normal in size.   Pericardium: There is no evidence of pericardial effusion.   Mitral Valve: The mitral valve is degenerative in appearance. There is  mild thickening of the mitral valve leaflet(s). There is mild  calcification of the mitral valve leaflet(s). Severe mitral annular  calcification. Mild mitral valve regurgitation. No  evidence of mitral valve stenosis.   Tricuspid Valve: The tricuspid valve is normal in structure. Tricuspid  valve regurgitation is not demonstrated. No evidence of tricuspid  stenosis.   Aortic Valve: Gradients have increased since 08/25/21 mean 23-> 29 peak  43.8 -> 51.3 with DVInow 0.24 and AVA 0.8 cm2 Consistent with severe low  gradient  AS. The aortic valve is tricuspid. There is severe calcifcation  of the aortic valve. There is severe  thickening of the aortic valve. Aortic valve regurgitation  is not  visualized. Severe aortic stenosis is present. Aortic valve mean gradient  measures 29.0 mmHg. Aortic valve peak gradient measures 51.3 mmHg. Aortic  valve area, by VTI measures 0.74 cm.   Pulmonic Valve: The pulmonic valve was normal in structure. Pulmonic valve  regurgitation is mild. No evidence of pulmonic stenosis.   Aorta: The aortic root is normal in size and structure.   Venous: The inferior vena cava is normal in size with greater than 50%  respiratory variability, suggesting right atrial pressure of 3 mmHg.   IAS/Shunts: No atrial level shunt detected by color flow Doppler.      LEFT VENTRICLE  PLAX 2D  LVIDd:         3.90 cm   Diastology  LVIDs:         2.50 cm   LV e' medial:    5.33 cm/s  LV PW:         1.40 cm   LV E/e' medial:  20.6  LV IVS:        1.50 cm   LV e' lateral:   6.31 cm/s  LVOT diam:     2.00 cm   LV E/e' lateral: 17.4  LV SV:         63  LV SV Index:   39  LVOT Area:     3.14 cm      RIGHT VENTRICLE             IVC  RV S prime:     13.80 cm/s  IVC diam: 1.30 cm  TAPSE (M-mode): 1.8 cm  RVSP:           22.0 mmHg   LEFT ATRIUM             Index        RIGHT ATRIUM           Index  LA diam:        4.10 cm 2.55 cm/m   RA Pressure: 3.00 mmHg  LA Vol (A2C):   59.0 ml 36.64 ml/m  RA Area:     15.40 cm  LA Vol (A4C):   45.1 ml 28.01 ml/m  RA Volume:   38.80 ml  24.10 ml/m  LA Biplane Vol: 54.4 ml 33.79 ml/m   AORTIC VALVE  AV Area (Vmax):    0.80 cm  AV Area (Vmean):   0.82 cm  AV Area (VTI):     0.74 cm  AV Vmax:           358.00 cm/s  AV Vmean:          247.000 cm/s  AV VTI:            0.846 m  AV Peak Grad:      51.3 mmHg  AV Mean Grad:      29.0 mmHg  LVOT Vmax:         91.00 cm/s  LVOT Vmean:        64.200 cm/s  LVOT VTI:          0.200 m  LVOT/AV VTI ratio:  0.24     AORTA  Ao Root diam: 3.20 cm  Ao Asc diam:  3.20 cm   MITRAL VALVE                TRICUSPID VALVE  MV Area (PHT): 2.16 cm     TR Peak grad:   19.0 mmHg  MV Decel Time: 352 msec  TR Vmax:        218.00 cm/s  MV E velocity: 110.00 cm/s  Estimated RAP:  3.00 mmHg  MV A velocity: 159.00 cm/s  RVSP:           22.0 mmHg  MV E/A ratio:  0.69                              SHUNTS                              Systemic VTI:  0.20 m                              Systemic Diam: 2.00 cm   Recent Labs: 05/18/2022: BUN 21; Creatinine, Ser 0.83; Potassium 3.6; Sodium 133 05/19/2022: Hemoglobin 10.5; Platelets 220    Wt Readings from Last 3 Encounters:  08/04/22 130 lb 3.2 oz (59.1 kg)  07/07/22 130 lb (59 kg)  05/17/22 132 lb 4.4 oz (60 kg)     Assessment and Plan:   1. Severe Aortic Valve Stenosis: She has severe  stage D3 (paradoxical low flow/low gradient)  aortic valve stenosis. NYHA class II. I have personally reviewed the echo images. The aortic valve is thickened, calcified with limited leaflet mobility. I think she would benefit from AVR. Given advanced age, she is not a good candidate for conventional AVR by surgical approach. I think she may be a good candidate for TAVR.   I have reviewed the natural history of aortic stenosis with the patient and their family members  who are present today. We have discussed the limitations of medical therapy and the poor prognosis associated with symptomatic aortic stenosis. We have reviewed potential treatment options, including palliative medical therapy, conventional surgical aortic valve replacement, and transcatheter aortic valve replacement. We discussed treatment options in the context of the patient's specific comorbid medical conditions.    She would like to proceed with planning for TAVR. I will arrange a right and left heart catheterization at [facility] 08/17/22 at 10:30am. Risks and benefits of the cath procedure and the valve procedure are  reviewed with the patient. After the cath, she will have a cardiac CT, CTA of the chest/abdomen and pelvis and will then be referred to see one of the CT surgeons on our TAVR team.      Labs/ tests ordered today include:   Orders Placed This Encounter  Procedures   CBC   Basic metabolic panel   EKG [id number]     Disposition:   F/U with the valve team.    Signed, Lauree Chandler, MD 08/04/2022 1:23 PM    [address], [place], [place]  [address] Phone: [phone]; Fax: [phone]

## 2022-08-04 NOTE — Patient Instructions (Signed)
Medication Instructions:  No changes *If you need a refill on your cardiac medications before your next appointment, please call your pharmacy*   Lab Work: Today: bmet, cbc  Testing/Procedures: Your physician has requested that you have a cardiac catheterization. Cardiac catheterization is used to diagnose and/or treat various heart conditions. Doctors may recommend this procedure for a number of different reasons. The most common reason is to evaluate chest pain. Chest pain can be a symptom of coronary artery disease (CAD), and cardiac catheterization can show whether plaque is narrowing or blocking your heart's arteries. This procedure is also used to evaluate the valves, as well as measure the blood flow and oxygen levels in different parts of your heart. For further information please visit [email or web address]. Please follow instruction sheet, as given.   Follow-Up: Per Structural Heart Valve Team   Other Instructions  [facility] OFFICE [place], [place] [address] Dept: [phone] Loc: [place]  08/04/2022  You are scheduled for a Cardiac Catheterization on Thursday, September 7 with Dr. Lauree Chandler.  1. Please arrive at the Main Entrance A at [facility]: [place], [place] [address] at 8:30 AM (This time is two hours before your procedure to ensure your preparation). Free valet parking service is available.   Special note: Every effort is made to have your procedure done on time. Please understand that emergencies sometimes delay scheduled procedures.  2. Diet: Do not eat solid foods after midnight.  You may have clear liquids until 5 AM upon the day of the procedure.  3. Labs: You will need to have blood drawn today.You do not need to be fasting.  4. Medication instructions in preparation for your procedure:  Contrast  Allergy: No   On the morning of your procedure, take Aspirin and any morning medicines NOT listed above.  You may use sips of water.  5. Plan to go home the same day, you will only stay overnight if medically necessary. 6. You MUST have a responsible adult to drive you home. 7. An adult MUST be with you the first 24 hours after you arrive home. 8. Bring a current list of your medications, and the last time and date medication taken. 9. Bring ID and current insurance cards. 10.Please wear clothes that are easy to get on and off and wear slip-on shoes.  Thank you for allowing [place] to care for you!   -- [facility]

## 2022-08-04 NOTE — H&P (View-Only) (Signed)
[facility] Consult Note  Chief Complaint  Patient presents with   New Patient (Initial Visit)    Severe aortic stenosis    History of Present Illness:88 yo female with history of anxiety, breast cancer s/p mastectomy, CKD, borderline diabetes, HTN, hyperlipidemia and severe aortic stenosis who is here today as a new consult, referred by Dr. Marlou Porch, for further discussion regarding her aortic stenosis and possible TAVR. She has been followed for moderate aortic stenosis by Dr. Marlou Porch. Recent worsened dyspnea and chest pain. Echo 07/25/22 with LVEF=60-65%. Moderate LVH. Mild MR. The aortic valve is thickened and calcified with mean gradient 29 mmHg, peak gradient 51.3 mmHg, AVA 0.74 cm2, DI 0.24. This is consistent with paradoxical low flow/low gradient aortic stenosis.   She tells me today that she has progressive dyspnea and fatigue. She is limited by hip pain. She wants to have her valve fixed so she can have her hip surgery. Rare chest pains. She has some LE edema. She lives in a [place] facility. She is a retired Pharmacist, hospital.  She has no dental issues.   Primary Care Physician: Seward Carol, MD Primary Cardiologist: Marlou Porch Referring Cardiologist: Marlou Porch  Past Medical History:  Diagnosis Date   Anxiety    Breast cancer [facility])    Hx of BC, s/p mastectomy on right 1999, s/p tamoxifen X1 year   Chest pain    EC Stress test 12/21/11 Low risk, no ischemia, normal EF   Chronic kidney disease    Coronary artery disease    Remote Hx of CAD, normal stress test 2009   Diabetes mellitus without complication ([place])    Diverticulitis    GERD (gastroesophageal reflux disease)    No longer a problem   Hypercholesteremia    Hypertension    Insomnia    Lichen sclerosus    Low back pain    Myocardial infarction ([place]) 12/11/1998   Osteoarthrosis, unspecified whether generalized or localized, unspecified site    Pain    Several year Hx of pain/ SOB radiating to her neck   Severe  aortic stenosis    UTI (urinary tract infection)     Past Surgical History:  Procedure Laterality Date   ABDOMINAL HYSTERECTOMY     APPENDECTOMY     HAND SURGERY Bilateral    CTS and trigger finger on right and left   MASTECTOMY Right 12/11/1997   REPLACEMENT TOTAL KNEE Right 11/10/2009   TOTAL KNEE REVISION Right 05/17/2022   Procedure: Right knee polyethylene revision;  Surgeon: Gaynelle Arabian, MD;  Location: [facility];  Service: Orthopedics;  Laterality: Right;   VESICOVAGINAL FISTULA CLOSURE W/ TAH  12/12/1967    Current Outpatient Medications  Medication Sig Dispense Refill   aspirin 81 MG tablet Take 1 tablet (81 mg total) by mouth daily. 30 tablet 0   Camphor-Menthol-Methyl Sal (SALONPAS) 3.12-16-08 % PTCH Place 1 patch onto the skin daily as needed (knee pain).     cetirizine (ZYRTEC) 10 MG tablet Take 10 mg by mouth daily.     furosemide (LASIX) 20 MG tablet TAKE 1/2 TABLET BY MOUTH AS NEEDED 45 tablet 2   gabapentin (NEURONTIN) 300 MG capsule Take 600 mg by mouth at bedtime.      hydrALAZINE (APRESOLINE) 25 MG tablet Take 1 tablet (25 mg total) by mouth daily as needed (blood pressure of 160 or above). 90 tablet 3   hydrochlorothiazide (HYDRODIURIL) 25 MG tablet TAKE 1 TABLET BY MOUTH EVERY DAY 90 tablet 2   isosorbide  mononitrate (IMDUR) 30 MG 24 hr tablet TAKE 1 TABLET BY MOUTH EVERY DAY 90 tablet 2   losartan (COZAAR) 50 MG tablet Take 1 tablet (50 mg total) by mouth 2 (two) times daily. 180 tablet 3   nitroGLYCERIN (NITROSTAT) 0.4 MG SL tablet PLACE 1 TABLET UNDER THE TONGUE EVERY 5 MINUTES AS NEEDED FOR CHEST PAIN. 25 tablet 1   ondansetron (ZOFRAN) 4 MG tablet Take 4 mg by mouth every 8 (eight) hours as needed for vomiting or nausea.     ONETOUCH ULTRA test strip CHECK ONCE DAILY AS DIRECTED     Polyethyl Glycol-Propyl Glycol (SYSTANE OP) Place 1 drop into both eyes in the morning and at bedtime.     saccharomyces boulardii (FLORASTOR) 250 MG capsule Take 250 mg by mouth  daily.     zolpidem (AMBIEN) 5 MG tablet Take 7.5 mg by mouth at bedtime.     No current facility-administered medications for this visit.    Allergies  Allergen Reactions   Amlodipine Swelling    LEE with '5mg'$  dose  Foot swelling   Benadryl [Diphenhydramine Hcl (Sleep)] Other (See Comments)    Hyperstimulation   Ciprofloxacin Hives   Lactose Intolerance (Gi) Diarrhea   Macrobid [Nitrofurantoin Monohyd Macro] Nausea And Vomiting   Naproxen Other (See Comments)    Stomach pain   Quinine Sulfate [Quinine] Hives   Rosuvastatin     headache   Sulfa Antibiotics Hives        Tramadol Other (See Comments)    unknown to patient   Penicillins Rash    Has patient had a PCN reaction causing immediate rash, facial/tongue/throat swelling, SOB or lightheadedness with hypotension: Yes Has patient had a PCN reaction causing severe rash involving mucus membranes or skin necrosis: No Has patient had a PCN reaction that required hospitalization: No Has patient had a PCN reaction occurring within the last 10 years: No If all of the above answers are "NO", then may proceed with Cephalosporin use. Tolerated Cephalosporin Date: 05/19/22.     Prednisone Other (See Comments)    Weakness and pain in joints    Social History   Socioeconomic History   Marital status: Widowed    Spouse name: Not on file   Number of children: 3   Years of education: Not on file   Highest education level: Not on file  Occupational History   Occupation: Retired Pharmacist, hospital  Tobacco Use   Smoking status: Former    Packs/day: 1.00    Types: Cigarettes    Quit date: 07/08/1997    Years since quitting: 25.0   Smokeless tobacco: Never  Vaping Use   Vaping Use: Never used  Substance and Sexual Activity   Alcohol use: No   Drug use: No   Sexual activity: Not on file  Other Topics Concern   Not on file  Social History Narrative   Not on file   Social Determinants of Health   Financial Resource Strain: Not on  file  Food Insecurity: Not on file  Transportation Needs: Not on file  Physical Activity: Not on file  Stress: Not on file  Social Connections: Not on file  Intimate Partner Violence: Not on file    Family History  Problem Relation Age of Onset   Cancer Mother        cervical   Heart disease Father     Review of Systems:  As stated in the HPI and otherwise negative.   BP 114/60   Pulse  69   Ht '5\' 3"'$  (1.6 m)   Wt 130 lb 3.2 oz (59.1 kg)   LMP  (LMP Unknown)   SpO2 98%   BMI 23.06 kg/m   Physical Examination: General: Well developed, well nourished, NAD  HEENT: OP clear, mucus membranes moist  SKIN: warm, dry. No rashes. Neuro: No focal deficits  Musculoskeletal: Muscle strength 5/5 all ext  Psychiatric: Mood and affect normal  Neck: No JVD, no carotid bruits, no thyromegaly, no lymphadenopathy.  Lungs:Clear bilaterally, no wheezes, rhonci, crackles Cardiovascular: Regular rate and rhythm. Loud, harsh, late peaking systolic murmur.  Abdomen:Soft. Bowel sounds present. Non-tender.  Extremities: trace bilateral lower extremity edema. Pulses are 2 + in the bilateral DP/PT.  EKG:  EKG is ordered today. The ekg ordered today demonstrates NSR, possible inferior Q waves, poor R wave progression precordial leads  Echo 07/25/22:  1. Left ventricular ejection fraction, by estimation, is 60 to 65%. The  left ventricle has normal function. The left ventricle has no regional  wall motion abnormalities. There is moderate left ventricular hypertrophy.  Left ventricular diastolic  parameters are consistent with Grade I diastolic dysfunction (impaired  relaxation). Elevated left ventricular end-diastolic pressure.   2. Right ventricular systolic function is normal. The right ventricular  size is normal.   3. Left atrial size was mildly dilated.   4. The mitral valve is degenerative. Mild mitral valve regurgitation. No  evidence of mitral stenosis. Severe mitral annular  calcification.   5. Gradients have increased since 08/25/21 mean 23-> 29 peak 43.8 -> 51.3  with DVInow 0.24 and AVA 0.8 cm2 Consistent with severe low gradient AS .  The aortic valve is tricuspid. There is severe calcifcation of the aortic  valve. There is severe  thickening of the aortic valve. Aortic valve regurgitation is not  visualized. Severe aortic valve stenosis.   6. The inferior vena cava is normal in size with greater than 50%  respiratory variability, suggesting right atrial pressure of 3 mmHg.   FINDINGS   Left Ventricle: Left ventricular ejection fraction, by estimation, is 60  to 65%. The left ventricle has normal function. The left ventricle has no  regional wall motion abnormalities. The left ventricular internal cavity  size was normal in size. There is   moderate left ventricular hypertrophy. Left ventricular diastolic  parameters are consistent with Grade I diastolic dysfunction (impaired  relaxation). Elevated left ventricular end-diastolic pressure.   Right Ventricle: The right ventricular size is normal. No increase in  right ventricular wall thickness. Right ventricular systolic function is  normal.   Left Atrium: Left atrial size was mildly dilated.   Right Atrium: Right atrial size was normal in size.   Pericardium: There is no evidence of pericardial effusion.   Mitral Valve: The mitral valve is degenerative in appearance. There is  mild thickening of the mitral valve leaflet(s). There is mild  calcification of the mitral valve leaflet(s). Severe mitral annular  calcification. Mild mitral valve regurgitation. No  evidence of mitral valve stenosis.   Tricuspid Valve: The tricuspid valve is normal in structure. Tricuspid  valve regurgitation is not demonstrated. No evidence of tricuspid  stenosis.   Aortic Valve: Gradients have increased since 08/25/21 mean 23-> 29 peak  43.8 -> 51.3 with DVInow 0.24 and AVA 0.8 cm2 Consistent with severe low  gradient  AS. The aortic valve is tricuspid. There is severe calcifcation  of the aortic valve. There is severe  thickening of the aortic valve. Aortic valve regurgitation  is not  visualized. Severe aortic stenosis is present. Aortic valve mean gradient  measures 29.0 mmHg. Aortic valve peak gradient measures 51.3 mmHg. Aortic  valve area, by VTI measures 0.74 cm.   Pulmonic Valve: The pulmonic valve was normal in structure. Pulmonic valve  regurgitation is mild. No evidence of pulmonic stenosis.   Aorta: The aortic root is normal in size and structure.   Venous: The inferior vena cava is normal in size with greater than 50%  respiratory variability, suggesting right atrial pressure of 3 mmHg.   IAS/Shunts: No atrial level shunt detected by color flow Doppler.      LEFT VENTRICLE  PLAX 2D  LVIDd:         3.90 cm   Diastology  LVIDs:         2.50 cm   LV e' medial:    5.33 cm/s  LV PW:         1.40 cm   LV E/e' medial:  20.6  LV IVS:        1.50 cm   LV e' lateral:   6.31 cm/s  LVOT diam:     2.00 cm   LV E/e' lateral: 17.4  LV SV:         63  LV SV Index:   39  LVOT Area:     3.14 cm      RIGHT VENTRICLE             IVC  RV S prime:     13.80 cm/s  IVC diam: 1.30 cm  TAPSE (M-mode): 1.8 cm  RVSP:           22.0 mmHg   LEFT ATRIUM             Index        RIGHT ATRIUM           Index  LA diam:        4.10 cm 2.55 cm/m   RA Pressure: 3.00 mmHg  LA Vol (A2C):   59.0 ml 36.64 ml/m  RA Area:     15.40 cm  LA Vol (A4C):   45.1 ml 28.01 ml/m  RA Volume:   38.80 ml  24.10 ml/m  LA Biplane Vol: 54.4 ml 33.79 ml/m   AORTIC VALVE  AV Area (Vmax):    0.80 cm  AV Area (Vmean):   0.82 cm  AV Area (VTI):     0.74 cm  AV Vmax:           358.00 cm/s  AV Vmean:          247.000 cm/s  AV VTI:            0.846 m  AV Peak Grad:      51.3 mmHg  AV Mean Grad:      29.0 mmHg  LVOT Vmax:         91.00 cm/s  LVOT Vmean:        64.200 cm/s  LVOT VTI:          0.200 m  LVOT/AV VTI ratio:  0.24     AORTA  Ao Root diam: 3.20 cm  Ao Asc diam:  3.20 cm   MITRAL VALVE                TRICUSPID VALVE  MV Area (PHT): 2.16 cm     TR Peak grad:   19.0 mmHg  MV Decel Time: 352 msec  TR Vmax:        218.00 cm/s  MV E velocity: 110.00 cm/s  Estimated RAP:  3.00 mmHg  MV A velocity: 159.00 cm/s  RVSP:           22.0 mmHg  MV E/A ratio:  0.69                              SHUNTS                              Systemic VTI:  0.20 m                              Systemic Diam: 2.00 cm   Recent Labs: 05/18/2022: BUN 21; Creatinine, Ser 0.83; Potassium 3.6; Sodium 133 05/19/2022: Hemoglobin 10.5; Platelets 220    Wt Readings from Last 3 Encounters:  08/04/22 130 lb 3.2 oz (59.1 kg)  07/07/22 130 lb (59 kg)  05/17/22 132 lb 4.4 oz (60 kg)     Assessment and Plan:   1. Severe Aortic Valve Stenosis: She has severe  stage D3 (paradoxical low flow/low gradient)  aortic valve stenosis. NYHA class II. I have personally reviewed the echo images. The aortic valve is thickened, calcified with limited leaflet mobility. I think she would benefit from AVR. Given advanced age, she is not a good candidate for conventional AVR by surgical approach. I think she may be a good candidate for TAVR.   I have reviewed the natural history of aortic stenosis with the patient and their family members  who are present today. We have discussed the limitations of medical therapy and the poor prognosis associated with symptomatic aortic stenosis. We have reviewed potential treatment options, including palliative medical therapy, conventional surgical aortic valve replacement, and transcatheter aortic valve replacement. We discussed treatment options in the context of the patient's specific comorbid medical conditions.    She would like to proceed with planning for TAVR. I will arrange a right and left heart catheterization at [facility] 08/17/22 at 10:30am. Risks and benefits of the cath procedure and the valve procedure are  reviewed with the patient. After the cath, she will have a cardiac CT, CTA of the chest/abdomen and pelvis and will then be referred to see one of the CT surgeons on our TAVR team.      Labs/ tests ordered today include:   Orders Placed This Encounter  Procedures   CBC   Basic metabolic panel   EKG 26-OMBT     Disposition:   F/U with the valve team.    Signed, Lauree Chandler, MD 08/04/2022 1:23 PM    [address], [place], [place]  [address] Phone: [phone]; Fax: [phone]

## 2022-08-05 LAB — CBC
Hematocrit: 35.6 % (ref 34.0–46.6)
Hemoglobin: 12.2 g/dL (ref 11.1–15.9)
MCH: 31.3 pg (ref 26.6–33.0)
MCHC: 34.3 g/dL (ref 31.5–35.7)
MCV: 91 fL (ref 79–97)
Platelets: 270 10*3/uL (ref 150–450)
RBC: 3.9 x10E6/uL (ref 3.77–5.28)
RDW: 14 % (ref 11.7–15.4)
WBC: 10.3 10*3/uL (ref 3.4–10.8)

## 2022-08-05 LAB — BASIC METABOLIC PANEL
BUN/Creatinine Ratio: 30 — ABNORMAL HIGH (ref 12–28)
BUN: 31 mg/dL — ABNORMAL HIGH (ref 8–27)
CO2: 26 mmol/L (ref 20–29)
Calcium: 9.8 mg/dL (ref 8.7–10.3)
Chloride: 89 mmol/L — ABNORMAL LOW (ref 96–106)
Creatinine, Ser: 1.04 mg/dL — ABNORMAL HIGH (ref 0.57–1.00)
Glucose: 111 mg/dL — ABNORMAL HIGH (ref 70–99)
Potassium: 3.5 mmol/L (ref 3.5–5.2)
Sodium: 132 mmol/L — ABNORMAL LOW (ref 134–144)
eGFR: 51 mL/min/{1.73_m2} — ABNORMAL LOW (ref >59)

## 2022-08-08 NOTE — Progress Notes (Addendum)
Pre Surgical Assessment: 5 M Walk Test  30M=16.60f  5 Meter Walk Test- trial 1: 8.53 seconds 5 Meter Walk Test- trial 2: 7.15 seconds 5 Meter Walk Test- trial 3: 7.81 seconds 5 Meter Walk Test Average: 7.83 seconds  Procedure Type: Isolated AVR Perioperative Outcome Estimate % Operative Mortality 5.8% Morbidity & Mortality 8.75% Stroke 2.31% Renal Failure 1.6% Reoperation 3.09% Prolonged Ventilation 4.98% Deep Sternal Wound Infection 0.03% [facility] 5.97% Short Hospital Stay (<6 days)* 31.5%

## 2022-08-15 ENCOUNTER — Telehealth: Payer: Self-pay | Admitting: *Deleted

## 2022-08-15 NOTE — Telephone Encounter (Signed)
Cardiac Catheterization scheduled at [facility] for: Thursday August 17, 2022 10:30 AM Arrival time and place: [place] Entrance A at: 8:30 AM  Nothing to eat after midnight prior to procedure, clear liquids until 5 AM day of procedure  Medication instructions: -Hold:  Losartan/Lasix/HCTZ-day of procedure-per protocol GFR 51  Patient will hold AM of procedure, will take day before pt feels needs to take for BP control.  -Except hold medications usual morning medications can be taken with sips of water including aspirin 81 mg.  Confirmed patient has responsible adult to drive home post procedure and be with patient first 24 hours after arriving home.  Patient reports no new symptoms concerning for COVID-19 in the past 10 days.  Reviewed procedure instructions with patient.

## 2022-08-17 ENCOUNTER — Other Ambulatory Visit: Payer: Self-pay

## 2022-08-17 ENCOUNTER — Ambulatory Visit (HOSPITAL_COMMUNITY)
Admission: RE | Admit: 2022-08-17 | Discharge: 2022-08-17 | Disposition: A | Discharge status: Home / Self Care | Payer: Medicare Other | Source: Ambulatory Visit | Attending: Cardiovascular Disease | Admitting: Cardiovascular Disease

## 2022-08-17 ENCOUNTER — Encounter (HOSPITAL_COMMUNITY): Payer: Self-pay | Admitting: Cardiovascular Disease

## 2022-08-17 ENCOUNTER — Encounter (HOSPITAL_COMMUNITY)
Admission: RE | Disposition: A | Discharge status: Home / Self Care | Payer: Self-pay | Source: Ambulatory Visit | Attending: Cardiovascular Disease

## 2022-08-17 DIAGNOSIS — Z9011 Acquired absence of right breast and nipple: Secondary | ICD-10-CM | POA: Diagnosis not present

## 2022-08-17 DIAGNOSIS — E785 Hyperlipidemia, unspecified: Secondary | ICD-10-CM | POA: Insufficient documentation

## 2022-08-17 DIAGNOSIS — N189 Chronic kidney disease, unspecified: Secondary | ICD-10-CM | POA: Insufficient documentation

## 2022-08-17 DIAGNOSIS — Z87891 Personal history of nicotine dependence: Secondary | ICD-10-CM | POA: Insufficient documentation

## 2022-08-17 DIAGNOSIS — E1122 Type 2 diabetes mellitus with diabetic chronic kidney disease: Secondary | ICD-10-CM | POA: Diagnosis not present

## 2022-08-17 DIAGNOSIS — I350 Nonrheumatic aortic (valve) stenosis: Secondary | ICD-10-CM

## 2022-08-17 DIAGNOSIS — Z853 Personal history of malignant neoplasm of breast: Secondary | ICD-10-CM | POA: Diagnosis not present

## 2022-08-17 DIAGNOSIS — I2510 Atherosclerotic heart disease of native coronary artery without angina pectoris: Secondary | ICD-10-CM | POA: Diagnosis not present

## 2022-08-17 DIAGNOSIS — I129 Hypertensive chronic kidney disease with stage 1 through stage 4 chronic kidney disease, or unspecified chronic kidney disease: Secondary | ICD-10-CM | POA: Diagnosis not present

## 2022-08-17 HISTORY — PX: RIGHT HEART CATH AND CORONARY ANGIOGRAPHY: CATH118264

## 2022-08-17 SURGERY — RIGHT HEART CATH AND CORONARY ANGIOGRAPHY
Anesthesia: LOCAL

## 2022-08-17 MED ORDER — SODIUM CHLORIDE 0.9% FLUSH: 3.0000 mL | INTRAVENOUS | Status: DC | PRN

## 2022-08-17 MED ORDER — SODIUM CHLORIDE 0.9 % IV SOLN: 250.0000 mL | INTRAVENOUS | Status: DC | PRN

## 2022-08-17 MED ORDER — IOHEXOL 350 MG/ML SOLN
INTRAVENOUS | Status: DC | PRN
  Administered 2022-08-17: 45 mL

## 2022-08-17 MED ORDER — FENTANYL CITRATE (PF) 100 MCG/2ML IJ SOLN
INTRAMUSCULAR | Status: AC
  Filled 2022-08-17: qty 2

## 2022-08-17 MED ORDER — LIDOCAINE HCL (PF) 1 % IJ SOLN
INTRAMUSCULAR | Status: AC
  Filled 2022-08-17: qty 30

## 2022-08-17 MED ORDER — VERAPAMIL HCL 2.5 MG/ML IV SOLN
INTRAVENOUS | Status: DC | PRN
  Administered 2022-08-17: 10 mL via INTRA_ARTERIAL

## 2022-08-17 MED ORDER — LIDOCAINE HCL (PF) 1 % IJ SOLN
INTRAMUSCULAR | Status: DC | PRN
  Administered 2022-08-17: 5 mL
  Administered 2022-08-17 (×2): 2 mL

## 2022-08-17 MED ORDER — HEPARIN SODIUM (PORCINE) 1000 UNIT/ML IJ SOLN
INTRAMUSCULAR | Status: DC | PRN
  Administered 2022-08-17: 3000 [IU] via INTRAVENOUS

## 2022-08-17 MED ORDER — ASPIRIN 81 MG PO CHEW: 81.0000 mg | CHEWABLE_TABLET | ORAL | Status: DC

## 2022-08-17 MED ORDER — HEPARIN SODIUM (PORCINE) 1000 UNIT/ML IJ SOLN
INTRAMUSCULAR | Status: AC
Start: 2022-08-17 — End: ?
  Filled 2022-08-17: qty 10

## 2022-08-17 MED ORDER — SODIUM CHLORIDE 0.9 % WEIGHT BASED INFUSION
3.0000 mL/kg/h | INTRAVENOUS | Status: AC
  Administered 2022-08-17: 3 mL/kg/h via INTRAVENOUS

## 2022-08-17 MED ORDER — SODIUM CHLORIDE 0.9 % IV SOLN: INTRAVENOUS | Status: DC

## 2022-08-17 MED ORDER — SODIUM CHLORIDE 0.9% FLUSH: 3.0000 mL | Freq: Two times a day (BID) | INTRAVENOUS | Status: DC

## 2022-08-17 MED ORDER — ONDANSETRON HCL 4 MG/2ML IJ SOLN: 4.0000 mg | Freq: Four times a day (QID) | INTRAMUSCULAR | Status: DC | PRN

## 2022-08-17 MED ORDER — LABETALOL HCL 5 MG/ML IV SOLN: 10.0000 mg | INTRAVENOUS | Status: DC | PRN

## 2022-08-17 MED ORDER — HEPARIN (PORCINE) IN NACL 1000-0.9 UT/500ML-% IV SOLN
INTRAVENOUS | Status: AC
  Filled 2022-08-17: qty 500

## 2022-08-17 MED ORDER — SODIUM CHLORIDE 0.9 % WEIGHT BASED INFUSION: 1.0000 mL/kg/h | INTRAVENOUS | Status: DC

## 2022-08-17 MED ORDER — HYDRALAZINE HCL 20 MG/ML IJ SOLN: 10.0000 mg | INTRAMUSCULAR | Status: DC | PRN

## 2022-08-17 MED ORDER — HEPARIN (PORCINE) IN NACL 1000-0.9 UT/500ML-% IV SOLN
INTRAVENOUS | Status: DC | PRN
  Administered 2022-08-17 (×2): 500 mL

## 2022-08-17 MED ORDER — HEPARIN (PORCINE) IN NACL 1000-0.9 UT/500ML-% IV SOLN
INTRAVENOUS | Status: AC
Start: 2022-08-17 — End: ?
  Filled 2022-08-17: qty 500

## 2022-08-17 MED ORDER — VERAPAMIL HCL 2.5 MG/ML IV SOLN
INTRAVENOUS | Status: AC
  Filled 2022-08-17: qty 2

## 2022-08-17 MED ORDER — FENTANYL CITRATE (PF) 100 MCG/2ML IJ SOLN
INTRAMUSCULAR | Status: DC | PRN
  Administered 2022-08-17: 25 ug via INTRAVENOUS

## 2022-08-17 MED ORDER — ACETAMINOPHEN 325 MG PO TABS: 650.0000 mg | ORAL_TABLET | ORAL | Status: DC | PRN

## 2022-08-17 MED ORDER — MIDAZOLAM HCL 2 MG/2ML IJ SOLN
INTRAMUSCULAR | Status: DC | PRN
  Administered 2022-08-17: 1 mg via INTRAVENOUS

## 2022-08-17 MED ORDER — MIDAZOLAM HCL 2 MG/2ML IJ SOLN
INTRAMUSCULAR | Status: AC
  Filled 2022-08-17: qty 2

## 2022-08-17 SURGICAL SUPPLY — 20 items
CATH 5FR JL3.5 JR4 ANG PIG MP (CATHETERS) IMPLANT
CATH BALLN WEDGE 5F 110CM (CATHETERS) IMPLANT
CATH INFINITI MULTIPACK ANG 4F (CATHETERS) IMPLANT
CLOSURE MYNX CONTROL 5F (Vascular Products) IMPLANT
DEVICE RAD COMP TR BAND LRG (VASCULAR PRODUCTS) IMPLANT
GLIDESHEATH SLEND SS 6F .021 (SHEATH) IMPLANT
GUIDEWIRE .025 260CM (WIRE) IMPLANT
GUIDEWIRE INQWIRE 1.5J.035X260 (WIRE) IMPLANT
INQWIRE 1.5J .035X260CM (WIRE) ×1
KIT HEART LEFT (KITS) ×1 IMPLANT
KIT MICROPUNCTURE NIT STIFF (SHEATH) IMPLANT
PACK CARDIAC CATHETERIZATION (CUSTOM PROCEDURE TRAY) ×1 IMPLANT
SHEATH GLIDE SLENDER 4/5FR (SHEATH) IMPLANT
SHEATH PINNACLE 5F 10CM (SHEATH) IMPLANT
SHEATH PROBE COVER 6X72 (BAG) IMPLANT
SYR MEDRAD MARK 7 150ML (SYRINGE) ×1 IMPLANT
TRANSDUCER W/STOPCOCK (MISCELLANEOUS) ×1 IMPLANT
TUBING CIL FLEX 10 FLL-RA (TUBING) ×1 IMPLANT
WIRE EMERALD 3MM-J .035X150CM (WIRE) IMPLANT
WIRE HI TORQ VERSACORE-J 145CM (WIRE) IMPLANT

## 2022-08-17 NOTE — Interval H&P Note (Signed)
History and Physical Interval Note:  08/17/2022 9:09 AM  Barbara Russell  has presented today for surgery, with the diagnosis of severe aortic stenosis.  The various methods of treatment have been discussed with the patient and family. After consideration of risks, benefits and other options for treatment, the patient has consented to  Procedure(s): RIGHT/LEFT HEART CATH AND CORONARY ANGIOGRAPHY (N/A) as a surgical intervention.  The patient's history has been reviewed, patient examined, no change in status, stable for surgery.  I have reviewed the patient's chart and labs.  Questions were answered to the patient's satisfaction.    Cath Lab Visit (complete for each Cath Lab visit)  Clinical Evaluation Leading to the Procedure:   ACS: No.  Non-ACS:    Anginal Classification: CCS II  Anti-ischemic medical therapy: Minimal Therapy (1 class of medications)  Non-Invasive Test Results: No non-invasive testing performed  Prior CABG: No previous CABG        Lauree Chandler

## 2022-08-20 ENCOUNTER — Other Ambulatory Visit: Payer: Self-pay | Admitting: Cardiology

## 2022-08-22 LAB — POCT I-STAT EG7
Acid-Base Excess: 6 mmol/L — ABNORMAL HIGH (ref 0.0–2.0)
Bicarbonate: 31.4 mmol/L — ABNORMAL HIGH (ref 20.0–28.0)
Calcium, Ion: 1.12 mmol/L — ABNORMAL LOW (ref 1.15–1.40)
HCT: 30 % — ABNORMAL LOW (ref 36.0–46.0)
Hemoglobin: 10.2 g/dL — ABNORMAL LOW (ref 12.0–15.0)
O2 Saturation: 77 %
Potassium: 2.6 mmol/L — CL (ref 3.5–5.1)
Sodium: 130 mmol/L — ABNORMAL LOW (ref 135–145)
TCO2: 33 mmol/L — ABNORMAL HIGH (ref 22–32)
pCO2, Ven: 49.5 mmHg (ref 44–60)
pH, Ven: 7.41 (ref 7.25–7.43)
pO2, Ven: 42 mmHg (ref 32–45)

## 2022-08-22 LAB — POCT I-STAT 7, (LYTES, BLD GAS, ICA,H+H)
Acid-Base Excess: 6 mmol/L — ABNORMAL HIGH (ref 0.0–2.0)
Bicarbonate: 31.6 mmol/L — ABNORMAL HIGH (ref 20.0–28.0)
Calcium, Ion: 1.18 mmol/L (ref 1.15–1.40)
HCT: 31 % — ABNORMAL LOW (ref 36.0–46.0)
Hemoglobin: 10.5 g/dL — ABNORMAL LOW (ref 12.0–15.0)
O2 Saturation: 98 %
Potassium: 2.8 mmol/L — ABNORMAL LOW (ref 3.5–5.1)
Sodium: 128 mmol/L — ABNORMAL LOW (ref 135–145)
TCO2: 33 mmol/L — ABNORMAL HIGH (ref 22–32)
pCO2 arterial: 47.2 mmHg (ref 32–48)
pH, Arterial: 7.433 (ref 7.35–7.45)
pO2, Arterial: 100 mmHg (ref 83–108)

## 2022-08-25 DIAGNOSIS — H353133 Nonexudative age-related macular degeneration, bilateral, advanced atrophic without subfoveal involvement: Secondary | ICD-10-CM | POA: Diagnosis not present

## 2022-08-25 DIAGNOSIS — H5316 Psychophysical visual disturbances: Secondary | ICD-10-CM | POA: Diagnosis not present

## 2022-08-30 ENCOUNTER — Ambulatory Visit (HOSPITAL_COMMUNITY)
Admission: RE | Admit: 2022-08-30 | Discharge: 2022-08-30 | Disposition: A | Discharge status: Home / Self Care | Payer: Medicare Other | Source: Ambulatory Visit | Attending: Cardiovascular Disease | Admitting: Cardiovascular Disease

## 2022-08-30 DIAGNOSIS — I350 Nonrheumatic aortic (valve) stenosis: Secondary | ICD-10-CM | POA: Diagnosis not present

## 2022-08-30 DIAGNOSIS — I700 Atherosclerosis of aorta: Secondary | ICD-10-CM | POA: Diagnosis not present

## 2022-08-30 DIAGNOSIS — H524 Presbyopia: Secondary | ICD-10-CM | POA: Diagnosis not present

## 2022-08-30 DIAGNOSIS — H353133 Nonexudative age-related macular degeneration, bilateral, advanced atrophic without subfoveal involvement: Secondary | ICD-10-CM | POA: Diagnosis not present

## 2022-08-30 DIAGNOSIS — R911 Solitary pulmonary nodule: Secondary | ICD-10-CM | POA: Diagnosis not present

## 2022-08-30 DIAGNOSIS — H26493 Other secondary cataract, bilateral: Secondary | ICD-10-CM | POA: Diagnosis not present

## 2022-08-30 DIAGNOSIS — Z01818 Encounter for other preprocedural examination: Secondary | ICD-10-CM | POA: Diagnosis not present

## 2022-08-30 MED ORDER — IOHEXOL 350 MG/ML SOLN
95.0000 mL | Freq: Once | INTRAVENOUS | Status: AC | PRN
  Administered 2022-08-30: 95 mL via INTRAVENOUS

## 2022-09-06 ENCOUNTER — Ambulatory Visit: Payer: Medicare Other | Attending: Cardiology

## 2022-09-06 ENCOUNTER — Telehealth: Payer: Self-pay | Admitting: Pharmacist

## 2022-09-06 DIAGNOSIS — N1831 Chronic kidney disease, stage 3a: Secondary | ICD-10-CM

## 2022-09-06 NOTE — Telephone Encounter (Addendum)
Patient called stating that she has swelling in her feet again. Cant hardly get her shoes on. Took whole tablet of furosemide('20mg'$ ) for 2 days last last week, no improvement in swelling. Has been taking 1/2 tablet ('10mg'$ ) since then. Lab work from 9/7 during her heart cath showed K of 2.8, Na 128. Will have patient come in for repeat labs before making any adjustments. Reports she feels great other than her hip. Patient will come in today for labs

## 2022-09-07 LAB — BASIC METABOLIC PANEL
BUN/Creatinine Ratio: 25 (ref 12–28)
BUN: 19 mg/dL (ref 8–27)
CO2: 30 mmol/L — ABNORMAL HIGH (ref 20–29)
Calcium: 9.7 mg/dL (ref 8.7–10.3)
Chloride: 83 mmol/L — ABNORMAL LOW (ref 96–106)
Creatinine, Ser: 0.75 mg/dL (ref 0.57–1.00)
Glucose: 101 mg/dL — ABNORMAL HIGH (ref 70–99)
Potassium: 3.5 mmol/L (ref 3.5–5.2)
Sodium: 127 mmol/L — ABNORMAL LOW (ref 134–144)
eGFR: 76 mL/min/{1.73_m2} (ref >59)

## 2022-09-07 MED ORDER — SPIRONOLACTONE 25 MG PO TABS: 25.0000 mg | ORAL_TABLET | Freq: Every day | ORAL | 3 refills | Status: DC

## 2022-09-07 MED ORDER — POTASSIUM CHLORIDE CRYS ER 20 MEQ PO TBCR: EXTENDED_RELEASE_TABLET | ORAL | 3 refills | Status: DC

## 2022-09-07 MED ORDER — FUROSEMIDE 20 MG PO TABS: 20.0000 mg | ORAL_TABLET | Freq: Every day | ORAL | 2 refills | Status: DC

## 2022-09-07 NOTE — Telephone Encounter (Signed)
Na low. K 3.5. Per Dr. Marlou Porch, Stop HCTZ. Start spironolactone '25mg'$  daily.  I have asked patient to take furosemide '40mg'$  daily for 3 days and then decrease to '20mg'$  daily. She had been taking '10mg'$  daily. Peed more when when took '20mg'$  for a few days but swelling did not go down.  Patient has been restricting salt due to swelling. I advised she did not have to be as restrictive.  Patient has hx of hyperkalemia when on spironolactone, therefore I will only have her take KCL supplement when she is taking furosemide '40mg'$ .

## 2022-09-14 ENCOUNTER — Other Ambulatory Visit: Payer: Self-pay | Admitting: Obstetrics and Gynecology

## 2022-09-14 DIAGNOSIS — N6320 Unspecified lump in the left breast, unspecified quadrant: Secondary | ICD-10-CM

## 2022-09-15 NOTE — Telephone Encounter (Signed)
Patient called stating her swelling is slightly better. Can barely get her shoes on. Denies any pain or warmth. When she presses on her foot it pops right back out. I advised her to try compression stockings. She does elevate her legs, sleeps in recliner. Continue furosemide '20mg'$  daily. Checking labs next week. Denies any SOB at rest.

## 2022-09-21 ENCOUNTER — Other Ambulatory Visit: Payer: Self-pay | Admitting: Obstetrics and Gynecology

## 2022-09-21 ENCOUNTER — Ambulatory Visit
Admission: RE | Admit: 2022-09-21 | Discharge: 2022-09-21 | Disposition: A | Discharge status: Home / Self Care | Payer: Medicare Other | Source: Ambulatory Visit | Attending: Obstetrics and Gynecology | Admitting: Obstetrics and Gynecology

## 2022-09-21 DIAGNOSIS — R922 Inconclusive mammogram: Secondary | ICD-10-CM | POA: Diagnosis not present

## 2022-09-21 DIAGNOSIS — N6320 Unspecified lump in the left breast, unspecified quadrant: Secondary | ICD-10-CM

## 2022-09-21 DIAGNOSIS — N6322 Unspecified lump in the left breast, upper inner quadrant: Secondary | ICD-10-CM | POA: Diagnosis not present

## 2022-09-21 DIAGNOSIS — N6324 Unspecified lump in the left breast, lower inner quadrant: Secondary | ICD-10-CM | POA: Diagnosis not present

## 2022-09-22 ENCOUNTER — Ambulatory Visit: Payer: Medicare Other | Attending: Cardiology

## 2022-09-22 DIAGNOSIS — N1831 Chronic kidney disease, stage 3a: Secondary | ICD-10-CM

## 2022-09-22 LAB — BASIC METABOLIC PANEL
BUN/Creatinine Ratio: 29 — ABNORMAL HIGH (ref 12–28)
BUN: 26 mg/dL (ref 8–27)
CO2: 25 mmol/L (ref 20–29)
Calcium: 10 mg/dL (ref 8.7–10.3)
Chloride: 94 mmol/L — ABNORMAL LOW (ref 96–106)
Creatinine, Ser: 0.9 mg/dL (ref 0.57–1.00)
Glucose: 181 mg/dL — ABNORMAL HIGH (ref 70–99)
Potassium: 4.4 mmol/L (ref 3.5–5.2)
Sodium: 136 mmol/L (ref 134–144)
eGFR: 61 mL/min/{1.73_m2} (ref >59)

## 2022-09-26 ENCOUNTER — Telehealth: Payer: Self-pay | Admitting: Pharmacist

## 2022-09-26 DIAGNOSIS — R609 Edema, unspecified: Secondary | ICD-10-CM

## 2022-09-26 NOTE — Telephone Encounter (Signed)
Reviewed results with patient. She has not been taking KCL supplements. She only took on the 3 days that she took '40mg'$  of furosemide. Wearing compression stockings now. Swelling a little better. Due to hx of hyperkalemia of spironolactone, will still have her come for recheck in 3 weeks.

## 2022-09-28 ENCOUNTER — Institutional Professional Consult (permissible substitution) (INDEPENDENT_AMBULATORY_CARE_PROVIDER_SITE_OTHER): Payer: Medicare Other | Admitting: Cardiothoracic Surgery

## 2022-09-28 VITALS — BP 172/69 | HR 70 | Resp 20 | Ht 63.0 in | Wt 131.9 lb

## 2022-09-28 DIAGNOSIS — I350 Nonrheumatic aortic (valve) stenosis: Secondary | ICD-10-CM

## 2022-10-01 ENCOUNTER — Other Ambulatory Visit: Payer: Self-pay | Admitting: Cardiology

## 2022-10-02 ENCOUNTER — Other Ambulatory Visit: Payer: Self-pay

## 2022-10-02 DIAGNOSIS — I350 Nonrheumatic aortic (valve) stenosis: Secondary | ICD-10-CM

## 2022-10-05 NOTE — Progress Notes (Signed)
Surgical Instructions    Your procedure is scheduled on Tuesday, October 31st, 2023.   Report to [facility] Main Entrance "A" at 05:30 A.M., then check in with the Admitting office.  Call this number if you have problems the morning of surgery:  [phone]   If you have any questions prior to your surgery date call [phone]: Open Monday-Friday 8am-4pm  If you experience any cold or flu symptoms such as cough, fever, chills, shortness of breath, etc. between now and your scheduled surgery, please notify us at the above number     Remember:  Do not eat or drink after midnight the night before your surgery   STOP on Wednesday, October 25th, taking any Aleve, Naproxen, Ibuprofen, Motrin, Advil, Goody's, BC's, all herbal medications, fish oil, and all non-prescription vitamins.   Continue taking all other medications including Aspirin without change through the day before surgery. On the morning of surgery do not take any medications.   HOW TO MANAGE YOUR DIABETES BEFORE AND AFTER SURGERY  Why is it important to control my blood sugar before and after surgery? Improving blood sugar levels before and after surgery helps healing and can limit problems. A way of improving blood sugar control is eating a healthy diet by:  Eating less sugar and carbohydrates  Increasing activity/exercise  Talking with your doctor about reaching your blood sugar goals High blood sugars (greater than 180 mg/dL) can raise your risk of infections and slow your recovery, so you will need to focus on controlling your diabetes during the weeks before surgery. Make sure that the doctor who takes care of your diabetes knows about your planned surgery including the date and location.  How do I manage my blood sugar before surgery? Check your blood sugar at least 4 times a day, starting 2 days before surgery, to make sure that the level is not too high or low.  Check your blood sugar the morning of your surgery  when you wake up and every 2 hours until you get to the Short Stay unit.  If your blood sugar is less than 70 mg/dL, you will need to treat for low blood sugar: Do not take insulin. Treat a low blood sugar (less than 70 mg/dL) with  cup of clear juice (cranberry or apple), 4 glucose tablets, OR glucose gel. Recheck blood sugar in 15 minutes after treatment (to make sure it is greater than 70 mg/dL). If your blood sugar is not greater than 70 mg/dL on recheck, call [phone] for further instructions. Report your blood sugar to the short stay nurse when you get to Short Stay.  If you are admitted to the hospital after surgery: Your blood sugar will be checked by the staff and you will probably be given insulin after surgery (instead of oral diabetes medicines) to make sure you have good blood sugar levels. The goal for blood sugar control after surgery is 80-180 mg/dL.    The day if surgery:          Do not wear jewelry or makeup. Do not wear lotions, powders, perfumes or deodorant. Do not shave 48 hours prior to surgery.   Do not bring valuables to the hospital. Do not wear nail polish, gel polish, artificial nails, or any other type of covering on natural nails (fingers and toes) If you have artificial nails or gel coating that need to be removed by a nail salon, please have this removed prior to surgery. Artificial nails or gel coating may  interfere with anesthesia's ability to adequately monitor your vital signs.  [facility] is not responsible for any belongings or valuables.    Do NOT Smoke (Tobacco/Vaping)  24 hours prior to your procedure  If you use a CPAP at night, you may bring your mask for your overnight stay.   Contacts, glasses, hearing aids, dentures or partials may not be worn into surgery, please bring cases for these belongings   For patients admitted to the hospital, discharge time will be determined by your treatment team.   Patients discharged the day of surgery  will not be allowed to drive home, and someone needs to stay with them for 24 hours.   SURGICAL WAITING ROOM VISITATION Patients having surgery or a procedure may have no more than 2 support people in the waiting area - these visitors may rotate.   Children under the age of 13 must have an adult with them who is not the patient. If the patient needs to stay at the hospital during part of their recovery, the visitor guidelines for inpatient rooms apply. Pre-op nurse will coordinate an appropriate time for 1 support person to accompany patient in pre-op.  This support person may not rotate.   Please refer to [email or web address] for the visitor guidelines for Inpatients (after your surgery is over and you are in a regular room).    Special instructions:    Oral Hygiene is also important to reduce your risk of infection.  Remember - BRUSH YOUR TEETH THE MORNING OF SURGERY WITH YOUR REGULAR TOOTHPASTE   [facility]- Preparing For Surgery  Before surgery, you can play an important role. Because skin is not sterile, your skin needs to be as free of germs as possible. You can reduce the number of germs on your skin by washing with CHG (chlorahexidine gluconate) Soap before surgery.  CHG is an antiseptic cleaner which kills germs and bonds with the skin to continue killing germs even after washing.     Please do not use if you have an allergy to CHG or antibacterial soaps. If your skin becomes reddened/irritated stop using the CHG.  Do not shave (including legs and underarms) for at least 48 hours prior to first CHG shower. It is OK to shave your face.  Please follow these instructions carefully.     Shower the NIGHT BEFORE SURGERY and the MORNING OF SURGERY with CHG Soap.   If you chose to wash your hair, wash your hair first as usual with your normal shampoo. After you shampoo, rinse your hair and body thoroughly to remove the shampoo.  Then  [employer] and genitals (private parts) with your normal soap and rinse thoroughly to remove soap.  After that Use CHG Soap as you would any other liquid soap. You can apply CHG directly to the skin and wash gently with a scrungie or a clean washcloth.   Apply the CHG Soap to your body ONLY FROM THE NECK DOWN.  Do not use on open wounds or open sores. Avoid contact with your eyes, ears, mouth and genitals (private parts). Wash Face and genitals (private parts)  with your normal soap.   Wash thoroughly, paying special attention to the area where your surgery will be performed.  Thoroughly rinse your body with warm water from the neck down.  DO NOT shower/wash with your normal soap after using and rinsing off the CHG Soap.  Pat yourself dry with a CLEAN TOWEL.  Wear CLEAN PAJAMAS to bed the night  before surgery  Place CLEAN SHEETS on your bed the night before your surgery  DO NOT SLEEP WITH PETS.   Day of Surgery:  Take a shower with CHG soap. Wear Clean/Comfortable clothing the morning of surgery Do not apply any deodorants/lotions.   Remember to brush your teeth WITH YOUR REGULAR TOOTHPASTE.    If you received a COVID test during your pre-op visit, it is requested that you wear a mask when out in public, stay away from anyone that may not be feeling well, and notify your surgeon if you develop symptoms. If you have been in contact with anyone that has tested positive in the last 10 days, please notify your surgeon.    Please read over the following fact sheets that you were given.

## 2022-10-06 ENCOUNTER — Encounter (HOSPITAL_COMMUNITY)
Admission: RE | Admit: 2022-10-06 | Discharge: 2022-10-06 | Disposition: A | Discharge status: Home / Self Care | Payer: Medicare Other | Source: Ambulatory Visit | Attending: Cardiovascular Disease | Admitting: Cardiovascular Disease

## 2022-10-06 ENCOUNTER — Ambulatory Visit (HOSPITAL_COMMUNITY)
Admission: RE | Admit: 2022-10-06 | Discharge: 2022-10-06 | Disposition: A | Discharge status: Home / Self Care | Payer: Medicare Other | Source: Ambulatory Visit | Attending: Cardiovascular Disease | Admitting: Cardiovascular Disease

## 2022-10-06 DIAGNOSIS — W1839XA Other fall on same level, initial encounter: Secondary | ICD-10-CM | POA: Insufficient documentation

## 2022-10-06 DIAGNOSIS — Z1152 Encounter for screening for COVID-19: Secondary | ICD-10-CM | POA: Diagnosis not present

## 2022-10-06 DIAGNOSIS — I350 Nonrheumatic aortic (valve) stenosis: Secondary | ICD-10-CM

## 2022-10-06 DIAGNOSIS — E119 Type 2 diabetes mellitus without complications: Secondary | ICD-10-CM | POA: Insufficient documentation

## 2022-10-06 DIAGNOSIS — S81011A Laceration without foreign body, right knee, initial encounter: Secondary | ICD-10-CM | POA: Insufficient documentation

## 2022-10-06 DIAGNOSIS — G8929 Other chronic pain: Secondary | ICD-10-CM | POA: Diagnosis not present

## 2022-10-06 DIAGNOSIS — W19XXXA Unspecified fall, initial encounter: Secondary | ICD-10-CM | POA: Diagnosis not present

## 2022-10-06 DIAGNOSIS — Z96651 Presence of right artificial knee joint: Secondary | ICD-10-CM | POA: Insufficient documentation

## 2022-10-06 DIAGNOSIS — R9431 Abnormal electrocardiogram [ECG] [EKG]: Secondary | ICD-10-CM | POA: Insufficient documentation

## 2022-10-06 DIAGNOSIS — Z01818 Encounter for other preprocedural examination: Secondary | ICD-10-CM

## 2022-10-06 DIAGNOSIS — Z7982 Long term (current) use of aspirin: Secondary | ICD-10-CM | POA: Insufficient documentation

## 2022-10-06 DIAGNOSIS — Z79899 Other long term (current) drug therapy: Secondary | ICD-10-CM | POA: Insufficient documentation

## 2022-10-06 LAB — TYPE AND SCREEN
ABO/RH(D): O POS
Antibody Screen: NEGATIVE

## 2022-10-06 LAB — COMPREHENSIVE METABOLIC PANEL
ALT: 21 U/L (ref 0–44)
AST: 26 U/L (ref 15–41)
Albumin: 4 g/dL (ref 3.5–5.0)
Alkaline Phosphatase: 110 U/L (ref 38–126)
Anion gap: 10 (ref 5–15)
BUN: 19 mg/dL (ref 8–23)
CO2: 26 mmol/L (ref 22–32)
Calcium: 9.6 mg/dL (ref 8.9–10.3)
Chloride: 96 mmol/L — ABNORMAL LOW (ref 98–111)
Creatinine, Ser: 0.9 mg/dL (ref 0.44–1.00)
GFR, Estimated: >60 mL/min (ref >60)
Glucose, Bld: 109 mg/dL — ABNORMAL HIGH (ref 70–99)
Potassium: 4.5 mmol/L (ref 3.5–5.1)
Sodium: 132 mmol/L — ABNORMAL LOW (ref 135–145)
Total Bilirubin: 0.7 mg/dL (ref 0.3–1.2)
Total Protein: 6.6 g/dL (ref 6.5–8.1)

## 2022-10-06 LAB — SURGICAL PCR SCREEN
MRSA, PCR: NEGATIVE
Staphylococcus aureus: NEGATIVE

## 2022-10-06 LAB — CBC
HCT: 36.6 % (ref 36.0–46.0)
Hemoglobin: 11.9 g/dL — ABNORMAL LOW (ref 12.0–15.0)
MCH: 31.2 pg (ref 26.0–34.0)
MCHC: 32.5 g/dL (ref 30.0–36.0)
MCV: 95.8 fL (ref 80.0–100.0)
Platelets: 231 10*3/uL (ref 150–400)
RBC: 3.82 MIL/uL — ABNORMAL LOW (ref 3.87–5.11)
RDW: 13.4 % (ref 11.5–15.5)
WBC: 6.1 10*3/uL (ref 4.0–10.5)
nRBC: 0 % (ref 0.0–0.2)

## 2022-10-06 LAB — SARS CORONAVIRUS 2 (TAT 6-24 HRS): SARS Coronavirus 2: NEGATIVE

## 2022-10-06 LAB — PROTIME-INR
INR: 1 (ref 0.8–1.2)
Prothrombin Time: 13 seconds (ref 11.4–15.2)

## 2022-10-06 NOTE — Progress Notes (Signed)
Pt seen in PAT for labs, EKG, CXR and Covid testing. Informed consent obtained for procedure. All concerns addressed during this visit.    Jacqlyn Larsen, RN

## 2022-10-06 NOTE — Progress Notes (Signed)
Pt unable to void during PAT appt. Will need to collect sample DOS.   Jacqlyn Larsen, RN

## 2022-10-07 ENCOUNTER — Emergency Department (HOSPITAL_BASED_OUTPATIENT_CLINIC_OR_DEPARTMENT_OTHER)
Admission: EM | Admit: 2022-10-07 | Discharge: 2022-10-07 | Disposition: A | Discharge status: Home / Self Care | Payer: Medicare Other | Attending: Emergency Medicine | Admitting: Emergency Medicine

## 2022-10-07 ENCOUNTER — Encounter (HOSPITAL_BASED_OUTPATIENT_CLINIC_OR_DEPARTMENT_OTHER): Payer: Self-pay | Admitting: Emergency Medicine

## 2022-10-07 ENCOUNTER — Emergency Department (HOSPITAL_BASED_OUTPATIENT_CLINIC_OR_DEPARTMENT_OTHER): Payer: Medicare Other

## 2022-10-07 ENCOUNTER — Other Ambulatory Visit: Payer: Self-pay

## 2022-10-07 DIAGNOSIS — Z043 Encounter for examination and observation following other accident: Secondary | ICD-10-CM | POA: Diagnosis not present

## 2022-10-07 DIAGNOSIS — Z885 Allergy status to narcotic agent status: Secondary | ICD-10-CM | POA: Diagnosis not present

## 2022-10-07 DIAGNOSIS — Z91011 Allergy to milk products: Secondary | ICD-10-CM | POA: Diagnosis not present

## 2022-10-07 DIAGNOSIS — E871 Hypo-osmolality and hyponatremia: Secondary | ICD-10-CM | POA: Diagnosis not present

## 2022-10-07 DIAGNOSIS — E1122 Type 2 diabetes mellitus with diabetic chronic kidney disease: Secondary | ICD-10-CM | POA: Diagnosis not present

## 2022-10-07 DIAGNOSIS — F419 Anxiety disorder, unspecified: Secondary | ICD-10-CM | POA: Diagnosis not present

## 2022-10-07 DIAGNOSIS — Z87440 Personal history of urinary (tract) infections: Secondary | ICD-10-CM | POA: Diagnosis not present

## 2022-10-07 DIAGNOSIS — Z952 Presence of prosthetic heart valve: Secondary | ICD-10-CM | POA: Diagnosis not present

## 2022-10-07 DIAGNOSIS — N1831 Chronic kidney disease, stage 3a: Secondary | ICD-10-CM | POA: Diagnosis not present

## 2022-10-07 DIAGNOSIS — I2510 Atherosclerotic heart disease of native coronary artery without angina pectoris: Secondary | ICD-10-CM | POA: Diagnosis not present

## 2022-10-07 DIAGNOSIS — M25551 Pain in right hip: Secondary | ICD-10-CM | POA: Diagnosis present

## 2022-10-07 DIAGNOSIS — S81011A Laceration without foreign body, right knee, initial encounter: Secondary | ICD-10-CM

## 2022-10-07 DIAGNOSIS — I350 Nonrheumatic aortic (valve) stenosis: Secondary | ICD-10-CM | POA: Diagnosis not present

## 2022-10-07 DIAGNOSIS — M1990 Unspecified osteoarthritis, unspecified site: Secondary | ICD-10-CM | POA: Diagnosis present

## 2022-10-07 DIAGNOSIS — E7800 Pure hypercholesterolemia, unspecified: Secondary | ICD-10-CM | POA: Diagnosis not present

## 2022-10-07 DIAGNOSIS — Z880 Allergy status to penicillin: Secondary | ICD-10-CM | POA: Diagnosis not present

## 2022-10-07 DIAGNOSIS — Z853 Personal history of malignant neoplasm of breast: Secondary | ICD-10-CM | POA: Diagnosis not present

## 2022-10-07 DIAGNOSIS — Z96651 Presence of right artificial knee joint: Secondary | ICD-10-CM | POA: Diagnosis present

## 2022-10-07 DIAGNOSIS — G4700 Insomnia, unspecified: Secondary | ICD-10-CM | POA: Diagnosis not present

## 2022-10-07 DIAGNOSIS — N189 Chronic kidney disease, unspecified: Secondary | ICD-10-CM | POA: Diagnosis not present

## 2022-10-07 DIAGNOSIS — I080 Rheumatic disorders of both mitral and aortic valves: Secondary | ICD-10-CM | POA: Diagnosis not present

## 2022-10-07 DIAGNOSIS — Z006 Encounter for examination for normal comparison and control in clinical research program: Secondary | ICD-10-CM | POA: Diagnosis not present

## 2022-10-07 DIAGNOSIS — Z881 Allergy status to other antibiotic agents status: Secondary | ICD-10-CM | POA: Diagnosis not present

## 2022-10-07 DIAGNOSIS — Z87891 Personal history of nicotine dependence: Secondary | ICD-10-CM | POA: Diagnosis not present

## 2022-10-07 DIAGNOSIS — Z9011 Acquired absence of right breast and nipple: Secondary | ICD-10-CM | POA: Diagnosis not present

## 2022-10-07 DIAGNOSIS — R918 Other nonspecific abnormal finding of lung field: Secondary | ICD-10-CM | POA: Diagnosis not present

## 2022-10-07 DIAGNOSIS — I10 Essential (primary) hypertension: Secondary | ICD-10-CM | POA: Diagnosis not present

## 2022-10-07 DIAGNOSIS — I129 Hypertensive chronic kidney disease with stage 1 through stage 4 chronic kidney disease, or unspecified chronic kidney disease: Secondary | ICD-10-CM | POA: Diagnosis not present

## 2022-10-07 DIAGNOSIS — I252 Old myocardial infarction: Secondary | ICD-10-CM | POA: Diagnosis not present

## 2022-10-07 DIAGNOSIS — I2583 Coronary atherosclerosis due to lipid rich plaque: Secondary | ICD-10-CM | POA: Diagnosis not present

## 2022-10-07 MED ORDER — HYDROCODONE-ACETAMINOPHEN 5-325 MG PO TABS: 1.0000 | ORAL_TABLET | Freq: Four times a day (QID) | ORAL | 0 refills | Status: DC | PRN

## 2022-10-07 MED ORDER — LIDOCAINE HCL (PF) 1 % IJ SOLN
10.0000 mL | Freq: Once | INTRAMUSCULAR | Status: AC
  Administered 2022-10-07: 10 mL via INTRADERMAL

## 2022-10-07 MED ORDER — HYDROCODONE-ACETAMINOPHEN 5-325 MG PO TABS
1.0000 | ORAL_TABLET | Freq: Once | ORAL | Status: AC
  Administered 2022-10-07: 1 via ORAL
  Filled 2022-10-07: qty 1

## 2022-10-07 NOTE — ED Triage Notes (Signed)
Pt presents from [facility] via [place] for fall without head injury resulting in laceration to R knee (bleeding controlled prior to arrival, described as a skin tear by EMS). Also fell onto R hip but is not experiencing any new pain to this joint and was able to ambulate to stretcher with walker.   Not on blood thinners, A&Ox4.

## 2022-10-07 NOTE — ED Notes (Signed)
Pt lives in independent living at [facility]

## 2022-10-07 NOTE — ED Provider Notes (Signed)
[facility] EMERGENCY DEPT Provider Note   CSN: [id number] Arrival date & time: 10/06/22  2349     History  Chief Complaint  Patient presents with   Laceration    Arlana Canizales Carneal is a 88 y.o. female.  Patient is an 88 year old female with past medical history of prior right total knee replacement with revision in June 2023, hyperlipidemia, diabetes.  Patient presenting today for evaluation of a right knee laceration.  Patient reports sliding to the floor at her extended care facility and causing a laceration to her right knee.  She denies other injury.  She denies any back pain or hip pain.  She denies having struck her head or loss of consciousness.  Bleeding controlled with direct pressure.  The history is provided by the patient.       Home Medications Prior to Admission medications   Medication Sig Start Date End Date Taking? Authorizing Provider  acetaminophen (TYLENOL) 325 MG tablet Take 650 mg by mouth every 6 (six) hours as needed for moderate pain or mild pain.    [provider]  aspirin 81 MG tablet Take 1 tablet (81 mg total) by mouth daily. 06/07/22   Gaynelle Arabian, MD  b complex vitamins capsule Take 1 capsule by mouth in the morning.    [provider]  Camphor-Menthol-Methyl Sal (SALONPAS) 3.12-16-08 % PTCH Place 1 patch onto the skin daily as needed (pain).    [provider]  cetirizine (ZYRTEC) 10 MG tablet Take 10 mg by mouth daily as needed for allergies or rhinitis.    [provider]  Cholecalciferol (VITAMIN D3 PO) Take 1,000 Units by mouth in the morning.    [provider]  CRANBERRY PO Take 1 tablet by mouth at bedtime.    [provider]  furosemide (LASIX) 20 MG tablet TAKE 1/2 TABLET BY MOUTH AS NEEDED Patient taking differently: Take 20 mg by mouth in the morning. 10/03/22   Jerline Pain, MD  gabapentin (NEURONTIN) 300 MG capsule Take 600 mg by mouth at bedtime.     [provider]  hydrALAZINE (APRESOLINE) 25 MG tablet Take 1 tablet (25 mg total) by mouth daily as needed (blood pressure of 160 or above). Patient not taking: Reported on 10/04/2022 06/28/22   Jerline Pain, MD  isosorbide mononitrate (IMDUR) 30 MG 24 hr tablet TAKE 1 TABLET BY MOUTH EVERY DAY Patient taking differently: Take 30 mg by mouth daily at 4 PM. 08/22/22   Jerline Pain, MD  losartan (COZAAR) 50 MG tablet Take 1 tablet (50 mg total) by mouth 2 (two) times daily. Patient taking differently: Take 50 mg by mouth See admin instructions. Take 1 tablet (50 mg) by mouth scheduled every morning, may repeat dose in the afternoon if blood pressure is elevated. 02/23/22   Jerline Pain, MD  mineral oil-hydrophilic petrolatum (AQUAPHOR) ointment Apply 1 Application topically daily as needed for irritation.    [provider]  Multiple Vitamins-Minerals (PRESERVISION AREDS 2) CAPS Take 1 capsule by mouth 2 (two) times daily.    [provider]  nitroGLYCERIN (NITROSTAT) 0.4 MG SL tablet PLACE 1 TABLET UNDER THE TONGUE EVERY 5 MINUTES AS NEEDED FOR CHEST PAIN. 04/11/22   Jerline Pain, MD  Omega-3 Fatty Acids (OMEGA-3 FISH OIL PO) Take 1,000 mg by mouth every Monday, Tuesday, Wednesday, Thursday, and Friday.    [provider]  ondansetron (ZOFRAN) 4 MG tablet Take 4 mg by mouth every 4 (four)  hours as needed for vomiting or nausea.    [provider]  [facility] ULTRA test strip CHECK ONCE DAILY AS DIRECTED 05/29/21   [provider]  Polyethyl Glycol-Propyl Glycol (SYSTANE OP) Place 1 drop into both eyes in the morning and at bedtime.    [provider]  saccharomyces boulardii (FLORASTOR) 250 MG capsule Take 250-500 mg by mouth daily in the afternoon. 12/23/21   [provider]  spironolactone (ALDACTONE) 25 MG tablet Take 1 tablet (25 mg total) by mouth daily. 09/07/22   Jerline Pain, MD  zolpidem (AMBIEN) 5 MG tablet Take 7.5 mg by mouth  at bedtime.    [provider]      Allergies    Amlodipine, Benadryl [diphenhydramine hcl (sleep)], Broccoli flavor [flavoring agent], Brussels sprouts [brassica oleracea], Cabbage, Ciprofloxacin, Collard greens [wild lettuce extract (lactuca virosa)], Lactose intolerance (gi), Macrobid [nitrofurantoin monohyd macro], Naproxen, Quinine sulfate [quinine], Rosuvastatin, Sulfa antibiotics, Tramadol, Penicillins, and Prednisone    Review of Systems   Review of Systems  All other systems reviewed and are negative.   Physical Exam Updated Vital Signs BP (!) 155/57 (BP Location: Right Arm)   Pulse 68   Temp (!) 97.5 F (36.4 C) (Temporal)   Resp 20   Wt 60 kg   LMP  (LMP Unknown)   SpO2 98%   BMI 23.43 kg/m  Physical Exam Vitals and nursing note reviewed.  Constitutional:      General: She is not in acute distress.    Appearance: Normal appearance. She is not ill-appearing.  HENT:     Head: Normocephalic and atraumatic.  Pulmonary:     Effort: Pulmonary effort is normal.  Musculoskeletal:     Comments: The right knee has a 10 cm, L-shaped laceration that runs along the scar line from the prior knee surgery.  This involves the subcutaneous tissues, but does not extend deeper.  There is no joint involvement.  Skin:    General: Skin is warm and dry.  Neurological:     General: No focal deficit present.     Mental Status: She is alert and oriented to person, place, and time.     Cranial Nerves: No cranial nerve deficit.     ED Results / Procedures / Treatments   Labs (all labs ordered are listed, but only abnormal results are displayed) Labs Reviewed - No data to display  EKG None  Radiology No results found.  Procedures Procedures    Medications Ordered in ED Medications  lidocaine (PF) (XYLOCAINE) 1 % injection 10 mL (has no administration in time range)    ED Course/ Medical Decision Making/ A&P  Patient is an 88 year old female presenting with a  right knee laceration.  Patient fell at her independent living facility and injured the knee.  She has had devious total knee replacement with revision several months ago.  Patient arrives here with stable vital signs.  She does have a large, 10 cm laceration to the anterior aspect of the knee extending primarily down the partial length of the prior surgical incision.  X-rays show no acute osseous abnormality or evidence for hardware complication.  Laceration repaired as below.  Patient to be discharged with local wound care and suture removal in 2 weeks.  LACERATION REPAIR Performed by: Veryl Speak Authorized by: Veryl Speak Consent: Verbal consent obtained. Risks and benefits: risks, benefits and alternatives were discussed Consent given by: patient Patient identity confirmed: provided demographic data Prepped and Draped in normal sterile  fashion Wound explored  Laceration Location: Right knee  Laceration Length: 10 cm  No Foreign Bodies seen or palpated  Anesthesia: local infiltration  Local anesthetic: lidocaine 1% without epinephrine  Anesthetic total: 8 ml  Irrigation method: syringe Amount of cleaning: standard  Skin closure: 4-0 Ethilon  Number of sutures: 16  Technique: Simple interrupted  Patient tolerance: Patient tolerated the procedure well with no immediate complications.   Final Clinical Impression(s) / ED Diagnoses Final diagnoses:  None    Rx / DC Orders ED Discharge Orders     None         Veryl Speak, MD 10/07/22 [phone]

## 2022-10-07 NOTE — Discharge Instructions (Addendum)
Local wound care with bacitracin and dressing changes twice daily.  Take hydrocodone as prescribed as needed for pain.  Only minimal knee bending is allowed until the wound is healed.  Follow-up with your primary doctor in 2 weeks for suture removal.  Return to the ER in the meantime if you develop pus draining from the wound, redness surrounding the wound, red streaks up or down the leg, or for other new and concerning symptoms.

## 2022-10-08 NOTE — Progress Notes (Signed)
[address]       [place] [address]             [phone]        Jory F Fonseca [facility] Medical Record #[id number] Date of Birth: 05/12/1930  Referring: Jerline Pain, MD Primary Care: Seward Carol, MD Primary Cardiologist:Mark Marlou Porch, MD  Chief Complaint:    Chief Complaint  Patient presents with   Aortic Stenosis    Surgical consult for TAVR, review all testing    History of Present Illness:     :88 yo female with history of anxiety, breast cancer s/p mastectomy, CKD, borderline diabetes, HTN, hyperlipidemia and severe aortic stenosis who is here today for TAVR evaluation.She had progressive SOB and lives in a senior care facility. No active dental issues.    The aortic valve is thickened and calcified with mean gradient 29 mmHg, peak gradient 51.3 mmHg, AVA 0.74 cm2, DI 0.24. This is consistent with paradoxical low flow/low gradient aortic stenosis.    Past Medical History:  Diagnosis Date   Anxiety    Breast cancer ([facility])    Hx of BC, s/p mastectomy on right 1999, s/p tamoxifen X1 year   Chest pain    EC Stress test 12/21/11 Low risk, no ischemia, normal EF   Chronic kidney disease    Coronary artery disease    Remote Hx of CAD, normal stress test 2009   Diabetes mellitus without complication (HCC)    Diverticulitis    GERD (gastroesophageal reflux disease)    No longer a problem   Hypercholesteremia    Hypertension    Insomnia    Lichen sclerosus    Low back pain    Myocardial infarction ([place]) 12/11/1998   Osteoarthrosis, unspecified whether generalized or localized, unspecified site    Pain    Several year Hx of pain/ SOB radiating to her neck   Severe aortic stenosis    UTI (urinary tract infection)     Past Surgical History:  Procedure Laterality Date   ABDOMINAL HYSTERECTOMY     APPENDECTOMY     HAND SURGERY Bilateral    CTS and trigger finger on right and left   MASTECTOMY Right 12/11/1997   REPLACEMENT TOTAL KNEE  Right 11/10/2009   RIGHT HEART CATH AND CORONARY ANGIOGRAPHY N/A 08/17/2022   Procedure: RIGHT HEART CATH AND CORONARY ANGIOGRAPHY;  Surgeon: Burnell Blanks, MD;  Location: [facility];  Service: Cardiovascular;  Laterality: N/A;   TOTAL KNEE REVISION Right 05/17/2022   Procedure: Right knee polyethylene revision;  Surgeon: Gaynelle Arabian, MD;  Location: [facility];  Service: Orthopedics;  Laterality: Right;   VESICOVAGINAL FISTULA CLOSURE W/ TAH  12/12/1967    Social History   Tobacco Use  Smoking Status Former   Packs/day: 1.00   Types: Cigarettes   Quit date: 07/08/1997   Years since quitting: 25.2  Smokeless Tobacco Never    Social History   Substance and Sexual Activity  Alcohol Use No     Allergies  Allergen Reactions   Amlodipine Swelling    LEE with '5mg'$  dose  Foot swelling   Benadryl [Diphenhydramine Hcl (Sleep)] Other (See Comments)    Hyperstimulation   Broccoli Flavor [Flavoring Agent] Diarrhea   Brussels Sprouts [Brassica Oleracea] Diarrhea   Cabbage Diarrhea   Ciprofloxacin Hives   Collard Greens [Wild Lettuce Extract (Lactuca Virosa)] Diarrhea   Lactose Intolerance (Gi) Diarrhea   Macrobid [Nitrofurantoin Monohyd Macro] Nausea And Vomiting  Naproxen Other (See Comments)    Stomach pain   Quinine Sulfate [Quinine] Hives   Rosuvastatin     headache   Sulfa Antibiotics Hives        Tramadol Other (See Comments)    unknown to patient   Penicillins Rash    Has patient had a PCN reaction causing immediate rash, facial/tongue/throat swelling, SOB or lightheadedness with hypotension: Yes Has patient had a PCN reaction causing severe rash involving mucus membranes or skin necrosis: No Has patient had a PCN reaction that required hospitalization: No Has patient had a PCN reaction occurring within the last 10 years: No If all of the above answers are "NO", then may proceed with Cephalosporin use. Tolerated Cephalosporin Date: 05/19/22.      Prednisone Other (See Comments)    Weakness and pain in joints    Current Outpatient Medications  Medication Sig Dispense Refill   acetaminophen (TYLENOL) 325 MG tablet Take 650 mg by mouth every 6 (six) hours as needed for moderate pain or mild pain.     aspirin 81 MG tablet Take 1 tablet (81 mg total) by mouth daily. 30 tablet 0   b complex vitamins capsule Take 1 capsule by mouth in the morning.     Camphor-Menthol-Methyl Sal (SALONPAS) 3.12-16-08 % PTCH Place 1 patch onto the skin daily as needed (pain).     cetirizine (ZYRTEC) 10 MG tablet Take 10 mg by mouth daily as needed for allergies or rhinitis.     Cholecalciferol (VITAMIN D3 PO) Take 1,000 Units by mouth in the morning.     gabapentin (NEURONTIN) 300 MG capsule Take 600 mg by mouth at bedtime.      hydrALAZINE (APRESOLINE) 25 MG tablet Take 1 tablet (25 mg total) by mouth daily as needed (blood pressure of 160 or above). (Patient not taking: Reported on 10/04/2022) 90 tablet 3   isosorbide mononitrate (IMDUR) 30 MG 24 hr tablet TAKE 1 TABLET BY MOUTH EVERY DAY (Patient taking differently: Take 30 mg by mouth daily at 4 PM.) 90 tablet 3   losartan (COZAAR) 50 MG tablet Take 1 tablet (50 mg total) by mouth 2 (two) times daily. (Patient taking differently: Take 50 mg by mouth See admin instructions. Take 1 tablet (50 mg) by mouth scheduled every morning, may repeat dose in the afternoon if blood pressure is elevated.) 180 tablet 3   mineral oil-hydrophilic petrolatum (AQUAPHOR) ointment Apply 1 Application topically daily as needed for irritation.     Multiple Vitamins-Minerals (PRESERVISION AREDS 2) CAPS Take 1 capsule by mouth 2 (two) times daily.     nitroGLYCERIN (NITROSTAT) 0.4 MG SL tablet PLACE 1 TABLET UNDER THE TONGUE EVERY 5 MINUTES AS NEEDED FOR CHEST PAIN. 25 tablet 1   Omega-3 Fatty Acids (OMEGA-3 FISH OIL PO) Take 1,000 mg by mouth every Monday, Tuesday, Wednesday, Thursday, and Friday.     ondansetron (ZOFRAN) 4 MG tablet  Take 4 mg by mouth every 4 (four) hours as needed for vomiting or nausea.     ONETOUCH ULTRA test strip CHECK ONCE DAILY AS DIRECTED     Polyethyl Glycol-Propyl Glycol (SYSTANE OP) Place 1 drop into both eyes in the morning and at bedtime.     saccharomyces boulardii (FLORASTOR) 250 MG capsule Take 250-500 mg by mouth daily in the afternoon.     spironolactone (ALDACTONE) 25 MG tablet Take 1 tablet (25 mg total) by mouth daily. 90 tablet 3   zolpidem (AMBIEN) 5 MG tablet Take 7.5 mg by  mouth at bedtime.     CRANBERRY PO Take 1 tablet by mouth at bedtime.     furosemide (LASIX) 20 MG tablet TAKE 1/2 TABLET BY MOUTH AS NEEDED (Patient taking differently: Take 20 mg by mouth in the morning.) 45 tablet 2   HYDROcodone-acetaminophen (NORCO) 5-325 MG tablet Take 1 tablet by mouth every 6 (six) hours as needed. 10 tablet 0   No current facility-administered medications for this visit.    (Not in a hospital admission)   Family History  Problem Relation Age of Onset   Cancer Mother        cervical   Heart disease Father      Review of Systems:   ROS 14 pt neg except as per above    Physical Exam: BP (!) 172/69 (BP Location: Left Arm, Patient Position: Sitting, Cuff Size: Normal)   Pulse 70   Resp 20   Ht '5\' 3"'$  (1.6 m)   Wt 131 lb 14.4 oz (59.8 kg)   LMP  (LMP Unknown)   SpO2 97% Comment: RA  BMI 23.37 kg/m    NAD Alert Nonlaboured resp Abd soft nt Extr wwp  Diagnostic Studies & Laboratory data:     Recent Radiology Findings:   DG Knee Complete 4 Views Right  Result Date: 10/07/2022 CLINICAL DATA:  Fall EXAM: RIGHT KNEE - COMPLETE 4 VIEW COMPARISON:  12/07/2009 FINDINGS: Status post right knee arthroplasty. No perihardware lucency or fracture. Near anatomic alignment of the hardware components. No acute osseous abnormality. No foreign body. Likely laceration to anterior knee. IMPRESSION: No acute osseous abnormality.  No evidence of hardware complication. Electronically  Signed   By: Merilyn Baba M.D.   On: 10/07/2022 01:41     I have independently reviewed the above radiologic studies and discussed with the patient   Recent Lab Findings: Lab Results  Component Value Date   WBC 6.1 10/06/2022   HGB 11.9 (L) 10/06/2022   HCT 36.6 10/06/2022   PLT 231 10/06/2022   GLUCOSE 109 (H) 10/06/2022   ALT 21 10/06/2022   AST 26 10/06/2022   NA 132 (L) 10/06/2022   K 4.5 10/06/2022   CL 96 (L) 10/06/2022   CREATININE 0.90 10/06/2022   BUN 19 10/06/2022   CO2 26 10/06/2022   INR 1.0 10/06/2022    Echo 07/25/22:  1. Left ventricular ejection fraction, by estimation, is 60 to 65%. The  left ventricle has normal function. The left ventricle has no regional  wall motion abnormalities. There is moderate left ventricular hypertrophy.  Left ventricular diastolic  parameters are consistent with Grade I diastolic dysfunction (impaired  relaxation). Elevated left ventricular end-diastolic pressure.   2. Right ventricular systolic function is normal. The right ventricular  size is normal.   3. Left atrial size was mildly dilated.   4. The mitral valve is degenerative. Mild mitral valve regurgitation. No  evidence of mitral stenosis. Severe mitral annular calcification.   5. Gradients have increased since 08/25/21 mean 23-> 29 peak 43.8 -> 51.3  with DVInow 0.24 and AVA 0.8 cm2 Consistent with severe low gradient AS .  The aortic valve is tricuspid. There is severe calcifcation of the aortic  valve. There is severe  thickening of the aortic valve. Aortic valve regurgitation is not  visualized. Severe aortic valve stenosis.   6. The inferior vena cava is normal in size with greater than 50%  respiratory variability, suggesting right atrial pressure of 3 mmHg.   FINDINGS   Left  Ventricle: Left ventricular ejection fraction, by estimation, is 60  to 65%. The left ventricle has normal function. The left ventricle has no  regional wall motion abnormalities. The left  ventricular internal cavity  size was normal in size. There is   moderate left ventricular hypertrophy. Left ventricular diastolic  parameters are consistent with Grade I diastolic dysfunction (impaired  relaxation). Elevated left ventricular end-diastolic pressure.   Right Ventricle: The right ventricular size is normal. No increase in  right ventricular wall thickness. Right ventricular systolic function is  normal.   Left Atrium: Left atrial size was mildly dilated.   Right Atrium: Right atrial size was normal in size.   Pericardium: There is no evidence of pericardial effusion.   Mitral Valve: The mitral valve is degenerative in appearance. There is  mild thickening of the mitral valve leaflet(s). There is mild  calcification of the mitral valve leaflet(s). Severe mitral annular  calcification. Mild mitral valve regurgitation. No  evidence of mitral valve stenosis.   Tricuspid Valve: The tricuspid valve is normal in structure. Tricuspid  valve regurgitation is not demonstrated. No evidence of tricuspid  stenosis.   Aortic Valve: Gradients have increased since 08/25/21 mean 23-> 29 peak  43.8 -> 51.3 with DVInow 0.24 and AVA 0.8 cm2 Consistent with severe low  gradient AS. The aortic valve is tricuspid. There is severe calcifcation  of the aortic valve. There is severe  thickening of the aortic valve. Aortic valve regurgitation is not  visualized. Severe aortic stenosis is present. Aortic valve mean gradient  measures 29.0 mmHg. Aortic valve peak gradient measures 51.3 mmHg. Aortic  valve area, by VTI measures 0.74 cm.   Pulmonic Valve: The pulmonic valve was normal in structure. Pulmonic valve  regurgitation is mild. No evidence of pulmonic stenosis.   Aorta: The aortic root is normal in size and structure.   Venous: The inferior vena cava is normal in size with greater than 50%  respiratory variability, suggesting right atrial pressure of 3 mmHg.   IAS/Shunts: No  atrial level shunt detected by color flow Doppler.     EXAM: CT ANGIOGRAPHY CHEST, ABDOMEN AND PELVIS   TECHNIQUE: Multidetector CT imaging through the chest, abdomen and pelvis was performed using the standard protocol during bolus administration of intravenous contrast. Multiplanar reconstructed images and MIPs were obtained and reviewed to evaluate the vascular anatomy.   RADIATION DOSE REDUCTION: This exam was performed according to the departmental dose-optimization program which includes automated exposure control, adjustment of the mA and/or kV according to patient size and/or use of iterative reconstruction technique.   CONTRAST:  35m OMNIPAQUE IOHEXOL 350 MG/ML SOLN   COMPARISON:  None Available.   FINDINGS: CTA CHEST FINDINGS   Cardiovascular: Top-normal heart size. Diffuse thickening and coarse calcification of the aortic valve. No significant pericardial effusion/thickening. Atherosclerotic nonaneurysmal thoracic aorta. Normal caliber pulmonary arteries. No central pulmonary emboli.   Mediastinum/Nodes: No significant thyroid nodules. Unremarkable esophagus. Surgical clips in the right axilla. No axillary adenopathy. No pathologically enlarged mediastinal or hilar nodes.   Lungs/Pleura: No pneumothorax. No pleural effusion. Solid 0.3 cm right lower lobe pulmonary nodule (series 5/image 70). Indistinctly marginated solid 0.9 cm posterior left upper lobe pulmonary nodule (series 5/image 35). Otherwise no acute consolidative airspace disease or additional significant pulmonary nodules.   Musculoskeletal: No aggressive appearing focal osseous lesions. Moderate thoracic spondylosis. Status post right mastectomy. Superficial subcutaneous cystic 1.5 cm right axillary lesion (series 4/image 24), most compatible with a benign sebaceous cyst.  CTA ABDOMEN AND PELVIS FINDINGS   Hepatobiliary: Normal liver with no liver mass. Normal gallbladder with no radiopaque  cholelithiasis. No biliary ductal dilatation.   Pancreas: Normal, with no mass or duct dilation.   Spleen: Normal size. No mass.   Adrenals/Urinary Tract: Normal adrenals. Simple 1.5 cm posterior upper left renal cyst and additional subcentimeter hypodense bilateral scattered renal cortical lesions that are too small to characterize, for which no follow-up imaging is recommended. No hydronephrosis. Normal bladder.   Stomach/Bowel: Normal non-distended stomach. Normal caliber small bowel with no small bowel wall thickening. Appendectomy. Marked sigmoid diverticulosis, with no large bowel wall thickening or significant pericolonic fat stranding.   Vascular/Lymphatic: Atherosclerotic nonaneurysmal abdominal aorta. No pathologically enlarged lymph nodes in the abdomen or pelvis.   Reproductive: Status post hysterectomy, with no abnormal findings at the vaginal cuff. No adnexal mass.   Other: No pneumoperitoneum, ascites or focal fluid collection.   Musculoskeletal: No aggressive appearing focal osseous lesions. Bilateral posterior spinal fusion L4-S1. Marked multilevel lumbar degenerative disc disease. Asymmetric advanced right hip osteoarthritis.   VASCULAR MEASUREMENTS PERTINENT TO TAVR:   AORTA:   Minimal Aortic Diameter-9.9 x 7.9 mm   Severity of Aortic Calcification-severe   RIGHT PELVIS:   Right Common Iliac Artery -   Minimal Diameter-7.9 x 6.3 mm   Tortuosity-mild-to-moderate   Calcification-moderate to severe   Right External Iliac Artery -   Minimal Diameter-7.4 x 7.2 mm   Tortuosity-mild-to-moderate   Calcification-none   Right Common Femoral Artery -   Minimal Diameter-6.8 x 6.4 mm   Tortuosity-mild   Calcification-moderate   LEFT PELVIS:   Left Common Iliac Artery -   Minimal Diameter-7.6 x 4.8 mm   Tortuosity-mild   Calcification-severe   Left External Iliac Artery -   Minimal Diameter-7.1 x 6.7 mm   Tortuosity-mild-to-moderate    Calcification-none   Left Common Femoral Artery -   Minimal Diameter-7.4 x 5.6 mm   Tortuosity-mild   Calcification-moderate to severe   Review of the MIP images confirms the above findings.   IMPRESSION: 1. Vascular findings and measurements pertinent to potential TAVR procedure, as detailed. 2. Diffuse thickening and coarse calcification of the aortic valve, compatible with the reported history of aortic valve stenosis. 3. Two solid pulmonary nodules, largest 0.9 cm in the left upper lobe. Recommend attention on follow-up chest CT in 3 months given history of breast cancer and absence of prior imaging. 4. Marked sigmoid diverticulosis. 5. Aortic Atherosclerosis (ICD10-I70.0).      Assessment / Plan:    5F with sympomatic severe aortic stenosis.  Meets criteria for AV replacement. She is low flow/ low gradient. Her STS risk is 5.8% CAD was non-obstructive Dental: no ongoing isues  Bailout: Yes Valve: 35m Edwards S3 Access: Transfemoral NYHA class: II  DJustice RocherMD CV Surgery

## 2022-10-09 MED ORDER — DEXMEDETOMIDINE HCL IN NACL 400 MCG/100ML IV SOLN
0.1000 ug/kg/h | INTRAVENOUS | Status: AC
  Administered 2022-10-10: 1 ug/kg/h via INTRAVENOUS
  Administered 2022-10-10: 60 ug via INTRAVENOUS
  Filled 2022-10-09: qty 100

## 2022-10-09 MED ORDER — NOREPINEPHRINE 4 MG/250ML-% IV SOLN
0.0000 ug/min | INTRAVENOUS | Status: DC
  Filled 2022-10-09: qty 250

## 2022-10-09 MED ORDER — CEFAZOLIN SODIUM-DEXTROSE 2-4 GM/100ML-% IV SOLN
2.0000 g | INTRAVENOUS | Status: AC
  Administered 2022-10-10: 2 g via INTRAVENOUS
  Filled 2022-10-09: qty 100

## 2022-10-09 MED ORDER — MAGNESIUM SULFATE 50 % IJ SOLN
40.0000 meq | INTRAMUSCULAR | Status: DC
  Filled 2022-10-09: qty 9.85

## 2022-10-09 MED ORDER — HEPARIN 30,000 UNITS/1000 ML (OHS) CELLSAVER SOLUTION
Status: DC
  Filled 2022-10-09: qty 1000

## 2022-10-09 MED ORDER — POTASSIUM CHLORIDE 2 MEQ/ML IV SOLN
80.0000 meq | INTRAVENOUS | Status: DC
  Filled 2022-10-09: qty 40

## 2022-10-09 NOTE — Anesthesia Preprocedure Evaluation (Signed)
Anesthesia Evaluation  Patient identified by MRN, date of birth, ID band Patient awake    Reviewed: Allergy & Precautions, NPO status , Patient's Chart, lab work & pertinent test results  Airway Mallampati: II  TM Distance: >3 FB Neck ROM: Full    Dental no notable dental hx.    Pulmonary neg pulmonary ROS, former smoker,    Pulmonary exam normal        Cardiovascular hypertension, Pt. on medications + CAD and + Past MI  + Valvular Problems/Murmurs AS  Rhythm:Regular Rate:Normal + Systolic murmurs ECHO 9735: 1. Left ventricular ejection fraction, by estimation, is 60 to 65%. The  left ventricle has normal function. The left ventricle has no regional  wall motion abnormalities. There is moderate left ventricular hypertrophy.  Left ventricular diastolic  parameters are consistent with Grade I diastolic dysfunction (impaired  relaxation). Elevated left ventricular end-diastolic pressure.  2. Right ventricular systolic function is normal. The right ventricular  size is normal.  3. Left atrial size was mildly dilated.  4. The mitral valve is degenerative. Mild mitral valve regurgitation. No  evidence of mitral stenosis. Severe mitral annular calcification.  5. Gradients have increased since 08/25/21 mean 23-> 29 peak 43.8 -> 51.3  with DVInow 0.24 and AVA 0.8 cm2 Consistent with severe low gradient AS .  The aortic valve is tricuspid. There is severe calcifcation of the aortic  valve. There is severe  thickening of the aortic valve. Aortic valve regurgitation is not  visualized. Severe aortic valve stenosis.  6. The inferior vena cava is normal in size with greater than 50%  respiratory variability, suggesting right atrial pressure of 3 mmHg.    Neuro/Psych Anxiety negative neurological ROS     GI/Hepatic Neg liver ROS, GERD  ,  Endo/Other  diabetes  Renal/GU CRFRenal disease  negative genitourinary    Musculoskeletal  (+) Arthritis , Osteoarthritis,    Abdominal Normal abdominal exam  (+)   Peds  Hematology negative hematology ROS (+)   Anesthesia Other Findings   Reproductive/Obstetrics                            Anesthesia Physical Anesthesia Plan  ASA: 4  Anesthesia Plan: MAC   Post-op Pain Management:    Induction: Intravenous  PONV Risk Score and Plan: 2 and Ondansetron, Propofol infusion and Treatment may vary due to age or medical condition  Airway Management Planned: Mask and Oral ETT  Additional Equipment: Arterial line  Intra-op Plan:   Post-operative Plan:   Informed Consent: I have reviewed the patients History and Physical, chart, labs and discussed the procedure including the risks, benefits and alternatives for the proposed anesthesia with the patient or authorized representative who has indicated his/her understanding and acceptance.     Dental advisory given  Plan Discussed with: CRNA  Anesthesia Plan Comments: (Lab Results      Component                Value               Date                      WBC                      6.1                 10/06/2022  HGB                      11.9 (L)            10/06/2022                HCT                      36.6                10/06/2022                MCV                      95.8                10/06/2022                PLT                      231                 10/06/2022           Lab Results      Component                Value               Date                      NA                       132 (L)             10/06/2022                K                        4.5                 10/06/2022                CO2                      26                  10/06/2022                GLUCOSE                  109 (H)             10/06/2022                BUN                      19                  10/06/2022                CREATININE               0.90                 10/06/2022                CALCIUM  9.6                 10/06/2022                EGFR                     61                  09/22/2022                GFRNONAA                 >60                 10/06/2022          )       Anesthesia Quick Evaluation

## 2022-10-10 ENCOUNTER — Other Ambulatory Visit (HOSPITAL_COMMUNITY): Payer: Medicare Other

## 2022-10-10 ENCOUNTER — Encounter (HOSPITAL_COMMUNITY)
Admission: RE | Disposition: A | Discharge status: Home / Self Care | Payer: Self-pay | Source: Home / Self Care | Attending: Cardiovascular Disease

## 2022-10-10 ENCOUNTER — Inpatient Hospital Stay (HOSPITAL_COMMUNITY): Payer: Medicare Other

## 2022-10-10 ENCOUNTER — Other Ambulatory Visit: Payer: Self-pay | Admitting: Physician Assistant

## 2022-10-10 ENCOUNTER — Encounter (HOSPITAL_COMMUNITY): Payer: Self-pay | Admitting: Cardiovascular Disease

## 2022-10-10 ENCOUNTER — Inpatient Hospital Stay (HOSPITAL_COMMUNITY)
Admission: RE | Admit: 2022-10-10 | Discharge: 2022-10-11 | DRG: 267 | Disposition: A | Discharge status: Home / Self Care | Payer: Medicare Other | Attending: Cardiovascular Disease | Admitting: Cardiovascular Disease

## 2022-10-10 ENCOUNTER — Other Ambulatory Visit: Payer: Self-pay

## 2022-10-10 DIAGNOSIS — K219 Gastro-esophageal reflux disease without esophagitis: Secondary | ICD-10-CM | POA: Diagnosis present

## 2022-10-10 DIAGNOSIS — Z87440 Personal history of urinary (tract) infections: Secondary | ICD-10-CM

## 2022-10-10 DIAGNOSIS — Z91018 Allergy to other foods: Secondary | ICD-10-CM

## 2022-10-10 DIAGNOSIS — Z01818 Encounter for other preprocedural examination: Secondary | ICD-10-CM

## 2022-10-10 DIAGNOSIS — E871 Hypo-osmolality and hyponatremia: Secondary | ICD-10-CM | POA: Diagnosis present

## 2022-10-10 DIAGNOSIS — Z952 Presence of prosthetic heart valve: Secondary | ICD-10-CM | POA: Diagnosis not present

## 2022-10-10 DIAGNOSIS — Z87891 Personal history of nicotine dependence: Secondary | ICD-10-CM

## 2022-10-10 DIAGNOSIS — Z888 Allergy status to other drugs, medicaments and biological substances status: Secondary | ICD-10-CM

## 2022-10-10 DIAGNOSIS — Z8249 Family history of ischemic heart disease and other diseases of the circulatory system: Secondary | ICD-10-CM

## 2022-10-10 DIAGNOSIS — I129 Hypertensive chronic kidney disease with stage 1 through stage 4 chronic kidney disease, or unspecified chronic kidney disease: Secondary | ICD-10-CM | POA: Diagnosis present

## 2022-10-10 DIAGNOSIS — N1831 Chronic kidney disease, stage 3a: Secondary | ICD-10-CM | POA: Diagnosis present

## 2022-10-10 DIAGNOSIS — M1990 Unspecified osteoarthritis, unspecified site: Secondary | ICD-10-CM | POA: Diagnosis present

## 2022-10-10 DIAGNOSIS — I2583 Coronary atherosclerosis due to lipid rich plaque: Secondary | ICD-10-CM | POA: Diagnosis present

## 2022-10-10 DIAGNOSIS — R918 Other nonspecific abnormal finding of lung field: Secondary | ICD-10-CM | POA: Diagnosis present

## 2022-10-10 DIAGNOSIS — I350 Nonrheumatic aortic (valve) stenosis: Secondary | ICD-10-CM | POA: Diagnosis present

## 2022-10-10 DIAGNOSIS — I10 Essential (primary) hypertension: Secondary | ICD-10-CM | POA: Diagnosis not present

## 2022-10-10 DIAGNOSIS — Z853 Personal history of malignant neoplasm of breast: Secondary | ICD-10-CM | POA: Diagnosis not present

## 2022-10-10 DIAGNOSIS — F419 Anxiety disorder, unspecified: Secondary | ICD-10-CM | POA: Diagnosis present

## 2022-10-10 DIAGNOSIS — Z882 Allergy status to sulfonamides status: Secondary | ICD-10-CM

## 2022-10-10 DIAGNOSIS — Z881 Allergy status to other antibiotic agents status: Secondary | ICD-10-CM | POA: Diagnosis not present

## 2022-10-10 DIAGNOSIS — E119 Type 2 diabetes mellitus without complications: Secondary | ICD-10-CM

## 2022-10-10 DIAGNOSIS — I2510 Atherosclerotic heart disease of native coronary artery without angina pectoris: Secondary | ICD-10-CM

## 2022-10-10 DIAGNOSIS — I252 Old myocardial infarction: Secondary | ICD-10-CM | POA: Diagnosis not present

## 2022-10-10 DIAGNOSIS — G4700 Insomnia, unspecified: Secondary | ICD-10-CM | POA: Diagnosis present

## 2022-10-10 DIAGNOSIS — Z006 Encounter for examination for normal comparison and control in clinical research program: Secondary | ICD-10-CM | POA: Diagnosis not present

## 2022-10-10 DIAGNOSIS — Z9011 Acquired absence of right breast and nipple: Secondary | ICD-10-CM

## 2022-10-10 DIAGNOSIS — E785 Hyperlipidemia, unspecified: Secondary | ICD-10-CM | POA: Diagnosis present

## 2022-10-10 DIAGNOSIS — Z91011 Allergy to milk products: Secondary | ICD-10-CM | POA: Diagnosis not present

## 2022-10-10 DIAGNOSIS — I080 Rheumatic disorders of both mitral and aortic valves: Secondary | ICD-10-CM

## 2022-10-10 DIAGNOSIS — E1122 Type 2 diabetes mellitus with diabetic chronic kidney disease: Secondary | ICD-10-CM | POA: Diagnosis present

## 2022-10-10 DIAGNOSIS — Z880 Allergy status to penicillin: Secondary | ICD-10-CM

## 2022-10-10 DIAGNOSIS — M25551 Pain in right hip: Secondary | ICD-10-CM | POA: Diagnosis present

## 2022-10-10 DIAGNOSIS — Z96651 Presence of right artificial knee joint: Secondary | ICD-10-CM | POA: Diagnosis present

## 2022-10-10 DIAGNOSIS — N189 Chronic kidney disease, unspecified: Secondary | ICD-10-CM | POA: Diagnosis not present

## 2022-10-10 DIAGNOSIS — E7800 Pure hypercholesterolemia, unspecified: Secondary | ICD-10-CM | POA: Diagnosis present

## 2022-10-10 DIAGNOSIS — Z885 Allergy status to narcotic agent status: Secondary | ICD-10-CM | POA: Diagnosis not present

## 2022-10-10 DIAGNOSIS — C50919 Malignant neoplasm of unspecified site of unspecified female breast: Secondary | ICD-10-CM | POA: Diagnosis present

## 2022-10-10 HISTORY — DX: Presence of prosthetic heart valve: Z95.2

## 2022-10-10 HISTORY — PX: TRANSCATHETER AORTIC VALVE REPLACEMENT, TRANSFEMORAL: SHX6400

## 2022-10-10 HISTORY — PX: INTRAOPERATIVE TRANSTHORACIC ECHOCARDIOGRAM: SHX6523

## 2022-10-10 HISTORY — PX: ULTRASOUND GUIDANCE FOR VASCULAR ACCESS: SHX6516

## 2022-10-10 LAB — ECHOCARDIOGRAM LIMITED
AR max vel: 3.07 cm2
AV Area VTI: 3.54 cm2
AV Area mean vel: 3.12 cm2
AV Mean grad: 4 mmHg
AV Peak grad: 9.1 mmHg
Ao pk vel: 1.51 m/s
Calc EF: 67.6 %
Single Plane A2C EF: 74.4 %
Single Plane A4C EF: 60.8 %

## 2022-10-10 LAB — POCT I-STAT, CHEM 8
BUN: 17 mg/dL (ref 8–23)
BUN: 15 mg/dL (ref 8–23)
Calcium, Ion: 1.26 mmol/L (ref 1.15–1.40)
Calcium, Ion: 1.29 mmol/L (ref 1.15–1.40)
Chloride: 93 mmol/L — ABNORMAL LOW (ref 98–111)
Chloride: 95 mmol/L — ABNORMAL LOW (ref 98–111)
Creatinine, Ser: 0.7 mg/dL (ref 0.44–1.00)
Creatinine, Ser: 0.7 mg/dL (ref 0.44–1.00)
Glucose, Bld: 124 mg/dL — ABNORMAL HIGH (ref 70–99)
Glucose, Bld: 128 mg/dL — ABNORMAL HIGH (ref 70–99)
HCT: 33 % — ABNORMAL LOW (ref 36.0–46.0)
HCT: 30 % — ABNORMAL LOW (ref 36.0–46.0)
Hemoglobin: 11.2 g/dL — ABNORMAL LOW (ref 12.0–15.0)
Hemoglobin: 10.2 g/dL — ABNORMAL LOW (ref 12.0–15.0)
Potassium: 4.1 mmol/L (ref 3.5–5.1)
Potassium: 4.2 mmol/L (ref 3.5–5.1)
Sodium: 129 mmol/L — ABNORMAL LOW (ref 135–145)
Sodium: 130 mmol/L — ABNORMAL LOW (ref 135–145)
TCO2: 28 mmol/L (ref 22–32)
TCO2: 25 mmol/L (ref 22–32)

## 2022-10-10 LAB — POCT I-STAT 7, (LYTES, BLD GAS, ICA,H+H)
Acid-Base Excess: 2 mmol/L (ref 0.0–2.0)
Bicarbonate: 27.9 mmol/L (ref 20.0–28.0)
Calcium, Ion: 1.29 mmol/L (ref 1.15–1.40)
HCT: 35 % — ABNORMAL LOW (ref 36.0–46.0)
Hemoglobin: 11.9 g/dL — ABNORMAL LOW (ref 12.0–15.0)
O2 Saturation: 100 %
Potassium: 4.2 mmol/L (ref 3.5–5.1)
Sodium: 128 mmol/L — ABNORMAL LOW (ref 135–145)
TCO2: 29 mmol/L (ref 22–32)
pCO2 arterial: 50.4 mmHg — ABNORMAL HIGH (ref 32–48)
pH, Arterial: 7.351 (ref 7.35–7.45)
pO2, Arterial: 187 mmHg — ABNORMAL HIGH (ref 83–108)

## 2022-10-10 LAB — GLUCOSE, CAPILLARY: Glucose-Capillary: 95 mg/dL (ref 70–99)

## 2022-10-10 SURGERY — IMPLANTATION, AORTIC VALVE, TRANSCATHETER, FEMORAL APPROACH
Anesthesia: Monitor Anesthesia Care | Site: Chest

## 2022-10-10 MED ORDER — HYDRALAZINE HCL 25 MG PO TABS: 25.0000 mg | ORAL_TABLET | Freq: Every day | ORAL | Status: DC | PRN

## 2022-10-10 MED ORDER — CHLORHEXIDINE GLUCONATE 4 % EX LIQD: 60.0000 mL | Freq: Once | CUTANEOUS | Status: DC

## 2022-10-10 MED ORDER — LACTATED RINGERS IV SOLN: INTRAVENOUS | Status: DC | PRN

## 2022-10-10 MED ORDER — FENTANYL CITRATE (PF) 250 MCG/5ML IJ SOLN
INTRAMUSCULAR | Status: AC
  Filled 2022-10-10: qty 5

## 2022-10-10 MED ORDER — IODIXANOL 320 MG/ML IV SOLN
INTRAVENOUS | Status: DC | PRN
  Administered 2022-10-10: 40.6 mL

## 2022-10-10 MED ORDER — LIDOCAINE HCL (PF) 1 % IJ SOLN
INTRAMUSCULAR | Status: AC
  Filled 2022-10-10: qty 30

## 2022-10-10 MED ORDER — ACETAMINOPHEN 325 MG PO TABS
650.0000 mg | ORAL_TABLET | Freq: Four times a day (QID) | ORAL | Status: DC | PRN
  Administered 2022-10-10 (×2): 650 mg via ORAL
  Filled 2022-10-10: qty 2

## 2022-10-10 MED ORDER — ISOSORBIDE MONONITRATE ER 30 MG PO TB24
30.0000 mg | ORAL_TABLET | Freq: Every day | ORAL | Status: DC
  Administered 2022-10-10: 30 mg via ORAL
  Filled 2022-10-10: qty 1

## 2022-10-10 MED ORDER — ONDANSETRON HCL 4 MG/2ML IJ SOLN: 4.0000 mg | Freq: Four times a day (QID) | INTRAMUSCULAR | Status: DC | PRN

## 2022-10-10 MED ORDER — PROTAMINE SULFATE 10 MG/ML IV SOLN
INTRAVENOUS | Status: DC | PRN
  Administered 2022-10-10: 90 mg via INTRAVENOUS

## 2022-10-10 MED ORDER — PHENYLEPHRINE HCL (PRESSORS) 10 MG/ML IV SOLN
INTRAVENOUS | Status: DC | PRN
  Administered 2022-10-10 (×2): 40 ug via INTRAVENOUS

## 2022-10-10 MED ORDER — SODIUM CHLORIDE 0.9% FLUSH: 3.0000 mL | INTRAVENOUS | Status: DC | PRN

## 2022-10-10 MED ORDER — NITROGLYCERIN IN D5W 200-5 MCG/ML-% IV SOLN: 0.0000 ug/min | INTRAVENOUS | Status: DC

## 2022-10-10 MED ORDER — CHLORHEXIDINE GLUCONATE 0.12 % MT SOLN: 15.0000 mL | Freq: Once | OROMUCOSAL | Status: AC

## 2022-10-10 MED ORDER — HEPARIN SODIUM (PORCINE) 1000 UNIT/ML IJ SOLN
INTRAMUSCULAR | Status: DC | PRN
  Administered 2022-10-10: 9000 [IU] via INTRAVENOUS

## 2022-10-10 MED ORDER — CLEVIDIPINE BUTYRATE 0.5 MG/ML IV EMUL
INTRAVENOUS | Status: AC
  Filled 2022-10-10: qty 50

## 2022-10-10 MED ORDER — ACETAMINOPHEN 325 MG PO TABS
ORAL_TABLET | ORAL | Status: AC
  Filled 2022-10-10: qty 2

## 2022-10-10 MED ORDER — ACETAMINOPHEN 650 MG RE SUPP
650.0000 mg | Freq: Four times a day (QID) | RECTAL | Status: DC | PRN
  Filled 2022-10-10: qty 1

## 2022-10-10 MED ORDER — VANCOMYCIN HCL IN DEXTROSE 1-5 GM/200ML-% IV SOLN
1000.0000 mg | Freq: Once | INTRAVENOUS | Status: AC
  Administered 2022-10-10: 1000 mg via INTRAVENOUS
  Filled 2022-10-10: qty 200

## 2022-10-10 MED ORDER — CHLORHEXIDINE GLUCONATE 0.12 % MT SOLN
OROMUCOSAL | Status: AC
  Administered 2022-10-10: 15 mL via OROMUCOSAL
  Filled 2022-10-10: qty 15

## 2022-10-10 MED ORDER — GABAPENTIN 300 MG PO CAPS
600.0000 mg | ORAL_CAPSULE | Freq: Every day | ORAL | Status: DC
  Administered 2022-10-10: 600 mg via ORAL
  Filled 2022-10-10: qty 2

## 2022-10-10 MED ORDER — HEPARIN 6000 UNIT IRRIGATION SOLUTION
Status: AC
  Filled 2022-10-10: qty 1500

## 2022-10-10 MED ORDER — LORATADINE 10 MG PO TABS
10.0000 mg | ORAL_TABLET | Freq: Every day | ORAL | Status: DC
  Administered 2022-10-11: 10 mg via ORAL
  Filled 2022-10-10 (×2): qty 1

## 2022-10-10 MED ORDER — SODIUM CHLORIDE 0.9 % IV SOLN: INTRAVENOUS | Status: AC

## 2022-10-10 MED ORDER — SODIUM CHLORIDE 0.9 % IV SOLN: INTRAVENOUS | Status: DC

## 2022-10-10 MED ORDER — OXYCODONE HCL 5 MG PO TABS
5.0000 mg | ORAL_TABLET | ORAL | Status: DC | PRN
  Administered 2022-10-10: 5 mg via ORAL
  Filled 2022-10-10: qty 2

## 2022-10-10 MED ORDER — SPIRONOLACTONE 25 MG PO TABS
25.0000 mg | ORAL_TABLET | Freq: Every day | ORAL | Status: DC
  Administered 2022-10-10 – 2022-10-11 (×2): 25 mg via ORAL
  Filled 2022-10-10 (×2): qty 1

## 2022-10-10 MED ORDER — ZOLPIDEM TARTRATE 5 MG PO TABS
5.0000 mg | ORAL_TABLET | Freq: Every day | ORAL | Status: DC
  Administered 2022-10-10: 5 mg via ORAL
  Filled 2022-10-10: qty 1

## 2022-10-10 MED ORDER — ORAL CARE MOUTH RINSE: 15.0000 mL | OROMUCOSAL | Status: DC | PRN

## 2022-10-10 MED ORDER — SODIUM CHLORIDE 0.9% FLUSH
3.0000 mL | Freq: Two times a day (BID) | INTRAVENOUS | Status: DC
  Administered 2022-10-10: 3 mL via INTRAVENOUS

## 2022-10-10 MED ORDER — ASPIRIN 81 MG PO TBEC
81.0000 mg | DELAYED_RELEASE_TABLET | Freq: Every day | ORAL | Status: DC
  Administered 2022-10-10 – 2022-10-11 (×2): 81 mg via ORAL
  Filled 2022-10-10 (×2): qty 1

## 2022-10-10 MED ORDER — LIDOCAINE HCL 1 % IJ SOLN
INTRAMUSCULAR | Status: DC | PRN
  Administered 2022-10-10: 15 mL

## 2022-10-10 MED ORDER — 0.9 % SODIUM CHLORIDE (POUR BTL) OPTIME
TOPICAL | Status: DC | PRN
  Administered 2022-10-10: 1000 mL

## 2022-10-10 MED ORDER — SODIUM CHLORIDE 0.9 % IV SOLN: 250.0000 mL | INTRAVENOUS | Status: DC | PRN

## 2022-10-10 MED ORDER — CHLORHEXIDINE GLUCONATE 4 % EX LIQD: 30.0000 mL | CUTANEOUS | Status: DC

## 2022-10-10 MED ORDER — HEPARIN 6000 UNIT IRRIGATION SOLUTION
Status: DC | PRN
  Administered 2022-10-10 (×3): 1

## 2022-10-10 SURGICAL SUPPLY — 58 items
BAG COUNTER SPONGE SURGICOUNT (BAG) ×3 IMPLANT
BAG DECANTER FOR FLEXI CONT (MISCELLANEOUS) IMPLANT
BLADE CLIPPER SURG (BLADE) IMPLANT
BLADE OSCILLATING /SAGITTAL (BLADE) IMPLANT
BLADE STERNUM SYSTEM 6 (BLADE) IMPLANT
CABLE ADAPT CONN TEMP 6FT (ADAPTER) ×3 IMPLANT
CABLE ADAPT PACING TEMP 12FT (ADAPTER) IMPLANT
CATH DIAG EXPO 6F AL1 (CATHETERS) IMPLANT
CATH DIAG EXPO 6F VENT PIG 145 (CATHETERS) ×6 IMPLANT
CATH INFINITI 6F AL2 (CATHETERS) IMPLANT
CATH S G BIP PACING (CATHETERS) ×3 IMPLANT
CHLORAPREP W/TINT 26 (MISCELLANEOUS) ×3 IMPLANT
CLOSURE MYNX CONTROL 6F/7F (Vascular Products) IMPLANT
CNTNR URN SCR LID CUP LEK RST (MISCELLANEOUS) ×6 IMPLANT
CONT SPEC 4OZ STRL OR WHT (MISCELLANEOUS) ×6
COVER BACK TABLE 80X110 HD (DRAPES) ×3 IMPLANT
DERMABOND ADVANCED .7 DNX12 (GAUZE/BANDAGES/DRESSINGS) ×3 IMPLANT
DEVICE CLOSURE PERCLS PRGLD 6F (VASCULAR PRODUCTS) ×6 IMPLANT
DRSG TEGADERM 4X4.75 (GAUZE/BANDAGES/DRESSINGS) ×6 IMPLANT
ELECT REM PT RETURN 9FT ADLT (ELECTROSURGICAL) ×3
ELECTRODE REM PT RTRN 9FT ADLT (ELECTROSURGICAL) ×3 IMPLANT
GAUZE SPONGE 4X4 12PLY STRL (GAUZE/BANDAGES/DRESSINGS) ×3 IMPLANT
GLOVE BIO SURGEON STRL SZ7.5 (GLOVE) ×3 IMPLANT
GLOVE ECLIPSE 7.0 STRL STRAW (GLOVE) ×3 IMPLANT
GOWN STRL REUS W/ TWL LRG LVL3 (GOWN DISPOSABLE) IMPLANT
GOWN STRL REUS W/ TWL XL LVL3 (GOWN DISPOSABLE) ×3 IMPLANT
GOWN STRL REUS W/TWL LRG LVL3 (GOWN DISPOSABLE) ×3
GOWN STRL REUS W/TWL XL LVL3 (GOWN DISPOSABLE) ×9
GUIDEWIRE SAF TJ AMPL .035X180 (WIRE) IMPLANT
KIT BASIN OR (CUSTOM PROCEDURE TRAY) ×3 IMPLANT
KIT HEART LEFT (KITS) ×3 IMPLANT
KIT SAPIAN 3 ULTRA RESILIA 23 (Valve) IMPLANT
KIT TURNOVER KIT B (KITS) ×3 IMPLANT
NS IRRIG 1000ML POUR BTL (IV SOLUTION) ×3 IMPLANT
PACK ENDO MINOR (CUSTOM PROCEDURE TRAY) ×3 IMPLANT
PAD ARMBOARD 7.5X6 YLW CONV (MISCELLANEOUS) ×6 IMPLANT
PAD ELECT DEFIB RADIOL ZOLL (MISCELLANEOUS) ×3 IMPLANT
PERCLOSE PROGLIDE 6F (VASCULAR PRODUCTS) ×6
POSITIONER HEAD DONUT 9IN (MISCELLANEOUS) ×3 IMPLANT
SET MICROPUNCTURE 5F STIFF (MISCELLANEOUS) ×3 IMPLANT
SHEATH BRITE TIP 7FR 35CM (SHEATH) ×3 IMPLANT
SHEATH PINNACLE 6F 10CM (SHEATH) ×3 IMPLANT
SHEATH PINNACLE 8F 10CM (SHEATH) ×3 IMPLANT
SLEEVE REPOSITIONING LENGTH 30 (MISCELLANEOUS) ×3 IMPLANT
SPIKE FLUID TRANSFER (MISCELLANEOUS) ×3 IMPLANT
SPONGE T-LAP 18X18 ~~LOC~~+RFID (SPONGE) ×3 IMPLANT
STOPCOCK MORSE 400PSI 3WAY (MISCELLANEOUS) ×6 IMPLANT
SUT PROLENE 6 0 C 1 30 (SUTURE) IMPLANT
SUT SILK  1 MH (SUTURE)
SUT SILK 1 MH (SUTURE) ×3 IMPLANT
SYR 50ML LL SCALE MARK (SYRINGE) ×3 IMPLANT
SYR BULB IRRIG 60ML STRL (SYRINGE) IMPLANT
TOWEL GREEN STERILE (TOWEL DISPOSABLE) ×6 IMPLANT
TRANSDUCER W/STOPCOCK (MISCELLANEOUS) ×6 IMPLANT
WIRE EMERALD 3MM-J .035X150CM (WIRE) ×3 IMPLANT
WIRE EMERALD 3MM-J .035X260CM (WIRE) ×3 IMPLANT
WIRE EMERALD ST .035X260CM (WIRE) ×6 IMPLANT
WIRE SAFARI SM CURVE 275 (WIRE) IMPLANT

## 2022-10-10 NOTE — Anesthesia Procedure Notes (Signed)
Arterial Line Insertion Start/End10/31/2023 6:50 AM, 10/10/2022 6:55 AM Performed by: Darral Dash, DO, Kalif Kattner, Belenda Cruise, CRNA, CRNA  Patient location: Pre-op. Preanesthetic checklist: patient identified, IV checked, site marked, risks and benefits discussed, surgical consent, monitors and equipment checked, pre-op evaluation, timeout performed and anesthesia consent Lidocaine 1% used for infiltration Left, radial was placed Catheter size: 20 G Hand hygiene performed  and maximum sterile barriers used  Allen's test indicative of satisfactory collateral circulation Attempts: 1 Procedure performed without using ultrasound guided technique. Following insertion, dressing applied. Post procedure assessment: normal and unchanged  Patient tolerated the procedure well with no immediate complications.

## 2022-10-10 NOTE — Discharge Summary (Incomplete)
[facility]   MULTIDISCIPLINARY HEART VALVE TEAM  Discharge Summary    Patient ID: Barbara Russell MRN: [id number]; DOB: 03/17/1930  Admit date: 10/10/2022 Discharge date: 10/11/2022  Primary Care Provider: Seward Carol, MD  Primary Cardiologist: Candee Furbish, MD / Dr. Buena Irish & Dr. Tenny Craw (TAVR)  Discharge Diagnoses    Principal Problem:   S/P TAVR (transcatheter aortic valve replacement) Active Problems:   Severe aortic stenosis   Diabetes ([place])   Coronary artery disease due to lipid rich plaque   Hypertension   Hyperlipidemia   Breast cancer ([place])   Benign essential hypertension   Chronic kidney disease, stage 3a (HCC)   Gastroesophageal reflux disease   Pulmonary nodules  Allergies Allergies  Allergen Reactions   Amlodipine Swelling    LEE with '5mg'$  dose  Foot swelling   Benadryl [Diphenhydramine Hcl (Sleep)] Other (See Comments)    Hyperstimulation   Broccoli Flavor [Flavoring Agent] Diarrhea   Brussels Sprouts [Brassica Oleracea] Diarrhea   Cabbage Diarrhea   Ciprofloxacin Hives   Collard Greens [Wild Lettuce Extract (Lactuca Virosa)] Diarrhea   Lactose Intolerance (Gi) Diarrhea   Macrobid [Nitrofurantoin Monohyd Macro] Nausea And Vomiting   Naproxen Other (See Comments)    Stomach pain   Quinine Sulfate [Quinine] Hives   Rosuvastatin     headache   Sulfa Antibiotics Hives        Tramadol Other (See Comments)    unknown to patient   Penicillins Rash    Has patient had a PCN reaction causing immediate rash, facial/tongue/throat swelling, SOB or lightheadedness with hypotension: Yes Has patient had a PCN reaction causing severe rash involving mucus membranes or skin necrosis: No Has patient had a PCN reaction that required hospitalization: No Has patient had a PCN reaction occurring within the last 10 years: No If all of the above answers are "NO", then may proceed with Cephalosporin use. Tolerated Cephalosporin Date: 05/19/22.      Prednisone Other (See Comments)    Weakness and pain in joints    Diagnostic Studies/Procedures    TAVR OPERATIVE NOTE     Date of Procedure:                10/10/2022   Preoperative Diagnosis:      Severe Aortic Stenosis    Postoperative Diagnosis:    Same    Procedure:        Transcatheter Aortic Valve Replacement - Transfemoral Approach             Edwards Sapien 3 THV (size 23 mm, model # [id number], serial # [id number])              Co-Surgeons:                        Lauree Chandler, MD and Justice Rocher, MD   Anesthesiologist:                  Stoltzfus   Echocardiographer:              Gasper Sells   Pre-operative Echo Findings: Severe aortic stenosis Normal left ventricular systolic function   Post-operative Echo Findings: No paravalvular leak Normal left ventricular systolic function _____________    Echo 10/11/22: Completed but pending formal read at the time of discharge   History of Present Illness     Barbara Russell is a 88 y.o. female with a history of anxiety, breast cancer s/p mastectomy, borderline DM, HTN, HLD,  hyponatremia, and severe paradoxical LFLG who presented to [facility] on 10/10/22 for planned TAVR.   She has been followed for moderate aortic stenosis by Dr. Marlou Porch. She recently developed worsening dyspnea and chest pain. Echo 07/25/22 showed LVEF=60-65%, moderate LVH, mild MR and paradoxical LFLG AS with mean gradient 29 mmHg, peak gradient 51.3 mmHg, AVA 0.74 cm2, DI 0.24. [facility] 08/17/22 showed mild non obst CAD and normal heart pressures.   She was evaluated by the multidisciplinary valve team and felt to have severe, symptomatic aortic stenosis and to be a suitable candidate for TAVR, which was set up for 10/10/22.   Hospital Course     Consultants: none   Severe AS: s/p successful TAVR with a 23 mm Edwards Sapien 3 Ultra Resilia  THV via the TF approach on 10/10/22. Post operative echo pending. Groin sites are stable however extra manual  pressure was held in the post procedure setting due to small hematoma which has resolved today. ECG with NSR and no high grade heart block. Continued on Asprin 81 mg daily. Plan for discharge home today with close follow up in the office next week.  HTN: BP well controlled. Resume on home meds including Imdur, spironolactone.   Hyponatremia: Appears chronic. Na 130 today. No neuro changes.   Pulmonary nodules: pre TAVR CT showed two solid pulmonary nodules, largest 0.9 cm in the left upper lobe. Recommend attention on follow-up chest CT in 3 months given history of breast cancer and absence of prior imaging. This will be discussed and arranged in the outpatient setting.   Hip pain: Scheduled for hip arthroplasty in Jan 2024 with Dr. Maureen Ralphs. We will clear her for this at her 1 month appt.  _____________  Discharge Vitals Blood pressure (!) 132/51, pulse 79, temperature 100.1 F (37.8 C), temperature source Oral, resp. rate 18, height '5\' 3"'$  (1.6 m), weight 60.3 kg, SpO2 93 %.  Filed Weights   10/10/22 0611 10/11/22 0656  Weight: 60 kg 60.3 kg   General: Well developed, well nourished, NAD Lungs:Clear to ausculation bilaterally. No wheezes, rales, or rhonchi. Breathing is unlabored. Cardiovascular: RRR with S1 S2. Soft flow murmur MSK: Strength and tone appear normal for age. 5/5 in all extremities Extremities: No edema. Groin site stable with no bleeding or oozing.  Neuro: Alert and oriented. No focal deficits. No facial asymmetry. MAE spontaneously. Psych: Responds to questions appropriately with normal affect.    Labs & Radiologic Studies    CBC Recent Labs    10/10/22 1022 10/11/22 0137  WBC  --  7.3  HGB 10.2* 9.6*  HCT 30.0* 28.0*  MCV  --  92.7  PLT  --  182   Basic Metabolic Panel Recent Labs    10/10/22 1022 10/11/22 0137  NA 130* 130*  K 4.2 4.2  CL 95* 98  CO2  --  24  GLUCOSE 128* 103*  BUN 15 12  CREATININE 0.70 0.78  CALCIUM  --  9.0  MG  --  1.6*    Liver Function Tests No results for input(s): "AST", "ALT", "ALKPHOS", "BILITOT", "PROT", "ALBUMIN" in the last 72 hours. No results for input(s): "LIPASE", "AMYLASE" in the last 72 hours. Cardiac Enzymes No results for input(s): "CKTOTAL", "CKMB", "CKMBINDEX", "TROPONINI" in the last 72 hours. BNP Invalid input(s): "POCBNP" D-Dimer No results for input(s): "DDIMER" in the last 72 hours. Hemoglobin A1C No results for input(s): "HGBA1C" in the last 72 hours. Fasting Lipid Panel No results for input(s): "CHOL", "HDL", "LDLCALC", "TRIG", "CHOLHDL", "  LDLDIRECT" in the last 72 hours. Thyroid Function Tests No results for input(s): "TSH", "T4TOTAL", "T3FREE", "THYROIDAB" in the last 72 hours.  Invalid input(s): "FREET3" _____________  ECHOCARDIOGRAM LIMITED  Result Date: 10/10/2022    ECHOCARDIOGRAM LIMITED REPORT   Patient Name:   CARINE NORDGREN Date of Exam: 10/10/2022 Medical Rec #:  [id number]        Height:       63.0 in Accession #:    [id number]       Weight:       132.3 lb Date of Birth:  1930/12/05        BSA:          1.622 m Patient Age:    45 years         BP:           153/87 mmHg Patient Gender: F                HR:           68 bpm. Exam Location:  Inpatient Procedure: Limited Echo, Color Doppler and Cardiac Doppler Indications:     I35.0 Nonrheumatic aortic (valve) stenosis  History:         Patient has prior history of Echocardiogram examinations, most                  recent 07/25/2022. CAD, Aortic Valve Disease; Risk                  Factors:Hypertension and Diabetes. Breast cancer. Aortic                  stenosis.                  Aortic Valve: 23 mm Sapien prosthetic, stented (TAVR) valve is                  present in the aortic position. Procedure Date: 10/10/2022.  Sonographer:     Roseanna Rainbow RDCS Referring Phys:  [place] Diagnosing Phys: Rudean Haskell MD IMPRESSIONS  1. Interventional TTE for TAVR Placement.  2. Prior to procedure, calcified  tri-leaflet aortic valve. No AI. Low flow low gradient aortic stenosis with mean gradient 21 mm Hg, Peak gradient 34 mm Hg, DVI 0.42.  3. Post procedure, a 23 mm Sapien valve is present. Slightly low deployment without LVOT obstruction. Trivial PVL adjacent to the intraventricular septum. Mean gradient 4 mm Hg, peak gradient 9 mm Hg. EOA 3.44 cm2. Normal DVI.  4. Left ventricular ejection fraction, by estimation, is 60 to 65%. The left ventricle has normal function.  5. The mitral valve is abnormal. Mild mitral valve regurgitation. Moderate mitral annular calcification.  6. Aortic valve regurgitation is not visualized. There is a 23 mm Sapien prosthetic (TAVR) valve present in the aortic position. Procedure Date: 10/10/2022. Comparison(s): Succesful TAVR Placement. FINDINGS  Left Ventricle: Left ventricular ejection fraction, by estimation, is 60 to 65%. The left ventricle has normal function. Pericardium: There is no evidence of pericardial effusion. Mitral Valve: The mitral valve is abnormal. Moderate mitral annular calcification. Mild mitral valve regurgitation. Tricuspid Valve: Tricuspid valve regurgitation is trivial. Aortic Valve: Aortic valve regurgitation is not visualized. Aortic valve mean gradient measures 4.0 mmHg. Aortic valve peak gradient measures 9.1 mmHg. Aortic valve area, by VTI measures 3.54 cm. There is a 23 mm Sapien prosthetic, stented (TAVR) valve present in the aortic position. Procedure Date: 10/10/2022. Aorta: The aortic root and ascending  aorta are structurally normal, with no evidence of dilitation. LEFT VENTRICLE PLAX 2D LVIDd:         4.20 cm LV PW:         1.40 cm LV IVS:        1.30 cm LVOT diam:     2.35 cm LV SV:         124 LV SV Index:   77 LVOT Area:     4.34 cm  LV Volumes (MOD) LV vol d, MOD A2C: 57.8 ml LV vol d, MOD A4C: 42.6 ml LV vol s, MOD A2C: 14.8 ml LV vol s, MOD A4C: 16.7 ml LV SV MOD A2C:     43.0 ml LV SV MOD A4C:     42.6 ml LV SV MOD BP:      33.6 ml AORTIC  VALVE AV Area (Vmax):    3.07 cm AV Area (Vmean):   3.12 cm AV Area (VTI):     3.54 cm AV Vmax:           151.00 cm/s AV Vmean:          94.300 cm/s AV VTI:            0.352 m AV Peak Grad:      9.1 mmHg AV Mean Grad:      4.0 mmHg LVOT Vmax:         107.00 cm/s LVOT Vmean:        67.800 cm/s LVOT VTI:          0.287 m LVOT/AV VTI ratio: 0.82  AORTA Ao Asc diam: 3.20 cm  SHUNTS Systemic VTI:  0.29 m Systemic Diam: 2.35 cm Rudean Haskell MD Electronically signed by Rudean Haskell MD Signature Date/Time: 10/10/2022/11:45:30 AM    Final    Structural Heart Procedure  Result Date: 10/10/2022 See surgical note for result.  DG Chest 2 View  Result Date: 10/09/2022 CLINICAL DATA:  88 year old female with preoperative chest x-ray EXAM: CHEST - 2 VIEW COMPARISON:  12/15/2021 FINDINGS: Cardiomediastinal silhouette unchanged in size and contour. No evidence of central vascular congestion. No interlobular septal thickening. Aortic calcifications. No pneumothorax or pleural effusion. Coarsened interstitial markings, with no confluent airspace disease. No acute displaced fracture. Degenerative changes of the spine. Surgical changes of the right chest wall. Scoliotic curvature again noted. Incompletely imaged lumbar surgery. IMPRESSION: Negative for acute cardiopulmonary disease Electronically Signed   By: Corrie Mckusick D.O.   On: 10/09/2022 08:54   DG Knee Complete 4 Views Right  Result Date: 10/07/2022 CLINICAL DATA:  Fall EXAM: RIGHT KNEE - COMPLETE 4 VIEW COMPARISON:  12/07/2009 FINDINGS: Status post right knee arthroplasty. No perihardware lucency or fracture. Near anatomic alignment of the hardware components. No acute osseous abnormality. No foreign body. Likely laceration to anterior knee. IMPRESSION: No acute osseous abnormality.  No evidence of hardware complication. Electronically Signed   By: Merilyn Baba M.D.   On: 10/07/2022 01:41   MM DIAG BREAST TOMO UNI LEFT  Result Date:  09/21/2022 CLINICAL DATA:  88 year old female with a palpable area of concern in the left breast. EXAM: DIGITAL DIAGNOSTIC UNILATERAL LEFT MAMMOGRAM WITH TOMOSYNTHESIS; ULTRASOUND LEFT BREAST LIMITED TECHNIQUE: Left digital diagnostic mammography and breast tomosynthesis was performed.; Targeted ultrasound examination of the left breast was performed. COMPARISON:  Previous exam(s). ACR Breast Density Category c: The breast tissue is heterogeneously dense, which may obscure small masses. FINDINGS: Spot compression tomograms were performed over the palpable area of concern in the inner  left breast demonstrating an oval mass with obscured margins measuring approximately 1.1 cm. Physical examination at site of palpable concern in the inner left breast reveals a firm slightly mobile mass. Targeted ultrasound of the left breast was performed. There is an oval hypoechoic mass with slight margin irregularity in the left breast at 9 o'clock 3 cm from nipple measuring 1.7 x 0.6 x 1 cm. This corresponds with the mass seen in the inner left breast at mammography. No lymphadenopathy seen in the left axilla. IMPRESSION: Indeterminate 1.7 cm mass in the left breast at the 9 o'clock position. RECOMMENDATION: Recommend ultrasound-guided core biopsy of the palpable mass in the left breast at the 9 o'clock position. The patient has an upcoming appointment with her cardiologist to discuss aortic valve replacement and therefore will call to schedule her breast biopsy following this appointment. I have discussed the findings and recommendations with the patient. If applicable, a reminder letter will be sent to the patient regarding the next appointment. BI-RADS CATEGORY  4: Suspicious. Electronically Signed   By: Everlean Alstrom M.D.   On: 09/21/2022 14:02  US BREAST LTD UNI LEFT INC AXILLA  Result Date: 09/21/2022 CLINICAL DATA:  88 year old female with a palpable area of concern in the left breast. EXAM: DIGITAL DIAGNOSTIC  UNILATERAL LEFT MAMMOGRAM WITH TOMOSYNTHESIS; ULTRASOUND LEFT BREAST LIMITED TECHNIQUE: Left digital diagnostic mammography and breast tomosynthesis was performed.; Targeted ultrasound examination of the left breast was performed. COMPARISON:  Previous exam(s). ACR Breast Density Category c: The breast tissue is heterogeneously dense, which may obscure small masses. FINDINGS: Spot compression tomograms were performed over the palpable area of concern in the inner left breast demonstrating an oval mass with obscured margins measuring approximately 1.1 cm. Physical examination at site of palpable concern in the inner left breast reveals a firm slightly mobile mass. Targeted ultrasound of the left breast was performed. There is an oval hypoechoic mass with slight margin irregularity in the left breast at 9 o'clock 3 cm from nipple measuring 1.7 x 0.6 x 1 cm. This corresponds with the mass seen in the inner left breast at mammography. No lymphadenopathy seen in the left axilla. IMPRESSION: Indeterminate 1.7 cm mass in the left breast at the 9 o'clock position. RECOMMENDATION: Recommend ultrasound-guided core biopsy of the palpable mass in the left breast at the 9 o'clock position. The patient has an upcoming appointment with her cardiologist to discuss aortic valve replacement and therefore will call to schedule her breast biopsy following this appointment. I have discussed the findings and recommendations with the patient. If applicable, a reminder letter will be sent to the patient regarding the next appointment. BI-RADS CATEGORY  4: Suspicious. Electronically Signed   By: Everlean Alstrom M.D.   On: 09/21/2022 14:02  Disposition   Pt is being discharged home today in good condition.  Follow-up Plans & Appointments    Follow-up Information     Jerline Pain, MD. Go on 10/24/2022.   Specialty: Cardiology Why: @ 10am, please arrive at least 10 minutes early. Contact information: [address] [place] [address] [phone]                Discharge Instructions     Call MD for:  difficulty breathing, headache or visual disturbances   Complete by: As directed    Call MD for:  extreme fatigue   Complete by: As directed    Call MD for:  hives   Complete by: As directed  Call MD for:  persistant dizziness or light-headedness   Complete by: As directed    Call MD for:  persistant nausea and vomiting   Complete by: As directed    Call MD for:  redness, tenderness, or signs of infection (pain, swelling, redness, odor or green/yellow discharge around incision site)   Complete by: As directed    Call MD for:  severe uncontrolled pain   Complete by: As directed    Call MD for:  temperature >100.4   Complete by: As directed    Change dressing (specify)   Complete by: As directed    Dressing change: Can remove dressing by tomorrow if irritating to the skin and replace with Bandaid or leave open to air.   Diet - low sodium heart healthy   Complete by: As directed    Discharge instructions   Complete by: As directed    ACTIVITY AND EXERCISE  Daily activity and exercise are an important part of your recovery. People recover at different rates depending on their general health and type of valve procedure.  Most people recovering from TAVR feel better relatively quickly   No lifting, pushing, pulling more than 10 pounds (examples to avoid: groceries, vacuuming, gardening, golfing):             - For one week with a procedure through the groin.             - For six weeks for procedures through the chest wall or neck. NOTE: You will typically see one of our providers 7-14 days after your procedure to discuss [place] the above activities.      DRIVING  Do not drive until you are seen for follow up and cleared by a provider. Generally, we ask patient to not drive for 1 week after their procedure.  If you have been told by your doctor in the  past that you may not drive, you must talk with him/her before you begin driving again.   DRESSING  Groin site: you may leave the clear dressing over the site for up to one week or until it falls off.   HYGIENE  If you had a femoral (leg) procedure, you may take a shower when you return home. After the shower, pat the site dry. Do NOT use powder, oils or lotions in your groin area until the site has completely healed.  If you had a chest procedure, you may shower when you return home unless specifically instructed not to by your discharging practitioner.             - DO NOT scrub incision; pat dry with a towel.             - DO NOT apply any lotions, oils, powders to the incision.             - No tub baths / swimming for at least 2 weeks.  If you notice any fevers, chills, increased pain, swelling, bleeding or pus, please contact your doctor.   ADDITIONAL INFORMATION  If you are going to have an upcoming dental procedure, please contact our office as you will require antibiotics ahead of time to prevent infection on your heart valve.    If you have any questions or concerns you can call the structural heart phone during normal business hours 8am-4pm. If you have an urgent need after hours or weekends please call [phone] to talk to the on call provider for general cardiology. If  you have an emergency that requires immediate attention, please call 911.    After TAVR Checklist  Check  Test Description  Follow up appointment in 1-2 weeks  You will see our structural heart advanced practice provider. Your incision sites will be checked and you will be cleared to drive and resume all normal activities if you are doing well.    1 month echo and follow up  You will have an echo to check on your new heart valve and be seen back in the office by a structural heart advanced practice provider.  Follow up with your primary cardiologist You will need to be seen by your primary cardiologist in  the following 3-6 months after your 1 month appointment in the valve clinic.   1 year echo and follow up You will have another echo to check on your heart valve after 1 year and be seen back in the office by a structural heart advanced practice provider. This your last structural heart visit.  Bacterial endocarditis prophylaxis  You will have to take antibiotics for the rest of your life before all dental procedures (even teeth cleanings) to protect your heart valve. Antibiotics are also required before some surgeries. Please check with your cardiologist before scheduling any surgeries. Also, please make sure to tell us if you have a penicillin allergy as you will require an alternative antibiotic.   Increase activity slowly   Complete by: As directed       Discharge Medications   Allergies as of 10/11/2022       Reactions   Amlodipine Swelling   LEE with '5mg'$  dose Foot swelling   Benadryl [diphenhydramine Hcl (sleep)] Other (See Comments)   Hyperstimulation   Broccoli Flavor [flavoring Agent] Diarrhea   Brussels Sprouts [brassica Oleracea] Diarrhea   Cabbage Diarrhea   Ciprofloxacin Hives   Collard Greens [wild Lettuce Extract (lactuca Virosa)] Diarrhea   Lactose Intolerance (gi) Diarrhea   Macrobid [nitrofurantoin Monohyd Macro] Nausea And Vomiting   Naproxen Other (See Comments)   Stomach pain   Quinine Sulfate [quinine] Hives   Rosuvastatin    headache   Sulfa Antibiotics Hives      Tramadol Other (See Comments)   unknown to patient   Penicillins Rash   Has patient had a PCN reaction causing immediate rash, facial/tongue/throat swelling, SOB or lightheadedness with hypotension: Yes Has patient had a PCN reaction causing severe rash involving mucus membranes or skin necrosis: No Has patient had a PCN reaction that required hospitalization: No Has patient had a PCN reaction occurring within the last 10 years: No If all of the above answers are "NO", then may proceed with  Cephalosporin use. Tolerated Cephalosporin Date: 05/19/22.   Prednisone Other (See Comments)   Weakness and pain in joints        Medication List     TAKE these medications    acetaminophen 325 MG tablet Commonly known as: TYLENOL Take 650 mg by mouth every 6 (six) hours as needed for moderate pain or mild pain.   aspirin 81 MG tablet Take 1 tablet (81 mg total) by mouth daily.   b complex vitamins capsule Take 1 capsule by mouth in the morning.   cetirizine 10 MG tablet Commonly known as: ZYRTEC Take 10 mg by mouth daily as needed for allergies or rhinitis.   CRANBERRY PO Take 1 tablet by mouth at bedtime.   furosemide 20 MG tablet Commonly known as: LASIX TAKE 1/2 TABLET BY MOUTH AS NEEDED What  changed: See the new instructions.   gabapentin 300 MG capsule Commonly known as: NEURONTIN Take 600 mg by mouth at bedtime.   hydrALAZINE 25 MG tablet Commonly known as: APRESOLINE Take 1 tablet (25 mg total) by mouth daily as needed (blood pressure of 160 or above).   HYDROcodone-acetaminophen 5-325 MG tablet Commonly known as: Norco Take 1 tablet by mouth every 6 (six) hours as needed.   isosorbide mononitrate 30 MG 24 hr tablet Commonly known as: IMDUR TAKE 1 TABLET BY MOUTH EVERY DAY What changed: when to take this   losartan 50 MG tablet Commonly known as: COZAAR Take 1 tablet (50 mg total) by mouth 2 (two) times daily. What changed:  when to take this additional instructions   mineral oil-hydrophilic petrolatum ointment Apply 1 Application topically daily as needed for irritation.   nitroGLYCERIN 0.4 MG SL tablet Commonly known as: NITROSTAT PLACE 1 TABLET UNDER THE TONGUE EVERY 5 MINUTES AS NEEDED FOR CHEST PAIN.   OMEGA-3 FISH OIL PO Take 1,000 mg by mouth every Monday, Tuesday, Wednesday, Thursday, and Friday.   ondansetron 4 MG tablet Commonly known as: ZOFRAN Take 4 mg by mouth every 4 (four) hours as needed for vomiting or nausea.    OneTouch Ultra test strip Generic drug: glucose blood CHECK ONCE DAILY AS DIRECTED   PreserVision AREDS 2 Caps Take 1 capsule by mouth 2 (two) times daily.   saccharomyces boulardii 250 MG capsule Commonly known as: FLORASTOR Take 250-500 mg by mouth daily in the afternoon.   Salonpas 3.12-16-08 % Ptch Generic drug: Camphor-Menthol-Methyl Sal Place 1 patch onto the skin daily as needed (pain).   spironolactone 25 MG tablet Commonly known as: ALDACTONE Take 1 tablet (25 mg total) by mouth daily.   SYSTANE OP Place 1 drop into both eyes in the morning and at bedtime.   VITAMIN D3 PO Take 1,000 Units by mouth in the morning.   zolpidem 5 MG tablet Commonly known as: AMBIEN Take 7.5 mg by mouth at bedtime.               Discharge Care Instructions  (From admission, onward)           Start     Ordered   10/11/22 0000  Change dressing (specify)       Comments: Dressing change: Can remove dressing by tomorrow if irritating to the skin and replace with Bandaid or leave open to air.   10/11/22 0904            Outstanding Labs/Studies   None   Duration of Discharge Encounter   Greater than 30 minutes including physician time.  SignedKathyrn Drown, NP 10/11/2022, 9:04 AM [phone]  I have personally seen and examined this patient. I agree with the assessment and plan as outlined above. She is doing well post TAVR. Groins stable. BP is stable. Discharge home today.   Lauree Chandler, MD, [facility] 10/11/2022 9:17 AM

## 2022-10-10 NOTE — Progress Notes (Signed)
Left groin hematoma . Pressure held and cath lab called. Cath lab RN at bedside   10/10/22 1306  Charting Type  Charting Type Shift Assessment  Focused Reassessment No Changes Vascular  [place] Work Intensity Score (Update with each assessment and as needed)  Work Intensity Score (Level) 3  Level 3 Intensity C.High risk for fall/injury  NOT following commands;B.Frequent assistance requests (patient or family)  Neurological  Neuro (WDL) WDL  Orientation Level Oriented X4  Cognition Appropriate at baseline  Speech Clear  NuDESC - Delirium Risk Factor Assessment (Complete for non-ICU patients)  Delirium Risk Factor Assessment Surgery this admission  NuDESC - Nursing Delirium Screening Scale (Complete for non-ICU patients)  Disorientation 0  Inappropriate Behavior 0  Inappropriate Communications 0  Illusions/hallucinations 0  Psychomotor Retardation 0  NuDESC Total Score 0  NuDESC - Delirium Prevention:  Universal Requirements (Complete for all non-ICU patients with a delirium risk factor)  Universal Precautions Initiated *See Row Information* Yes  Respiratory  Respiratory (WDL) WDL  Respiratory Pattern Regular  Chest Assessment Chest expansion symmetrical  Bilateral Breath Sounds Clear  Cardiac  Cardiac (WDL) WDL  Heart Sounds S1, S2  ECG Monitoring  Cardiac Rhythm NSR  First Vascular Site Assessment  #1 - Location of Site Assessment Right femoral  #1 - Vascular Site Assessment Scale Level 0  #1 - Hematoma present? No  #1 - Dressing Status Clean, Dry, Intact  Second Vascular Site Assessment  #2 - Location of Site Assessment Left femoral  #2 - Vascular Site Assessment Scale Level 1  #2 - Hematoma present? Yes  #2 - Dressing Status Old drainage (marked)  #3 - Location of Patient  Nursing Unit  #2 - Hematoma size (cm) 4 cm  #2 - Hematoma borders marked? No  #2 - Patient Activity Prior to Hematoma none  #2 - Pressure Hold Time 1306  #2 - Bedrest Start Time  1400  #2 -  Hematoma delaying discharge? No  Neurological  Level of Consciousness Alert

## 2022-10-10 NOTE — Progress Notes (Signed)
Called to check left groin hematoma. Manual pressure was held for 10 minutes before my arrival. I held and additional 10 minutes for a total of 20.    Site level is 1 slight bruising present. No palpable hematoma. Left dp pulse easily palpable.

## 2022-10-10 NOTE — Progress Notes (Signed)
Patient from Cath lab. CHG given, Pt oriented to unit. Bilateral groin level 0     10/10/22 1306  Vitals  Temp 97.8 F (36.6 C)  Temp Source Axillary  BP (!) 152/69  MAP (mmHg) 94  BP Location Left Arm  BP Method Automatic  Patient Position (if appropriate) Lying  Pulse Rate 63  Pulse Rate Source Monitor  Resp 18  Level of Consciousness  Level of Consciousness Alert  MEWS COLOR  MEWS Score Color Green  Oxygen Therapy  SpO2 100 %  O2 Device Room Air  Pain Assessment  Pain Scale 0-10  Pain Score 6  MEWS Score  MEWS Temp 0  MEWS Systolic 0  MEWS Pulse 0  MEWS RR 0  MEWS LOC 0  MEWS Score 0

## 2022-10-10 NOTE — CV Procedure (Signed)
[facility]  TAVR OPERATIVE NOTE   Date of Procedure:  10/10/2022  Preoperative Diagnosis: Severe Aortic Stenosis   Postoperative Diagnosis: Same   Procedure:   Transcatheter Aortic Valve Replacement - Transfemoral Approach  Edwards Sapien 3 THV (size 23 mm, model # [id number], serial # [id number])   Co-Surgeons:  Lauree Chandler, MD and Justice Rocher, MD  Anesthesiologist:  Stoltzfus  Echocardiographer:  Gasper Sells  Pre-operative Echo Findings: Severe aortic stenosis Normal left ventricular systolic function  Post-operative Echo Findings: No paravalvular leak Normal left ventricular systolic function  BRIEF CLINICAL NOTE AND INDICATIONS FOR SURGERY  88 yo female with history of anxiety, breast cancer s/p mastectomy, CKD, borderline diabetes, HTN, hyperlipidemia and severe aortic stenosis who is here today for TAVR. She has been followed for moderate aortic stenosis by Dr. Marlou Porch. Recent worsened dyspnea and chest pain. Echo 07/25/22 with LVEF=60-65%. Moderate LVH. Mild MR. The aortic valve is thickened and calcified with mean gradient 29 mmHg, peak gradient 51.3 mmHg, AVA 0.74 cm2, DI 0.24. This is consistent with paradoxical low flow/low gradient aortic stenosis. She has had progressive dyspnea and fatigue.    During the course of the patient's preoperative work up they have been evaluated comprehensively by a multidisciplinary team of specialists coordinated through the [facility] in the [facility].  They have been demonstrated to suffer from symptomatic severe aortic stenosis as noted above. The patient has been counseled extensively as to the relative risks and benefits of all options for the treatment of severe aortic stenosis including long term medical therapy, conventional surgery for aortic valve replacement, and transcatheter aortic valve replacement.  The patient has been independently evaluated by  Dr. Tenny Craw with CT surgery and they are felt to be at high risk for conventional surgical aortic valve replacement. The surgeon indicated the patient would be a poor candidate for conventional surgery. Based upon review of all of the patient's preoperative diagnostic tests they are felt to be candidate for transcatheter aortic valve replacement using the transfemoral approach as an alternative to high risk conventional surgery.    Following the decision to proceed with transcatheter aortic valve replacement, a discussion has been held regarding what types of management strategies would be attempted intraoperatively in the event of life-threatening complications, including whether or not the patient would be considered a candidate for the use of cardiopulmonary bypass and/or conversion to open sternotomy for attempted surgical intervention.  The patient has been advised of a variety of complications that might develop peculiar to this approach including but not limited to risks of death, stroke, paravalvular leak, aortic dissection or other major vascular complications, aortic annulus rupture, device embolization, cardiac rupture or perforation, acute myocardial infarction, arrhythmia, heart block or bradycardia requiring permanent pacemaker placement, congestive heart failure, respiratory failure, renal failure, pneumonia, infection, other late complications related to structural valve deterioration or migration, or other complications that might ultimately cause a temporary or permanent loss of functional independence or other long term morbidity.  The patient provides full informed consent for the procedure as described and all questions were answered preoperatively.  DETAILS OF THE OPERATIVE PROCEDURE  PREPARATION:   The patient is brought to the operating room on the above mentioned date and central monitoring was established by the anesthesia team including placement of a radial arterial line. The patient  is placed in the supine position on the operating table.  Intravenous antibiotics are administered. Conscious sedation is used.   Baseline transthoracic  echocardiogram was performed. The patient's chest, abdomen, both groins, and both lower extremities are prepared and draped in a sterile manner. A time out procedure is performed.   PERIPHERAL ACCESS:   Using the modified Seldinger technique, femoral arterial and venous access were obtained with placement of a 6 Fr sheath in the artery and a 7 Fr sheath in the vein on the left side using u/s guidance.  A pigtail diagnostic catheter was passed through the femoral arterial sheath under fluoroscopic guidance into the aortic root.  A temporary transvenous pacemaker catheter was passed through the femoral venous sheath under fluoroscopic guidance into the right ventricle.  The pacemaker was tested to ensure stable lead placement and pacemaker capture. Aortic root angiography was performed in order to determine the optimal angiographic angle for valve deployment.  TRANSFEMORAL ACCESS:  A micropuncture kit was used to gain access to the right femoral artery using u/s guidance. Position confirmed with angiography. Pre-closure with double ProGlide closure devices. The patient was heparinized systemically and ACT verified > 250 seconds.    A 14 Fr transfemoral E-sheath was introduced into the right femoral artery after progressively dilating over an Amplatz superstiff wire. An AL-1 catheter was used to direct a straight-tip exchange length wire across the native aortic valve into the left ventricle. This was exchanged out for a pigtail catheter and position was confirmed in the LV apex. Simultaneous LV and Ao pressures were recorded.  The pigtail catheter was then exchanged for an Amplatz Extra-stiff wire in the LV apex.   TRANSCATHETER HEART VALVE DEPLOYMENT:  An Edwards Sapien 3 THV (size 23 mm) was prepared and crimped per manufacturer's guidelines, and the  proper orientation of the valve is confirmed on the [employer] delivery system. The valve was advanced through the introducer sheath using normal technique until in an appropriate position in the abdominal aorta beyond the sheath tip. The balloon was then retracted and using the fine-tuning wheel was centered on the valve. The valve was then advanced across the aortic arch using appropriate flexion of the catheter. The valve was carefully positioned across the aortic valve annulus. The Commander catheter was retracted using normal technique. Once final position of the valve has been confirmed by angiographic assessment, the valve is deployed while temporarily holding ventilation and during rapid ventricular pacing to maintain systolic blood pressure < 50 mmHg and pulse pressure < 10 mmHg. The balloon inflation is held for >3 seconds after reaching full deployment volume. Once the balloon has fully deflated the balloon is retracted into the ascending aorta and valve function is assessed using TTE. There is felt to be no paravalvular leak and no central aortic insufficiency.  The patient's hemodynamic recovery following valve deployment is good.  The deployment balloon and guidewire are both removed. Echo demostrated acceptable post-procedural gradients, stable mitral valve function, and no AI.   PROCEDURE COMPLETION:  The sheath was then removed and closure devices were completed. Protamine was administered once femoral arterial repair was complete. The temporary pacemaker, pigtail catheters and femoral sheaths were removed with a Mynx closure device placed in the artery and manual pressure used for venous hemostasis.    The patient tolerated the procedure well and is transported to the surgical intensive care in stable condition. There were no immediate intraoperative complications. All sponge instrument and needle counts are verified correct at completion of the operation.   No blood products were  administered during the operation.  The patient received a total of 40 mL of  intravenous contrast during the procedure.  LVEDP: 20 mmHg  Lauree Chandler MD, [facility] 10/10/2022 9:48 AM

## 2022-10-10 NOTE — Transfer of Care (Signed)
Immediate Anesthesia Transfer of Care Note  Patient: Barbara Russell  Procedure(s) Performed: Transcatheter Aortic Valve Replacement, Transfemoral (Chest) INTRAOPERATIVE TRANSTHORACIC ECHOCARDIOGRAM ULTRASOUND GUIDANCE FOR VASCULAR ACCESS  Patient Location: Cath Lab  Anesthesia Type:MAC  Level of Consciousness: awake, alert , and oriented  Airway & Oxygen Therapy: Patient Spontanous Breathing and Patient connected to nasal cannula oxygen  Post-op Assessment: Report given to RN, Post -op Vital signs reviewed and stable, and Patient moving all extremities  Post vital signs: Reviewed and stable  Last Vitals:  Vitals Value Taken Time  BP 116/38 10/10/22 1115  Temp 36.8 C 10/10/22 1115  Pulse 52 10/10/22 1115  Resp 18 10/10/22 1115  SpO2 96 % 10/10/22 0951  Vitals shown include unvalidated device data.  Last Pain:  Vitals:   10/10/22 1115  TempSrc: Temporal  PainSc: 2       Patients Stated Pain Goal: 0 (75/43/60 6770)  Complications: No notable events documented.

## 2022-10-10 NOTE — Progress Notes (Signed)
  [facility] VALVE TEAM  Patient doing well s/p TAVR. She is hemodynamically stable. Groin sites stable. Complaining of bilateral upper thigh pain that feels musculoskeletal. ECG with no high grade block. Plan to DC arterial line and transfer to 4E.  Early ambulation after bedrest completed and hopeful discharge over the next 24-48 hours.   Angelena Form PA-C  [facility]  Pager [phone]

## 2022-10-10 NOTE — Discharge Instructions (Signed)

## 2022-10-10 NOTE — Op Note (Signed)
Operative Note: Patient Name: Barbara Russell Date of Birth: April 05, 1930  Operation: 19m Sapien S3, Transfemoral  Surgeon: DPierre BaliEnter  Co-Surgeon: MBurnell Blanks MD   Findings: Good implant depth No significant AR  Description of Procedure: The patient was brought in the operating room and laid in supine position.  The patient was prepped and draped in standard fashion.  An arterial and venous lines were placed by anesthesia along with sedation. Left femoral ultrasound access was gained and small Fr sheaths placed and a Swan- Ganz placed to the right heart along with a pigtail up to the aortic root under flouroscopy. Right femoral ultrasound guided access was gained and dilated up to a Edwards large bore sheath. Heparin was given. The valve had been crossed with a pigtail at this point. The 23S Edwards valve was advanced into the descending aorta, 50% flex, followed by over the arch and through the valve. Pigtail was re-adjusted. TAVR was deployed during pacing and found to be around 90/10 implant. Good position by echo without significant AR. TAVR removed at 0% flex, and two pre-closed devices were used to close right groin. First one did not take, second one worked with small residual bleeding. Protamine given. Manual pressure held. See Dr. MCamillia Herterdictation for treatment of left sided sheaths. Patient in good condition at the conclusion of the procedure.   DJustice RocherMD  CV Surgery

## 2022-10-10 NOTE — H&P (Signed)
[address]       [place] [address]             [phone]                                        Barbara Russell [facility] Medical Record #[id number] Date of Birth: 09-Apr-1930   Referring: Jerline Pain, MD Primary Care: Seward Carol, MD Primary Cardiologist:Mark Marlou Porch, MD   Chief Complaint:        Chief Complaint  Patient presents with   Aortic Stenosis      Surgical consult for TAVR, review all testing      History of Present Illness:     :88 yo female with history of anxiety, breast cancer s/p mastectomy, CKD, borderline diabetes, HTN, hyperlipidemia and severe aortic stenosis who is here today for TAVR evaluation.She had progressive SOB and lives in a senior care facility. No active dental issues.     The aortic valve is thickened and calcified with mean gradient 29 mmHg, peak gradient 51.3 mmHg, AVA 0.74 cm2, DI 0.24. This is consistent with paradoxical low flow/low gradient aortic stenosis.          Past Medical History:  Diagnosis Date   Anxiety     Breast cancer ([place])      Hx of BC, s/p mastectomy on right 1999, s/p tamoxifen X1 year   Chest pain      EC Stress test 12/21/11 Low risk,  no ischemia, normal EF   Chronic kidney disease     Coronary artery disease      Remote Hx of CAD, normal stress test 2009   Diabetes mellitus without complication (HCC)     Diverticulitis     GERD (gastroesophageal reflux disease)      No longer a problem   Hypercholesteremia     Hypertension     Insomnia     Lichen sclerosus      Low back pain     Myocardial infarction ([place]) 12/11/1998   Osteoarthrosis, unspecified whether generalized or localized, unspecified site     Pain      Several year Hx of pain/ SOB radiating to her neck   Severe aortic stenosis     UTI (urinary tract infection)             Past Surgical History:  Procedure Laterality Date   ABDOMINAL HYSTERECTOMY       APPENDECTOMY       HAND SURGERY Bilateral      CTS and trigger finger on right and left   MASTECTOMY Right 12/11/1997   REPLACEMENT TOTAL KNEE Right 11/10/2009   RIGHT HEART CATH AND CORONARY ANGIOGRAPHY N/A 08/17/2022    Procedure: RIGHT HEART CATH AND CORONARY ANGIOGRAPHY;  Surgeon: Burnell Blanks, MD;  Location: [facility];  Service: Cardiovascular;  Laterality: N/A;   TOTAL KNEE REVISION Right 05/17/2022    Procedure: Right knee polyethylene revision;  Surgeon: Gaynelle Arabian, MD;  Location: [facility];  Service: Orthopedics;  Laterality: Right;   VESICOVAGINAL FISTULA CLOSURE W/ TAH   12/12/1967      Social History        Tobacco Use  Smoking Status Former   Packs/day: 1.00   Types: Cigarettes   Quit date: 07/08/1997   Years since quitting: 25.2  Smokeless Tobacco Never    Social History       Substance and Sexual Activity  Alcohol Use No             Allergies  Allergen Reactions   Amlodipine Swelling      LEE with '5mg'$  dose   Foot swelling   Benadryl [Diphenhydramine Hcl (Sleep)] Other (See Comments)      Hyperstimulation   Broccoli Flavor [Flavoring Agent] Diarrhea   Brussels Sprouts [Brassica Oleracea] Diarrhea   Cabbage Diarrhea   Ciprofloxacin Hives   Collard Greens [Wild Lettuce Extract (Lactuca Virosa)] Diarrhea   Lactose Intolerance (Gi) Diarrhea   Macrobid [Nitrofurantoin Monohyd Macro] Nausea And Vomiting   Naproxen Other (See Comments)      Stomach pain   Quinine Sulfate [Quinine] Hives   Rosuvastatin        headache   Sulfa Antibiotics Hives            Tramadol Other  (See Comments)      unknown to patient   Penicillins Rash      Has patient had a PCN reaction causing immediate rash, facial/tongue/throat swelling, SOB or lightheadedness with hypotension: Yes Has patient had a PCN reaction causing severe rash involving mucus membranes or skin necrosis: No Has patient had a PCN reaction that required hospitalization: No Has patient had a PCN reaction occurring within the last 10 years: No If all of the above answers are "NO", then may proceed with Cephalosporin use. Tolerated Cephalosporin Date: 05/19/22.       Prednisone Other (See Comments)      Weakness and pain in joints  Current Outpatient Medications  Medication Sig Dispense Refill   acetaminophen (TYLENOL) 325 MG tablet Take 650 mg by mouth every 6 (six) hours as needed for moderate pain or mild pain.       aspirin 81 MG tablet Take 1 tablet (81 mg total) by mouth daily. 30 tablet 0   b complex vitamins capsule Take 1 capsule by mouth in the morning.       Camphor-Menthol-Methyl Sal (SALONPAS) 3.12-16-08 % PTCH Place 1 patch onto the skin daily as needed (pain).       cetirizine (ZYRTEC) 10 MG tablet Take 10 mg by mouth daily as needed for allergies or rhinitis.       Cholecalciferol (VITAMIN D3 PO) Take 1,000 Units by mouth in the morning.       gabapentin (NEURONTIN) 300 MG capsule Take 600 mg by mouth at bedtime.        hydrALAZINE (APRESOLINE) 25 MG tablet Take 1 tablet (25 mg total) by mouth daily as needed (blood pressure of 160 or above). (Patient not taking: Reported on 10/04/2022) 90 tablet 3   isosorbide mononitrate (IMDUR) 30 MG 24 hr tablet TAKE 1 TABLET BY MOUTH EVERY DAY (Patient taking differently: Take 30 mg by mouth daily at 4 PM.) 90 tablet 3   losartan (COZAAR) 50 MG tablet Take 1 tablet (50 mg total) by mouth 2 (two) times daily. (Patient taking differently: Take 50 mg by mouth See admin instructions. Take 1 tablet (50 mg) by mouth scheduled every morning, may repeat  dose in the afternoon if blood pressure is elevated.) 180 tablet 3   mineral oil-hydrophilic petrolatum (AQUAPHOR) ointment Apply 1 Application topically daily as needed for irritation.       Multiple Vitamins-Minerals (PRESERVISION AREDS 2) CAPS Take 1 capsule by mouth 2 (two) times daily.       nitroGLYCERIN (NITROSTAT) 0.4 MG SL tablet PLACE 1 TABLET UNDER THE TONGUE EVERY 5 MINUTES AS NEEDED FOR CHEST PAIN. 25 tablet 1   Omega-3 Fatty Acids (OMEGA-3 FISH OIL PO) Take 1,000 mg by mouth every Monday, Tuesday, Wednesday, Thursday, and Friday.       ondansetron (ZOFRAN) 4 MG tablet Take 4 mg by mouth every 4 (four) hours as needed for vomiting or nausea.       ONETOUCH ULTRA test strip CHECK ONCE DAILY AS DIRECTED       Polyethyl Glycol-Propyl Glycol (SYSTANE OP) Place 1 drop into both eyes in the morning and at bedtime.       saccharomyces boulardii (FLORASTOR) 250 MG capsule Take 250-500 mg by mouth daily in the afternoon.       spironolactone (ALDACTONE) 25 MG tablet Take 1 tablet (25 mg total) by mouth daily. 90 tablet 3   zolpidem (AMBIEN) 5 MG tablet Take 7.5 mg by mouth at bedtime.       CRANBERRY PO Take 1 tablet by mouth at bedtime.       furosemide (LASIX) 20 MG tablet TAKE 1/2 TABLET BY MOUTH AS NEEDED (Patient taking differently: Take 20 mg by mouth in the morning.) 45 tablet 2   HYDROcodone-acetaminophen (NORCO) 5-325 MG tablet Take 1 tablet by mouth every 6 (six) hours as needed. 10 tablet 0    No current facility-administered medications for this visit.      (Not in a hospital admission)          Family History  Problem Relation Age of Onset   Cancer Mother  cervical   Heart disease Father          Review of Systems:    ROS 14 pt neg except as per above                Physical Exam: BP (!) 172/69 (BP Location: Left Arm, Patient Position: Sitting, Cuff Size: Normal)   Pulse 70   Resp 20   Ht '5\' 3"'$  (1.6 m)   Wt 131 lb 14.4 oz (59.8 kg)   LMP  (LMP  Unknown)   SpO2 97% Comment: RA  BMI 23.37 kg/m      NAD Alert Nonlaboured resp Abd soft nt Extr wwp   Diagnostic Studies & Laboratory data:     Recent Radiology Findings:   DG Knee Complete 4 Views Right   Result Date: 10/07/2022 CLINICAL DATA:  Fall EXAM: RIGHT KNEE - COMPLETE 4 VIEW COMPARISON:  12/07/2009 FINDINGS: Status post right knee arthroplasty. No perihardware lucency or fracture. Near anatomic alignment of the hardware components. No acute osseous abnormality. No foreign body. Likely laceration to anterior knee. IMPRESSION: No acute osseous abnormality.  No evidence of hardware complication. Electronically Signed   By: Merilyn Baba M.D.   On: 10/07/2022 01:41     I have independently reviewed the above radiologic studies and discussed with the patient    Recent Lab Findings:      Lab Results  Component Value Date    WBC 6.1 10/06/2022    HGB 11.9 (L) 10/06/2022    HCT 36.6 10/06/2022    PLT 231 10/06/2022    GLUCOSE 109 (H) 10/06/2022    ALT 21 10/06/2022    AST 26 10/06/2022    NA 132 (L) 10/06/2022    K 4.5 10/06/2022    CL 96 (L) 10/06/2022    CREATININE 0.90 10/06/2022    BUN 19 10/06/2022    CO2 26 10/06/2022    INR 1.0 10/06/2022      Echo 07/25/22:  1. Left ventricular ejection fraction, by estimation, is 60 to 65%. The  left ventricle has normal function. The left ventricle has no regional  wall motion abnormalities. There is moderate left ventricular hypertrophy.  Left ventricular diastolic  parameters are consistent with Grade I diastolic dysfunction (impaired  relaxation). Elevated left ventricular end-diastolic pressure.   2. Right ventricular systolic function is normal. The right ventricular  size is normal.   3. Left atrial size was mildly dilated.   4. The mitral valve is degenerative. Mild mitral valve regurgitation. No  evidence of mitral stenosis. Severe mitral annular calcification.   5. Gradients have increased since 08/25/21 mean  23-> 29 peak 43.8 -> 51.3  with DVInow 0.24 and AVA 0.8 cm2 Consistent with severe low gradient AS .  The aortic valve is tricuspid. There is severe calcifcation of the aortic  valve. There is severe  thickening of the aortic valve. Aortic valve regurgitation is not  visualized. Severe aortic valve stenosis.   6. The inferior vena cava is normal in size with greater than 50%  respiratory variability, suggesting right atrial pressure of 3 mmHg.   FINDINGS   Left Ventricle: Left ventricular ejection fraction, by estimation, is 60  to 65%. The left ventricle has normal function. The left ventricle has no  regional wall motion abnormalities. The left ventricular internal cavity  size was normal in size. There is   moderate left ventricular hypertrophy. Left ventricular diastolic  parameters are consistent with Grade I diastolic dysfunction (impaired  relaxation). Elevated left ventricular end-diastolic pressure.   Right Ventricle: The right ventricular size is normal. No increase in  right ventricular wall thickness. Right ventricular systolic function is  normal.   Left Atrium: Left atrial size was mildly dilated.   Right Atrium: Right atrial size was normal in size.   Pericardium: There is no evidence of pericardial effusion.   Mitral Valve: The mitral valve is degenerative in appearance. There is  mild thickening of the mitral valve leaflet(s). There is mild  calcification of the mitral valve leaflet(s). Severe mitral annular  calcification. Mild mitral valve regurgitation. No  evidence of mitral valve stenosis.   Tricuspid Valve: The tricuspid valve is normal in structure. Tricuspid  valve regurgitation is not demonstrated. No evidence of tricuspid  stenosis.   Aortic Valve: Gradients have increased since 08/25/21 mean 23-> 29 peak  43.8 -> 51.3 with DVInow 0.24 and AVA 0.8 cm2 Consistent with severe low  gradient AS. The aortic valve is tricuspid. There is severe calcifcation   of the aortic valve. There is severe  thickening of the aortic valve. Aortic valve regurgitation is not  visualized. Severe aortic stenosis is present. Aortic valve mean gradient  measures 29.0 mmHg. Aortic valve peak gradient measures 51.3 mmHg. Aortic  valve area, by VTI measures 0.74 cm.   Pulmonic Valve: The pulmonic valve was normal in structure. Pulmonic valve  regurgitation is mild. No evidence of pulmonic stenosis.   Aorta: The aortic root is normal in size and structure.   Venous: The inferior vena cava is normal in size with greater than 50%  respiratory variability, suggesting right atrial pressure of 3 mmHg.   IAS/Shunts: No atrial level shunt detected by color flow Doppler.     EXAM: CT ANGIOGRAPHY CHEST, ABDOMEN AND PELVIS   TECHNIQUE: Multidetector CT imaging through the chest, abdomen and pelvis was performed using the standard protocol during bolus administration of intravenous contrast. Multiplanar reconstructed images and MIPs were obtained and reviewed to evaluate the vascular anatomy.   RADIATION DOSE REDUCTION: This exam was performed according to the departmental dose-optimization program which includes automated exposure control, adjustment of the mA and/or kV according to patient size and/or use of iterative reconstruction technique.   CONTRAST:  58m OMNIPAQUE IOHEXOL 350 MG/ML SOLN   COMPARISON:  None Available.   FINDINGS: CTA CHEST FINDINGS   Cardiovascular: Top-normal heart size. Diffuse thickening and coarse calcification of the aortic valve. No significant pericardial effusion/thickening. Atherosclerotic nonaneurysmal thoracic aorta. Normal caliber pulmonary arteries. No central pulmonary emboli.   Mediastinum/Nodes: No significant thyroid nodules. Unremarkable esophagus. Surgical clips in the right axilla. No axillary adenopathy. No pathologically enlarged mediastinal or hilar nodes.   Lungs/Pleura: No pneumothorax. No pleural  effusion. Solid 0.3 cm right lower lobe pulmonary nodule (series 5/image 70). Indistinctly marginated solid 0.9 cm posterior left upper lobe pulmonary nodule (series 5/image 35). Otherwise no acute consolidative airspace disease or additional significant pulmonary nodules.   Musculoskeletal: No aggressive appearing focal osseous lesions. Moderate thoracic spondylosis. Status post right mastectomy. Superficial subcutaneous cystic 1.5 cm right axillary lesion (series 4/image 24), most compatible with a benign sebaceous cyst.   CTA ABDOMEN AND PELVIS FINDINGS   Hepatobiliary: Normal liver with no liver mass. Normal gallbladder with no radiopaque cholelithiasis. No biliary ductal dilatation.   Pancreas: Normal, with no mass or duct dilation.   Spleen: Normal size. No mass.   Adrenals/Urinary Tract: Normal adrenals. Simple 1.5 cm posterior upper left renal cyst and additional subcentimeter hypodense bilateral scattered  renal cortical lesions that are too small to characterize, for which no follow-up imaging is recommended. No hydronephrosis. Normal bladder.   Stomach/Bowel: Normal non-distended stomach. Normal caliber small bowel with no small bowel wall thickening. Appendectomy. Marked sigmoid diverticulosis, with no large bowel wall thickening or significant pericolonic fat stranding.   Vascular/Lymphatic: Atherosclerotic nonaneurysmal abdominal aorta. No pathologically enlarged lymph nodes in the abdomen or pelvis.   Reproductive: Status post hysterectomy, with no abnormal findings at the vaginal cuff. No adnexal mass.   Other: No pneumoperitoneum, ascites or focal fluid collection.   Musculoskeletal: No aggressive appearing focal osseous lesions. Bilateral posterior spinal fusion L4-S1. Marked multilevel lumbar degenerative disc disease. Asymmetric advanced right hip osteoarthritis.   VASCULAR MEASUREMENTS PERTINENT TO TAVR:   AORTA:   Minimal Aortic Diameter-9.9 x 7.9  mm   Severity of Aortic Calcification-severe   RIGHT PELVIS:   Right Common Iliac Artery -   Minimal Diameter-7.9 x 6.3 mm   Tortuosity-mild-to-moderate   Calcification-moderate to severe   Right External Iliac Artery -   Minimal Diameter-7.4 x 7.2 mm   Tortuosity-mild-to-moderate   Calcification-none   Right Common Femoral Artery -   Minimal Diameter-6.8 x 6.4 mm   Tortuosity-mild   Calcification-moderate   LEFT PELVIS:   Left Common Iliac Artery -   Minimal Diameter-7.6 x 4.8 mm   Tortuosity-mild   Calcification-severe   Left External Iliac Artery -   Minimal Diameter-7.1 x 6.7 mm   Tortuosity-mild-to-moderate   Calcification-none   Left Common Femoral Artery -   Minimal Diameter-7.4 x 5.6 mm   Tortuosity-mild   Calcification-moderate to severe   Review of the MIP images confirms the above findings.   IMPRESSION: 1. Vascular findings and measurements pertinent to potential TAVR procedure, as detailed. 2. Diffuse thickening and coarse calcification of the aortic valve, compatible with the reported history of aortic valve stenosis. 3. Two solid pulmonary nodules, largest 0.9 cm in the left upper lobe. Recommend attention on follow-up chest CT in 3 months given history of breast cancer and absence of prior imaging. 4. Marked sigmoid diverticulosis. 5. Aortic Atherosclerosis (ICD10-I70.0).       Assessment / Plan:    58F with sympomatic severe aortic stenosis.  Meets criteria for AV replacement. She is low flow/ low gradient. Her STS risk is 5.8% CAD was non-obstructive Dental: no ongoing isues   Bailout: Yes Valve: 21m Edwards S3 Access: Transfemoral NYHA class: II   DJustice RocherMD CV Surgery    Problem List

## 2022-10-10 NOTE — Progress Notes (Signed)
  Echocardiogram 2D Echocardiogram has been performed.  Barbara Russell 10/10/2022, 8:08 AM

## 2022-10-10 NOTE — Interval H&P Note (Signed)
History and Physical Interval Note:  10/10/2022 6:38 AM  Barbara Russell  has presented today for surgery, with the diagnosis of Severe Aortic Stenosis.  The various methods of treatment have been discussed with the patient and family. After consideration of risks, benefits and other options for treatment, the patient has consented to  Procedure(s): Transcatheter Aortic Valve Replacement, Transfemoral (N/A) INTRAOPERATIVE TRANSTHORACIC ECHOCARDIOGRAM (N/A) as a surgical intervention.  The patient's history has been reviewed, patient examined, no change in status, stable for surgery.  I have reviewed the patient's chart and labs.  Questions were answered to the patient's satisfaction.     Pierre Bali Shakeisha Horine

## 2022-10-11 ENCOUNTER — Encounter (HOSPITAL_COMMUNITY): Payer: Self-pay | Admitting: Cardiovascular Disease

## 2022-10-11 ENCOUNTER — Inpatient Hospital Stay (HOSPITAL_COMMUNITY): Payer: Medicare Other

## 2022-10-11 ENCOUNTER — Ambulatory Visit: Payer: Medicare Other

## 2022-10-11 DIAGNOSIS — I350 Nonrheumatic aortic (valve) stenosis: Secondary | ICD-10-CM | POA: Diagnosis not present

## 2022-10-11 DIAGNOSIS — Z952 Presence of prosthetic heart valve: Secondary | ICD-10-CM

## 2022-10-11 DIAGNOSIS — I129 Hypertensive chronic kidney disease with stage 1 through stage 4 chronic kidney disease, or unspecified chronic kidney disease: Secondary | ICD-10-CM | POA: Diagnosis not present

## 2022-10-11 DIAGNOSIS — Z006 Encounter for examination for normal comparison and control in clinical research program: Secondary | ICD-10-CM | POA: Diagnosis not present

## 2022-10-11 DIAGNOSIS — E871 Hypo-osmolality and hyponatremia: Secondary | ICD-10-CM | POA: Diagnosis not present

## 2022-10-11 LAB — CBC
HCT: 28 % — ABNORMAL LOW (ref 36.0–46.0)
Hemoglobin: 9.6 g/dL — ABNORMAL LOW (ref 12.0–15.0)
MCH: 31.8 pg (ref 26.0–34.0)
MCHC: 34.3 g/dL (ref 30.0–36.0)
MCV: 92.7 fL (ref 80.0–100.0)
Platelets: 161 10*3/uL (ref 150–400)
RBC: 3.02 MIL/uL — ABNORMAL LOW (ref 3.87–5.11)
RDW: 13.2 % (ref 11.5–15.5)
WBC: 7.3 10*3/uL (ref 4.0–10.5)
nRBC: 0 % (ref 0.0–0.2)

## 2022-10-11 LAB — ECHOCARDIOGRAM COMPLETE
AR max vel: 3.35 cm2
AV Area VTI: 4.54 cm2
AV Area mean vel: 3.2 cm2
AV Mean grad: 8 mmHg
AV Peak grad: 10.5 mmHg
Ao pk vel: 1.62 m/s
Area-P 1/2: 2.97 cm2
Height: 63 in
S' Lateral: 2.5 cm
Weight: 2128 oz

## 2022-10-11 LAB — BASIC METABOLIC PANEL
Anion gap: 8 (ref 5–15)
BUN: 12 mg/dL (ref 8–23)
CO2: 24 mmol/L (ref 22–32)
Calcium: 9 mg/dL (ref 8.9–10.3)
Chloride: 98 mmol/L (ref 98–111)
Creatinine, Ser: 0.78 mg/dL (ref 0.44–1.00)
GFR, Estimated: >60 mL/min (ref >60)
Glucose, Bld: 103 mg/dL — ABNORMAL HIGH (ref 70–99)
Potassium: 4.2 mmol/L (ref 3.5–5.1)
Sodium: 130 mmol/L — ABNORMAL LOW (ref 135–145)

## 2022-10-11 LAB — MAGNESIUM: Magnesium: 1.6 mg/dL — ABNORMAL LOW (ref 1.7–2.4)

## 2022-10-11 NOTE — Progress Notes (Signed)
  Echocardiogram 2D Echocardiogram has been performed.  Barbara Russell M 10/11/2022, 8:33 AM

## 2022-10-11 NOTE — Plan of Care (Signed)
  Problem: Education: Goal: Knowledge of General Education information will improve Description: Including pain rating scale, medication(s)/side effects and non-pharmacologic comfort measures Outcome: Progressing   Problem: Clinical Measurements: Goal: Ability to maintain clinical measurements within normal limits will improve Outcome: Progressing Goal: Respiratory complications will improve Outcome: Progressing   Problem: Activity: Goal: Risk for activity intolerance will decrease Outcome: Progressing   Problem: Nutrition: Goal: Adequate nutrition will be maintained Outcome: Progressing   Problem: Coping: Goal: Level of anxiety will decrease Outcome: Progressing

## 2022-10-11 NOTE — Progress Notes (Signed)
CARDIAC REHAB PHASE I      Ambulated well with mobility today, distance limited by hip weakness. Denied sob or cp with mobility. Post TAVR education including heart healthy diet, site care, restrictions, risk factors, exercise guidelines and CRP2 reviewed. All questions and concerns addressed. Not interested in CRP2 at this time. Plan for home today.   [id number]  Vanessa Barbara, RN BSN 10/11/2022 9:36 AM

## 2022-10-11 NOTE — Plan of Care (Signed)

## 2022-10-11 NOTE — Plan of Care (Signed)
  Problem: Clinical Measurements: Goal: Ability to maintain clinical measurements within normal limits will improve Outcome: Progressing Goal: Respiratory complications will improve Outcome: Progressing   Problem: Nutrition: Goal: Adequate nutrition will be maintained Outcome: Progressing   Problem: Coping: Goal: Level of anxiety will decrease Outcome: Progressing

## 2022-10-11 NOTE — Progress Notes (Signed)
Mobility Specialist Progress Note:   10/11/22 0925  Mobility  Activity Ambulated with assistance in hallway  Level of Assistance Contact guard assist, steadying assist  Assistive Device Front wheel walker  Distance Ambulated (ft) 40 ft  Activity Response Tolerated well  $Mobility charge 1 Mobility   Pt received in bed willing to participate in mobility. Distance limited by hip pain. Left EOB with call bell in reach, all needs met and cardiac rehab in room.   [facility] Surveyor, mining Chat only

## 2022-10-11 NOTE — Anesthesia Postprocedure Evaluation (Signed)
Anesthesia Post Note  Patient: Barbara Russell  Procedure(s) Performed: Transcatheter Aortic Valve Replacement, Transfemoral (Chest) INTRAOPERATIVE TRANSTHORACIC ECHOCARDIOGRAM ULTRASOUND GUIDANCE FOR VASCULAR ACCESS     Patient location during evaluation: PACU Anesthesia Type: MAC Level of consciousness: awake and alert Pain management: pain level controlled Vital Signs Assessment: post-procedure vital signs reviewed and stable Respiratory status: spontaneous breathing, nonlabored ventilation, respiratory function stable and patient connected to nasal cannula oxygen Cardiovascular status: stable and blood pressure returned to baseline Postop Assessment: no apparent nausea or vomiting Anesthetic complications: no   No notable events documented.  Last Vitals:  Vitals:   10/11/22 0424 10/11/22 0900  BP: (!) 132/51 (!) 114/41  Pulse:  76  Resp: 18 18  Temp: 37.8 C 37.2 C  SpO2: 93% 94%    Last Pain:  Vitals:   10/11/22 0900  TempSrc: Oral  PainSc: 0-No pain                 Belenda Cruise P Kaiea Esselman

## 2022-10-11 NOTE — Progress Notes (Signed)
Patient and son given all discharge instructions both verbalize understanding. Patient assisted to wheelchair and discharged home with family.

## 2022-10-13 ENCOUNTER — Other Ambulatory Visit: Payer: Medicare Other

## 2022-10-13 ENCOUNTER — Telehealth: Payer: Self-pay | Admitting: Cardiology

## 2022-10-13 NOTE — Telephone Encounter (Signed)
  [facility]   MULTIDISCIPLINARY HEART VALVE TEAM   Patient and son contacted regarding discharge from [facility] on 10/11/22. She is doing well with no concerns today.    Patient understands to follow up with provider Structural Heart APP Patient understands discharge instructions? Yes  Patient understands medications and regimen? Yes  Patient understands to bring all medications to this visit? Yes   Kathyrn Drown NP-C Structural Heart Team  Pager: [phone]

## 2022-10-17 ENCOUNTER — Telehealth: Payer: Self-pay | Admitting: Cardiology

## 2022-10-17 NOTE — Telephone Encounter (Signed)
Pt states that since having Heart Val Replacement last week she has been seeing patterns. Pt said that she reached out to her eye doctor and was told that she needed to call our office. Pt would like a callback regarding this matter. Please advise

## 2022-10-17 NOTE — Telephone Encounter (Signed)
The pt states that she is seeing patterns in her visual field.  She contacted her ophthalmologist this morning and they advised her to contact cardiology for further advisement.  The pt states that she noticed the patterns in her visual field shortly after surgery while she was hospitalized and they have remained constant. The patterns are the same as what she experienced when she was diagnosed with macular degeneration. She did not make anyone aware of her visual changes because she felt this was related to missing 2 doses of Preservision while hospitalized. The pt denies any slurred speech, one sided weakness or stroke like symptoms.  She states that other than her vision she is doing well. I have arranged an earlier appointment with the structural heart team tomorrow at 1:00 PM for evaluation.

## 2022-10-18 ENCOUNTER — Ambulatory Visit: Payer: Medicare Other | Attending: Cardiology | Admitting: Cardiology

## 2022-10-18 ENCOUNTER — Telehealth: Payer: Self-pay | Admitting: *Deleted

## 2022-10-18 VITALS — BP 138/56 | HR 88 | Wt 130.8 lb

## 2022-10-18 DIAGNOSIS — H53133 Sudden visual loss, bilateral: Secondary | ICD-10-CM

## 2022-10-18 DIAGNOSIS — Z952 Presence of prosthetic heart valve: Secondary | ICD-10-CM

## 2022-10-18 DIAGNOSIS — H532 Diplopia: Secondary | ICD-10-CM

## 2022-10-18 DIAGNOSIS — H539 Unspecified visual disturbance: Secondary | ICD-10-CM

## 2022-10-18 NOTE — Progress Notes (Signed)
[facility] [place]                                     Cardiology Office Note:    Date:  10/19/2022   ID:  Barbara Russell, DOB [birth year], MRN [id number]  PCP:  Seward Carol, MD  [facility] Cardiologist:  Candee Furbish, MD / Dr. Buena Irish & Dr. Tenny Craw (TAVR)  [facility] Electrophysiologist:  None   Referring MD: Seward Carol, MD   Chief Complaint  Patient presents with   Follow-up    Visual disturbances    History of Present Illness:    Barbara Russell is a 88 y.o. female with a hx of anxiety, breast cancer s/p mastectomy, borderline DM, HTN, HLD, hyponatremia, and severe paradoxical LFLG who presented to [facility] on 10/10/22 for planned TAVR.    She has been followed for moderate aortic stenosis by Dr. Marlou Porch. She recently developed worsening dyspnea and chest pain. Echo 07/25/22 showed LVEF=60-65%, moderate LVH, mild MR and paradoxical LFLG AS with mean gradient 29 mmHg, peak gradient 51.3 mmHg, AVA 0.74 cm2, DI 0.24. [facility] 08/17/22 showed mild non obst CAD and normal heart pressures.    She was evaluated by the multidisciplinary valve team and felt to have severe, symptomatic aortic stenosis and to be a suitable candidate for TAVR. She is now s/p successful TAVR with a 23 mm Edwards Sapien 3 Ultra Resilia THV via the TF approach on 10/10/22. Post operative echo with no PVL and stable valve function with mean gradient of 8 mm Hg, peak gradient 13 mm Hg, DVI 0.67 and EOA 4.5 cm2. Restarted in home ASA '81mg'$  QD and discharged home 10/11/22.   She then called the office 10/17/22 with visual changes and set up for OP appointment with myself. Today she is here with her son. She is overall doing very well since discharge however has complaints of new visual changes that began while hospitalized after TAVR. She was hoping that her symptoms would lessen after discharge but given no change, she contacted our office for further evaluation. She has a hx  of macular degeneration with prior visual changes similar to her current complaints. She reports that she was on a medication to help however I do not see this on her medication list. She denies facial asymmetry, upper or lower extremity weakness, headaches, dizziness, or syncope. Denies chest pain, SOB, palpitations, LE edema, or orthopnea. She is tolerating ASA well with no issues.   Past Medical History:  Diagnosis Date   Anxiety    Breast cancer ([place])    Hx of BC, s/p mastectomy on right 1999, s/p tamoxifen X1 year   Chest pain    EC Stress test 12/21/11 Low risk, no ischemia, normal EF   Chronic kidney disease    Coronary artery disease    Remote Hx of CAD, normal stress test 2009   Diverticulitis    GERD (gastroesophageal reflux disease)    No longer a problem   Hypercholesteremia    Hypertension    Insomnia    Lichen sclerosus    Low back pain    Myocardial infarction ([place]) 12/11/1998   Osteoarthrosis, unspecified whether generalized or localized, unspecified site    Pain    Several year Hx of pain/ SOB radiating to her neck   S/P TAVR (transcatheter aortic valve replacement) 10/10/2022   s/p TAVR with  a 21m Edwards S3UR via the TF approach by Dr. MAngelena Form& Dr. ETenny Craw  Severe aortic stenosis    UTI (urinary tract infection)     Past Surgical History:  Procedure Laterality Date   ABDOMINAL HYSTERECTOMY     APPENDECTOMY     HAND SURGERY Bilateral    CTS and trigger finger on right and left   INTRAOPERATIVE TRANSTHORACIC ECHOCARDIOGRAM N/A 10/10/2022   Procedure: INTRAOPERATIVE TRANSTHORACIC ECHOCARDIOGRAM;  Surgeon: MBurnell Blanks MD;  [facility]  Service: Open Heart Surgery;  Laterality: N/A;   MASTECTOMY Right 12/11/1997   REPLACEMENT TOTAL KNEE Right 11/10/2009   RIGHT HEART CATH AND CORONARY ANGIOGRAPHY N/A 08/17/2022   Procedure: RIGHT HEART CATH AND CORONARY ANGIOGRAPHY;  Surgeon: MBurnell Blanks MD;  [facility];   Service: Cardiovascular;  Laterality: N/A;   TOTAL KNEE REVISION Right 05/17/2022   Procedure: Right knee polyethylene revision;  Surgeon: AGaynelle Arabian MD;  Location: [facility];  Service: Orthopedics;  Laterality: Right;   TRANSCATHETER AORTIC VALVE REPLACEMENT, TRANSFEMORAL N/A 10/10/2022   Procedure: Transcatheter Aortic Valve Replacement, Transfemoral;  Surgeon: MBurnell Blanks MD;  [place] [facility]  Service: Open Heart Surgery;  Laterality: N/A;   ULTRASOUND GUIDANCE FOR VASCULAR ACCESS  10/10/2022   Procedure: ULTRASOUND GUIDANCE FOR VASCULAR ACCESS;  Surgeon: MBurnell Blanks MD;  [place] [facility]  Service: Open Heart Surgery;;   VESICOVAGINAL FISTULA CLOSURE W/ TAH  12/12/1967    Current Medications: Current Meds  Medication Sig   acetaminophen (TYLENOL) 325 MG tablet Take 650 mg by mouth every 6 (six) hours as needed for moderate pain or mild pain.   aspirin 81 MG tablet Take 1 tablet (81 mg total) by mouth daily.   b complex vitamins capsule Take 1 capsule by mouth in the morning.   Camphor-Menthol-Methyl Sal (SALONPAS) 3.12-16-08 % PTCH Place 1 patch onto the skin daily as needed (pain).   cetirizine (ZYRTEC) 10 MG tablet Take 10 mg by mouth daily as needed for allergies or rhinitis.   Cholecalciferol (VITAMIN D3 PO) Take 1,000 Units by mouth in the morning.   CRANBERRY PO Take 1 tablet by mouth at bedtime.   furosemide (LASIX) 20 MG tablet TAKE 1/2 TABLET BY MOUTH AS NEEDED (Patient taking differently: Take 20 mg by mouth in the morning.)   gabapentin (NEURONTIN) 300 MG capsule Take 600 mg by mouth at bedtime.    hydrALAZINE (APRESOLINE) 25 MG tablet Take 1 tablet (25 mg total) by mouth daily as needed (blood pressure of 160 or above).   HYDROcodone-acetaminophen (NORCO) 5-325 MG tablet Take 1 tablet by mouth every 6 (six) hours as needed.   isosorbide mononitrate (IMDUR) 30 MG 24 hr tablet TAKE 1 TABLET BY MOUTH EVERY DAY (Patient taking differently: Take 30 mg by  mouth daily at 4 PM.)   losartan (COZAAR) 50 MG tablet Take 1 tablet (50 mg total) by mouth 2 (two) times daily. (Patient taking differently: Take 50 mg by mouth See admin instructions. Take 1 tablet (50 mg) by mouth scheduled every morning, may repeat dose in the afternoon if blood pressure is elevated.)   mineral oil-hydrophilic petrolatum (AQUAPHOR) ointment Apply 1 Application topically daily as needed for irritation.   Multiple Vitamins-Minerals (PRESERVISION AREDS 2) CAPS Take 1 capsule by mouth 2 (two) times daily.   nitroGLYCERIN (NITROSTAT) 0.4 MG SL tablet PLACE 1 TABLET UNDER THE TONGUE EVERY 5 MINUTES AS NEEDED FOR CHEST PAIN.   Omega-3 Fatty Acids (OMEGA-3 FISH  OIL PO) Take 1,000 mg by mouth every Monday, Tuesday, Wednesday, Thursday, and Friday.   ondansetron (ZOFRAN) 4 MG tablet Take 4 mg by mouth every 4 (four) hours as needed for vomiting or nausea.   ONETOUCH ULTRA test strip CHECK ONCE DAILY AS DIRECTED   Polyethyl Glycol-Propyl Glycol (SYSTANE OP) Place 1 drop into both eyes in the morning and at bedtime.   saccharomyces boulardii (FLORASTOR) 250 MG capsule Take 250-500 mg by mouth daily in the afternoon.   spironolactone (ALDACTONE) 25 MG tablet Take 1 tablet (25 mg total) by mouth daily.   zolpidem (AMBIEN) 5 MG tablet Take 7.5 mg by mouth at bedtime.     Allergies:   Amlodipine, Benadryl [diphenhydramine hcl (sleep)], Broccoli flavor [flavoring agent], Brussels sprouts [brassica oleracea], Cabbage, Ciprofloxacin, Collard greens [wild lettuce extract (lactuca virosa)], Lactose intolerance (gi), Macrobid [nitrofurantoin monohyd macro], Naproxen, Quinine sulfate [quinine], Rosuvastatin, Sulfa antibiotics, Tramadol, Penicillins, and Prednisone   Social History   Socioeconomic History   Marital status: Widowed    Spouse name: Not on file   Number of children: 3   Years of education: Not on file   Highest education level: Not on file  Occupational History   Occupation:  Retired Pharmacist, hospital  Tobacco Use   Smoking status: Former    Packs/day: 1.00    Types: Cigarettes    Quit date: 07/08/1997    Years since quitting: 25.2   Smokeless tobacco: Never  Vaping Use   Vaping Use: Never used  Substance and Sexual Activity   Alcohol use: No   Drug use: No   Sexual activity: Not on file  Other Topics Concern   Not on file  Social History Narrative   Not on file   Social Determinants of Health   Financial Resource Strain: Not on file  Food Insecurity: Not on file  Transportation Needs: Not on file  Physical Activity: Not on file  Stress: Not on file  Social Connections: Not on file     Family History: The patient's family history includes Cancer in her mother; Heart disease in her father.  ROS:   Please see the history of present illness.    All other systems reviewed and are negative.  EKGs/Labs/Other Studies Reviewed:    The following studies were reviewed today:  TAVR OPERATIVE NOTE     Date of Procedure:                10/10/2022   Preoperative Diagnosis:      Severe Aortic Stenosis    Postoperative Diagnosis:    Same    Procedure:        Transcatheter Aortic Valve Replacement - Transfemoral Approach             Edwards Sapien 3 THV (size 23 mm, model # T562222, serial # [id number])              Co-Surgeons:                        Lauree Chandler, MD and Justice Rocher, MD   Anesthesiologist:                  Stoltz[place]us   Echocardiographer:              Gasper Sells   Pre-operative Echo Findings: Severe aortic stenosis Normal left ventricular systolic function   Post-operative Echo Findings: No paravalvular leak Normal left ventricular systolic function _____________   Echocardiogram 10/11/22:  1. The aortic valve is has been replaced by a 23 mm Sapien Valve. Aortic  valve regurgitation is not visualized. No aortic stenosis is present.  There is a 23 mm Edwards valve present in the aortic position. Echo  findings are  consistent with normal  structure and function of the aortic valve prosthesis. Mean gradient of 8  mm Hg, Peak gradient 13 mm Hg, DVI 0.67 and EOA 4.5 cm2.   2. Left ventricular ejection fraction, by estimation, is 70 to 75%. The  left ventricle has hyperdynamic function. Left ventricular endocardial  border not optimally defined to evaluate regional wall motion. There is  moderate concentric left ventricular  hypertrophy. Left ventricular diastolic parameters are consistent with  Grade I diastolic dysfunction (impaired relaxation).   3. Right ventricular systolic function is normal. The right ventricular  size is not well visualized.   4. MVA 4 cm2 by 2D Planimetry. The mitral valve is degenerative. Trivial  mitral valve regurgitation. The mean mitral valve gradient is 5.0 mmHg.  Severe mitral annular calcification.   5. Aortic Normal Ascending Aorta.   Comparison(s): Prior images reviewed side by side. Trivial PVL no longer  visualized.    EKG:  EKG is not ordered today.    Recent Labs: 10/06/2022: ALT 21 10/18/2022: BUN 38; Creatinine, Ser 1.08; Hemoglobin 10.9; Magnesium 2.3; Platelets 265; Potassium 4.9; Sodium 133  Recent Lipid Panel No results found for: "CHOL", "TRIG", "HDL", "CHOLHDL", "VLDL", "LDLCALC", "LDLDIRECT"  Physical Exam:    VS:  BP (!) 138/56 (BP Location: Left Arm, Patient Position: Sitting, Cuff Size: Normal)   Pulse 88   Wt 130 lb 12.8 oz (59.3 kg)   LMP  (LMP Unknown)   BMI 23.17 kg/m     Wt Readings from Last 3 Encounters:  10/18/22 130 lb 12.8 oz (59.3 kg)  10/11/22 133 lb (60.3 kg)  10/07/22 132 lb 4.4 oz (60 kg)    General: Well developed, well nourished, NAD Neck: Negative for carotid bruits. No JVD Lungs:Clear to ausculation bilaterally. No wheezes, rales, or rhonchi. Breathing is unlabored. Cardiovascular: RRR with S1 S2. No murmurs MSK: Strength and tone appear normal for age. 5/5 in all extremities Extremities: No edema.  Neuro: Alert  and oriented. No focal deficits. No facial asymmetry. MAE spontaneously. Psych: Responds to questions appropriately with normal affect.    ASSESSMENT/PLAN:    Severe AS: s/p successful TAVR with a 23 mm Edwards Sapien 3 Ultra Resilia THV via the TF approach on 10/10/22. Post operative echo with stable valve function, no PVL and mean gradient at 90mHg. Groin sites remain stable. Reports new visual changes since TAVR. No other neuro changes including facial asymmetry, upper or lower extremity weakness. See plan below. Continue ASA '81mg'$ . Plan to follow with our team in 3 weeks with echocardiogram. Requires lifelong dental SBE with TAVR.   Visual changes s/p TAVR with hx of macular degeneration: Patient called the office with new visual changes that began while hospitalized after TAVR. Hoping that her symptoms would lessen after discharge but given no change, she contacted our office for further evaluation. Hx of macular degeneration with prior visual changes similar to her current complaints including geometric shaping and blue/pink coloring. No other neuro changes. Plan head CT for further evaluation. She follows closely with ophthalmologist so I have asked her to see them soon for exam comparison. May require brain MRI if inconclusive. Follows with our team 12/6 and Dr. SMarlou Porchnext week. Will follow imaging.   HTN: BP  well controlled. Resumed on home meds including Imdur, spironolactone.    Hyponatremia: Appears chronic with no changes needed at this time.    Pulmonary nodules: pre TAVR CT showed two solid pulmonary nodules, largest 0.9 cm in the left upper lobe. Recommend attention on follow-up chest CT in 3 months given history of breast cancer and absence of prior imaging. This will be discussed and arranged in the outpatient setting. (11/16/2023)  Hip pain: Scheduled for hip arthroplasty in Jan 2024 with Dr. Maureen Ralphs. We will clear her for this at her 1 month appt.    Medication Adjustments/Labs  and Tests Ordered: Current medicines are reviewed at length with the patient today.  Concerns regarding medicines are outlined above.  Orders Placed This Encounter  Procedures   CT HEAD WO CONTRAST (5MM)   CBC   Basic metabolic panel   Magnesium   No orders of the defined types were placed in this encounter.   Patient Instructions  Medication Instructions:   *If you need a refill on your cardiac medications before your next appointment, please call your pharmacy*   Lab Work: BMET, CBC and Mag If you have labs (blood work) drawn today and your tests are completely normal, you will receive your results only by: [place] (if you have MyChart) OR A paper copy in the mail If you have any lab test that is abnormal or we need to change your treatment, we will call you to review the results.   Testing/Procedures: CT of head Non-Cardiac CT scanning, (CAT scanning), is a noninvasive, special x-ray that produces cross-sectional images of the body using x-rays and a computer. CT scans help physicians diagnose and treat medical conditions. For some CT exams, a contrast material is used to enhance visibility in the area of the body being studied. CT scans provide greater clarity and reveal more details than regular x-ray exams.    Follow-Up: At [facility], you and your health needs are our priority.  As part of our continuing mission to provide you with exceptional heart care, we have created designated Provider Care Teams.  These Care Teams include your primary Cardiologist (physician) and Advanced Practice Providers (APPs -  Physician Assistants and Nurse Practitioners) who all work together to provide you with the care you need, when you need it.  We recommend signing up for the patient portal called "MyChart".  Sign up information is provided on this After Visit Summary.  MyChart is used to connect with patients for Virtual Visits (Telemedicine).  Patients are able to view  lab/test results, encounter notes, upcoming appointments, etc.  Non-urgent messages can be sent to your provider as well.   To learn more about what you can do with MyChart, go to [email or web address].    Your next appointment:   As scheduled  Provider:   Candee Furbish, MD     Other Instructions Follow up with Eye doctor  Important Information About Sugar         Signed, Kathyrn Drown, NP  10/19/2022 12:21 PM    [place]

## 2022-10-18 NOTE — Telephone Encounter (Signed)
Preoperative team, patient has appointment today with Dr. Marlou Porch.  Please add preoperative cardiac evaluation to appointment note.  Thank you for your help.  I will remove patient from preoperative pool.  Jossie Ng. Kross Swallows NP-C     10/18/2022, 12:27 PM [place] [address] Office [phone] Fax [phone]

## 2022-10-18 NOTE — Patient Instructions (Signed)
Medication Instructions:   *If you need a refill on your cardiac medications before your next appointment, please call your pharmacy*   Lab Work: BMET, CBC and Mag If you have labs (blood work) drawn today and your tests are completely normal, you will receive your results only by: [place] (if you have MyChart) OR A paper copy in the mail If you have any lab test that is abnormal or we need to change your treatment, we will call you to review the results.   Testing/Procedures: CT of head Non-Cardiac CT scanning, (CAT scanning), is a noninvasive, special x-ray that produces cross-sectional images of the body using x-rays and a computer. CT scans help physicians diagnose and treat medical conditions. For some CT exams, a contrast material is used to enhance visibility in the area of the body being studied. CT scans provide greater clarity and reveal more details than regular x-ray exams.    Follow-Up: At [facility], you and your health needs are our priority.  As part of our continuing mission to provide you with exceptional heart care, we have created designated Provider Care Teams.  These Care Teams include your primary Cardiologist (physician) and Advanced Practice Providers (APPs -  Physician Assistants and Nurse Practitioners) who all work together to provide you with the care you need, when you need it.  We recommend signing up for the patient portal called "MyChart".  Sign up information is provided on this After Visit Summary.  MyChart is used to connect with patients for Virtual Visits (Telemedicine).  Patients are able to view lab/test results, encounter notes, upcoming appointments, etc.  Non-urgent messages can be sent to your provider as well.   To learn more about what you can do with MyChart, go to [email or web address].    Your next appointment:   As scheduled  Provider:   Candee Furbish, MD     Other Instructions Follow up with Eye doctor  Important  Information About Sugar

## 2022-10-18 NOTE — Telephone Encounter (Signed)
   Pre-operative Risk Assessment    Patient Name: Barbara Russell  DOB: 02-05-1930 MRN: [id number]      Request for Surgical Clearance    Procedure:   RIGHT TOTAL HIP ARTHROPLASTY  Date of Surgery:  Clearance 12/27/22                                 Surgeon:  DR. Gaynelle Arabian Surgeon's Group or Practice Name:  [facility] Phone number:  [phone] Fax number:  [phone] ATTN: [place]   Type of Clearance Requested:   - Medical ; ASA   Type of Anesthesia:   CHOICE   Additional requests/questions:    Jiles Prows   10/18/2022, 9:47 AM

## 2022-10-19 LAB — CBC
Hematocrit: 31.9 % — ABNORMAL LOW (ref 34.0–46.6)
Hemoglobin: 10.9 g/dL — ABNORMAL LOW (ref 11.1–15.9)
MCH: 31.7 pg (ref 26.6–33.0)
MCHC: 34.2 g/dL (ref 31.5–35.7)
MCV: 93 fL (ref 79–97)
Platelets: 265 10*3/uL (ref 150–450)
RBC: 3.44 x10E6/uL — ABNORMAL LOW (ref 3.77–5.28)
RDW: 12.6 % (ref 11.7–15.4)
WBC: 8 10*3/uL (ref 3.4–10.8)

## 2022-10-19 LAB — BASIC METABOLIC PANEL
BUN/Creatinine Ratio: 35 — ABNORMAL HIGH (ref 12–28)
BUN: 38 mg/dL — ABNORMAL HIGH (ref 10–36)
CO2: 24 mmol/L (ref 20–29)
Calcium: 9.9 mg/dL (ref 8.7–10.3)
Chloride: 94 mmol/L — ABNORMAL LOW (ref 96–106)
Creatinine, Ser: 1.08 mg/dL — ABNORMAL HIGH (ref 0.57–1.00)
Glucose: 122 mg/dL — ABNORMAL HIGH (ref 70–99)
Potassium: 4.9 mmol/L (ref 3.5–5.2)
Sodium: 133 mmol/L — ABNORMAL LOW (ref 134–144)
eGFR: 49 mL/min/{1.73_m2} — ABNORMAL LOW (ref >59)

## 2022-10-19 LAB — MAGNESIUM: Magnesium: 2.3 mg/dL (ref 1.6–2.3)

## 2022-10-19 MED FILL — Potassium Chloride Inj 2 mEq/ML: INTRAVENOUS | Qty: 40 | Status: AC

## 2022-10-19 MED FILL — Magnesium Sulfate Inj 50%: INTRAMUSCULAR | Qty: 10 | Status: AC

## 2022-10-19 MED FILL — Heparin Sodium (Porcine) Inj 1000 Unit/ML: Qty: 1000 | Status: AC

## 2022-10-20 DIAGNOSIS — H353133 Nonexudative age-related macular degeneration, bilateral, advanced atrophic without subfoveal involvement: Secondary | ICD-10-CM | POA: Diagnosis not present

## 2022-10-20 DIAGNOSIS — Z4802 Encounter for removal of sutures: Secondary | ICD-10-CM | POA: Diagnosis not present

## 2022-10-20 DIAGNOSIS — H5316 Psychophysical visual disturbances: Secondary | ICD-10-CM | POA: Diagnosis not present

## 2022-10-24 ENCOUNTER — Ambulatory Visit: Payer: Medicare Other | Attending: Cardiology | Admitting: Cardiology

## 2022-10-24 ENCOUNTER — Encounter: Payer: Self-pay | Admitting: Cardiology

## 2022-10-24 VITALS — BP 130/50 | HR 73 | Ht 63.0 in | Wt 131.0 lb

## 2022-10-24 DIAGNOSIS — H539 Unspecified visual disturbance: Secondary | ICD-10-CM | POA: Insufficient documentation

## 2022-10-24 DIAGNOSIS — I2510 Atherosclerotic heart disease of native coronary artery without angina pectoris: Secondary | ICD-10-CM

## 2022-10-24 DIAGNOSIS — I2583 Coronary atherosclerosis due to lipid rich plaque: Secondary | ICD-10-CM

## 2022-10-24 DIAGNOSIS — Z952 Presence of prosthetic heart valve: Secondary | ICD-10-CM | POA: Insufficient documentation

## 2022-10-24 MED ORDER — FUROSEMIDE 40 MG PO TABS: ORAL_TABLET | ORAL | 3 refills | Status: DC

## 2022-10-24 NOTE — Progress Notes (Signed)
Cardiology Office Note:    Date:  10/24/2022   ID:  Barbara Russell, DOB 01/21/1930, MRN [id number]  PCP:  Seward Carol, MD   [facility] Providers Cardiologist:  Candee Furbish, MD     Referring MD: Seward Carol, MD    History of Present Illness:    Barbara Russell is a 88 y.o. female TAVR with a 23 mm Edwards Sapien 3 Ultra Resilia THV via the TF approach on 10/10/22.   She has been experiencing continued lower extremity edema, has 2+ pitting edema in her lower extremities.  She tried some increase Lasix prior to her TAVR but this fluid has reaccumulated.  She also tries compression hose periodically at home.  After the TAVR she did report to Sharee Pimple that she was having visual changes some waviness.  She saw a retinal specialist.  They stated that her macular degeneration has unchanged.  Dates that her eye waviness is a little bit better.  Previously it looks like my sure it would be planned for instance.  She also states that she has had some experiences where she feels off balance somewhat at times but this is rare.  Feeling better.  Almost like vertigo.  She has planned a CT scan in a few days.  Tolerating her aspirin.  Still having hip pain.  Dr. Wynelle Link.  Past Medical History:  Diagnosis Date   Anxiety    Breast cancer ([place])    Hx of BC, s/p mastectomy on right 1999, s/p tamoxifen X1 year   Chest pain    EC Stress test 12/21/11 Low risk, no ischemia, normal EF   Chronic kidney disease    Coronary artery disease    Remote Hx of CAD, normal stress test 2009   Diverticulitis    GERD (gastroesophageal reflux disease)    No longer a problem   Hypercholesteremia    Hypertension    Insomnia    Lichen sclerosus    Low back pain    Myocardial infarction ([place]) 12/11/1998   Osteoarthrosis, unspecified whether generalized or localized, unspecified site    Pain    Several year Hx of pain/ SOB radiating to her neck   S/P TAVR (transcatheter aortic valve replacement)  10/10/2022   s/p TAVR with a 59m Edwards S3UR via the TF approach by Dr. MAngelena Form& Dr. ETenny Craw  Severe aortic stenosis    UTI (urinary tract infection)     Past Surgical History:  Procedure Laterality Date   ABDOMINAL HYSTERECTOMY     APPENDECTOMY     HAND SURGERY Bilateral    CTS and trigger finger on right and left   INTRAOPERATIVE TRANSTHORACIC ECHOCARDIOGRAM N/A 10/10/2022   Procedure: INTRAOPERATIVE TRANSTHORACIC ECHOCARDIOGRAM;  Surgeon: MBurnell Blanks MD;  [facility]  Service: Open Heart Surgery;  Laterality: N/A;   MASTECTOMY Right 12/11/1997   REPLACEMENT TOTAL KNEE Right 11/10/2009   RIGHT HEART CATH AND CORONARY ANGIOGRAPHY N/A 08/17/2022   Procedure: RIGHT HEART CATH AND CORONARY ANGIOGRAPHY;  Surgeon: MBurnell Blanks MD;  [facility];  Service: Cardiovascular;  Laterality: N/A;   TOTAL KNEE REVISION Right 05/17/2022   Procedure: Right knee polyethylene revision;  Surgeon: AGaynelle Arabian MD;  Location: [facility];  Service: Orthopedics;  Laterality: Right;   TRANSCATHETER AORTIC VALVE REPLACEMENT, TRANSFEMORAL N/A 10/10/2022   Procedure: Transcatheter Aortic Valve Replacement, Transfemoral;  Surgeon: MBurnell Blanks MD;  [place] [facility]  Service: Open Heart Surgery;  Laterality: N/A;   ULTRASOUND GUIDANCE  FOR VASCULAR ACCESS  10/10/2022   Procedure: ULTRASOUND GUIDANCE FOR VASCULAR ACCESS;  Surgeon: Burnell Blanks, MD;  Location: [place];  Service: Open Heart Surgery;;   VESICOVAGINAL FISTULA CLOSURE W/ TAH  12/12/1967    Current Medications: Current Meds  Medication Sig   acetaminophen (TYLENOL) 325 MG tablet Take 650 mg by mouth every 6 (six) hours as needed for moderate pain or mild pain.   aspirin 81 MG tablet Take 1 tablet (81 mg total) by mouth daily.   b complex vitamins capsule Take 1 capsule by mouth in the morning.   Camphor-Menthol-Methyl Sal (SALONPAS) 3.12-16-08 % PTCH Place 1 patch onto the skin daily as  needed (pain).   cetirizine (ZYRTEC) 10 MG tablet Take 10 mg by mouth daily as needed for allergies or rhinitis.   Cholecalciferol (VITAMIN D3 PO) Take 1,000 Units by mouth in the morning.   CRANBERRY PO Take 1 tablet by mouth at bedtime.   furosemide (LASIX) 40 MG tablet Take 1 tablet (40 mg total) by mouth 2 (two) times daily for 3 days, THEN 1 tablet (40 mg total) daily.   gabapentin (NEURONTIN) 300 MG capsule Take 600 mg by mouth at bedtime.    hydrALAZINE (APRESOLINE) 25 MG tablet Take 1 tablet (25 mg total) by mouth daily as needed (blood pressure of 160 or above).   HYDROcodone-acetaminophen (NORCO) 5-325 MG tablet Take 1 tablet by mouth every 6 (six) hours as needed.   isosorbide mononitrate (IMDUR) 30 MG 24 hr tablet TAKE 1 TABLET BY MOUTH EVERY DAY   losartan (COZAAR) 50 MG tablet Take 1 tablet (50 mg total) by mouth 2 (two) times daily. (Patient taking differently: Take 50 mg by mouth See admin instructions. Take 1 tablet (50 mg) by mouth scheduled every morning, may repeat dose in the afternoon if blood pressure is elevated.)   mineral oil-hydrophilic petrolatum (AQUAPHOR) ointment Apply 1 Application topically daily as needed for irritation.   Multiple Vitamins-Minerals (PRESERVISION AREDS 2) CAPS Take 1 capsule by mouth 2 (two) times daily.   nitroGLYCERIN (NITROSTAT) 0.4 MG SL tablet PLACE 1 TABLET UNDER THE TONGUE EVERY 5 MINUTES AS NEEDED FOR CHEST PAIN.   Omega-3 Fatty Acids (OMEGA-3 FISH OIL PO) Take 1,000 mg by mouth every Monday, Tuesday, Wednesday, Thursday, and Friday.   ondansetron (ZOFRAN) 4 MG tablet Take 4 mg by mouth every 4 (four) hours as needed for vomiting or nausea.   ONETOUCH ULTRA test strip CHECK ONCE DAILY AS DIRECTED   Polyethyl Glycol-Propyl Glycol (SYSTANE OP) Place 1 drop into both eyes in the morning and at bedtime.   saccharomyces boulardii (FLORASTOR) 250 MG capsule Take 250-500 mg by mouth daily in the afternoon.   spironolactone (ALDACTONE) 25 MG tablet  Take 1 tablet (25 mg total) by mouth daily.   zolpidem (AMBIEN) 5 MG tablet Take 7.5 mg by mouth at bedtime.   [DISCONTINUED] furosemide (LASIX) 20 MG tablet TAKE 1/2 TABLET BY MOUTH AS NEEDED     Allergies:   Amlodipine, Benadryl [diphenhydramine hcl (sleep)], Broccoli flavor [flavoring agent], Brussels sprouts [brassica oleracea], Cabbage, Ciprofloxacin, Collard greens [wild lettuce extract (lactuca virosa)], Lactose intolerance (gi), Macrobid [nitrofurantoin monohyd macro], Naproxen, Quinine sulfate [quinine], Rosuvastatin, Sulfa antibiotics, Tramadol, Penicillins, and Prednisone   Social History   Socioeconomic History   Marital status: Widowed    Spouse name: Not on file   Number of children: 3   Years of education: Not on file   Highest education level: Not on file  Occupational  History   Occupation: Retired Pharmacist, hospital  Tobacco Use   Smoking status: Former    Packs/day: 1.00    Types: Cigarettes    Quit date: 07/08/1997    Years since quitting: 25.3   Smokeless tobacco: Never  Vaping Use   Vaping Use: Never used  Substance and Sexual Activity   Alcohol use: No   Drug use: No   Sexual activity: Not on file  Other Topics Concern   Not on file  Social History Narrative   Not on file   Social Determinants of Health   Financial Resource Strain: Not on file  Food Insecurity: Not on file  Transportation Needs: Not on file  Physical Activity: Not on file  Stress: Not on file  Social Connections: Not on file     Family History: The patient's family history includes Cancer in her mother; Heart disease in her father.  ROS:   Please see the history of present illness.    No chest pain all other systems reviewed and are negative.  EKGs/Labs/Other Studies Reviewed:    The following studies were reviewed today:  Echocardiogram 10/11/22:   1. The aortic valve is has been replaced by a 23 mm Sapien Valve. Aortic  valve regurgitation is not visualized. No aortic stenosis is  present.  There is a 23 mm Edwards valve present in the aortic position. Echo  findings are consistent with normal  structure and function of the aortic valve prosthesis. Mean gradient of 8  mm Hg, Peak gradient 13 mm Hg, DVI 0.67 and EOA 4.5 cm2.   2. Left ventricular ejection fraction, by estimation, is 70 to 75%. The  left ventricle has hyperdynamic function. Left ventricular endocardial  border not optimally defined to evaluate regional wall motion. There is  moderate concentric left ventricular  hypertrophy. Left ventricular diastolic parameters are consistent with  Grade I diastolic dysfunction (impaired relaxation).   3. Right ventricular systolic function is normal. The right ventricular  size is not well visualized.   4. MVA 4 cm2 by 2D Planimetry. The mitral valve is degenerative. Trivial  mitral valve regurgitation. The mean mitral valve gradient is 5.0 mmHg.  Severe mitral annular calcification.   5. Aortic Normal Ascending Aorta.   Comparison(s): Prior images reviewed side by side. Trivial PVL no longer  visualized.     EKG:  EKG is  ordered today.  The ekg ordered today demonstrates sinus rhythm left axis deviation 73 prior inferior infarct anterior septal infarct pattern.  Prior EKG reviewed, no changes.  Recent Labs: 10/06/2022: ALT 21 10/18/2022: BUN 38; Creatinine, Ser 1.08; Hemoglobin 10.9; Magnesium 2.3; Platelets 265; Potassium 4.9; Sodium 133  Recent Lipid Panel No results found for: "CHOL", "TRIG", "HDL", "CHOLHDL", "VLDL", "LDLCALC", "LDLDIRECT"   Risk Assessment/Calculations:              Physical Exam:    VS:  BP (!) 130/50 (BP Location: Left Arm, Patient Position: Sitting, Cuff Size: Normal)   Pulse 73   Ht '5\' 3"'$  (1.6 m)   Wt 131 lb (59.4 kg)   LMP  (LMP Unknown)   BMI 23.21 kg/m     Wt Readings from Last 3 Encounters:  10/24/22 131 lb (59.4 kg)  10/18/22 130 lb 12.8 oz (59.3 kg)  10/11/22 133 lb (60.3 kg)     GEN:  Well nourished, well  developed in no acute distress HEENT: Normal NECK: No JVD; No carotid bruits LYMPHATICS: No lymphadenopathy CARDIAC: RRR, 1/6 systolic murmur no rubs,  gallops RESPIRATORY:  Clear to auscultation without rales, wheezing or rhonchi  ABDOMEN: Soft, non-tender, non-distended MUSCULOSKELETAL: 2+ lower extremity edema; No deformity  SKIN: Warm and dry NEUROLOGIC:  Alert and oriented x 3 PSYCHIATRIC:  Normal affect   ASSESSMENT:    1. S/P TAVR (transcatheter aortic valve replacement)   2. Vision changes    PLAN:    In order of problems listed above:  Severe stenosis status post TAVR - As scribed above.  Echocardiogram reassuring.  Doing well.  Dental prophylaxis.  Appreciate TAVR team.  Continuing with aspirin 81 mg.  Lifelong dental prophylaxis.  Preop hip - Dr. Wynelle Link.  Planning on hip surgery in January.  This seems reasonable.  She is able to proceed from a cardiac perspective.  Visual changes status post TAVR with history of macular degeneration - Has had CT planned.  They questioned if this should be done.  I do encourage that she move forward with this to make sure that there are no large defects observed.  She does state that there has been some mild improvement with the symptoms.  Thankfully she is moving in the right direction.  Lower extremity edema - I am going to increase her baseline furosemide 40 mg daily up from 20 mg daily.  I will also ask her to take an additional 40 mg to make twice daily for 3 days to help with her lower extremity edema currently.  Once again maintenance dose will now be 40 from 20.   \We will see her back in 1 month and check basic metabolic profile at that time.   Medication Adjustments/Labs and Tests Ordered: Current medicines are reviewed at length with the patient today.  Concerns regarding medicines are outlined above.  Orders Placed This Encounter  Procedures   Basic metabolic panel   EKG 76-BHAL   Meds ordered this encounter   Medications   furosemide (LASIX) 40 MG tablet    Sig: Take 1 tablet (40 mg total) by mouth 2 (two) times daily for 3 days, THEN 1 tablet (40 mg total) daily.    Dispense:  90 tablet    Refill:  3    Patient Instructions  Medication Instructions:  Your physician has recommended you make the following change in your medication:  INCREASE: Furosemide (Lasix) to 40 mg by mouth twice daily for 3 DAYS  THEN take furosemide 40 mg daily  *If you need a refill on your cardiac medications before your next appointment, please call your pharmacy*   Lab Work: IN 1 MONTH: BMET  If you have labs (blood work) drawn today and your tests are completely normal, you will receive your results only by: [place] (if you have MyChart) OR A paper copy in the mail If you have any lab test that is abnormal or we need to change your treatment, we will call you to review the results.   Testing/Procedures: NONE   Follow-Up: At [facility], you and your health needs are our priority.  As part of our continuing mission to provide you with exceptional heart care, we have created designated Provider Care Teams.  These Care Teams include your primary Cardiologist (physician) and Advanced Practice Providers (APPs -  Physician Assistants and Nurse Practitioners) who all work together to provide you with the care you need, when you need it.  We recommend signing up for the patient portal called "MyChart".  Sign up information is provided on this After Visit Summary.  MyChart is used to  connect with patients for Virtual Visits (Telemedicine).  Patients are able to view lab/test results, encounter notes, upcoming appointments, etc.  Non-urgent messages can be sent to your provider as well.   To learn more about what you can do with MyChart, go to [email or web address].    Your next appointment:   1 month(s)  The format for your next appointment:   In Person  Provider:   Candee Furbish, MD  or   Kathyrn Drown, [place]        Important Information About Sugar         Signed, Candee Furbish, MD  10/24/2022 11:05 AM    [place]

## 2022-10-24 NOTE — Patient Instructions (Signed)
Medication Instructions:  Your physician has recommended you make the following change in your medication:  INCREASE: Furosemide (Lasix) to 40 mg by mouth twice daily for 3 DAYS  THEN take furosemide 40 mg daily  *If you need a refill on your cardiac medications before your next appointment, please call your pharmacy*   Lab Work: IN 1 MONTH: BMET  If you have labs (blood work) drawn today and your tests are completely normal, you will receive your results only by: [place] (if you have MyChart) OR A paper copy in the mail If you have any lab test that is abnormal or we need to change your treatment, we will call you to review the results.   Testing/Procedures: NONE   Follow-Up: At [facility], you and your health needs are our priority.  As part of our continuing mission to provide you with exceptional heart care, we have created designated Provider Care Teams.  These Care Teams include your primary Cardiologist (physician) and Advanced Practice Providers (APPs -  Physician Assistants and Nurse Practitioners) who all work together to provide you with the care you need, when you need it.  We recommend signing up for the patient portal called "MyChart".  Sign up information is provided on this After Visit Summary.  MyChart is used to connect with patients for Virtual Visits (Telemedicine).  Patients are able to view lab/test results, encounter notes, upcoming appointments, etc.  Non-urgent messages can be sent to your provider as well.   To learn more about what you can do with MyChart, go to [email or web address].    Your next appointment:   1 month(s)  The format for your next appointment:   In Person  Provider:   Candee Furbish, MD  or  Kathyrn Drown, [place]        Important Information About Sugar

## 2022-10-27 ENCOUNTER — Ambulatory Visit (HOSPITAL_COMMUNITY)
Admission: RE | Admit: 2022-10-27 | Discharge: 2022-10-27 | Disposition: A | Discharge status: Home / Self Care | Payer: Medicare Other | Source: Ambulatory Visit | Attending: Cardiology | Admitting: Cardiology

## 2022-10-27 ENCOUNTER — Telehealth: Payer: Self-pay

## 2022-10-27 DIAGNOSIS — Z952 Presence of prosthetic heart valve: Secondary | ICD-10-CM | POA: Insufficient documentation

## 2022-10-27 DIAGNOSIS — H538 Other visual disturbances: Secondary | ICD-10-CM | POA: Diagnosis not present

## 2022-10-27 DIAGNOSIS — H532 Diplopia: Secondary | ICD-10-CM | POA: Insufficient documentation

## 2022-10-27 DIAGNOSIS — G319 Degenerative disease of nervous system, unspecified: Secondary | ICD-10-CM | POA: Diagnosis not present

## 2022-10-27 NOTE — Telephone Encounter (Signed)
I have read Dr. Marlou Porch ov notes 10/24/22, which he has cleared the pt. However, I will send notes to MD to update ov notes with recommendations to hold ASA. Once recommendations in place I will be happy to fax notes to the surgeon office.

## 2022-10-27 NOTE — Telephone Encounter (Signed)
-----   Message from Tommie Raymond, NP sent at 10/27/2022  3:54 PM EST ----- Please let the patient know that her CT scan is normal with no acute findings to explain her symptoms. Great news! Sounds like her symptoms were actually improving when she saw Dr. Marlou Porch.

## 2022-10-27 NOTE — Telephone Encounter (Signed)
Patient notified of CT results. No questions or concerns at this time.

## 2022-10-30 DIAGNOSIS — R928 Other abnormal and inconclusive findings on diagnostic imaging of breast: Secondary | ICD-10-CM | POA: Diagnosis not present

## 2022-10-30 DIAGNOSIS — I2510 Atherosclerotic heart disease of native coronary artery without angina pectoris: Secondary | ICD-10-CM | POA: Diagnosis not present

## 2022-10-30 DIAGNOSIS — E1122 Type 2 diabetes mellitus with diabetic chronic kidney disease: Secondary | ICD-10-CM | POA: Diagnosis not present

## 2022-10-30 DIAGNOSIS — I10 Essential (primary) hypertension: Secondary | ICD-10-CM | POA: Diagnosis not present

## 2022-10-30 DIAGNOSIS — N1831 Chronic kidney disease, stage 3a: Secondary | ICD-10-CM | POA: Diagnosis not present

## 2022-10-30 DIAGNOSIS — E7800 Pure hypercholesterolemia, unspecified: Secondary | ICD-10-CM | POA: Diagnosis not present

## 2022-11-05 ENCOUNTER — Other Ambulatory Visit: Payer: Self-pay

## 2022-11-05 ENCOUNTER — Observation Stay (HOSPITAL_COMMUNITY): Payer: Medicare Other

## 2022-11-05 ENCOUNTER — Inpatient Hospital Stay (HOSPITAL_COMMUNITY)
Admission: EM | Admit: 2022-11-05 | Discharge: 2022-11-07 | DRG: 378 | Disposition: A | Discharge status: Home Health | Payer: Medicare Other | Attending: Internal Medicine | Admitting: Internal Medicine

## 2022-11-05 DIAGNOSIS — K219 Gastro-esophageal reflux disease without esophagitis: Secondary | ICD-10-CM | POA: Diagnosis present

## 2022-11-05 DIAGNOSIS — K2981 Duodenitis with bleeding: Secondary | ICD-10-CM | POA: Diagnosis present

## 2022-11-05 DIAGNOSIS — R0602 Shortness of breath: Secondary | ICD-10-CM | POA: Diagnosis not present

## 2022-11-05 DIAGNOSIS — I5032 Chronic diastolic (congestive) heart failure: Secondary | ICD-10-CM | POA: Diagnosis present

## 2022-11-05 DIAGNOSIS — R42 Dizziness and giddiness: Secondary | ICD-10-CM | POA: Diagnosis not present

## 2022-11-05 DIAGNOSIS — E860 Dehydration: Secondary | ICD-10-CM | POA: Diagnosis present

## 2022-11-05 DIAGNOSIS — D649 Anemia, unspecified: Secondary | ICD-10-CM | POA: Diagnosis not present

## 2022-11-05 DIAGNOSIS — D631 Anemia in chronic kidney disease: Secondary | ICD-10-CM | POA: Diagnosis not present

## 2022-11-05 DIAGNOSIS — M810 Age-related osteoporosis without current pathological fracture: Secondary | ICD-10-CM | POA: Diagnosis present

## 2022-11-05 DIAGNOSIS — Z880 Allergy status to penicillin: Secondary | ICD-10-CM

## 2022-11-05 DIAGNOSIS — I10 Essential (primary) hypertension: Secondary | ICD-10-CM | POA: Diagnosis present

## 2022-11-05 DIAGNOSIS — Z952 Presence of prosthetic heart valve: Secondary | ICD-10-CM

## 2022-11-05 DIAGNOSIS — Z882 Allergy status to sulfonamides status: Secondary | ICD-10-CM

## 2022-11-05 DIAGNOSIS — E1122 Type 2 diabetes mellitus with diabetic chronic kidney disease: Secondary | ICD-10-CM | POA: Diagnosis not present

## 2022-11-05 DIAGNOSIS — I130 Hypertensive heart and chronic kidney disease with heart failure and stage 1 through stage 4 chronic kidney disease, or unspecified chronic kidney disease: Secondary | ICD-10-CM | POA: Diagnosis present

## 2022-11-05 DIAGNOSIS — N1831 Chronic kidney disease, stage 3a: Secondary | ICD-10-CM | POA: Diagnosis present

## 2022-11-05 DIAGNOSIS — C50919 Malignant neoplasm of unspecified site of unspecified female breast: Secondary | ICD-10-CM | POA: Diagnosis present

## 2022-11-05 DIAGNOSIS — Z885 Allergy status to narcotic agent status: Secondary | ICD-10-CM

## 2022-11-05 DIAGNOSIS — Z8249 Family history of ischemic heart disease and other diseases of the circulatory system: Secondary | ICD-10-CM

## 2022-11-05 DIAGNOSIS — I350 Nonrheumatic aortic (valve) stenosis: Secondary | ICD-10-CM | POA: Diagnosis not present

## 2022-11-05 DIAGNOSIS — I252 Old myocardial infarction: Secondary | ICD-10-CM

## 2022-11-05 DIAGNOSIS — D500 Iron deficiency anemia secondary to blood loss (chronic): Secondary | ICD-10-CM | POA: Diagnosis present

## 2022-11-05 DIAGNOSIS — R197 Diarrhea, unspecified: Secondary | ICD-10-CM | POA: Diagnosis not present

## 2022-11-05 DIAGNOSIS — E871 Hypo-osmolality and hyponatremia: Secondary | ICD-10-CM | POA: Diagnosis not present

## 2022-11-05 DIAGNOSIS — K2971 Gastritis, unspecified, with bleeding: Principal | ICD-10-CM | POA: Diagnosis present

## 2022-11-05 DIAGNOSIS — D6959 Other secondary thrombocytopenia: Secondary | ICD-10-CM | POA: Diagnosis present

## 2022-11-05 DIAGNOSIS — Z79899 Other long term (current) drug therapy: Secondary | ICD-10-CM

## 2022-11-05 DIAGNOSIS — Z96651 Presence of right artificial knee joint: Secondary | ICD-10-CM | POA: Diagnosis present

## 2022-11-05 DIAGNOSIS — Z9221 Personal history of antineoplastic chemotherapy: Secondary | ICD-10-CM

## 2022-11-05 DIAGNOSIS — K2970 Gastritis, unspecified, without bleeding: Secondary | ICD-10-CM | POA: Diagnosis not present

## 2022-11-05 DIAGNOSIS — K2980 Duodenitis without bleeding: Secondary | ICD-10-CM | POA: Diagnosis not present

## 2022-11-05 DIAGNOSIS — Z8049 Family history of malignant neoplasm of other genital organs: Secondary | ICD-10-CM | POA: Diagnosis not present

## 2022-11-05 DIAGNOSIS — K2990 Gastroduodenitis, unspecified, without bleeding: Secondary | ICD-10-CM | POA: Diagnosis not present

## 2022-11-05 DIAGNOSIS — K2950 Unspecified chronic gastritis without bleeding: Secondary | ICD-10-CM | POA: Diagnosis not present

## 2022-11-05 DIAGNOSIS — Z87891 Personal history of nicotine dependence: Secondary | ICD-10-CM

## 2022-11-05 DIAGNOSIS — Z91018 Allergy to other foods: Secondary | ICD-10-CM

## 2022-11-05 DIAGNOSIS — Z90710 Acquired absence of both cervix and uterus: Secondary | ICD-10-CM | POA: Diagnosis not present

## 2022-11-05 DIAGNOSIS — I2510 Atherosclerotic heart disease of native coronary artery without angina pectoris: Secondary | ICD-10-CM | POA: Diagnosis not present

## 2022-11-05 DIAGNOSIS — Z881 Allergy status to other antibiotic agents status: Secondary | ICD-10-CM

## 2022-11-05 DIAGNOSIS — Z7982 Long term (current) use of aspirin: Secondary | ICD-10-CM

## 2022-11-05 DIAGNOSIS — E7800 Pure hypercholesterolemia, unspecified: Secondary | ICD-10-CM | POA: Diagnosis present

## 2022-11-05 DIAGNOSIS — I2583 Coronary atherosclerosis due to lipid rich plaque: Secondary | ICD-10-CM | POA: Diagnosis not present

## 2022-11-05 DIAGNOSIS — N189 Chronic kidney disease, unspecified: Secondary | ICD-10-CM | POA: Diagnosis not present

## 2022-11-05 DIAGNOSIS — E739 Lactose intolerance, unspecified: Secondary | ICD-10-CM | POA: Diagnosis present

## 2022-11-05 DIAGNOSIS — Z9102 Food additives allergy status: Secondary | ICD-10-CM

## 2022-11-05 DIAGNOSIS — Z853 Personal history of malignant neoplasm of breast: Secondary | ICD-10-CM

## 2022-11-05 DIAGNOSIS — R001 Bradycardia, unspecified: Secondary | ICD-10-CM | POA: Diagnosis not present

## 2022-11-05 DIAGNOSIS — D539 Nutritional anemia, unspecified: Secondary | ICD-10-CM | POA: Diagnosis present

## 2022-11-05 DIAGNOSIS — I129 Hypertensive chronic kidney disease with stage 1 through stage 4 chronic kidney disease, or unspecified chronic kidney disease: Secondary | ICD-10-CM | POA: Diagnosis not present

## 2022-11-05 DIAGNOSIS — R195 Other fecal abnormalities: Secondary | ICD-10-CM | POA: Diagnosis not present

## 2022-11-05 DIAGNOSIS — R112 Nausea with vomiting, unspecified: Secondary | ICD-10-CM | POA: Diagnosis not present

## 2022-11-05 LAB — COMPREHENSIVE METABOLIC PANEL
ALT: 13 U/L (ref 0–44)
AST: 26 U/L (ref 15–41)
Albumin: 3.1 g/dL — ABNORMAL LOW (ref 3.5–5.0)
Alkaline Phosphatase: 71 U/L (ref 38–126)
Anion gap: 11 (ref 5–15)
BUN: 22 mg/dL (ref 8–23)
CO2: 17 mmol/L — ABNORMAL LOW (ref 22–32)
Calcium: 8 mg/dL — ABNORMAL LOW (ref 8.9–10.3)
Chloride: 104 mmol/L (ref 98–111)
Creatinine, Ser: 0.89 mg/dL (ref 0.44–1.00)
GFR, Estimated: >60 mL/min (ref >60)
Glucose, Bld: 95 mg/dL (ref 70–99)
Potassium: 4.6 mmol/L (ref 3.5–5.1)
Sodium: 132 mmol/L — ABNORMAL LOW (ref 135–145)
Total Bilirubin: 1.3 mg/dL — ABNORMAL HIGH (ref 0.3–1.2)
Total Protein: 5.7 g/dL — ABNORMAL LOW (ref 6.5–8.1)

## 2022-11-05 LAB — BRAIN NATRIURETIC PEPTIDE: B Natriuretic Peptide: 79.5 pg/mL (ref 0.0–100.0)

## 2022-11-05 LAB — CBC WITH DIFFERENTIAL/PLATELET
Abs Immature Granulocytes: 0.03 10*3/uL (ref 0.00–0.07)
Basophils Absolute: 0 10*3/uL (ref 0.0–0.1)
Basophils Relative: 0 %
Eosinophils Absolute: 0 10*3/uL (ref 0.0–0.5)
Eosinophils Relative: 0 %
HCT: 24.5 % — ABNORMAL LOW (ref 36.0–46.0)
Hemoglobin: 7.3 g/dL — ABNORMAL LOW (ref 12.0–15.0)
Immature Granulocytes: 0 %
Lymphocytes Relative: 7 %
Lymphs Abs: 0.6 10*3/uL — ABNORMAL LOW (ref 0.7–4.0)
MCH: 30.9 pg (ref 26.0–34.0)
MCHC: 29.8 g/dL — ABNORMAL LOW (ref 30.0–36.0)
MCV: 103.8 fL — ABNORMAL HIGH (ref 80.0–100.0)
Monocytes Absolute: 0.8 10*3/uL (ref 0.1–1.0)
Monocytes Relative: 10 %
Neutro Abs: 6.8 10*3/uL (ref 1.7–7.7)
Neutrophils Relative %: 83 %
Platelets: 127 10*3/uL — ABNORMAL LOW (ref 150–400)
RBC: 2.36 MIL/uL — ABNORMAL LOW (ref 3.87–5.11)
RDW: 13.6 % (ref 11.5–15.5)
WBC: 8.2 10*3/uL (ref 4.0–10.5)
nRBC: 0 % (ref 0.0–0.2)

## 2022-11-05 LAB — RETICULOCYTES
Immature Retic Fract: 9.8 % (ref 2.3–15.9)
RBC.: 3.08 MIL/uL — ABNORMAL LOW (ref 3.87–5.11)
Retic Count, Absolute: 40.3 10*3/uL (ref 19.0–186.0)
Retic Ct Pct: 1.3 % (ref 0.4–3.1)

## 2022-11-05 LAB — POC OCCULT BLOOD, ED: Fecal Occult Bld: POSITIVE — AB

## 2022-11-05 LAB — MAGNESIUM: Magnesium: 1.4 mg/dL — ABNORMAL LOW (ref 1.7–2.4)

## 2022-11-05 MED ORDER — MAGNESIUM SULFATE 2 GM/50ML IV SOLN
2.0000 g | Freq: Once | INTRAVENOUS | Status: AC
  Administered 2022-11-05: 2 g via INTRAVENOUS
  Filled 2022-11-05: qty 50

## 2022-11-05 MED ORDER — SODIUM CHLORIDE 0.9% IV SOLUTION: Freq: Once | INTRAVENOUS | Status: AC

## 2022-11-05 MED ORDER — PANTOPRAZOLE SODIUM 40 MG IV SOLR
40.0000 mg | Freq: Two times a day (BID) | INTRAVENOUS | Status: DC
  Administered 2022-11-05 – 2022-11-06 (×3): 40 mg via INTRAVENOUS
  Filled 2022-11-05 (×4): qty 10

## 2022-11-05 NOTE — Assessment & Plan Note (Signed)
Allow permissive hypertension for tonight 

## 2022-11-05 NOTE — Assessment & Plan Note (Signed)
Chronic stable hold aspirin given Hemoccult positive stools

## 2022-11-05 NOTE — H&P (Signed)
Barbara Russell QHU:[id number] DOB: [birth year] DOA: 11/05/2022     PCP: Seward Carol, MD   Outpatient Specialists:   CARDS:  Dr. Marlou Porch    Patient arrived to ER on 11/05/22 at 1917 Referred by Attending Varney Biles, MD   Patient coming from:     From facility [facility]  Chief Complaint:   Chief Complaint  Patient presents with   Diarrhea   Emesis    HPI: Barbara Russell is a 88 y.o. female with medical history significant of C.Diff, CAD, sp TAVR, history of breast cancer status postmastectomy, CKD, diverticulitis, GERD, HLD, HTN, osteoporosis, chronic diastolic CHF, history of macular degeneration chronic hyponatremia    Presented with lightheadedness Patient comes in from [facility] independent living complaining of nausea vomiting diarrhea for the past 3 days has had history of C. difficile several months ago on arrival by EMS blood pressure 124/54 heart rate 84 Noted to be orthostatic in the emergency department feeling very weak with stands up feels dehydrated. Diarrhea has been getting worse decreased p.o. intake cramping abdominal pain patient is unsure what her stools looking like is secondary to visual impairment.  No recent antibiotic use   Of note patient has been seen by cardiology on 14 November noted to have lower extremity edema and her Lasix was increased to 40 mg daily up from 20   Regarding pertinent Chronic problems:       HTN on Imdur, spironolactone.  Losartan Hydralazine as needed  chronic CHF diastolic/ -EF 46% grade 1 diastolic dysfunction    CAD  - On Aspirin,                  -  followed by cardiology                 CKD stage II - baseline Cr 1.0 Estimated Creatinine Clearance: 34.8 mL/min (by C-G formula based on SCr of 0.89 mg/dL).  Lab Results  Component Value Date   CREATININE 0.89 11/05/2022   CREATININE 1.08 (H) 10/18/2022   CREATININE 0.78 10/11/2022    Chronic anemia - baseline hg Hemoglobin & Hematocrit  Recent  Labs    10/11/22 0137 10/18/22 1402 11/05/22 1953  HGB 9.6* 10.9* 7.3*     While in ER:   Noted to have hemoglobin down to 7.3 from 10.9 Hemoccult positive    CXR ordered Following Medications were ordered in ER: Medications  magnesium sulfate IVPB 2 g 50 mL (0 g Intravenous Stopped 11/05/22 2243)    ________________    ED Triage Vitals  Enc Vitals Group     BP 11/05/22 1935 (!) 125/56     Pulse Rate 11/05/22 1935 77     Resp 11/05/22 1935 16     Temp 11/05/22 1944 97.7 F (36.5 C)     Temp src --      SpO2 11/05/22 1935 99 %     Weight --      Height --      Head Circumference --      Peak Flow --      Pain Score 11/05/22 1940 2     Pain Loc --      Pain Edu? --      Excl. in Train[place]? --   TMAX(24)@     _________________________________________ Significant initial  Findings: Abnormal Labs Reviewed  COMPREHENSIVE METABOLIC PANEL - Abnormal; Notable for the following components:      Result Value   Sodium 132 (*)  CO2 17 (*)    Calcium 8.0 (*)    Total Protein 5.7 (*)    Albumin 3.1 (*)    Total Bilirubin 1.3 (*)    All other components within normal limits  CBC WITH DIFFERENTIAL/PLATELET - Abnormal; Notable for the following components:   RBC 2.36 (*)    Hemoglobin 7.3 (*)    HCT 24.5 (*)    MCV 103.8 (*)    MCHC 29.8 (*)    Platelets 127 (*)    Lymphs Abs 0.6 (*)    All other components within normal limits  MAGNESIUM - Abnormal; Notable for the following components:   Magnesium 1.4 (*)    All other components within normal limits  POC OCCULT BLOOD, ED - Abnormal; Notable for the following components:   Fecal Occult Bld POSITIVE (*)    All other components within normal limits   ______________________ Troponin  ordered ECG: Ordered Personally reviewed and interpreted by me showing: HR : 75 Rhythm:Sinus rhythm Inferior infarct, old Extensive anterior infarct, old No acute changes No significant change since last tracing QTC 432     The recent  clinical data is shown below. Vitals:   11/05/22 2030 11/05/22 2130 11/05/22 2230 11/05/22 2245  BP: (!) 131/53 (!) 121/51 (!) 113/57   Pulse: 82 78 77 78  Resp: 15 15 (!) 22 19  Temp:      SpO2: 99% 97% 95% 95%    WBC     Component Value Date/Time   WBC 8.2 11/05/2022 1953   LYMPHSABS 0.6 (L) 11/05/2022 1953   MONOABS 0.8 11/05/2022 1953   EOSABS 0.0 11/05/2022 1953   BASOSABS 0.0 11/05/2022 1953      Lactic Acid, Venous No results found for: "LATICACIDVEN"      ___________________________________ Hospitalist was called for admission for   Symptomatic anemia  Diarrhea, unspecified type  The following Work up has been ordered so far:  Orders Placed This Encounter  Procedures   Gastrointestinal Panel by PCR , Stool   Comprehensive metabolic panel   CBC with Differential   Magnesium   Brain natriuretic peptide   Vitamin B12   Folate   Iron and TIBC   Ferritin   Reticulocytes   Orthostatic vital signs   Consult to hospitalist   Enteric precautions (UV disinfection) C difficile, Norovirus   POC occult blood, ED   POC occult blood, ED   EKG 12-Lead   Type and screen [place] initial  Findings:  labs showing:  Recent Labs  Lab 11/05/22 1953  NA 132*  K 4.6  CO2 17*  GLUCOSE 95  BUN 22  CREATININE 0.89  CALCIUM 8.0*  MG 1.4*    Cr   stable,    Lab Results  Component Value Date   CREATININE 0.89 11/05/2022   CREATININE 1.08 (H) 10/18/2022   CREATININE 0.78 10/11/2022    Recent Labs  Lab 11/05/22 1953  AST 26  ALT 13  ALKPHOS 71  BILITOT 1.3*  PROT 5.7*  ALBUMIN 3.1*   Lab Results  Component Value Date   CALCIUM 8.0 (L) 11/05/2022    Plt: Lab Results  Component Value Date   PLT 127 (L) 11/05/2022     Recent Labs  Lab 11/05/22 1953  WBC 8.2  NEUTROABS 6.8  HGB 7.3*  HCT 24.5*  MCV 103.8*  PLT 127*    HG/HCT  stable,     Component Value Date/Time  HGB 7.3 (L) 11/05/2022 1953    HGB 10.9 (L) 10/18/2022 1402   HCT 24.5 (L) 11/05/2022 1953   HCT 31.9 (L) 10/18/2022 1402   MCV 103.8 (H) 11/05/2022 1953   MCV 93 10/18/2022 1402        Component Value Date/Time   SDES  03/18/2022 1008    URINE, CLEAN CATCH Performed at [facility], [address] [place], [place] [address]    [facility]  03/18/2022 1008    NONE Performed at [facility], [address] [place], [place] [address]    CULT 80,000 [place] (A) 03/18/2022 1008   REPTSTATUS 03/20/2022 FINAL 03/18/2022 1008     Radiological Exams on Admission: No results found. _______________________________________________________________________________________________________ Latest  Blood pressure (!) 113/57, pulse 78, temperature 97.7 F (36.5 C), resp. rate 19, SpO2 95 %.   Vitals  labs and radiology finding personally reviewed  Review of Systems:    Pertinent positives include:  fatigue, dyspnea on exertion,   Constitutional:  No weight loss, night sweats, Fevers, chills, weight loss  HEENT:  No headaches, Difficulty swallowing,Tooth/dental problems,Sore throat,  No sneezing, itching, ear ache, nasal congestion, post nasal drip,  Cardio-vascular:  No chest pain, Orthopnea, PND, anasarca, dizziness, palpitations.no Bilateral lower extremity swelling  GI:  No heartburn, indigestion, abdominal pain, nausea, vomiting, diarrhea, change in bowel habits, loss of appetite, melena, blood in stool, hematemesis Resp:  no shortness of breath at rest. No No excess mucus, no productive cough, No non-productive cough, No coughing up of blood.No change in color of mucus.No wheezing. Skin:  no rash or lesions. No jaundice GU:  no dysuria, change in color of urine, no urgency or frequency. No straining to urinate.  No flank pain.  Musculoskeletal:  No joint pain or no joint swelling. No decreased range of motion. No back pain.  Psych:  No change in  mood or affect. No depression or anxiety. No memory loss.  Neuro: no localizing neurological complaints, no tingling, no weakness, no double vision, no gait abnormality, no slurred speech, no confusion  All systems reviewed and apart from Geraldine all are negative _______________________________________________________________________________________________ Past Medical History:   Past Medical History:  Diagnosis Date   Anxiety    Breast cancer ([place])    Hx of BC, s/p mastectomy on right 1999, s/p tamoxifen X1 year   Chest pain    EC Stress test 12/21/11 Low risk, no ischemia, normal EF   Chronic kidney disease    Coronary artery disease    Remote Hx of CAD, normal stress test 2009   Diverticulitis    GERD (gastroesophageal reflux disease)    No longer a problem   Hypercholesteremia    Hypertension    Insomnia    Lichen sclerosus    Low back pain    Myocardial infarction ([place]) 12/11/1998   Osteoarthrosis, unspecified whether generalized or localized, unspecified site    Pain    Several year Hx of pain/ SOB radiating to her neck   S/P TAVR (transcatheter aortic valve replacement) 10/10/2022   s/p TAVR with a 68m Edwards S3UR via the TF approach by Dr. MAngelena Form& Dr. ETenny Craw  Severe aortic stenosis    UTI (urinary tract infection)     Past Surgical History:  Procedure Laterality Date   ABDOMINAL HYSTERECTOMY     APPENDECTOMY     HAND SURGERY Bilateral    CTS and trigger finger on right and left   INTRAOPERATIVE TRANSTHORACIC ECHOCARDIOGRAM N/A 10/10/2022  Procedure: INTRAOPERATIVE TRANSTHORACIC ECHOCARDIOGRAM;  Surgeon: Burnell Blanks, MD;  Location: [facility];  Service: Open Heart Surgery;  Laterality: N/A;   MASTECTOMY Right 12/11/1997   REPLACEMENT TOTAL KNEE Right 11/10/2009   RIGHT HEART CATH AND CORONARY ANGIOGRAPHY N/A 08/17/2022   Procedure: RIGHT HEART CATH AND CORONARY ANGIOGRAPHY;  Surgeon: Burnell Blanks, MD;  Location: [facility];  Service:  Cardiovascular;  Laterality: N/A;   TOTAL KNEE REVISION Right 05/17/2022   Procedure: Right knee polyethylene revision;  Surgeon: Gaynelle Arabian, MD;  Location: [facility];  Service: Orthopedics;  Laterality: Right;   TRANSCATHETER AORTIC VALVE REPLACEMENT, TRANSFEMORAL N/A 10/10/2022   Procedure: Transcatheter Aortic Valve Replacement, Transfemoral;  Surgeon: Burnell Blanks, MD;  Location: [facility];  Service: Open Heart Surgery;  Laterality: N/A;   ULTRASOUND GUIDANCE FOR VASCULAR ACCESS  10/10/2022   Procedure: ULTRASOUND GUIDANCE FOR VASCULAR ACCESS;  Surgeon: Burnell Blanks, MD;  Location: [facility];  Service: Open Heart Surgery;;   VESICOVAGINAL FISTULA CLOSURE W/ TAH  12/12/1967    Social History:  Ambulatory   walker     reports that she quit smoking about 25 years ago. Her smoking use included cigarettes. She smoked an average of 1 pack per day. She has never used smokeless tobacco. She reports that she does not drink alcohol and does not use drugs.   Family History:  Family History  Problem Relation Age of Onset   Cancer Mother        cervical   Heart disease Father    ______________________________________________________________________________________________ Allergies: Allergies  Allergen Reactions   Amlodipine Swelling    LEE with '5mg'$  dose  Foot swelling   Benadryl [Diphenhydramine Hcl (Sleep)] Other (See Comments)    Hyperstimulation   Broccoli Flavor [Flavoring Agent] Diarrhea   Brussels Sprouts [Brassica Oleracea] Diarrhea   Cabbage Diarrhea   Ciprofloxacin Hives   Collard Greens [Wild Lettuce Extract (Lactuca Virosa)] Diarrhea   Lactose Intolerance (Gi) Diarrhea   Macrobid [Nitrofurantoin Monohyd Macro] Nausea And Vomiting   Naproxen Other (See Comments)    Stomach pain   Quinine Sulfate [Quinine] Hives   Rosuvastatin     headache   Sulfa Antibiotics Hives        Tramadol Other (See Comments)    unknown to patient   Penicillins Rash    Has  patient had a PCN reaction causing immediate rash, facial/tongue/throat swelling, SOB or lightheadedness with hypotension: Yes Has patient had a PCN reaction causing severe rash involving mucus membranes or skin necrosis: No Has patient had a PCN reaction that required hospitalization: No Has patient had a PCN reaction occurring within the last 10 years: No If all of the above answers are "NO", then may proceed with Cephalosporin use. Tolerated Cephalosporin Date: 05/19/22.     Prednisone Other (See Comments)    Weakness and pain in joints    Prior to Admission medications   Medication Sig Start Date End Date Taking? Authorizing Provider  acetaminophen (TYLENOL) 325 MG tablet Take 650 mg by mouth every 6 (six) hours as needed for moderate pain or mild pain.    [provider]  aspirin 81 MG tablet Take 1 tablet (81 mg total) by mouth daily. 06/07/22   Gaynelle Arabian, MD  b complex vitamins capsule Take 1 capsule by mouth in the morning.    [provider]  Camphor-Menthol-Methyl Sal (SALONPAS) 3.12-16-08 % PTCH Place 1 patch onto the skin daily as needed (pain).    [provider]  cetirizine (ZYRTEC) 10 MG tablet Take 10 mg by mouth daily as needed for allergies or rhinitis.    [provider]  Cholecalciferol (VITAMIN D3 PO) Take 1,000 Units by mouth in the morning.    [provider]  CRANBERRY PO Take 1 tablet by mouth at bedtime.    [provider]  furosemide (LASIX) 40 MG tablet Take 1 tablet (40 mg total) by mouth 2 (two) times daily for 3 days, THEN 1 tablet (40 mg total) daily. 10/24/22 10/19/23  Jerline Pain, MD  gabapentin (NEURONTIN) 300 MG capsule Take 600 mg by mouth at bedtime.     [provider]  hydrALAZINE (APRESOLINE) 25 MG tablet Take 1 tablet (25 mg total) by mouth daily as needed (blood pressure of 160 or above). 06/28/22   Jerline Pain, MD  HYDROcodone-acetaminophen (NORCO) 5-325 MG tablet Take 1 tablet by  mouth every 6 (six) hours as needed. 10/07/22   Veryl Speak, MD  isosorbide mononitrate (IMDUR) 30 MG 24 hr tablet TAKE 1 TABLET BY MOUTH EVERY DAY 08/22/22   Jerline Pain, MD  losartan (COZAAR) 50 MG tablet Take 1 tablet (50 mg total) by mouth 2 (two) times daily. Patient taking differently: Take 50 mg by mouth See admin instructions. Take 1 tablet (50 mg) by mouth scheduled every morning, may repeat dose in the afternoon if blood pressure is elevated. 02/23/22   Jerline Pain, MD  mineral oil-hydrophilic petrolatum (AQUAPHOR) ointment Apply 1 Application topically daily as needed for irritation.    [provider]  Multiple Vitamins-Minerals (PRESERVISION AREDS 2) CAPS Take 1 capsule by mouth 2 (two) times daily.    [provider]  nitroGLYCERIN (NITROSTAT) 0.4 MG SL tablet PLACE 1 TABLET UNDER THE TONGUE EVERY 5 MINUTES AS NEEDED FOR CHEST PAIN. 04/11/22   Jerline Pain, MD  Omega-3 Fatty Acids (OMEGA-3 FISH OIL PO) Take 1,000 mg by mouth every Monday, Tuesday, Wednesday, Thursday, and Friday.    [provider]  ondansetron (ZOFRAN) 4 MG tablet Take 4 mg by mouth every 4 (four) hours as needed for vomiting or nausea.    [provider]  [facility] ULTRA test strip CHECK ONCE DAILY AS DIRECTED 05/29/21   [provider]  Polyethyl Glycol-Propyl Glycol (SYSTANE OP) Place 1 drop into both eyes in the morning and at bedtime.    [provider]  saccharomyces boulardii (FLORASTOR) 250 MG capsule Take 250-500 mg by mouth daily in the afternoon. 12/23/21   [provider]  spironolactone (ALDACTONE) 25 MG tablet Take 1 tablet (25 mg total) by mouth daily. 09/07/22   Jerline Pain, MD  zolpidem (AMBIEN) 5 MG tablet Take 7.5 mg by mouth at bedtime.    [provider]    ___________________________________________________________________________________________________ Physical Exam:    11/05/2022   10:45 PM 11/05/2022   10:30 PM  11/05/2022    9:30 PM  Vitals with BMI  Systolic  287 867  Diastolic  57 51  Pulse 78 77 78    1. General:  in No  Acute distress   Chronically ill   -appearing 2. Psychological: Alert and   Oriented 3. Head/ENT:    pale Dry Mucous Membranes                          Head Non traumatic, neck supple  Poor Dentition 4. SKIN decreased Skin turgor,  Skin clean Dry and intact no rash 5. Heart: Regular rate and rhythm systolic  Murmur, no Rub or gallop 6. Lungs: , no wheezes or crackles   7. Abdomen: Soft,  non-tender, Non distended   bowel sounds present 8. Lower extremities: no clubbing, cyanosis, no  edema 9. Neurologically Grossly intact, moving all 4 extremities equally   10. MSK: Normal range of motion    Chart has been reviewed  ______________________________________________________________________________________________  Assessment/Plan 88 y.o. female with medical history significant of C.Diff, CAD, sp TAVR, history of breast cancer status postmastectomy, CKD, diverticulitis, GERD, HLD, HTN, osteoporosis, chronic diastolic CHF, history of macular degeneration chronic hyponatremia   Admitted for   Symptomatic anemia  Diarrhea, unspecified type    Present on Admission:  Symptomatic anemia  Diarrhea  Coronary artery disease due to lipid rich plaque  Hypertension  Chronic kidney disease, stage 3a (HCC)  Breast cancer (HCC)  Dehydration  Hypomagnesemia  Hyponatremia   Symptomatic anemia Transfuse 1 unit and follow CBC. Sent message to [facility] the patient has been admitted Protonix twice daily 40 mg  Diarrhea Obtain C. difficile, Gastric panel Lactoferrin Appreciate [facility] consult no abdominal pain at this point we will hold off on CT scan  Coronary artery disease due to lipid rich plaque Chronic stable hold aspirin given Hemoccult positive stools  Severe aortic stenosis Status post TAVR  Hypertension Allow permissive hypertension  for tonight  Chronic kidney disease, stage 3a ([place])  -chronic avoid nephrotoxic medications such as NSAIDs, Vanco Zosyn combo,  avoid hypotension, continue to follow renal function   Breast cancer ([place]) Remote in remission  Dehydration Rehydrate and follow fluid status check orthostatics in the morning  Hypomagnesemia Will replace magnesium level check phosphate and other electrolytes replace as needed  Hyponatremia Chronic recurrent Obtain urine electrolytes and check TSH   Other plan as per orders.  DVT prophylaxis:  SCD       Code Status:    Code Status: Prior FULL CODE as per patient   I had personally discussed CODE STATUS with patient   Family Communication:   Family not at  Bedside    Disposition Plan:    Back to current facility when stable                             Following barriers for discharge:                            Electrolytes corrected                               Anemia corrected                                                     Will need consultants to evaluate patient prior to discharge                     Would benefit from PT/OT eval prior to DC  Ordered  Transition of care consulted                   Nutrition    consulted                    Consults called: sent msg to [facility]  Admission status:  ED Disposition     ED Disposition  Admit   Condition  --   [place]: [facility] [[id number]]  Level of Care: Telemetry [5]  Admit to tele based on following criteria: Other see comments  Comments: symptomatic anemia  May place patient in observation at [facility] or [facility] if equivalent level of care is available:: No  Covid Evaluation: Asymptomatic - no recent exposure (last 10 days) testing not required  Diagnosis: Symptomatic anemia [[id number]]  Admitting Physician: Toy Baker [[id number]]  Attending Physician: Toy Baker [[id number]]           Obs     Level of care     tele  For  24H        Shemeca Lukasik 11/05/2022, 11:42 PM    [facility]     after 2 AM please page floor coverage PA If 7AM-7PM, please contact the day team taking care of the patient using [email or web address]   Patient was evaluated in the context of the global COVID-19 pandemic, which necessitated consideration that the patient might be at risk for infection with the SARS-CoV-2 virus that causes COVID-19. Institutional protocols and algorithms that pertain to the evaluation of patients at risk for COVID-19 are in a state of rapid change based on information released by regulatory bodies including the CDC and federal and state organizations. These policies and algorithms were followed during the patient's care.

## 2022-11-05 NOTE — ED Notes (Signed)
Orthostatic VS, pt felt okay when laying and sitting. Pt states that they were very shaky, and extremely weak while standing up. Pt was unable to finish ortho VS due to it.

## 2022-11-05 NOTE — Assessment & Plan Note (Signed)
-  chronic avoid nephrotoxic medications such as NSAIDs, Vanco Zosyn combo,  avoid hypotension, continue to follow renal function  

## 2022-11-05 NOTE — Assessment & Plan Note (Signed)
Status post TAVR

## 2022-11-05 NOTE — Assessment & Plan Note (Signed)
Remote in remission

## 2022-11-05 NOTE — Assessment & Plan Note (Signed)
Obtain C. difficile, Gastric panel Lactoferrin Appreciate [facility] consult no abdominal pain at this point we will hold off on CT scan

## 2022-11-05 NOTE — Subjective & Objective (Signed)
Patient comes in from [facility] independent living complaining of nausea vomiting diarrhea for the past 3 days has had history of C. difficile several months ago on arrival by EMS blood pressure 124/54 heart rate 84 Noted to be orthostatic in the emergency department feeling very weak with stands up feels dehydrated. Diarrhea has been getting worse decreased p.o. intake cramping abdominal pain patient is unsure what her stools looking like is secondary to visual impairment.  No recent antibiotic use

## 2022-11-05 NOTE — Assessment & Plan Note (Signed)
Will replace magnesium level check phosphate and other electrolytes replace as needed

## 2022-11-05 NOTE — Assessment & Plan Note (Signed)
Transfuse 1 unit and follow CBC. Sent message to [facility] the patient has been admitted Protonix twice daily 40 mg

## 2022-11-05 NOTE — ED Provider Notes (Signed)
[place] DEPT Provider Note   CSN: [id number] Arrival date & time: 11/05/22  1917     History  Chief Complaint  Patient presents with   Diarrhea   Emesis    Barbara Russell is a 88 y.o. female.  HPI    88 year old female with history of severe aortic stenosis status post repair earlier in November, diverticulosis, prior history of C. difficile colitis comes in with chief complaint of nausea, vomiting and diarrhea for 3 days.  Patient states that her symptoms started on Thursday evening.  Over time, her diarrhea has worsened.  Now she feels profoundly weak.  She also has dizziness.  Patient has nausea with reduced p.o. intake.  She has cramping abdominal pain prior to the diarrhea.  She cannot see her stools because of vision impairment.  She is unsure if the symptoms are similar to her C. difficile colitis.  She has not been on any antibiotics recently.  Home Medications Prior to Admission medications   Medication Sig Start Date End Date Taking? Authorizing Provider  acetaminophen (TYLENOL) 325 MG tablet Take 650 mg by mouth every 6 (six) hours as needed for moderate pain or mild pain.   Yes [provider]  b complex vitamins capsule Take 1 capsule by mouth in the morning.   Yes [provider]  Camphor-Menthol-Methyl Sal (SALONPAS) 3.12-16-08 % PTCH Place 1 patch onto the skin daily as needed (pain).   Yes [provider]  cetirizine (ZYRTEC) 10 MG tablet Take 10 mg by mouth daily as needed for allergies or rhinitis.   Yes [provider]  Cholecalciferol (VITAMIN D3 PO) Take 1,000 Units by mouth in the morning.   Yes [provider]  CRANBERRY PO Take 1 tablet by mouth at bedtime.   Yes [provider]  furosemide (LASIX) 40 MG tablet Take 1 tablet (40 mg total) by mouth 2 (two) times daily for 3 days, THEN 1 tablet (40 mg total) daily. Patient taking differently: Take 1 tablet (40 mg)  daily   10/24/22 10/19/23 Yes Jerline Pain, MD  gabapentin (NEURONTIN) 300 MG capsule Take 600 mg by mouth at bedtime.    Yes [provider]  HYDROcodone-acetaminophen (NORCO) 5-325 MG tablet Take 1 tablet by mouth every 6 (six) hours as needed. Patient taking differently: Take 1 tablet by mouth every 6 (six) hours as needed for moderate pain or severe pain. 10/07/22  Yes Delo, Nathaneil Canary, MD  isosorbide mononitrate (IMDUR) 30 MG 24 hr tablet TAKE 1 TABLET BY MOUTH EVERY DAY 08/22/22  Yes Jerline Pain, MD  losartan (COZAAR) 50 MG tablet Take 1 tablet (50 mg total) by mouth 2 (two) times daily. Patient taking differently: Take 50 mg by mouth See admin instructions. Take 1 tablet (50 mg) by mouth scheduled every morning, may repeat dose in the afternoon if blood pressure is elevated. 02/23/22  Yes Jerline Pain, MD  Multiple Vitamins-Minerals (PRESERVISION AREDS 2) CAPS Take 1 capsule by mouth 2 (two) times daily.   Yes [provider]  nitroGLYCERIN (NITROSTAT) 0.4 MG SL tablet PLACE 1 TABLET UNDER THE TONGUE EVERY 5 MINUTES AS NEEDED FOR CHEST PAIN. 04/11/22  Yes Jerline Pain, MD  ondansetron (ZOFRAN) 4 MG tablet Take 4 mg by mouth every 4 (four) hours as needed for vomiting or nausea.   Yes [provider]  Polyethyl Glycol-Propyl Glycol (SYSTANE OP) Place 1 drop into both eyes in the morning and at bedtime.  Yes [provider]  saccharomyces boulardii (FLORASTOR) 250 MG capsule Take 500 mg by mouth daily in the afternoon. 12/23/21  Yes [provider]  spironolactone (ALDACTONE) 25 MG tablet Take 1 tablet (25 mg total) by mouth daily. 09/07/22  Yes Jerline Pain, MD  zolpidem (AMBIEN) 5 MG tablet Take 7.5 mg by mouth at bedtime.   Yes [provider]  hydrALAZINE (APRESOLINE) 25 MG tablet Take 1 tablet (25 mg total) by mouth daily as needed (blood pressure of 160 or above). Patient not taking: Reported on 11/05/2022 06/28/22   Jerline Pain,  MD  [facility] ULTRA test strip CHECK ONCE DAILY AS DIRECTED 05/29/21   [provider]  pantoprazole (PROTONIX) 40 MG tablet Take 1 tablet (40 mg total) by mouth 2 (two) times daily. 11/07/22   Dibia, Manfred Shirts, MD      Allergies    Amlodipine, Benadryl [diphenhydramine hcl (sleep)], Broccoli flavor [flavoring agent], Brussels sprouts [brassica oleracea], Cabbage, Ciprofloxacin, Collard greens [wild lettuce extract (lactuca virosa)], Lactose intolerance (gi), Macrobid [nitrofurantoin monohyd macro], Naproxen, Quinine sulfate [quinine], Rosuvastatin, Sulfa antibiotics, Tramadol, Penicillins, and Prednisone    Review of Systems   Review of Systems  All other systems reviewed and are negative.   Physical Exam Updated Vital Signs BP (!) 152/58 (BP Location: Left Arm)   Pulse 72   Temp 98 F (36.7 C) (Oral)   Resp 18   Ht '5\' 3"'$  (1.6 m)   Wt 59.3 kg   LMP  (LMP Unknown)   SpO2 97%   BMI 23.16 kg/m  Physical Exam Vitals and nursing note reviewed.  Constitutional:      Appearance: She is well-developed.  HENT:     Head: Atraumatic.  Cardiovascular:     Rate and Rhythm: Normal rate.  Pulmonary:     Effort: Pulmonary effort is normal.  Abdominal:     Tenderness: There is no abdominal tenderness.  Musculoskeletal:     Cervical back: Normal range of motion and neck supple.  Skin:    General: Skin is warm and dry.  Neurological:     Mental Status: She is alert and oriented to person, place, and time.     ED Results / Procedures / Treatments   Labs (all labs ordered are listed, but only abnormal results are displayed) Labs Reviewed  COMPREHENSIVE METABOLIC PANEL - Abnormal; Notable for the following components:      Result Value   Sodium 132 (*)    CO2 17 (*)    Calcium 8.0 (*)    Total Protein 5.7 (*)    Albumin 3.1 (*)    Total Bilirubin 1.3 (*)    All other components within normal limits  CBC WITH DIFFERENTIAL/PLATELET - Abnormal; Notable for the following  components:   RBC 2.36 (*)    Hemoglobin 7.3 (*)    HCT 24.5 (*)    MCV 103.8 (*)    MCHC 29.8 (*)    Platelets 127 (*)    Lymphs Abs 0.6 (*)    All other components within normal limits  MAGNESIUM - Abnormal; Notable for the following components:   Magnesium 1.4 (*)    All other components within normal limits  RETICULOCYTES - Abnormal; Notable for the following components:   RBC. 3.08 (*)    All other components within normal limits  PREALBUMIN - Abnormal; Notable for the following components:   Prealbumin 16 (*)    All other components within normal limits  COMPREHENSIVE METABOLIC  PANEL - Abnormal; Notable for the following components:   Sodium 132 (*)    BUN 25 (*)    Total Protein 6.1 (*)    Albumin 3.3 (*)    GFR, Estimated 54 (*)    All other components within normal limits  CBC - Abnormal; Notable for the following components:   RBC 3.02 (*)    Hemoglobin 9.6 (*)    HCT 29.5 (*)    All other components within normal limits  CBC - Abnormal; Notable for the following components:   RBC 3.79 (*)    Hemoglobin 11.8 (*)    All other components within normal limits  BASIC METABOLIC PANEL - Abnormal; Notable for the following components:   CO2 21 (*)    Glucose, Bld 102 (*)    All other components within normal limits  POC OCCULT BLOOD, ED - Abnormal; Notable for the following components:   Fecal Occult Bld POSITIVE (*)    All other components within normal limits  TROPONIN I (HIGH SENSITIVITY) - Abnormal; Notable for the following components:   Troponin I (High Sensitivity) 18 (*)    All other components within normal limits  GASTROINTESTINAL PANEL BY PCR, STOOL (REPLACES STOOL CULTURE)  BRAIN NATRIURETIC PEPTIDE  VITAMIN B12  FOLATE  IRON AND TIBC  FERRITIN  LACTIC ACID, PLASMA  LACTIC ACID, PLASMA  CK  PHOSPHORUS  OSMOLALITY  PROTIME-INR  TSH  BLOOD GAS, VENOUS  MAGNESIUM  POC OCCULT BLOOD, ED  TYPE AND SCREEN  PREPARE RBC (CROSSMATCH)  SURGICAL  PATHOLOGY    EKG EKG Interpretation  Date/Time:  Sunday November 05 2022 19:40:59 EST Ventricular Rate:  75 PR Interval:  205 QRS Duration: 101 QT Interval:  386 QTC Calculation: 432 R Axis:   -59 Text Interpretation: Sinus rhythm Inferior infarct, old Extensive anterior infarct, old No acute changes No significant change since last tracing Confirmed by Varney Biles [phone]) on 11/05/2022 10:29:00 PM  Radiology No results found.  Procedures .Critical Care  Performed by: Varney Biles, MD Authorized by: Varney Biles, MD   Critical care provider statement:    Critical care time (minutes):  47   Critical care was necessary to treat or prevent imminent or life-threatening deterioration of the following conditions:  Circulatory failure and metabolic crisis   Critical care was time spent personally by me on the following activities:  Development of treatment plan with patient or surrogate, discussions with consultants, evaluation of patient's response to treatment, examination of patient, ordering and review of laboratory studies, ordering and review of radiographic studies, ordering and performing treatments and interventions, pulse oximetry, re-evaluation of patient's condition and review of old charts     Medications Ordered in ED Medications  0.9 %  sodium chloride infusion (0 mLs Intravenous Stopped 11/07/22 1113)  magnesium sulfate IVPB 2 g 50 mL (0 g Intravenous Stopped 11/05/22 2243)  0.9 %  sodium chloride infusion (Manually program via Guardrails IV Fluids) ( Intravenous New Bag/Given 11/06/22 0117)    ED Course/ Medical Decision Making/ A&P                           Medical Decision Making Amount and/or Complexity of Data Reviewed Labs: ordered.  Risk Prescription drug management. Decision regarding hospitalization.   This patient presents to the ED with chief complaint(s) of n/v/d and weakness with pertinent past medical history of previous cdiff colitis,  recent aortic stenosis surgery .The complaint involves an extensive differential  diagnosis and also carries with it a high risk of complications and morbidity.    The differential diagnosis includes  Symptomatic anemia, severe dehydration, e'lyte abnormality, orthostatic dizziness, COVID-19  The initial plan is to get basic labs, then reassess the patient.   Additional history obtained: Additional history obtained from family -patient's son and daughter at the bedside providing meaningful history.  Patient currently resides at independent living. Records reviewed previous admission documents  Independent labs interpretation:  The following labs were independently interpreted: Patient is noted to have anemia with hemoglobin just above 7.  Hemoglobin is 3 g below her baseline, that was completed just a month ago when she had her aortic valve replacement surgery.  Electrolytes are overall reassuring besides low magnesium.  Independent visualization and interpretation of imaging: - I independently visualized the following imaging with scope of interpretation limited to determining acute life threatening conditions related to emergency care: Chest x-ray, which revealed no evidence of pneumonia  Treatment and Reassessment: Patient reassessed.  She still does not have abdominal pain.  Hemoccult exam is positive, but patient had no stool in the rectal wall to rule out melena.  We will admit her to medicine service. CT scan of the abdomen consider, but upon initial exam and repeat assessment patient does not have severe abdominal pain.  Patient states that she was having cramping pain earlier.  Low suspicion for mesenteric ischemia, but given the recent cardiovascular procedure and the embolic risk that is also a possibility.  White count is normal.  Stool studies are pending.  Will defer transfusion to admitting service as the hemoglobin is above 7.   Final Clinical Impression(s) / ED  Diagnoses Final diagnoses:  Symptomatic anemia  Diarrhea, unspecified type    Rx / DC Orders ED Discharge Orders          Ordered    pantoprazole (PROTONIX) 40 MG tablet  2 times daily        11/07/22 1104              Varney Biles, MD 11/08/22 [phone]

## 2022-11-05 NOTE — Assessment & Plan Note (Addendum)
Chronic recurrent Obtain urine electrolytes and check TSH

## 2022-11-05 NOTE — ED Triage Notes (Signed)
BIB [facility] from [facility] for N/V/D for 3 days, no medications have been helping. Hx of C-Diff several months ago. Pt took Zofran PTA 124/54 BP 22g left hand 137m NS 117 cbg 84 HR

## 2022-11-05 NOTE — Assessment & Plan Note (Signed)
Rehydrate and follow fluid status check orthostatics in the morning

## 2022-11-06 ENCOUNTER — Observation Stay (HOSPITAL_COMMUNITY): Payer: Medicare Other | Admitting: Anesthesiology

## 2022-11-06 ENCOUNTER — Observation Stay (HOSPITAL_BASED_OUTPATIENT_CLINIC_OR_DEPARTMENT_OTHER): Payer: Medicare Other | Admitting: Anesthesiology

## 2022-11-06 ENCOUNTER — Encounter (HOSPITAL_COMMUNITY)
Admission: EM | Disposition: A | Discharge status: Home Health | Payer: Self-pay | Source: Home / Self Care | Attending: Internal Medicine

## 2022-11-06 ENCOUNTER — Encounter (HOSPITAL_COMMUNITY): Payer: Self-pay | Admitting: Internal Medicine

## 2022-11-06 DIAGNOSIS — K2971 Gastritis, unspecified, with bleeding: Secondary | ICD-10-CM | POA: Diagnosis present

## 2022-11-06 DIAGNOSIS — N1831 Chronic kidney disease, stage 3a: Secondary | ICD-10-CM

## 2022-11-06 DIAGNOSIS — I129 Hypertensive chronic kidney disease with stage 1 through stage 4 chronic kidney disease, or unspecified chronic kidney disease: Secondary | ICD-10-CM | POA: Diagnosis not present

## 2022-11-06 DIAGNOSIS — I130 Hypertensive heart and chronic kidney disease with heart failure and stage 1 through stage 4 chronic kidney disease, or unspecified chronic kidney disease: Secondary | ICD-10-CM | POA: Diagnosis present

## 2022-11-06 DIAGNOSIS — E1122 Type 2 diabetes mellitus with diabetic chronic kidney disease: Secondary | ICD-10-CM | POA: Diagnosis not present

## 2022-11-06 DIAGNOSIS — M810 Age-related osteoporosis without current pathological fracture: Secondary | ICD-10-CM | POA: Diagnosis present

## 2022-11-06 DIAGNOSIS — I5032 Chronic diastolic (congestive) heart failure: Secondary | ICD-10-CM | POA: Diagnosis present

## 2022-11-06 DIAGNOSIS — D539 Nutritional anemia, unspecified: Secondary | ICD-10-CM | POA: Diagnosis present

## 2022-11-06 DIAGNOSIS — D631 Anemia in chronic kidney disease: Secondary | ICD-10-CM | POA: Diagnosis not present

## 2022-11-06 DIAGNOSIS — D649 Anemia, unspecified: Secondary | ICD-10-CM | POA: Diagnosis present

## 2022-11-06 DIAGNOSIS — E860 Dehydration: Secondary | ICD-10-CM | POA: Diagnosis present

## 2022-11-06 DIAGNOSIS — K2981 Duodenitis with bleeding: Secondary | ICD-10-CM | POA: Diagnosis present

## 2022-11-06 DIAGNOSIS — Z87891 Personal history of nicotine dependence: Secondary | ICD-10-CM

## 2022-11-06 DIAGNOSIS — K2990 Gastroduodenitis, unspecified, without bleeding: Secondary | ICD-10-CM | POA: Diagnosis not present

## 2022-11-06 DIAGNOSIS — Z90710 Acquired absence of both cervix and uterus: Secondary | ICD-10-CM | POA: Diagnosis not present

## 2022-11-06 DIAGNOSIS — I252 Old myocardial infarction: Secondary | ICD-10-CM | POA: Diagnosis not present

## 2022-11-06 DIAGNOSIS — Z8049 Family history of malignant neoplasm of other genital organs: Secondary | ICD-10-CM | POA: Diagnosis not present

## 2022-11-06 DIAGNOSIS — I2510 Atherosclerotic heart disease of native coronary artery without angina pectoris: Secondary | ICD-10-CM

## 2022-11-06 DIAGNOSIS — E739 Lactose intolerance, unspecified: Secondary | ICD-10-CM | POA: Diagnosis present

## 2022-11-06 DIAGNOSIS — N189 Chronic kidney disease, unspecified: Secondary | ICD-10-CM | POA: Diagnosis not present

## 2022-11-06 DIAGNOSIS — E871 Hypo-osmolality and hyponatremia: Secondary | ICD-10-CM | POA: Diagnosis present

## 2022-11-06 DIAGNOSIS — K2950 Unspecified chronic gastritis without bleeding: Secondary | ICD-10-CM | POA: Diagnosis not present

## 2022-11-06 DIAGNOSIS — K2980 Duodenitis without bleeding: Secondary | ICD-10-CM | POA: Diagnosis not present

## 2022-11-06 DIAGNOSIS — D500 Iron deficiency anemia secondary to blood loss (chronic): Secondary | ICD-10-CM | POA: Diagnosis not present

## 2022-11-06 DIAGNOSIS — D6959 Other secondary thrombocytopenia: Secondary | ICD-10-CM | POA: Diagnosis present

## 2022-11-06 DIAGNOSIS — Z853 Personal history of malignant neoplasm of breast: Secondary | ICD-10-CM | POA: Diagnosis not present

## 2022-11-06 DIAGNOSIS — E7800 Pure hypercholesterolemia, unspecified: Secondary | ICD-10-CM | POA: Diagnosis present

## 2022-11-06 DIAGNOSIS — K2970 Gastritis, unspecified, without bleeding: Secondary | ICD-10-CM | POA: Diagnosis not present

## 2022-11-06 DIAGNOSIS — Z96651 Presence of right artificial knee joint: Secondary | ICD-10-CM | POA: Diagnosis present

## 2022-11-06 DIAGNOSIS — Z8249 Family history of ischemic heart disease and other diseases of the circulatory system: Secondary | ICD-10-CM | POA: Diagnosis not present

## 2022-11-06 DIAGNOSIS — Z952 Presence of prosthetic heart valve: Secondary | ICD-10-CM | POA: Diagnosis not present

## 2022-11-06 DIAGNOSIS — R195 Other fecal abnormalities: Secondary | ICD-10-CM | POA: Diagnosis not present

## 2022-11-06 DIAGNOSIS — I350 Nonrheumatic aortic (valve) stenosis: Secondary | ICD-10-CM | POA: Diagnosis present

## 2022-11-06 HISTORY — PX: BIOPSY: SHX5522

## 2022-11-06 HISTORY — PX: ESOPHAGOGASTRODUODENOSCOPY: SHX5428

## 2022-11-06 LAB — COMPREHENSIVE METABOLIC PANEL
ALT: 15 U/L (ref 0–44)
AST: 18 U/L (ref 15–41)
Albumin: 3.3 g/dL — ABNORMAL LOW (ref 3.5–5.0)
Alkaline Phosphatase: 78 U/L (ref 38–126)
Anion gap: 9 (ref 5–15)
BUN: 25 mg/dL — ABNORMAL HIGH (ref 8–23)
CO2: 22 mmol/L (ref 22–32)
Calcium: 9.1 mg/dL (ref 8.9–10.3)
Chloride: 101 mmol/L (ref 98–111)
Creatinine, Ser: 1 mg/dL (ref 0.44–1.00)
GFR, Estimated: 54 mL/min — ABNORMAL LOW (ref >60)
Glucose, Bld: 96 mg/dL (ref 70–99)
Potassium: 4.7 mmol/L (ref 3.5–5.1)
Sodium: 132 mmol/L — ABNORMAL LOW (ref 135–145)
Total Bilirubin: 0.9 mg/dL (ref 0.3–1.2)
Total Protein: 6.1 g/dL — ABNORMAL LOW (ref 6.5–8.1)

## 2022-11-06 LAB — MAGNESIUM: Magnesium: 2.1 mg/dL (ref 1.7–2.4)

## 2022-11-06 LAB — IRON AND TIBC
Iron: 33 ug/dL (ref 28–170)
Saturation Ratios: 11 % (ref 10.4–31.8)
TIBC: 307 ug/dL (ref 250–450)
UIBC: 274 ug/dL

## 2022-11-06 LAB — BLOOD GAS, VENOUS
Acid-Base Excess: 0.3 mmol/L (ref 0.0–2.0)
Bicarbonate: 26 mmol/L (ref 20.0–28.0)
O2 Saturation: 45.9 %
Patient temperature: 36.7
pCO2, Ven: 44 mmHg (ref 44–60)
pH, Ven: 7.37 (ref 7.25–7.43)
pO2, Ven: 33 mmHg (ref 32–45)

## 2022-11-06 LAB — OSMOLALITY: Osmolality: 286 mOsm/kg (ref 275–295)

## 2022-11-06 LAB — PREALBUMIN: Prealbumin: 16 mg/dL — ABNORMAL LOW (ref 18–38)

## 2022-11-06 LAB — PROTIME-INR
INR: 1.1 (ref 0.8–1.2)
Prothrombin Time: 14.5 seconds (ref 11.4–15.2)

## 2022-11-06 LAB — PREPARE RBC (CROSSMATCH)

## 2022-11-06 LAB — TROPONIN I (HIGH SENSITIVITY): Troponin I (High Sensitivity): 18 ng/L — ABNORMAL HIGH (ref <18)

## 2022-11-06 LAB — LACTIC ACID, PLASMA
Lactic Acid, Venous: 1 mmol/L (ref 0.5–1.9)
Lactic Acid, Venous: 1.1 mmol/L (ref 0.5–1.9)

## 2022-11-06 LAB — CBC
HCT: 29.5 % — ABNORMAL LOW (ref 36.0–46.0)
Hemoglobin: 9.6 g/dL — ABNORMAL LOW (ref 12.0–15.0)
MCH: 31.8 pg (ref 26.0–34.0)
MCHC: 32.5 g/dL (ref 30.0–36.0)
MCV: 97.7 fL (ref 80.0–100.0)
Platelets: 176 10*3/uL (ref 150–400)
RBC: 3.02 MIL/uL — ABNORMAL LOW (ref 3.87–5.11)
RDW: 13.2 % (ref 11.5–15.5)
WBC: 8.6 10*3/uL (ref 4.0–10.5)
nRBC: 0 % (ref 0.0–0.2)

## 2022-11-06 LAB — CK: Total CK: 65 U/L (ref 38–234)

## 2022-11-06 LAB — VITAMIN B12: Vitamin B-12: 582 pg/mL (ref 180–914)

## 2022-11-06 LAB — TSH: TSH: 1.773 u[IU]/mL (ref 0.350–4.500)

## 2022-11-06 LAB — FOLATE: Folate: >40 ng/mL (ref >5.9)

## 2022-11-06 LAB — FERRITIN: Ferritin: 54 ng/mL (ref 11–307)

## 2022-11-06 LAB — PHOSPHORUS: Phosphorus: 3.7 mg/dL (ref 2.5–4.6)

## 2022-11-06 SURGERY — EGD (ESOPHAGOGASTRODUODENOSCOPY)
Anesthesia: Monitor Anesthesia Care

## 2022-11-06 MED ORDER — PROPOFOL 500 MG/50ML IV EMUL
INTRAVENOUS | Status: DC | PRN
  Administered 2022-11-06: 100 ug/kg/min via INTRAVENOUS

## 2022-11-06 MED ORDER — ACETAMINOPHEN 650 MG RE SUPP: 650.0000 mg | Freq: Four times a day (QID) | RECTAL | Status: DC | PRN

## 2022-11-06 MED ORDER — LORATADINE 10 MG PO TABS
10.0000 mg | ORAL_TABLET | Freq: Every day | ORAL | Status: DC
  Filled 2022-11-06: qty 1

## 2022-11-06 MED ORDER — ZOLPIDEM TARTRATE 5 MG PO TABS
5.0000 mg | ORAL_TABLET | Freq: Every day | ORAL | Status: DC
  Administered 2022-11-06: 5 mg via ORAL
  Filled 2022-11-06: qty 1

## 2022-11-06 MED ORDER — SODIUM CHLORIDE 0.9 % IV SOLN: INTRAVENOUS | Status: AC

## 2022-11-06 MED ORDER — PROPOFOL 10 MG/ML IV BOLUS
INTRAVENOUS | Status: DC | PRN
  Administered 2022-11-06: 20 mg via INTRAVENOUS

## 2022-11-06 MED ORDER — ENSURE MAX PROTEIN PO LIQD
11.0000 [oz_av] | Freq: Every day | ORAL | Status: DC
  Filled 2022-11-06 (×2): qty 330

## 2022-11-06 MED ORDER — LIDOCAINE HCL (CARDIAC) PF 100 MG/5ML IV SOSY
PREFILLED_SYRINGE | INTRAVENOUS | Status: DC | PRN
  Administered 2022-11-06: 100 mg via INTRAVENOUS

## 2022-11-06 MED ORDER — LACTATED RINGERS IV SOLN: INTRAVENOUS | Status: DC

## 2022-11-06 MED ORDER — ACETAMINOPHEN 325 MG PO TABS: 650.0000 mg | ORAL_TABLET | Freq: Four times a day (QID) | ORAL | Status: DC | PRN

## 2022-11-06 MED ORDER — HYDROCODONE-ACETAMINOPHEN 5-325 MG PO TABS: 1.0000 | ORAL_TABLET | ORAL | Status: DC | PRN

## 2022-11-06 MED ORDER — SALINE SPRAY 0.65 % NA SOLN
1.0000 | NASAL | Status: DC | PRN
  Administered 2022-11-06: 1 via NASAL
  Filled 2022-11-06: qty 44

## 2022-11-06 NOTE — H&P (View-Only) (Signed)
[facility] Gastroenterology Consult  Referring Provider: No ref. provider found Primary Care Physician:  Seward Carol, MD Primary Gastroenterologist: Michail Sermon  Reason for Consultation: Symptomatic anemia, fecal occult positive stool  SUBJECTIVE:   HPI: Steven Veazie Scaduto is a 88 y.o. female with past medical history significant for C. difficile roughly 8 months prior, breast cancer in 1999, diverticulitis, coronary artery disease, chronic kidney disease, hypertension, lichen sclerosis, status post TAVR in October 2023 for severe aortic stenosis.  She notes having a chronic, roughly 20-year history of diarrhea.  Diarrhea is nonbloody in nature.  Worse with consuming cruciferous vegetables.  She had C. difficile 8 months prior and had worsened diarrhea.  She was treated with antibiotic therapy and felt well.  She started to have worsening diarrhea patient is getting.  Stool is nonbloody.  She had significant fatigue and sought medical attention.  Patient notes having colonoscopy roughly 20 years prior, unable to advanced to cecum supposedly from diverticular disease.  She underwent barium enema which was very painful for her.  No report available for my review.  She notes that within the last 10 years she had repeat attempt at colonoscopy by Dr. Michail Sermon, endoscope was not able to be advanced to cecum per patient report.  She was recommended to have repeat barium enema but declined to do so given her past experience.  She denied prior EGD.  Labs on presentation showed hemoglobin 7.3 (was 11.9 on 10/27), WBC 8.6, platelet 176, INR 1.1, iron normal, ferritin normal.  Chest x-ray showed cardiomegaly with no active disease.  Past Medical History:  Diagnosis Date   Anxiety    Breast cancer ([place])    Hx of BC, s/p mastectomy on right 1999, s/p tamoxifen X1 year   Chest pain    EC Stress test 12/21/11 Low risk, no ischemia, normal EF   Chronic kidney disease    Coronary artery disease    Remote Hx of CAD,  normal stress test 2009   Diverticulitis    GERD (gastroesophageal reflux disease)    No longer a problem   Hypercholesteremia    Hypertension    Insomnia    Lichen sclerosus    Low back pain    Myocardial infarction ([place]) 12/11/1998   Osteoarthrosis, unspecified whether generalized or localized, unspecified site    Pain    Several year Hx of pain/ SOB radiating to her neck   S/P TAVR (transcatheter aortic valve replacement) 10/10/2022   s/p TAVR with a 46m Edwards S3UR via the TF approach by Dr. MAngelena Form& Dr. ETenny Craw  Severe aortic stenosis    UTI (urinary tract infection)    Past Surgical History:  Procedure Laterality Date   ABDOMINAL HYSTERECTOMY     APPENDECTOMY     HAND SURGERY Bilateral    CTS and trigger finger on right and left   INTRAOPERATIVE TRANSTHORACIC ECHOCARDIOGRAM N/A 10/10/2022   Procedure: INTRAOPERATIVE TRANSTHORACIC ECHOCARDIOGRAM;  Surgeon: MBurnell Blanks MD;  [facility]  Service: Open Heart Surgery;  Laterality: N/A;   MASTECTOMY Right 12/11/1997   REPLACEMENT TOTAL KNEE Right 11/10/2009   RIGHT HEART CATH AND CORONARY ANGIOGRAPHY N/A 08/17/2022   Procedure: RIGHT HEART CATH AND CORONARY ANGIOGRAPHY;  Surgeon: MBurnell Blanks MD;  [facility];  Service: Cardiovascular;  Laterality: N/A;   TOTAL KNEE REVISION Right 05/17/2022   Procedure: Right knee polyethylene revision;  Surgeon: AGaynelle Arabian MD;  Location: [facility];  Service: Orthopedics;  Laterality: Right;   TRANSCATHETER AORTIC  VALVE REPLACEMENT, TRANSFEMORAL N/A 10/10/2022   Procedure: Transcatheter Aortic Valve Replacement, Transfemoral;  Surgeon: Burnell Blanks, MD;  Location: [place];  Service: Open Heart Surgery;  Laterality: N/A;   ULTRASOUND GUIDANCE FOR VASCULAR ACCESS  10/10/2022   Procedure: ULTRASOUND GUIDANCE FOR VASCULAR ACCESS;  Surgeon: Burnell Blanks, MD;  Location: [place];  Service: Open Heart Surgery;;   VESICOVAGINAL FISTULA  CLOSURE W/ TAH  12/12/1967   Prior to Admission medications   Medication Sig Start Date End Date Taking? Authorizing Provider  acetaminophen (TYLENOL) 325 MG tablet Take 650 mg by mouth every 6 (six) hours as needed for moderate pain or mild pain.   Yes [provider]  aspirin 81 MG tablet Take 1 tablet (81 mg total) by mouth daily. 06/07/22  Yes Aluisio, Pilar Plate, MD  b complex vitamins capsule Take 1 capsule by mouth in the morning.   Yes [provider]  Camphor-Menthol-Methyl Sal (SALONPAS) 3.12-16-08 % PTCH Place 1 patch onto the skin daily as needed (pain).   Yes [provider]  cetirizine (ZYRTEC) 10 MG tablet Take 10 mg by mouth daily as needed for allergies or rhinitis.   Yes [provider]  Cholecalciferol (VITAMIN D3 PO) Take 1,000 Units by mouth in the morning.   Yes [provider]  CRANBERRY PO Take 1 tablet by mouth at bedtime.   Yes [provider]  furosemide (LASIX) 40 MG tablet Take 1 tablet (40 mg total) by mouth 2 (two) times daily for 3 days, THEN 1 tablet (40 mg total) daily. Patient taking differently: Take 1 tablet (40 mg) daily   10/24/22 10/19/23 Yes Jerline Pain, MD  gabapentin (NEURONTIN) 300 MG capsule Take 600 mg by mouth at bedtime.    Yes [provider]  HYDROcodone-acetaminophen (NORCO) 5-325 MG tablet Take 1 tablet by mouth every 6 (six) hours as needed. Patient taking differently: Take 1 tablet by mouth every 6 (six) hours as needed for moderate pain or severe pain. 10/07/22  Yes Delo, Nathaneil Canary, MD  isosorbide mononitrate (IMDUR) 30 MG 24 hr tablet TAKE 1 TABLET BY MOUTH EVERY DAY 08/22/22  Yes Jerline Pain, MD  losartan (COZAAR) 50 MG tablet Take 1 tablet (50 mg total) by mouth 2 (two) times daily. Patient taking differently: Take 50 mg by mouth See admin instructions. Take 1 tablet (50 mg) by mouth scheduled every morning, may repeat dose in the afternoon if blood pressure is elevated. 02/23/22  Yes  Jerline Pain, MD  Multiple Vitamins-Minerals (PRESERVISION AREDS 2) CAPS Take 1 capsule by mouth 2 (two) times daily.   Yes [provider]  nitroGLYCERIN (NITROSTAT) 0.4 MG SL tablet PLACE 1 TABLET UNDER THE TONGUE EVERY 5 MINUTES AS NEEDED FOR CHEST PAIN. 04/11/22  Yes Jerline Pain, MD  ondansetron (ZOFRAN) 4 MG tablet Take 4 mg by mouth every 4 (four) hours as needed for vomiting or nausea.   Yes [provider]  Polyethyl Glycol-Propyl Glycol (SYSTANE OP) Place 1 drop into both eyes in the morning and at bedtime.   Yes [provider]  saccharomyces boulardii (FLORASTOR) 250 MG capsule Take 500 mg by mouth daily in the afternoon. 12/23/21  Yes [provider]  spironolactone (ALDACTONE) 25 MG tablet Take 1 tablet (25 mg total) by mouth daily. 09/07/22  Yes Jerline Pain, MD  zolpidem (AMBIEN) 5 MG tablet Take 7.5 mg by mouth at bedtime.   Yes [provider]  hydrALAZINE (APRESOLINE) 25 MG tablet  Take 1 tablet (25 mg total) by mouth daily as needed (blood pressure of 160 or above). Patient not taking: Reported on 11/05/2022 06/28/22   Jerline Pain, MD  [facility] ULTRA test strip CHECK ONCE DAILY AS DIRECTED 05/29/21   [provider]   Current Facility-Administered Medications  Medication Dose Route Frequency Provider Last Rate Last Admin   0.9 %  sodium chloride infusion   Intravenous Continuous Doutova, Anastassia, MD 75 mL/hr at 11/06/22 0303 Infusion Verify at 11/06/22 0303   acetaminophen (TYLENOL) tablet 650 mg  650 mg Oral Q6H PRN Toy Baker, MD       Or   acetaminophen (TYLENOL) suppository 650 mg  650 mg Rectal Q6H PRN Toy Baker, MD       HYDROcodone-acetaminophen (NORCO/VICODIN) 5-325 MG per tablet 1-2 tablet  1-2 tablet Oral Q4H PRN Doutova, Anastassia, MD       loratadine (CLARITIN) tablet 10 mg  10 mg Oral Daily Doutova, Anastassia, MD       pantoprazole (PROTONIX) injection 40 mg  40 mg Intravenous Q12H  Doutova, Anastassia, MD   40 mg at 11/06/22 0957   zolpidem (AMBIEN) tablet 5 mg  5 mg Oral QHS Toy Baker, MD       Allergies as of 11/05/2022 - Review Complete 11/05/2022  Allergen Reaction Noted   Amlodipine Swelling 03/10/2020   Benadryl [diphenhydramine hcl (sleep)] Other (See Comments) 01/02/2014   Broccoli flavor [flavoring agent] Diarrhea 08/10/2022   Brussels sprouts [brassica oleracea] Diarrhea 08/10/2022   Cabbage Diarrhea 08/10/2022   Ciprofloxacin Hives 04/06/2015   Collard greens [wild lettuce extract (lactuca virosa)] Diarrhea 08/10/2022   Lactose intolerance (gi) Diarrhea 01/06/2014   Macrobid [nitrofurantoin monohyd macro] Nausea And Vomiting 01/02/2014   Naproxen Other (See Comments) 01/02/2014   Quinine sulfate [quinine] Hives 01/02/2014   Rosuvastatin  04/14/2020   Sulfa antibiotics Hives 01/02/2014   Tramadol Other (See Comments) 04/06/2015   Penicillins Rash 01/02/2014   Prednisone Other (See Comments) 01/02/2014   Family History  Problem Relation Age of Onset   Cancer Mother        cervical   Heart disease Father    Social History   Socioeconomic History   Marital status: Widowed    Spouse name: Not on file   Number of children: 3   Years of education: Not on file   Highest education level: Not on file  Occupational History   Occupation: Retired Pharmacist, hospital  Tobacco Use   Smoking status: Former    Packs/day: 1.00    Types: Cigarettes    Quit date: 07/08/1997    Years since quitting: 25.3   Smokeless tobacco: Never  Vaping Use   Vaping Use: Never used  Substance and Sexual Activity   Alcohol use: No   Drug use: No   Sexual activity: Not on file  Other Topics Concern   Not on file  Social History Narrative   Not on file   Social Determinants of Health   Financial Resource Strain: Not on file  Food Insecurity: No Food Insecurity (11/06/2022)   Hunger Vital Sign    Worried About Running Out of Food in the Last Year: Never true     Ran Out of Food in the Last Year: Never true  Transportation Needs: No Transportation Needs (11/06/2022)   PRAPARE - Hydrologist (Medical): No    Lack of Transportation (Non-Medical): No  Physical Activity: Not on file  Stress: Not on file  Social  Connections: Not on file  Intimate Partner Violence: Not At Risk (11/06/2022)   Humiliation, Afraid, Rape, and Kick questionnaire    Fear of Current or Ex-Partner: No    Emotionally Abused: No    Physically Abused: No    Sexually Abused: No   Review of Systems:  Review of Systems  Respiratory:  Negative for shortness of breath.   Cardiovascular:  Negative for chest pain.  Gastrointestinal:  Positive for diarrhea and nausea. Negative for abdominal pain, blood in stool and vomiting.    OBJECTIVE:   Temp:  [97.7 F (36.5 C)-98.4 F (36.9 C)] 98.2 F (36.8 C) (11/27 1017) Pulse Rate:  [62-82] 74 (11/27 1017) Resp:  [14-22] 16 (11/27 1017) BP: (103-143)/(44-80) 143/68 (11/27 1017) SpO2:  [93 %-99 %] 97 % (11/27 1017) Weight:  [59.3 kg] 59.3 kg (11/27 0023) Last BM Date : 11/05/22 Physical Exam Constitutional:      General: She is not in acute distress.    Appearance: She is not ill-appearing, toxic-appearing or diaphoretic.  Cardiovascular:     Rate and Rhythm: Normal rate and regular rhythm.  Pulmonary:     Effort: No respiratory distress.     Breath sounds: Normal breath sounds.  Abdominal:     General: Bowel sounds are normal. There is no distension.     Palpations: Abdomen is soft.     Tenderness: There is no abdominal tenderness. There is no guarding.  Neurological:     Mental Status: She is alert.     Labs: Recent Labs    11/05/22 1953 11/06/22 0109  WBC 8.2 8.6  HGB 7.3* 9.6*  HCT 24.5* 29.5*  PLT 127* 176   BMET Recent Labs    11/05/22 1953 11/06/22 0109  NA 132* 132*  K 4.6 4.7  CL 104 101  CO2 17* 22  GLUCOSE 95 96  BUN 22 25*  CREATININE 0.89 1.00  CALCIUM 8.0* 9.1    LFT Recent Labs    11/06/22 0109  PROT 6.1*  ALBUMIN 3.3*  AST 18  ALT 15  ALKPHOS 78  BILITOT 0.9   PT/INR Recent Labs    11/06/22 0109  LABPROT 14.5  INR 1.1    Diagnostic imaging: DG Chest Port 1 View  Result Date: 11/05/2022 CLINICAL DATA:  Shortness of breath. Nausea, vomiting, and diarrhea for 3 days. EXAM: PORTABLE CHEST 1 VIEW COMPARISON:  10/06/2022. FINDINGS: The heart is enlarged and a TAVR stent is in place. Atherosclerotic calcification of the aorta is noted. No consolidation, effusion, or pneumothorax. There is limited visualization of the left costophrenic angle. No acute osseous abnormality. Multiple surgical clips are noted over the right chest. IMPRESSION: Cardiomegaly with no active disease. Electronically Signed   By: Brett Fairy M.D.   On: 11/05/2022 23:57    IMPRESSION: Macrocytic anemia  -B12 and folate within normal limits Fecal occult positive stool Thrombocytopenia Elevated total bilirubin Aortic stenosis with recent TAVR History C. difficile  PLAN: -Recommend EGD to further evaluate macrocytic anemia and fecal occult positive stool -Do not suspect that repeat colonoscopy would be successful, patient not agreeable to barium enema, if EGD unremarkable would consider CT colonography in the outpatient setting -Okay for aspirin peri-procedurally -Continue IV PPI therapy -Follow-up stool studies -Further recommendations to follow EGD today -N.p.o. for procedure   LOS: 0 days   Danton Clap, DO [facility] Gastroenterology

## 2022-11-06 NOTE — Progress Notes (Signed)
Consultation Progress Note   Patient: Barbara Russell [id number]:[id number] DOB: February 08, 1930 DOA: 11/05/2022 DOS: the patient was seen and examined on 11/06/2022 Primary service: Kendryck Lacroix, Manfred Shirts, MD  Brief hospital course: 88 y.o. female with medical history significant of C.Diff, CAD, sp TAVR, history of breast cancer status postmastectomy, CKD, diverticulitis, GERD, HLD, HTN, osteoporosis, chronic diastolic CHF, history of macular degeneration chronic hyponatremia She Presented with lightheadedness Patient comes in from [facility] independent living complaining of nausea vomiting diarrhea for the past 3 days has had history of C. difficile several months ago on arrival by EMS blood pressure 124/54 heart rate 84 Noted to be orthostatic in the emergency department feeling very weak with stands up feels dehydrated. Diarrhea has been getting worse decreased p.o. intake cramping abdominal pain patient is unsure what her stools looking like is secondary to visual impairment.  No recent antibiotic use  Assessment and Plan: Symptomatic anemia Transfused 1 unit and follow CBC. Hb stable at 9.6  [facility] plans for EGD, keep NPO Protonix twice daily 40 mg   Diarrhea No further episodes since admission On IVF Has a hx of C.diff    Coronary artery disease due to lipid rich plaque Chronic stable hold aspirin given Hemoccult positive stools   Severe aortic stenosis Status post TAVR   Hypertension Allow permissive hypertension for tonight   Chronic kidney disease, stage 3a ([place])  -chronic avoid nephrotoxic medications such as NSAIDs, Vanco Zosyn combo,  avoid hypotension, continue to follow renal function     Breast cancer ([place]) Remote in remission   Dehydration Rehydrate and follow fluid status check orthostatics in the morning   Hypomagnesemia Will replace magnesium level check phosphate and other electrolytes replace as needed   Hyponatremia Chronic recurrent Obtain urine  electrolytes and check TSH   Other plan as per orders.       TRH will continue to follow the patient.  Subjective: No complaints this morning, denies any further episodes of diarrhea.   Physical Exam:   General:  in No  Acute distress Psychological: Alert and   Oriented Head/ENT:    pale Dry Mucous Membranes SKIN decreased Skin turgor,  Skin clean Dry and intact no rash Heart: Regular rate and rhythm systolic  Murmur, no Rub or gallop  Lungs: , no wheezes or crackles   Abdomen: Soft,  non-tender, Non distended   bowel sounds present  Lower extremities: no clubbing, cyanosis, no  edema Neurologically Grossly intact, moving all 4 extremities equally   MSK: Normal range of motion   Vitals:   11/06/22 0120 11/06/22 0155 11/06/22 0416 11/06/22 1017  BP: 113/80 (!) 112/44 (!) 117/46 (!) 143/68  Pulse: 71 73 62 74  Resp:  '18 18 16  '$ Temp: 98.1 F (36.7 C) 98 F (36.7 C) 98.4 F (36.9 C) 98.2 F (36.8 C)  TempSrc: Oral Oral Oral Oral  SpO2: 95% 94% 97% 97%  Weight:      Height:       Data Reviewed:  There are no new results to review at this time.  Family Communication:   Time spent: 15 minutes.  Author: Cristela Felt, MD 11/06/2022 11:43 AM  For on call review [email or web address].

## 2022-11-06 NOTE — Consult Note (Signed)
[facility] Gastroenterology Consult  Referring Provider: No ref. provider found Primary Care Physician:  Seward Carol, MD Primary Gastroenterologist: Michail Sermon  Reason for Consultation: Symptomatic anemia, fecal occult positive stool  SUBJECTIVE:   HPI: Barbara Russell is a 88 y.o. female with past medical history significant for C. difficile roughly 8 months prior, breast cancer in 1999, diverticulitis, coronary artery disease, chronic kidney disease, hypertension, lichen sclerosis, status post TAVR in October 2023 for severe aortic stenosis.  She notes having a chronic, roughly 20-year history of diarrhea.  Diarrhea is nonbloody in nature.  Worse with consuming cruciferous vegetables.  She had C. difficile 8 months prior and had worsened diarrhea.  She was treated with antibiotic therapy and felt well.  She started to have worsening diarrhea patient is getting.  Stool is nonbloody.  She had significant fatigue and sought medical attention.  Patient notes having colonoscopy roughly 20 years prior, unable to advanced to cecum supposedly from diverticular disease.  She underwent barium enema which was very painful for her.  No report available for my review.  She notes that within the last 10 years she had repeat attempt at colonoscopy by Dr. Michail Sermon, endoscope was not able to be advanced to cecum per patient report.  She was recommended to have repeat barium enema but declined to do so given her past experience.  She denied prior EGD.  Labs on presentation showed hemoglobin 7.3 (was 11.9 on 10/27), WBC 8.6, platelet 176, INR 1.1, iron normal, ferritin normal.  Chest x-ray showed cardiomegaly with no active disease.  Past Medical History:  Diagnosis Date   Anxiety    Breast cancer ([place])    Hx of BC, s/p mastectomy on right 1999, s/p tamoxifen X1 year   Chest pain    EC Stress test 12/21/11 Low risk, no ischemia, normal EF   Chronic kidney disease    Coronary artery disease    Remote Hx of CAD,  normal stress test 2009   Diverticulitis    GERD (gastroesophageal reflux disease)    No longer a problem   Hypercholesteremia    Hypertension    Insomnia    Lichen sclerosus    Low back pain    Myocardial infarction ([place]) 12/11/1998   Osteoarthrosis, unspecified whether generalized or localized, unspecified site    Pain    Several year Hx of pain/ SOB radiating to her neck   S/P TAVR (transcatheter aortic valve replacement) 10/10/2022   s/p TAVR with a 82m Edwards S3UR via the TF approach by Dr. MAngelena Form& Dr. ETenny Craw  Severe aortic stenosis    UTI (urinary tract infection)    Past Surgical History:  Procedure Laterality Date   ABDOMINAL HYSTERECTOMY     APPENDECTOMY     HAND SURGERY Bilateral    CTS and trigger finger on right and left   INTRAOPERATIVE TRANSTHORACIC ECHOCARDIOGRAM N/A 10/10/2022   Procedure: INTRAOPERATIVE TRANSTHORACIC ECHOCARDIOGRAM;  Surgeon: MBurnell Blanks MD;  [facility] [facility]  Service: Open Heart Surgery;  Laterality: N/A;   MASTECTOMY Right 12/11/1997   REPLACEMENT TOTAL KNEE Right 11/10/2009   RIGHT HEART CATH AND CORONARY ANGIOGRAPHY N/A 08/17/2022   Procedure: RIGHT HEART CATH AND CORONARY ANGIOGRAPHY;  Surgeon: MBurnell Blanks MD;  [facility];  Service: Cardiovascular;  Laterality: N/A;   TOTAL KNEE REVISION Right 05/17/2022   Procedure: Right knee polyethylene revision;  Surgeon: AGaynelle Arabian MD;  Location: [facility];  Service: Orthopedics;  Laterality: Right;   TRANSCATHETER AORTIC  VALVE REPLACEMENT, TRANSFEMORAL N/A 10/10/2022   Procedure: Transcatheter Aortic Valve Replacement, Transfemoral;  Surgeon: Burnell Blanks, MD;  Location: [place];  Service: Open Heart Surgery;  Laterality: N/A;   ULTRASOUND GUIDANCE FOR VASCULAR ACCESS  10/10/2022   Procedure: ULTRASOUND GUIDANCE FOR VASCULAR ACCESS;  Surgeon: Burnell Blanks, MD;  Location: [place];  Service: Open Heart Surgery;;   VESICOVAGINAL FISTULA  CLOSURE W/ TAH  12/12/1967   Prior to Admission medications   Medication Sig Start Date End Date Taking? Authorizing Provider  acetaminophen (TYLENOL) 325 MG tablet Take 650 mg by mouth every 6 (six) hours as needed for moderate pain or mild pain.   Yes [provider]  aspirin 81 MG tablet Take 1 tablet (81 mg total) by mouth daily. 06/07/22  Yes Aluisio, Pilar Plate, MD  b complex vitamins capsule Take 1 capsule by mouth in the morning.   Yes [provider]  Camphor-Menthol-Methyl Sal (SALONPAS) 3.12-16-08 % PTCH Place 1 patch onto the skin daily as needed (pain).   Yes [provider]  cetirizine (ZYRTEC) 10 MG tablet Take 10 mg by mouth daily as needed for allergies or rhinitis.   Yes [provider]  Cholecalciferol (VITAMIN D3 PO) Take 1,000 Units by mouth in the morning.   Yes [provider]  CRANBERRY PO Take 1 tablet by mouth at bedtime.   Yes [provider]  furosemide (LASIX) 40 MG tablet Take 1 tablet (40 mg total) by mouth 2 (two) times daily for 3 days, THEN 1 tablet (40 mg total) daily. Patient taking differently: Take 1 tablet (40 mg) daily   10/24/22 10/19/23 Yes Jerline Pain, MD  gabapentin (NEURONTIN) 300 MG capsule Take 600 mg by mouth at bedtime.    Yes [provider]  HYDROcodone-acetaminophen (NORCO) 5-325 MG tablet Take 1 tablet by mouth every 6 (six) hours as needed. Patient taking differently: Take 1 tablet by mouth every 6 (six) hours as needed for moderate pain or severe pain. 10/07/22  Yes Delo, Nathaneil Canary, MD  isosorbide mononitrate (IMDUR) 30 MG 24 hr tablet TAKE 1 TABLET BY MOUTH EVERY DAY 08/22/22  Yes Jerline Pain, MD  losartan (COZAAR) 50 MG tablet Take 1 tablet (50 mg total) by mouth 2 (two) times daily. Patient taking differently: Take 50 mg by mouth See admin instructions. Take 1 tablet (50 mg) by mouth scheduled every morning, may repeat dose in the afternoon if blood pressure is elevated. 02/23/22  Yes  Jerline Pain, MD  Multiple Vitamins-Minerals (PRESERVISION AREDS 2) CAPS Take 1 capsule by mouth 2 (two) times daily.   Yes [provider]  nitroGLYCERIN (NITROSTAT) 0.4 MG SL tablet PLACE 1 TABLET UNDER THE TONGUE EVERY 5 MINUTES AS NEEDED FOR CHEST PAIN. 04/11/22  Yes Jerline Pain, MD  ondansetron (ZOFRAN) 4 MG tablet Take 4 mg by mouth every 4 (four) hours as needed for vomiting or nausea.   Yes [provider]  Polyethyl Glycol-Propyl Glycol (SYSTANE OP) Place 1 drop into both eyes in the morning and at bedtime.   Yes [provider]  saccharomyces boulardii (FLORASTOR) 250 MG capsule Take 500 mg by mouth daily in the afternoon. 12/23/21  Yes [provider]  spironolactone (ALDACTONE) 25 MG tablet Take 1 tablet (25 mg total) by mouth daily. 09/07/22  Yes Jerline Pain, MD  zolpidem (AMBIEN) 5 MG tablet Take 7.5 mg by mouth at bedtime.   Yes [provider]  hydrALAZINE (APRESOLINE) 25 MG tablet  Take 1 tablet (25 mg total) by mouth daily as needed (blood pressure of 160 or above). Patient not taking: Reported on 11/05/2022 06/28/22   Jerline Pain, MD  [facility] ULTRA test strip CHECK ONCE DAILY AS DIRECTED 05/29/21   [provider]   Current Facility-Administered Medications  Medication Dose Route Frequency Provider Last Rate Last Admin   0.9 %  sodium chloride infusion   Intravenous Continuous Doutova, Anastassia, MD 75 mL/hr at 11/06/22 0303 Infusion Verify at 11/06/22 0303   acetaminophen (TYLENOL) tablet 650 mg  650 mg Oral Q6H PRN Toy Baker, MD       Or   acetaminophen (TYLENOL) suppository 650 mg  650 mg Rectal Q6H PRN Toy Baker, MD       HYDROcodone-acetaminophen (NORCO/VICODIN) 5-325 MG per tablet 1-2 tablet  1-2 tablet Oral Q4H PRN Doutova, Anastassia, MD       loratadine (CLARITIN) tablet 10 mg  10 mg Oral Daily Doutova, Anastassia, MD       pantoprazole (PROTONIX) injection 40 mg  40 mg Intravenous Q12H  Doutova, Anastassia, MD   40 mg at 11/06/22 0957   zolpidem (AMBIEN) tablet 5 mg  5 mg Oral QHS Toy Baker, MD       Allergies as of 11/05/2022 - Review Complete 11/05/2022  Allergen Reaction Noted   Amlodipine Swelling 03/10/2020   Benadryl [diphenhydramine hcl (sleep)] Other (See Comments) 01/02/2014   Broccoli flavor [flavoring agent] Diarrhea 08/10/2022   Brussels sprouts [brassica oleracea] Diarrhea 08/10/2022   Cabbage Diarrhea 08/10/2022   Ciprofloxacin Hives 04/06/2015   Collard greens [wild lettuce extract (lactuca virosa)] Diarrhea 08/10/2022   Lactose intolerance (gi) Diarrhea 01/06/2014   Macrobid [nitrofurantoin monohyd macro] Nausea And Vomiting 01/02/2014   Naproxen Other (See Comments) 01/02/2014   Quinine sulfate [quinine] Hives 01/02/2014   Rosuvastatin  04/14/2020   Sulfa antibiotics Hives 01/02/2014   Tramadol Other (See Comments) 04/06/2015   Penicillins Rash 01/02/2014   Prednisone Other (See Comments) 01/02/2014   Family History  Problem Relation Age of Onset   Cancer Mother        cervical   Heart disease Father    Social History   Socioeconomic History   Marital status: Widowed    Spouse name: Not on file   Number of children: 3   Years of education: Not on file   Highest education level: Not on file  Occupational History   Occupation: Retired Pharmacist, hospital  Tobacco Use   Smoking status: Former    Packs/day: 1.00    Types: Cigarettes    Quit date: 07/08/1997    Years since quitting: 25.3   Smokeless tobacco: Never  Vaping Use   Vaping Use: Never used  Substance and Sexual Activity   Alcohol use: No   Drug use: No   Sexual activity: Not on file  Other Topics Concern   Not on file  Social History Narrative   Not on file   Social Determinants of Health   Financial Resource Strain: Not on file  Food Insecurity: No Food Insecurity (11/06/2022)   Hunger Vital Sign    Worried About Running Out of Food in the Last Year: Never true     Ran Out of Food in the Last Year: Never true  Transportation Needs: No Transportation Needs (11/06/2022)   PRAPARE - Hydrologist (Medical): No    Lack of Transportation (Non-Medical): No  Physical Activity: Not on file  Stress: Not on file  Social  Connections: Not on file  Intimate Partner Violence: Not At Risk (11/06/2022)   Humiliation, Afraid, Rape, and Kick questionnaire    Fear of Current or Ex-Partner: No    Emotionally Abused: No    Physically Abused: No    Sexually Abused: No   Review of Systems:  Review of Systems  Respiratory:  Negative for shortness of breath.   Cardiovascular:  Negative for chest pain.  Gastrointestinal:  Positive for diarrhea and nausea. Negative for abdominal pain, blood in stool and vomiting.    OBJECTIVE:   Temp:  [97.7 F (36.5 C)-98.4 F (36.9 C)] 98.2 F (36.8 C) (11/27 1017) Pulse Rate:  [62-82] 74 (11/27 1017) Resp:  [14-22] 16 (11/27 1017) BP: (103-143)/(44-80) 143/68 (11/27 1017) SpO2:  [93 %-99 %] 97 % (11/27 1017) Weight:  [59.3 kg] 59.3 kg (11/27 0023) Last BM Date : 11/05/22 Physical Exam Constitutional:      General: She is not in acute distress.    Appearance: She is not ill-appearing, toxic-appearing or diaphoretic.  Cardiovascular:     Rate and Rhythm: Normal rate and regular rhythm.  Pulmonary:     Effort: No respiratory distress.     Breath sounds: Normal breath sounds.  Abdominal:     General: Bowel sounds are normal. There is no distension.     Palpations: Abdomen is soft.     Tenderness: There is no abdominal tenderness. There is no guarding.  Neurological:     Mental Status: She is alert.     Labs: Recent Labs    11/05/22 1953 11/06/22 0109  WBC 8.2 8.6  HGB 7.3* 9.6*  HCT 24.5* 29.5*  PLT 127* 176   BMET Recent Labs    11/05/22 1953 11/06/22 0109  NA 132* 132*  K 4.6 4.7  CL 104 101  CO2 17* 22  GLUCOSE 95 96  BUN 22 25*  CREATININE 0.89 1.00  CALCIUM 8.0* 9.1    LFT Recent Labs    11/06/22 0109  PROT 6.1*  ALBUMIN 3.3*  AST 18  ALT 15  ALKPHOS 78  BILITOT 0.9   PT/INR Recent Labs    11/06/22 0109  LABPROT 14.5  INR 1.1    Diagnostic imaging: DG Chest Port 1 View  Result Date: 11/05/2022 CLINICAL DATA:  Shortness of breath. Nausea, vomiting, and diarrhea for 3 days. EXAM: PORTABLE CHEST 1 VIEW COMPARISON:  10/06/2022. FINDINGS: The heart is enlarged and a TAVR stent is in place. Atherosclerotic calcification of the aorta is noted. No consolidation, effusion, or pneumothorax. There is limited visualization of the left costophrenic angle. No acute osseous abnormality. Multiple surgical clips are noted over the right chest. IMPRESSION: Cardiomegaly with no active disease. Electronically Signed   By: Brett Fairy M.D.   On: 11/05/2022 23:57    IMPRESSION: Macrocytic anemia  -B12 and folate within normal limits Fecal occult positive stool Thrombocytopenia Elevated total bilirubin Aortic stenosis with recent TAVR History C. difficile  PLAN: -Recommend EGD to further evaluate macrocytic anemia and fecal occult positive stool -Do not suspect that repeat colonoscopy would be successful, patient not agreeable to barium enema, if EGD unremarkable would consider CT colonography in the outpatient setting -Okay for aspirin peri-procedurally -Continue IV PPI therapy -Follow-up stool studies -Further recommendations to follow EGD today -N.p.o. for procedure   LOS: 0 days   Danton Clap, DO [facility] Gastroenterology

## 2022-11-06 NOTE — Anesthesia Preprocedure Evaluation (Addendum)
Anesthesia Evaluation  Patient identified by MRN, date of birth, ID band Patient awake    Reviewed: Allergy & Precautions, NPO status , Patient's Chart, lab work & pertinent test results  History of Anesthesia Complications Negative for: history of anesthetic complications  Airway Mallampati: III  TM Distance: >3 FB Neck ROM: Full    Dental  (+) Dental Advisory Given, Teeth Intact   Pulmonary former smoker   Pulmonary exam normal        Cardiovascular hypertension, Pt. on medications + CAD and + Past MI  Normal cardiovascular exam+ Valvular Problems/Murmurs (s/p TAVR)    '23 TTE - The aortic valve is has been replaced by a 23 mm Sapien Valve. Echo  findings are consistent with normal structure and function of the aortic valve prosthesis. EF 70 to 75%. There is moderate concentric left ventricular hypertrophy. Grade I diastolic dysfunction (impaired relaxation). Trivial mitral valve regurgitation, mild mitral stenosis.     Neuro/Psych  PSYCHIATRIC DISORDERS Anxiety     negative neurological ROS     GI/Hepatic Neg liver ROS,GERD  Controlled,,  Endo/Other  diabetes, Well Controlled, Type 2   Na 132   Renal/GU CRFRenal disease     Musculoskeletal  (+) Arthritis ,    Abdominal   Peds  Hematology  (+) Blood dyscrasia, anemia   Anesthesia Other Findings   Reproductive/Obstetrics  Breast cancer                              Anesthesia Physical Anesthesia Plan  ASA: 3  Anesthesia Plan: MAC   Post-op Pain Management:    Induction:   PONV Risk Score and Plan: 2 and Treatment may vary due to age or medical condition and Propofol infusion  Airway Management Planned: Natural Airway and Nasal Cannula  Additional Equipment: None  Intra-op Plan:   Post-operative Plan:   Informed Consent: I have reviewed the patients History and Physical, chart, labs and discussed the procedure  including the risks, benefits and alternatives for the proposed anesthesia with the patient or authorized representative who has indicated his/her understanding and acceptance.       Plan Discussed with: CRNA and Anesthesiologist  Anesthesia Plan Comments:        Anesthesia Quick Evaluation

## 2022-11-06 NOTE — Progress Notes (Addendum)
Initial Nutrition Assessment  DOCUMENTATION CODES:   Not applicable  INTERVENTION:  -continue regular diet as tolerated -trial Ensure Max q day (150kcal, 30g protein/bottle) low osmolality for diarrhea -Consider banatrol BID and Florastor to treat diarrhea.  NUTRITION DIAGNOSIS:  Predicted suboptimal nutrient intake related to diarrhea as evidenced by percent weight loss, per patient/family report.  GOAL:  Patient will meet greater than or equal to 90% of their needs  MONITOR:  PO intake, Supplement acceptance  REASON FOR ASSESSMENT:  Consult Assessment of nutrition requirement/status  ASSESSMENT:  Pt is a 88yo F with PMH of C.Diff, CAD, s/p TAVR, breast cancer s/p mastectomy, CKD, diverticulitis, GERD, HLD, HTN, osteoporosis, chronic diastolic CHF, macular degeneration, and chronic hyponatremia who presents with n/v/d x 3 days.  Visited pt at bedside this afternoon following EGD. EGD reveals gastritis and duodenitis. She reports continued diarrhea prior to eating, but says she was so hungry she ate 100% of meal. Observed 100% meal consumption. Pt notes she cannot eat raw fruits and vegetables. Has been choosing appropriate meals, no need for special therapeutic diet. Denies recent weight loss. Chart review shows an unintended but not significant 1.7% weight loss in 1 month. Offered ONS, pt was hesitant due to lactose intolerance. Discussed that Ensure supplements are suitable for lactose intolerance. Recommend Ensure max q day to provide 150kcal and 30g protein/bottle. Ensure max has low osmolality which will not worsen diarrhea. If diarrhea does not improve with current treatment, consider Banatrol BID and florastor to restore healthy gut.  Medications reviewed and include: protonix, NS at 24m/hr  Labs reviewed: Na:132, BUN:25, GFR:54   NUTRITION - FOCUSED PHYSICAL EXAM: No significant muscle and fat loss  Diet Order:   Diet Order             Diet regular Room service  appropriate? Yes; Fluid consistency: Thin  Diet effective now                   EDUCATION NEEDS:   Education needs have been addressed  Skin:  Skin Assessment: Reviewed RN Assessment  Last BM:  11/26  Height:   Ht Readings from Last 1 Encounters:  11/06/22 '5\' 3"'$  (1.6 m)    Weight:   Wt Readings from Last 1 Encounters:  11/06/22 59.3 kg    Ideal Body Weight:     BMI:  Body mass index is 23.16 kg/m.  Estimated Nutritional Needs:   Kcal:  1500-1800kcal  Protein:  70-90g  Fluid:  1500-18052m  KaCandise BowensMS, RD, LDN, CNSC See AMiON for contact information

## 2022-11-06 NOTE — Anesthesia Postprocedure Evaluation (Signed)
Anesthesia Post Note  Patient: Barbara Russell  Procedure(s) Performed: ESOPHAGOGASTRODUODENOSCOPY (EGD) BIOPSY     Patient location during evaluation: PACU Anesthesia Type: MAC Level of consciousness: awake and alert Pain management: pain level controlled Vital Signs Assessment: post-procedure vital signs reviewed and stable Respiratory status: spontaneous breathing, nonlabored ventilation and respiratory function stable Cardiovascular status: stable and blood pressure returned to baseline Anesthetic complications: no   No notable events documented.  Last Vitals:  Vitals:   11/06/22 1350 11/06/22 1400  BP: (!) 126/51 (!) 143/44  Pulse: 69 68  Resp: 16 16  Temp:    SpO2: 95% 95%    Last Pain:  Vitals:   11/06/22 1400  TempSrc:   PainSc: 0-No pain                 Audry Pili

## 2022-11-06 NOTE — Evaluation (Signed)
Physical Therapy Evaluation Patient Details Name: Barbara Russell MRN: [id number] DOB: July 30, 1930 Today's Date: 11/06/2022  History of Present Illness  Barbara Russell is a 88 y.o. female with medical history significant of C.Diff, CAD, sp TAVR, history of breast cancer status postmastectomy, CKD, diverticulitis, GERD, HLD, HTN, osteoporosis, chronic diastolic CHF, C diff,  history of macular degeneration, chronic hyponatremia, TKA.        Presented 11/05/22 with lightheadedness ,nausea, vomiting, diarrhea   Noted to be orthostatic in the emergency department  Clinical Impression  Pt admitted with above diagnosis.  Pt currently with functional limitations due to the deficits listed below (see PT Problem List). Pt will benefit from skilled PT to increase their independence and safety with mobility to allow discharge to the venue listed below.       The patient is eager to get up and move around. Ambulate x 30' using Rw. Patient is independent  using a rollator at baseline, Independent in ADL's. Patient reports  that she is scheduled for RTHA in the future.    Patient should progress to return home at her ILF.    Recommendations for follow up therapy are one component of a multi-disciplinary discharge planning process, led by the attending physician.  Recommendations may be updated based on patient status, additional functional criteria and insurance authorization.  Follow Up Recommendations Home health PT      Assistance Recommended at Discharge PRN  Patient can return home with the following  A little help with bathing/dressing/bathroom;A little help with walking and/or transfers;Assistance with cooking/housework;Assist for transportation;Help with stairs or ramp for entrance    Equipment Recommendations None recommended by PT  Recommendations for Other Services       Functional Status Assessment Patient has had a recent decline in their functional status and demonstrates the ability to  make significant improvements in function in a reasonable and predictable amount of time.     Precautions / Restrictions Precautions Precautions: Fall Precaution Comments: monitor for dizziness      Mobility  Bed Mobility Overal bed mobility: Needs Assistance Bed Mobility: Supine to Sit     Supine to sit: Min guard          Transfers Overall transfer level: Needs assistance Equipment used: Rolling walker (2 wheels) Transfers: Sit to/from Stand Sit to Stand: Min guard           General transfer comment: use of Rw  around room    Ambulation/Gait Ambulation/Gait assistance: Min guard Gait Distance (Feet): 20 Feet Assistive device: Rolling walker (2 wheels) Gait Pattern/deviations: Step-through pattern Gait velocity: decr     General Gait Details: patient manages RW well,  Not ready to ambulate in hall today.  Stairs            Wheelchair Mobility    Modified Rankin (Stroke Patients Only)       Balance Overall balance assessment: Mild deficits observed, not formally tested                                           Pertinent Vitals/Pain Pain Assessment Pain Assessment: Faces Faces Pain Scale: Hurts even more Pain Location: right hip, scheduled for THA in the near future Pain Descriptors / Indicators: Discomfort Pain Intervention(s): Monitored during session    Home Living Family/patient expects to be discharged to:: Private residence Living Arrangements: Alone   Type of  Home: Independent living facility Home Access: Level entry       Home Layout: One level Home Equipment: Rollator (4 wheels) Additional Comments: has an aide  for 1 hour each at lunch and evening, daily    Prior Function Prior Level of Function : Independent/Modified Independent             Mobility Comments: using rollator at all times ADLs Comments: IADL's     Hand Dominance   Dominant Hand: Right    Extremity/Trunk Assessment   Upper  Extremity Assessment Upper Extremity Assessment: Overall WFL for tasks assessed    Lower Extremity Assessment Lower Extremity Assessment: RLE deficits/detail RLE Deficits / Details: antalgic WB    Cervical / Trunk Assessment Cervical / Trunk Assessment: Kyphotic  Communication   Communication: HOH  Cognition Arousal/Alertness: Awake/alert Behavior During Therapy: WFL for tasks assessed/performed Overall Cognitive Status: Within Functional Limits for tasks assessed                                          General Comments      Exercises     Assessment/Plan    PT Assessment Patient needs continued PT services  PT Problem List Decreased strength;Decreased mobility;Decreased knowledge of precautions;Decreased activity tolerance;Decreased knowledge of use of DME       PT Treatment Interventions DME instruction;Therapeutic activities;Gait training;Therapeutic exercise;Patient/family education;Functional mobility training    PT Goals (Current goals can be found in the Care Plan section)  Acute Rehab PT Goals Patient Stated Goal: go home, have hip surgery PT Goal Formulation: With patient/family Time For Goal Achievement: 11/20/22 Potential to Achieve Goals: Good    Frequency Min 3X/week     Co-evaluation               AM-PAC PT "6 Clicks" Mobility  Outcome Measure Help needed turning from your back to your side while in a flat bed without using bedrails?: None Help needed moving from lying on your back to sitting on the side of a flat bed without using bedrails?: A Little Help needed moving to and from a bed to a chair (including a wheelchair)?: A Little Help needed standing up from a chair using your arms (e.g., wheelchair or bedside chair)?: A Little Help needed to walk in hospital room?: A Little Help needed climbing 3-5 steps with a railing? : A Lot 6 Click Score: 18    End of Session   Activity Tolerance: Patient tolerated treatment  well Patient left: in chair;with call bell/phone within reach;with family/visitor present Nurse Communication: Mobility status PT Visit Diagnosis: Unsteadiness on feet (R26.81);Difficulty in walking, not elsewhere classified (R26.2)    Time: 1100-1116 PT Time Calculation (min) (ACUTE ONLY): 16 min   Charges:   PT Evaluation $PT Eval Low Complexity: 1 Low          [place] Office [phone] Weekend [phone]   Claretha Cooper 11/06/2022, 2:17 PM

## 2022-11-06 NOTE — Op Note (Signed)
[facility] Patient Name: Barbara Russell Procedure Date: 11/06/2022 MRN: [id number] Attending MD: Danton Clap DO, DO, [id number] Date of Birth: August 14, 1930 CSN: [id number] Age: 88 Admit Type: Inpatient Procedure:                Upper GI endoscopy Indications:              Iron deficiency anemia secondary to chronic blood                            loss, Heme positive stool Providers:                Danton Clap DO, DO, Mikey College, RN, Benetta Spar, Technician Referring MD:              Medicines:                See the Anesthesia note for documentation of the                            administered medications Complications:            No immediate complications. Estimated blood loss:                            Minimal. Estimated Blood Loss:     Estimated blood loss was minimal. Procedure:                Pre-Anesthesia Assessment:                           - ASA Grade Assessment: III - A patient with severe                            systemic disease.                           After obtaining informed consent, the endoscope was                            passed under direct vision. Throughout the                            procedure, the patient's blood pressure, pulse, and                            oxygen saturations were monitored continuously. The                            [id number]-[id number] ([id number]) Olympus endoscope was introduced                            through the mouth, and advanced to the second part                            of duodenum. The upper GI endoscopy was  accomplished without difficulty. The patient                            tolerated the procedure well. Scope In: Scope Out: Findings:      The examined esophagus was normal. No biopsies or other specimens were       collected for this exam. Estimated blood loss: none.      Scattered mild inflammation characterized by congestion  (edema),       erosions, erythema and granularity was found in the gastric antrum.       Biopsies were taken with a cold forceps for Helicobacter pylori testing.      Localized mild inflammation characterized by erythema and granularity       was found in the duodenal bulb. No biopsies or other specimens were       collected for this exam. Estimated blood loss: none. Impression:               - Normal esophagus. No specimens collected.                           - Gastritis. Biopsied.                           - Duodenitis. No specimens collected. Moderate Sedation:      See the other procedure note for documentation of moderate sedation with       intraservice time. Recommendation:           - Return patient to hospital ward for ongoing care.                           - Resume regular diet indefinitely.                           - Observe patient's clinical course.                           - Consider outpatient CT colonography to further                            evaluate anemia and FOBT+. Procedure Code(s):        --- Professional ---                           [phone], Esophagogastroduodenoscopy, flexible,                            transoral; with biopsy, single or multiple Diagnosis Code(s):        --- Professional ---                           K29.70, Gastritis, unspecified, without bleeding                           K29.80, Duodenitis without bleeding                           D50.0, Iron deficiency anemia secondary to blood  loss (chronic) CPT copyright 2022 [employer]. All rights reserved. The codes documented in this report are preliminary and upon coder review may  be revised to meet current compliance requirements. Dr Danton Clap, [place], DO 11/06/2022 1:47:50 PM Number of Addenda: 0

## 2022-11-06 NOTE — Transfer of Care (Signed)
Immediate Anesthesia Transfer of Care Note  Patient: Barbara Russell  Procedure(s) Performed: ESOPHAGOGASTRODUODENOSCOPY (EGD) BIOPSY  Patient Location: Endoscopy Unit  Anesthesia Type:MAC  Level of Consciousness: awake and drowsy  Airway & Oxygen Therapy: Patient Spontanous Breathing  Post-op Assessment: Report given to RN and Post -op Vital signs reviewed and stable  Post vital signs: Reviewed and stable  Last Vitals:  Vitals Value Taken Time  BP    Temp    Pulse    Resp 12 11/06/22 1338  SpO2    Vitals shown include unvalidated device data.  Last Pain:  Vitals:   11/06/22 1249  TempSrc: Temporal  PainSc: 0-No pain         Complications: No notable events documented.

## 2022-11-06 NOTE — TOC Progression Note (Signed)
Transition of Care [facility]) - Progression Note    Patient Details  Name: Barbara Russell MRN: [id number] Date of Birth: [birth year]  Transition of Care [facility]) CM/SW [place], RN Phone [phone]  11/06/2022, 8:37 AM  Clinical Narrative:    TOC acknowledges consult for "Admitted from any facility ." TOC will follow.        Expected Discharge Plan and Services                                                 Social Determinants of Health (SDOH) Interventions    Readmission Risk Interventions     No data to display

## 2022-11-06 NOTE — Progress Notes (Signed)
OT Cancellation Note  Patient Details Name: Barbara Russell MRN: [id number] DOB: 10/06/1930   Cancelled Treatment:    Reason Eval/Treat Not Completed: Patient at procedure or test/ unavailable.   [place], OTR/L [facility] Re[facility] [phone] Office [phone]    Almon Register 11/06/2022, 1:13 PM

## 2022-11-06 NOTE — Interval H&P Note (Signed)
History and Physical Interval Note:  11/06/2022 1:17 PM  Barbara Russell  has presented today for surgery, with the diagnosis of Anemia, FOBT+.  The various methods of treatment have been discussed with the patient and family. After consideration of risks, benefits and other options for treatment, the patient has consented to  Procedure(s): ESOPHAGOGASTRODUODENOSCOPY (EGD) (N/A) as a surgical intervention.  The patient's history has been reviewed, patient examined, no change in status, stable for surgery.  I have reviewed the patient's chart and labs.  Questions were answered to the patient's satisfaction.     Loney Laurence

## 2022-11-07 LAB — CBC
HCT: 36.7 % (ref 36.0–46.0)
Hemoglobin: 11.8 g/dL — ABNORMAL LOW (ref 12.0–15.0)
MCH: 31.1 pg (ref 26.0–34.0)
MCHC: 32.2 g/dL (ref 30.0–36.0)
MCV: 96.8 fL (ref 80.0–100.0)
Platelets: 165 10*3/uL (ref 150–400)
RBC: 3.79 MIL/uL — ABNORMAL LOW (ref 3.87–5.11)
RDW: 13.7 % (ref 11.5–15.5)
WBC: 7.8 10*3/uL (ref 4.0–10.5)
nRBC: 0 % (ref 0.0–0.2)

## 2022-11-07 LAB — TYPE AND SCREEN
ABO/RH(D): O POS
Antibody Screen: NEGATIVE
Unit division: 0

## 2022-11-07 LAB — BPAM RBC
Blood Product Expiration Date: 202312252359
ISSUE DATE / TIME: 202311270134
Unit Type and Rh: 5100

## 2022-11-07 LAB — BASIC METABOLIC PANEL
Anion gap: 9 (ref 5–15)
BUN: 13 mg/dL (ref 8–23)
CO2: 21 mmol/L — ABNORMAL LOW (ref 22–32)
Calcium: 9.4 mg/dL (ref 8.9–10.3)
Chloride: 107 mmol/L (ref 98–111)
Creatinine, Ser: 0.76 mg/dL (ref 0.44–1.00)
GFR, Estimated: >60 mL/min (ref >60)
Glucose, Bld: 102 mg/dL — ABNORMAL HIGH (ref 70–99)
Potassium: 4.3 mmol/L (ref 3.5–5.1)
Sodium: 137 mmol/L (ref 135–145)

## 2022-11-07 LAB — SURGICAL PATHOLOGY

## 2022-11-07 MED ORDER — PANTOPRAZOLE SODIUM 40 MG PO TBEC: 40.0000 mg | DELAYED_RELEASE_TABLET | Freq: Two times a day (BID) | ORAL | 0 refills | Status: DC

## 2022-11-07 MED ORDER — PANTOPRAZOLE SODIUM 40 MG PO TBEC
40.0000 mg | DELAYED_RELEASE_TABLET | Freq: Two times a day (BID) | ORAL | Status: DC
  Administered 2022-11-07: 40 mg via ORAL
  Filled 2022-11-07: qty 1

## 2022-11-07 NOTE — TOC Progression Note (Signed)
Transition of Care [facility]) - Progression Note    Patient Details  Name: Barbara Russell MRN: [id number] Date of Birth: 1930-03-06  Transition of Care [facility]) CM/SW [place], RN Phone Number:[phone]  11/07/2022, 11:21 AM  Clinical Narrative:    TOC consulted for Home health recommendations. CM at bedside to offer choice. Patient states that she is from [place] [facility] but does not want to use their therapy. Patient would like home health PT with [facility]. Referral has been accepted by [facility] with Alvis Lemmings. AVS has been updated. Patient states that she will notify Demetrius Charity that she does not want to use in house therapy. No other needs noted at this time. TOC will sign off.         Expected Discharge Plan and Services           Expected Discharge Date: 11/07/22                                     Social Determinants of Health (SDOH) Interventions    Readmission Risk Interventions     No data to display

## 2022-11-07 NOTE — Progress Notes (Signed)
Jonita F Bronk to be D/C'd per MD order. Discussed with the patient and all questions fully answered. ? VSS, Skin clean, dry and intact without evidence of skin break down, no evidence of skin tears noted. ? IV catheter discontinued intact. Site without signs and symptoms of complications. Dressing and pressure applied. ? An After Visit Summary was printed and given to the patient. Patient informed where to pickup prescriptions. ? D/c education completed with patient/family including follow up instructions, medication list, d/c activities limitations if indicated, with other d/c instructions as indicated by MD - patient able to verbalize understanding, all questions fully answered.  ? Patient instructed to return to ED, call 911, or call MD for any changes in condition.   Patient satisfied that all belongings were returned including cell phone, table, and clothing. ? Patient to be escorted via [place], and D/C home via private auto.

## 2022-11-07 NOTE — Discharge Summary (Addendum)
Physician Discharge Summary   Patient: Barbara Russell MRN: [id number] DOB: [birth year]  Admit date:     11/05/2022  Discharge date: 11/07/22  Discharge Physician: Manfred Shirts Yocelyn Brocious   PCP: Seward Carol, MD   Recommendations at discharge:    Follow up with GI for outpatient work up  Discharge Diagnoses: Principal Problem:   Symptomatic anemia Active Problems:   Severe aortic stenosis   Coronary artery disease due to lipid rich plaque   Hypertension   Breast cancer (HCC)   Chronic kidney disease, stage 3a (HCC)   Diarrhea   Dehydration   Hypomagnesemia   Hyponatremia  Resolved Problems:   * No resolved hospital problems. *  Hospital Course: 88 y.o. female with medical history significant of C.Diff, CAD, sp TAVR, history of breast cancer status postmastectomy, CKD, diverticulitis, GERD, HLD, HTN, osteoporosis, chronic diastolic CHF, history of macular degeneration chronic hyponatremia She Presented with lightheadedness Patient comes in from [facility] independent living complaining of nausea vomiting diarrhea for the past 3 days has had history of C. difficile several months ago on arrival by EMS blood pressure 124/54 heart rate 84 Noted to be orthostatic in the emergency department feeling very weak with stands up feels dehydrated. Diarrhea has been getting worse decreased p.o. intake cramping abdominal pain patient is unsure what her stools looking like is secondary to visual impairment.  No recent antibiotic use.  labs are concerning for anemia of hemoglobin 7.3 and she was found to have positive fecal occult blood test.  Started on Protonix twice daily.   Patient was transfused 1 unit of PRBCs.  Hemoglobin improved to 11 at time of discharge   GI was consulted and she had an EGD done on 11/27 which showed gastritis.  biopsies were obtained.  Patient will  follow-up with GI as outpatient for further evaluation of LGI given that she had unsuccessful colonoscopies in  the past due to diverticular disease  Assessment and Plan: Symptomatic anemia- IMPROVED Status post 1 unit of PRBCs hemoglobin at 11  Continue Protonix twice daily 40 mg  GI Bleed -Positive FOBT -Secondary to Gastritis and Duodenitis   Diarrhea No further episodes since admission Has a hx of C.diff     Coronary artery disease due to lipid rich plaque Chronic stable hold aspirin given Hemoccult positive stools   Severe aortic stenosis Status post TAVR   Hypertension -Resume BP meds   Chronic kidney disease, stage 3a (HCC)  -chronic avoid nephrotoxic medications such as NSAIDs, Vanco Zosyn combo,  avoid hypotension, continue to follow renal function     Breast cancer ([place]) Remote in remission   Dehydration Rehydrate and follow fluid status check orthostatics in the morning   Hypomagnesemia Will replace magnesium level check phosphate and other electrolytes replace as needed   Hyponatremia Chronic recurrent Obtain urine electrolytes and check TSH          Consultants: gi Procedures performed: EGD  Disposition: Home Diet recommendation:  Cardiac diet DISCHARGE MEDICATION: Allergies as of 11/07/2022       Reactions   Amlodipine Swelling   LEE with '5mg'$  dose Foot swelling   Benadryl [diphenhydramine Hcl (sleep)] Other (See Comments)   Hyperstimulation   Broccoli Flavor [flavoring Agent] Diarrhea   Brussels Sprouts [brassica Oleracea] Diarrhea   Cabbage Diarrhea   Ciprofloxacin Hives   Collard Greens [wild Lettuce Extract (lactuca Virosa)] Diarrhea   Lactose Intolerance (gi) Diarrhea   Macrobid [nitrofurantoin Monohyd Macro] Nausea And Vomiting   Naproxen Other (See Comments)  Stomach pain   Quinine Sulfate [quinine] Hives   Rosuvastatin    headache   Sulfa Antibiotics Hives      Tramadol Other (See Comments)   unknown to patient   Penicillins Rash   Has patient had a PCN reaction causing immediate rash, facial/tongue/throat swelling, SOB or  lightheadedness with hypotension: Yes Has patient had a PCN reaction causing severe rash involving mucus membranes or skin necrosis: No Has patient had a PCN reaction that required hospitalization: No Has patient had a PCN reaction occurring within the last 10 years: No If all of the above answers are "NO", then may proceed with Cephalosporin use. Tolerated Cephalosporin Date: 05/19/22.   Prednisone Other (See Comments)   Weakness and pain in joints        Medication List     STOP taking these medications    aspirin 81 MG tablet       TAKE these medications    acetaminophen 325 MG tablet Commonly known as: TYLENOL Take 650 mg by mouth every 6 (six) hours as needed for moderate pain or mild pain.   b complex vitamins capsule Take 1 capsule by mouth in the morning.   cetirizine 10 MG tablet Commonly known as: ZYRTEC Take 10 mg by mouth daily as needed for allergies or rhinitis.   CRANBERRY PO Take 1 tablet by mouth at bedtime.   furosemide 40 MG tablet Commonly known as: LASIX Take 1 tablet (40 mg total) by mouth 2 (two) times daily for 3 days, THEN 1 tablet (40 mg total) daily. Start taking on: October 24, 2022 What changed: See the new instructions.   gabapentin 300 MG capsule Commonly known as: NEURONTIN Take 600 mg by mouth at bedtime.   hydrALAZINE 25 MG tablet Commonly known as: APRESOLINE Take 1 tablet (25 mg total) by mouth daily as needed (blood pressure of 160 or above).   HYDROcodone-acetaminophen 5-325 MG tablet Commonly known as: Norco Take 1 tablet by mouth every 6 (six) hours as needed. What changed: reasons to take this   isosorbide mononitrate 30 MG 24 hr tablet Commonly known as: IMDUR TAKE 1 TABLET BY MOUTH EVERY DAY   losartan 50 MG tablet Commonly known as: COZAAR Take 1 tablet (50 mg total) by mouth 2 (two) times daily. What changed:  when to take this additional instructions   nitroGLYCERIN 0.4 MG SL tablet Commonly known as:  NITROSTAT PLACE 1 TABLET UNDER THE TONGUE EVERY 5 MINUTES AS NEEDED FOR CHEST PAIN.   ondansetron 4 MG tablet Commonly known as: ZOFRAN Take 4 mg by mouth every 4 (four) hours as needed for vomiting or nausea.   OneTouch Ultra test strip Generic drug: glucose blood CHECK ONCE DAILY AS DIRECTED   pantoprazole 40 MG tablet Commonly known as: PROTONIX Take 1 tablet (40 mg total) by mouth 2 (two) times daily.   PreserVision AREDS 2 Caps Take 1 capsule by mouth 2 (two) times daily.   saccharomyces boulardii 250 MG capsule Commonly known as: FLORASTOR Take 500 mg by mouth daily in the afternoon.   Salonpas 3.12-16-08 % Ptch Generic drug: Camphor-Menthol-Methyl Sal Place 1 patch onto the skin daily as needed (pain).   spironolactone 25 MG tablet Commonly known as: ALDACTONE Take 1 tablet (25 mg total) by mouth daily.   SYSTANE OP Place 1 drop into both eyes in the morning and at bedtime.   VITAMIN D3 PO Take 1,000 Units by mouth in the morning.   zolpidem 5 MG tablet Commonly  known as: AMBIEN Take 7.5 mg by mouth at bedtime.        Discharge Exam: Danley Danker Weights   11/06/22 0023 11/06/22 1249  Weight: 59.3 kg 59.3 kg   General:  in No  Acute distress Psychological: Alert and   Oriented SKIN decreased Skin turgor,  Skin clean Dry and intact no rash Heart: Regular rate and rhythm systolic  Murmur, no Rub or gallop  Lungs: , no wheezes or crackles   Abdomen: Soft,  non-tender, Non distended   bowel sounds present  Lower extremities: no clubbing, cyanosis, no  edema Neurologically Grossly intact, moving all 4 extremities equally   MSK: Normal range of motion    Condition at discharge: good  The results of significant diagnostics from this hospitalization (including imaging, microbiology, ancillary and laboratory) are listed below for reference.   Imaging Studies: DG Chest Port 1 View  Result Date: 11/05/2022 CLINICAL DATA:  Shortness of breath. Nausea, vomiting,  and diarrhea for 3 days. EXAM: PORTABLE CHEST 1 VIEW COMPARISON:  10/06/2022. FINDINGS: The heart is enlarged and a TAVR stent is in place. Atherosclerotic calcification of the aorta is noted. No consolidation, effusion, or pneumothorax. There is limited visualization of the left costophrenic angle. No acute osseous abnormality. Multiple surgical clips are noted over the right chest. IMPRESSION: Cardiomegaly with no active disease. Electronically Signed   By: Brett Fairy M.D.   On: 11/05/2022 23:57   CT HEAD WO CONTRAST (5MM)  Result Date: 10/27/2022 CLINICAL DATA:  Acute vision changes EXAM: CT HEAD WITHOUT CONTRAST TECHNIQUE: Contiguous axial images were obtained from the base of the skull through the vertex without intravenous contrast. RADIATION DOSE REDUCTION: This exam was performed according to the departmental dose-optimization program which includes automated exposure control, adjustment of the mA and/or kV according to patient size and/or use of iterative reconstruction technique. COMPARISON:  06/06/2010 FINDINGS: Brain: No acute intracranial findings are seen. There are no signs of bleeding within the cranium. Cortical sulci are prominent. There is decreased density in periventricular white matter. Vascular: Unremarkable. Skull: Unremarkable. Sinuses/Orbits: There is mild mucosal thickening in ethmoid sinus. Other: None. IMPRESSION: No acute intracranial findings are seen.  Atrophy. Electronically Signed   By: Elmer Picker M.D.   On: 10/27/2022 15:30   ECHOCARDIOGRAM COMPLETE  Result Date: 10/11/2022    ECHOCARDIOGRAM REPORT   Patient Name:   SAMI ROES Date of Exam: 10/11/2022 Medical Rec #:  [id number]        Height:       63.0 in Accession #:    [id number]       Weight:       133.0 lb Date of Birth:  March 31, 1930        BSA:          1.626 m Patient Age:    57 years         BP:           132/51 mmHg Patient Gender: F                HR:           85 bpm. Exam Location:  Inpatient  Procedure: 2D Echo, Cardiac Doppler, Color Doppler and 3D Echo Indications:    Post TAVR evaluation V43.3 / Z95.2  History:        Patient has prior history of Echocardiogram examinations. CAD                 and Previous Myocardial  Infarction, Aortic Valve Disease; Risk                 Factors:Hypertension, Dyslipidemia and Diabetes. Chronic kidney                 disease. GERD.                 Aortic Valve: 23 mm Edwards valve is present in the aortic                 position.  Sonographer:    Darlina Sicilian RDCS Referring Phys: [id number] [place]  1. The aortic valve is has been replaced by a 23 mm Sapien Valve. Aortic valve regurgitation is not visualized. No aortic stenosis is present. There is a 23 mm Edwards valve present in the aortic position. Echo findings are consistent with normal structure and function of the aortic valve prosthesis. Mean gradient of 8 mm Hg, Peak gradient 13 mm Hg, DVI 0.67 and EOA 4.5 cm2.  2. Left ventricular ejection fraction, by estimation, is 70 to 75%. The left ventricle has hyperdynamic function. Left ventricular endocardial border not optimally defined to evaluate regional wall motion. There is moderate concentric left ventricular hypertrophy. Left ventricular diastolic parameters are consistent with Grade I diastolic dysfunction (impaired relaxation).  3. Right ventricular systolic function is normal. The right ventricular size is not well visualized.  4. MVA 4 cm2 by 2D Planimetry. The mitral valve is degenerative. Trivial mitral valve regurgitation. The mean mitral valve gradient is 5.0 mmHg. Severe mitral annular calcification.  5. Aortic Normal Ascending Aorta. Comparison(s): Prior images reviewed side by side. Trivial PVL no longer visualized. FINDINGS  Left Ventricle: Left ventricular ejection fraction, by estimation, is 70 to 75%. The left ventricle has hyperdynamic function. Left ventricular endocardial border not optimally defined to evaluate  regional wall motion. The left ventricular internal cavity size was small. There is moderate concentric left ventricular hypertrophy. Left ventricular diastolic parameters are consistent with Grade I diastolic dysfunction (impaired relaxation). Right Ventricle: The right ventricular size is not well visualized. No increase in right ventricular wall thickness. Right ventricular systolic function is normal. Left Atrium: Left atrial size was normal in size. Right Atrium: Right atrial size was normal in size. Pericardium: Trivial pericardial effusion is present. The pericardial effusion is anterior to the right ventricle. Mitral Valve: MVA 4 cm2 by 2D Planimetry. The mitral valve is degenerative in appearance. There is mild thickening of the mitral valve leaflet(s). There is mild calcification of the mitral valve leaflet(s). Severe mitral annular calcification. Trivial mitral valve regurgitation. MV peak gradient, 13.9 mmHg. The mean mitral valve gradient is 5.0 mmHg with average heart rate of 83 bpm. Tricuspid Valve: The tricuspid valve is normal in structure. Tricuspid valve regurgitation is trivial. Aortic Valve: The aortic valve is grossly normal. Aortic valve regurgitation is not visualized. No aortic stenosis is present. Aortic valve mean gradient measures 8.0 mmHg. Aortic valve peak gradient measures 10.5 mmHg. Aortic valve area, by VTI measures  4.54 cm. There is a 23 mm Edwards valve present in the aortic position. Echo findings are consistent with normal structure and function of the aortic valve prosthesis. Pulmonic Valve: The pulmonic valve was not well visualized. Pulmonic valve regurgitation is not visualized. No evidence of pulmonic stenosis. Aorta: The aortic root is normal in size and structure and the ascending aorta was not well visualized. IAS/Shunts: No atrial level shunt detected by color flow Doppler.  LEFT VENTRICLE PLAX 2D LVIDd:  3.50 cm   Diastology LVIDs:         2.50 cm   LV e'  medial:    5.43 cm/s LV PW:         1.40 cm   LV E/e' medial:  20.1 LV IVS:        1.40 cm   LV e' lateral:   5.43 cm/s LVOT diam:     2.20 cm   LV E/e' lateral: 20.1 LV SV:         142 LV SV Index:   87 LVOT Area:     3.80 cm                           3D Volume EF:                          3D EF:        65 %                          LV EDV:       110 ml                          LV ESV:       39 ml                          LV SV:        72 ml RIGHT VENTRICLE RV S prime:     13.60 cm/s TAPSE (M-mode): 1.9 cm LEFT ATRIUM             Index        RIGHT ATRIUM          Index LA diam:        4.20 cm 2.58 cm/m   RA Area:     6.91 cm LA Vol (A2C):   44.5 ml 27.37 ml/m  RA Volume:   8.39 ml  5.16 ml/m LA Vol (A4C):   29.5 ml 18.14 ml/m LA Biplane Vol: 37.6 ml 23.13 ml/m  AORTIC VALVE AV Area (Vmax):    3.35 cm AV Area (Vmean):   3.20 cm AV Area (VTI):     4.54 cm AV Vmax:           161.80 cm/s AV Vmean:          107.300 cm/s AV VTI:            0.312 m AV Peak Grad:      10.5 mmHg AV Mean Grad:      8.0 mmHg LVOT Vmax:         142.50 cm/s LVOT Vmean:        90.250 cm/s LVOT VTI:          0.373 m LVOT/AV VTI ratio: 1.19  AORTA Ao Asc diam: 3.00 cm MITRAL VALVE MV Area (PHT): 2.97 cm     SHUNTS MV Peak grad:  13.9 mmHg    Systemic VTI:  0.37 m MV Mean grad:  5.0 mmHg     Systemic Diam: 2.20 cm MV Vmax:       1.86 m/s MV Vmean:      98.8 cm/s MV Decel Time: 255 msec MV E velocity: 109.00 cm/s MV A velocity: 177.00 cm/s MV E/A ratio:  0.62 Mahesh Chandrasekhar  MD Electronically signed by Rudean Haskell MD Signature Date/Time: 10/11/2022/1:54:41 PM    Final    ECHOCARDIOGRAM LIMITED  Result Date: 10/10/2022    ECHOCARDIOGRAM LIMITED REPORT   Patient Name:   DANNON PERLOW Date of Exam: 10/10/2022 Medical Rec #:  [id number]        Height:       63.0 in Accession #:    [id number]       Weight:       132.3 lb Date of Birth:  [birth year]        BSA:          1.622 m Patient Age:    80 years         BP:            153/87 mmHg Patient Gender: F                HR:           68 bpm. Exam Location:  Inpatient Procedure: Limited Echo, Color Doppler and Cardiac Doppler Indications:     I35.0 Nonrheumatic aortic (valve) stenosis  History:         Patient has prior history of Echocardiogram examinations, most                  recent 07/25/2022. CAD, Aortic Valve Disease; Risk                  Factors:Hypertension and Diabetes. Breast cancer. Aortic                  stenosis.                  Aortic Valve: 23 mm Sapien prosthetic, stented (TAVR) valve is                  present in the aortic position. Procedure Date: 10/10/2022.  Sonographer:     Roseanna Rainbow RDCS Referring Phys:  [place] Diagnosing Phys: Rudean Haskell MD IMPRESSIONS  1. Interventional TTE for TAVR Placement.  2. Prior to procedure, calcified tri-leaflet aortic valve. No AI. Low flow low gradient aortic stenosis with mean gradient 21 mm Hg, Peak gradient 34 mm Hg, DVI 0.42.  3. Post procedure, a 23 mm Sapien valve is present. Slightly low deployment without LVOT obstruction. Trivial PVL adjacent to the intraventricular septum. Mean gradient 4 mm Hg, peak gradient 9 mm Hg. EOA 3.44 cm2. Normal DVI.  4. Left ventricular ejection fraction, by estimation, is 60 to 65%. The left ventricle has normal function.  5. The mitral valve is abnormal. Mild mitral valve regurgitation. Moderate mitral annular calcification.  6. Aortic valve regurgitation is not visualized. There is a 23 mm Sapien prosthetic (TAVR) valve present in the aortic position. Procedure Date: 10/10/2022. Comparison(s): Succesful TAVR Placement. FINDINGS  Left Ventricle: Left ventricular ejection fraction, by estimation, is 60 to 65%. The left ventricle has normal function. Pericardium: There is no evidence of pericardial effusion. Mitral Valve: The mitral valve is abnormal. Moderate mitral annular calcification. Mild mitral valve regurgitation. Tricuspid Valve: Tricuspid valve  regurgitation is trivial. Aortic Valve: Aortic valve regurgitation is not visualized. Aortic valve mean gradient measures 4.0 mmHg. Aortic valve peak gradient measures 9.1 mmHg. Aortic valve area, by VTI measures 3.54 cm. There is a 23 mm Sapien prosthetic, stented (TAVR) valve present in the aortic position. Procedure Date: 10/10/2022. Aorta: The aortic root and ascending aorta are structurally normal, with no evidence of dilitation.  LEFT VENTRICLE PLAX 2D LVIDd:         4.20 cm LV PW:         1.40 cm LV IVS:        1.30 cm LVOT diam:     2.35 cm LV SV:         124 LV SV Index:   77 LVOT Area:     4.34 cm  LV Volumes (MOD) LV vol d, MOD A2C: 57.8 ml LV vol d, MOD A4C: 42.6 ml LV vol s, MOD A2C: 14.8 ml LV vol s, MOD A4C: 16.7 ml LV SV MOD A2C:     43.0 ml LV SV MOD A4C:     42.6 ml LV SV MOD BP:      33.6 ml AORTIC VALVE AV Area (Vmax):    3.07 cm AV Area (Vmean):   3.12 cm AV Area (VTI):     3.54 cm AV Vmax:           151.00 cm/s AV Vmean:          94.300 cm/s AV VTI:            0.352 m AV Peak Grad:      9.1 mmHg AV Mean Grad:      4.0 mmHg LVOT Vmax:         107.00 cm/s LVOT Vmean:        67.800 cm/s LVOT VTI:          0.287 m LVOT/AV VTI ratio: 0.82  AORTA Ao Asc diam: 3.20 cm  SHUNTS Systemic VTI:  0.29 m Systemic Diam: 2.35 cm Rudean Haskell MD Electronically signed by Rudean Haskell MD Signature Date/Time: 10/10/2022/11:45:30 AM    Final    Structural Heart Procedure  Result Date: 10/10/2022 See surgical note for result.   Microbiology: Results for orders placed or performed during the hospital encounter of 10/06/22  SARS CORONAVIRUS 2 (TAT 6-24 HRS) Anterior Nasal Swab     Status: None   Collection Time: 10/06/22  9:41 AM   Specimen: Anterior Nasal Swab  Result Value Ref Range Status   SARS Coronavirus 2 NEGATIVE NEGATIVE Final    Comment: (NOTE) SARS-CoV-2 target nucleic acids are NOT DETECTED.  The SARS-CoV-2 RNA is generally detectable in upper and lower respiratory  specimens during the acute phase of infection. Negative results do not preclude SARS-CoV-2 infection, do not rule out co-infections with other pathogens, and should not be used as the sole basis for treatment or other patient management decisions. Negative results must be combined with clinical observations, patient history, and epidemiological information. The expected result is Negative.  Fact Sheet for Patients: SugarRoll.be  Fact Sheet for Healthcare Providers: https://www.woods-mathews.com/  This test is not yet approved or cleared by the [place] FDA and  has been authorized for detection and/or diagnosis of SARS-CoV-2 by FDA under an Emergency Use Authorization (EUA). This EUA will remain  in effect (meaning this test can be used) for the duration of the COVID-19 declaration under Se ction 564(b)(1) of the Act, 21 U.S.C. section 360bbb-3(b)(1), unless the authorization is terminated or revoked sooner.  Performed at [facility], [place] [address] [place], [place] [address]   Surgical pcr screen     Status: None   Collection Time: 10/06/22  9:41 AM   Specimen: Nasal Mucosa; Nasal Swab  Result Value Ref Range Status   MRSA, PCR NEGATIVE NEGATIVE Final   Staphylococcus aureus NEGATIVE NEGATIVE Final  Comment: (NOTE) The Xpert SA Assay (FDA approved for NASAL specimens in patients 20 years of age and older), is one component of a comprehensive surveillance program. It is not intended to diagnose infection nor to guide or monitor treatment. Performed at [facility], [place] [address], [place], [place] [address]     Labs: CBC: Recent Labs  Lab 11/05/22 1953 11/06/22 0109 11/07/22 0904  WBC 8.2 8.6 7.8  NEUTROABS 6.8  --   --   HGB 7.3* 9.6* 11.8*  HCT 24.5* 29.5* 36.7  MCV 103.8* 97.7 96.8  PLT 127* 176 269   Basic Metabolic Panel: Recent Labs  Lab 11/05/22 1953 11/06/22 0109 11/07/22 0904  NA 132*  132* 137  K 4.6 4.7 4.3  CL 104 101 107  CO2 17* 22 21*  GLUCOSE 95 96 102*  BUN 22 25* 13  CREATININE 0.89 1.00 0.76  CALCIUM 8.0* 9.1 9.4  MG 1.4* 2.1  --   PHOS  --  3.7  --    Liver Function Tests: Recent Labs  Lab 11/05/22 1953 11/06/22 0109  AST 26 18  ALT 13 15  ALKPHOS 71 78  BILITOT 1.3* 0.9  PROT 5.7* 6.1*  ALBUMIN 3.1* 3.3*   CBG: No results for input(s): "GLUCAP" in the last 168 hours.  Discharge time spent: greater than 30 minutes.  Signed: Cristela Felt, MD [facility] 11/07/2022

## 2022-11-07 NOTE — Progress Notes (Signed)
Mobility Specialist - Progress Note   11/07/22 0943  Mobility  Activity Ambulated with assistance in hallway  Level of Assistance Standby assist, set-up cues, supervision of patient - no hands on  Assistive Device Front wheel walker  Distance Ambulated (ft) 250 ft  Activity Response Tolerated well  Mobility Referral Yes  $Mobility charge 1 Mobility   Pt received in bed and agreeable to mobility. Assisted pt to bathroom for BM, which ended up just being flatulence. Pt is MinA from sit-to-stand & standby during ambulation. No complaints during mobility. Pt to recliner after session with all needs met.      [facility]

## 2022-11-07 NOTE — Progress Notes (Signed)
[facility] Progress Note  SUBJECTIVE:   Interval history: Barbara Russell was seen and evaluated today at bedside.  Resting comfortably in bed.  Denied any abdominal pain, noted that she has gas.  Had liquid bowel movement yesterday which was nonbloody, brown/yellow in color.  Past Medical History:  Diagnosis Date   Anxiety    Breast cancer ([place])    Hx of BC, s/p mastectomy on right 1999, s/p tamoxifen X1 year   Chest pain    EC Stress test 12/21/11 Low risk, no ischemia, normal EF   Chronic kidney disease    Coronary artery disease    Remote Hx of CAD, normal stress test 2009   Diverticulitis    GERD (gastroesophageal reflux disease)    No longer a problem   Hypercholesteremia    Hypertension    Insomnia    Lichen sclerosus    Low back pain    Myocardial infarction ([place]) 12/11/1998   Osteoarthrosis, unspecified whether generalized or localized, unspecified site    Pain    Several year Hx of pain/ SOB radiating to her neck   S/P TAVR (transcatheter aortic valve replacement) 10/10/2022   s/p TAVR with a 19m Edwards S3UR via the TF approach by Dr. MAngelena Form& Dr. ETenny Craw  Severe aortic stenosis    UTI (urinary tract infection)    Past Surgical History:  Procedure Laterality Date   ABDOMINAL HYSTERECTOMY     APPENDECTOMY     HAND SURGERY Bilateral    CTS and trigger finger on right and left   INTRAOPERATIVE TRANSTHORACIC ECHOCARDIOGRAM N/A 10/10/2022   Procedure: INTRAOPERATIVE TRANSTHORACIC ECHOCARDIOGRAM;  Surgeon: MBurnell Blanks MD;  [facility]  Service: Open Heart Surgery;  Laterality: N/A;   MASTECTOMY Right 12/11/1997   REPLACEMENT TOTAL KNEE Right 11/10/2009   RIGHT HEART CATH AND CORONARY ANGIOGRAPHY N/A 08/17/2022   Procedure: RIGHT HEART CATH AND CORONARY ANGIOGRAPHY;  Surgeon: MBurnell Blanks MD;  [facility];  Service: Cardiovascular;  Laterality: N/A;   TOTAL KNEE REVISION Right 05/17/2022   Procedure: Right  knee polyethylene revision;  Surgeon: AGaynelle Arabian MD;  Location: [facility];  Service: Orthopedics;  Laterality: Right;   TRANSCATHETER AORTIC VALVE REPLACEMENT, TRANSFEMORAL N/A 10/10/2022   Procedure: Transcatheter Aortic Valve Replacement, Transfemoral;  Surgeon: MBurnell Blanks MD;  [facility]  Service: Open Heart Surgery;  Laterality: N/A;   ULTRASOUND GUIDANCE FOR VASCULAR ACCESS  10/10/2022   Procedure: ULTRASOUND GUIDANCE FOR VASCULAR ACCESS;  Surgeon: MBurnell Blanks MD;  [facility]  Service: Open Heart Surgery;;   VESICOVAGINAL FISTULA CLOSURE W/ TAH  12/12/1967   Current Facility-Administered Medications  Medication Dose Route Frequency Provider Last Rate Last Admin   acetaminophen (TYLENOL) tablet 650 mg  650 mg Oral Q6H PRN VLoney Laurence DO       Or   acetaminophen (TYLENOL) suppository 650 mg  650 mg Rectal Q6H PRN VLoney Laurence DO       HYDROcodone-acetaminophen (NORCO/VICODIN) 5-325 MG per tablet 1-2 tablet  1-2 tablet Oral Q4H PRN VLoney Laurence DO       loratadine (CLARITIN) tablet 10 mg  10 mg Oral Daily VDanton ClapH, DO       pantoprazole (PROTONIX) EC tablet 40 mg  40 mg Oral BID LAdrian Saran RPH   40 mg at 11/07/22 00160  protein supplement (ENSURE MAX) liquid  11 oz Oral Daily Dibia, PManfred Shirts MD  sodium chloride (OCEAN) 0.65 % nasal spray 1 spray  1 spray Each Nare PRN Dibia, Manfred Shirts, MD   1 spray at 11/06/22 2138   zolpidem (AMBIEN) tablet 5 mg  5 mg Oral QHS Danton Clap H, DO   5 mg at 11/06/22 2133   Allergies as of 11/05/2022 - Review Complete 11/05/2022  Allergen Reaction Noted   Amlodipine Swelling 03/10/2020   Benadryl [diphenhydramine hcl (sleep)] Other (See Comments) 01/02/2014   Broccoli flavor [flavoring agent] Diarrhea 08/10/2022   Brussels sprouts [brassica oleracea] Diarrhea 08/10/2022   Cabbage Diarrhea 08/10/2022   Ciprofloxacin Hives 04/06/2015   Collard greens [wild lettuce  extract (lactuca virosa)] Diarrhea 08/10/2022   Lactose intolerance (gi) Diarrhea 01/06/2014   Macrobid [nitrofurantoin monohyd macro] Nausea And Vomiting 01/02/2014   Naproxen Other (See Comments) 01/02/2014   Quinine sulfate [quinine] Hives 01/02/2014   Rosuvastatin  04/14/2020   Sulfa antibiotics Hives 01/02/2014   Tramadol Other (See Comments) 04/06/2015   Penicillins Rash 01/02/2014   Prednisone Other (See Comments) 01/02/2014   Review of Systems:  Review of Systems  Gastrointestinal:  Positive for diarrhea. Negative for abdominal pain and blood in stool.    OBJECTIVE:   Temp:  [97.9 F (36.6 C)-98.5 F (36.9 C)] 98 F (36.7 C) (11/28 0516) Pulse Rate:  [63-72] 72 (11/28 0516) Resp:  [12-18] 18 (11/28 0516) BP: (101-167)/(35-78) 152/58 (11/28 0516) SpO2:  [94 %-99 %] 97 % (11/28 0516) Weight:  [59.3 kg] 59.3 kg (11/27 1249) Last BM Date : 11/06/22 Physical Exam Constitutional:      General: She is not in acute distress.    Appearance: She is not ill-appearing, toxic-appearing or diaphoretic.  Cardiovascular:     Rate and Rhythm: Normal rate and regular rhythm.  Pulmonary:     Effort: No respiratory distress.     Breath sounds: Normal breath sounds.  Abdominal:     General: Bowel sounds are normal. There is no distension.     Palpations: Abdomen is soft.     Tenderness: There is no abdominal tenderness. There is no guarding.  Neurological:     Mental Status: She is alert.     Labs: Recent Labs    11/05/22 1953 11/06/22 0109 11/07/22 0904  WBC 8.2 8.6 7.8  HGB 7.3* 9.6* 11.8*  HCT 24.5* 29.5* 36.7  PLT 127* 176 165   BMET Recent Labs    11/05/22 1953 11/06/22 0109 11/07/22 0904  NA 132* 132* 137  K 4.6 4.7 4.3  CL 104 101 107  CO2 17* 22 21*  GLUCOSE 95 96 102*  BUN 22 25* 13  CREATININE 0.89 1.00 0.76  CALCIUM 8.0* 9.1 9.4   LFT Recent Labs    11/06/22 0109  PROT 6.1*  ALBUMIN 3.3*  AST 18  ALT 15  ALKPHOS 78  BILITOT 0.9    PT/INR Recent Labs    11/06/22 0109  LABPROT 14.5  INR 1.1   Diagnostic imaging: DG Chest Port 1 View  Result Date: 11/05/2022 CLINICAL DATA:  Shortness of breath. Nausea, vomiting, and diarrhea for 3 days. EXAM: PORTABLE CHEST 1 VIEW COMPARISON:  10/06/2022. FINDINGS: The heart is enlarged and a TAVR stent is in place. Atherosclerotic calcification of the aorta is noted. No consolidation, effusion, or pneumothorax. There is limited visualization of the left costophrenic angle. No acute osseous abnormality. Multiple surgical clips are noted over the right chest. IMPRESSION: Cardiomegaly with no active disease. Electronically Signed   By: Regan Rakers.D.  On: 11/05/2022 23:57    IMPRESSION: Macrocytic anemia, improved             -B12 and folate within normal limits Fecal occult positive stool  -Status post EGD on 11/06/2022 with findings of gastritis (biopsied), duodenitis, linear erosions Thrombocytopenia Elevated total bilirubin Aortic stenosis with recent TAVR History C. difficile  PLAN: -Given findings on EGD 11/06/2022, recommend patient be on Protonix 40 mg p.o. daily x 8 weeks -Avoid all NSAIDs -Follow-up biopsy results to rule out H. pylori -No plans for endoscopic colonoscopy given prior failed attempts x 2, no plan for barium enema given patient having significant discomfort with this procedure in the past -If anemia persists and occult GI bleeding suspected, consider virtual CT colonoscopy and/or small bowel enteroscopy -Okay for discharge from GI standpoint -Can follow-up with [facility] in the outpatient setting as needed   LOS: 1 day   Danton Clap, DO [facility]

## 2022-11-07 NOTE — Progress Notes (Signed)
OT Cancellation Note  Patient Details Name: Barbara Russell MRN: [id number] DOB: May 10, 1930   Cancelled Treatment:    Reason Eval/Treat Not Completed: Other (comment) Patient in room reporting plan to d/c home with caregiver support today. Patient reported that she does not need OT services at this time or at home. OT signing off per patient request.  Rennie Plowman, [place] [facility] Office# [phone]  11/07/2022, 10:58 AM

## 2022-11-08 ENCOUNTER — Encounter (HOSPITAL_COMMUNITY): Payer: Self-pay | Admitting: Internal Medicine

## 2022-11-08 DIAGNOSIS — Z9010 Acquired absence of unspecified breast and nipple: Secondary | ICD-10-CM | POA: Diagnosis not present

## 2022-11-08 DIAGNOSIS — D631 Anemia in chronic kidney disease: Secondary | ICD-10-CM | POA: Diagnosis not present

## 2022-11-08 DIAGNOSIS — I2583 Coronary atherosclerosis due to lipid rich plaque: Secondary | ICD-10-CM | POA: Diagnosis not present

## 2022-11-08 DIAGNOSIS — G319 Degenerative disease of nervous system, unspecified: Secondary | ICD-10-CM | POA: Diagnosis not present

## 2022-11-08 DIAGNOSIS — N1831 Chronic kidney disease, stage 3a: Secondary | ICD-10-CM | POA: Diagnosis not present

## 2022-11-08 DIAGNOSIS — Z853 Personal history of malignant neoplasm of breast: Secondary | ICD-10-CM | POA: Diagnosis not present

## 2022-11-08 DIAGNOSIS — M810 Age-related osteoporosis without current pathological fracture: Secondary | ICD-10-CM | POA: Diagnosis not present

## 2022-11-08 DIAGNOSIS — E785 Hyperlipidemia, unspecified: Secondary | ICD-10-CM | POA: Diagnosis not present

## 2022-11-08 DIAGNOSIS — I081 Rheumatic disorders of both mitral and tricuspid valves: Secondary | ICD-10-CM | POA: Diagnosis not present

## 2022-11-08 DIAGNOSIS — K2970 Gastritis, unspecified, without bleeding: Secondary | ICD-10-CM | POA: Diagnosis not present

## 2022-11-08 DIAGNOSIS — I5032 Chronic diastolic (congestive) heart failure: Secondary | ICD-10-CM | POA: Diagnosis not present

## 2022-11-08 DIAGNOSIS — K219 Gastro-esophageal reflux disease without esophagitis: Secondary | ICD-10-CM | POA: Diagnosis not present

## 2022-11-08 DIAGNOSIS — I2510 Atherosclerotic heart disease of native coronary artery without angina pectoris: Secondary | ICD-10-CM | POA: Diagnosis not present

## 2022-11-08 DIAGNOSIS — Z9181 History of falling: Secondary | ICD-10-CM | POA: Diagnosis not present

## 2022-11-08 DIAGNOSIS — H3530 Unspecified macular degeneration: Secondary | ICD-10-CM | POA: Diagnosis not present

## 2022-11-08 DIAGNOSIS — Z952 Presence of prosthetic heart valve: Secondary | ICD-10-CM | POA: Diagnosis not present

## 2022-11-08 DIAGNOSIS — I130 Hypertensive heart and chronic kidney disease with heart failure and stage 1 through stage 4 chronic kidney disease, or unspecified chronic kidney disease: Secondary | ICD-10-CM | POA: Diagnosis not present

## 2022-11-13 DIAGNOSIS — K2970 Gastritis, unspecified, without bleeding: Secondary | ICD-10-CM | POA: Diagnosis not present

## 2022-11-13 DIAGNOSIS — D631 Anemia in chronic kidney disease: Secondary | ICD-10-CM | POA: Diagnosis not present

## 2022-11-13 DIAGNOSIS — I5032 Chronic diastolic (congestive) heart failure: Secondary | ICD-10-CM | POA: Diagnosis not present

## 2022-11-13 DIAGNOSIS — I2510 Atherosclerotic heart disease of native coronary artery without angina pectoris: Secondary | ICD-10-CM | POA: Diagnosis not present

## 2022-11-13 DIAGNOSIS — N1831 Chronic kidney disease, stage 3a: Secondary | ICD-10-CM | POA: Diagnosis not present

## 2022-11-13 DIAGNOSIS — I130 Hypertensive heart and chronic kidney disease with heart failure and stage 1 through stage 4 chronic kidney disease, or unspecified chronic kidney disease: Secondary | ICD-10-CM | POA: Diagnosis not present

## 2022-11-14 ENCOUNTER — Ambulatory Visit
Admission: RE | Admit: 2022-11-14 | Discharge: 2022-11-14 | Disposition: A | Discharge status: Home / Self Care | Payer: Medicare Other | Source: Ambulatory Visit | Attending: Obstetrics and Gynecology | Admitting: Obstetrics and Gynecology

## 2022-11-14 DIAGNOSIS — D242 Benign neoplasm of left breast: Secondary | ICD-10-CM | POA: Diagnosis not present

## 2022-11-14 DIAGNOSIS — N6320 Unspecified lump in the left breast, unspecified quadrant: Secondary | ICD-10-CM

## 2022-11-14 DIAGNOSIS — N6325 Unspecified lump in the left breast, overlapping quadrants: Secondary | ICD-10-CM | POA: Diagnosis not present

## 2022-11-14 HISTORY — PX: BREAST BIOPSY: SHX20

## 2022-11-14 NOTE — Progress Notes (Signed)
HE[facility] AND [place]                                     Cardiology Office Note:    Date:  11/17/2022   ID:  Barbara Russell, DOB [birth year], MRN [id number]  PCP:  Seward Carol, MD  [facility] Cardiologist:  Candee Furbish, MD / Dr. Angelena Form, MD (TAVR) [facility] Electrophysiologist:  None   Referring MD: Seward Carol, MD   Chief Complaint  Patient presents with   Follow-up    One month follow up s/p TAVR   History of Present Illness:    Barbara Russell is a 88 y.o. female with a hx of  anxiety, breast cancer s/p mastectomy, borderline DM, HTN, HLD, hyponatremia, and severe paradoxical LFLG who presented to [facility] on 10/10/22 for planned TAVR and is now seen for one month follow up.    She has been followed for moderate aortic stenosis by Dr. Marlou Porch and developed worsening dyspnea and chest pain. Echo 07/25/22 showed LVEF=60-65%, moderate LVH, mild MR and paradoxical LFLG AS with mean gradient 29 mmHg, peak gradient 51.3 mmHg, AVA 0.74 cm2, DI 0.24. [facility] 08/17/22 showed mild non obst CAD and normal heart pressures.    She was evaluated by the multidisciplinary valve team and felt to have severe, symptomatic aortic stenosis and to be a suitable candidate for TAVR. She is now s/p successful TAVR with a 23 mm Edwards Sapien 3 Ultra Resilia THV via the TF approach on 10/10/22. Post operative echo with no PVL and stable valve function with mean gradient of 8 mm Hg, peak gradient 13 mm Hg, DVI 0.67 and EOA 4.5 cm2. Restarted in home ASA '81mg'$  QD and discharged home 10/11/22.    She was seen in [facility] follow up with some mild visual changes however these have resolved since that time. Today she is here with her son and states that she has been well from a CV standpoint however was recently admitted after a dizzy episode and was admitted for symptomatic anemia with positive fecal occult stool and Hb at 7.3. She was transfused one unit PRBCs with  improvement. GI was consulted and she underwent EDG that showed gastritis but no acute or active bleeding. Her ASA was stopped and plan was to follow with GI in the OP setting. She was discharged 11/07/22.   She is very concerned about her GI bleeding. She does report some mild exertional SOB however felt to be secondary to recent events and age. She has no edema, palpitations, orthopnea, further dizziness, or syncope.    Past Medical History:  Diagnosis Date   Anxiety    Breast cancer ([place])    Hx of BC, s/p mastectomy on right 1999, s/p tamoxifen X1 year   Chest pain    EC Stress test 12/21/11 Low risk, no ischemia, normal EF   Chronic kidney disease    Coronary artery disease    Remote Hx of CAD, normal stress test 2009   Diverticulitis    GERD (gastroesophageal reflux disease)    No longer a problem   Hypercholesteremia    Hypertension    Insomnia    Lichen sclerosus    Low back pain    Myocardial infarction ([place]) 12/11/1998   Osteoarthrosis, unspecified whether generalized or localized, unspecified site    Pain    Several year Hx of pain/  SOB radiating to her neck   S/P TAVR (transcatheter aortic valve replacement) 10/10/2022   s/p TAVR with a 57m Edwards S3UR via the TF approach by Dr. MAngelena Form& Dr. ETenny Craw  Severe aortic stenosis    UTI (urinary tract infection)     Past Surgical History:  Procedure Laterality Date   ABDOMINAL HYSTERECTOMY     APPENDECTOMY     BIOPSY  11/06/2022   Procedure: BIOPSY;  Surgeon: VLoney Laurence DO;  [facility]: [facility];  Service: Gastroenterology;;   BREAST BIOPSY Left 11/14/2022   [place] BREAST BX W LOC DEV 1ST LESION IMG BX [place] 11/14/2022 [facility] MAMMOGRAPHY   ESOPHAGOGASTRODUODENOSCOPY N/A 11/06/2022   Procedure: ESOPHAGOGASTRODUODENOSCOPY (EGD);  Surgeon: VLoney Laurence DO;  [facility];  Service: Gastroenterology;  Laterality: N/A;   HAND SURGERY Bilateral    CTS and trigger finger on right and left    INTRAOPERATIVE TRANSTHORACIC ECHOCARDIOGRAM N/A 10/10/2022   Procedure: INTRAOPERATIVE TRANSTHORACIC ECHOCARDIOGRAM;  Surgeon: MBurnell Blanks MD;  [place]  Service: Open Heart Surgery;  Laterality: N/A;   MASTECTOMY Right 12/11/1997   REPLACEMENT TOTAL KNEE Right 11/10/2009   RIGHT HEART CATH AND CORONARY ANGIOGRAPHY N/A 08/17/2022   Procedure: RIGHT HEART CATH AND CORONARY ANGIOGRAPHY;  Surgeon: MBurnell Blanks MD;  [place] [facility];  Service: Cardiovascular;  Laterality: N/A;   TOTAL KNEE REVISION Right 05/17/2022   Procedure: Right knee polyethylene revision;  Surgeon: AGaynelle Arabian MD;  [facility];  Service: Orthopedics;  Laterality: Right;   TRANSCATHETER AORTIC VALVE REPLACEMENT, TRANSFEMORAL N/A 10/10/2022   Procedure: Transcatheter Aortic Valve Replacement, Transfemoral;  Surgeon: MBurnell Blanks MD;  [place]  Service: Open Heart Surgery;  Laterality: N/A;   ULTRASOUND GUIDANCE FOR VASCULAR ACCESS  10/10/2022   Procedure: ULTRASOUND GUIDANCE FOR VASCULAR ACCESS;  Surgeon: MBurnell Blanks MD;  [place]  Service: Open Heart Surgery;;   VESICOVAGINAL FISTULA CLOSURE W/ TAH  12/12/1967    Current Medications: Current Meds  Medication Sig   acetaminophen (TYLENOL) 325 MG tablet Take 650 mg by mouth every 6 (six) hours as needed for moderate pain or mild pain.   aspirin EC 81 MG tablet Take 1 tablet (81 mg total) by mouth daily. Swallow whole.   b complex vitamins capsule Take 1 capsule by mouth in the morning.   Camphor-Menthol-Methyl Sal (SALONPAS) 3.12-16-08 % PTCH Place 1 patch onto the skin daily as needed (pain).   cetirizine (ZYRTEC) 10 MG tablet Take 10 mg by mouth daily as needed for allergies or rhinitis.   Cholecalciferol (VITAMIN D3 PO) Take 1,000 Units by mouth in the morning.   CRANBERRY PO Take 1 tablet by mouth at bedtime.   furosemide (LASIX) 40 MG tablet Take 1 tablet (40 mg total) by mouth 2  (two) times daily for 3 days, THEN 1 tablet (40 mg total) daily. (Patient taking differently: Take 1 tablet (40 mg) daily  )   gabapentin (NEURONTIN) 300 MG capsule Take 600 mg by mouth at bedtime.    isosorbide mononitrate (IMDUR) 30 MG 24 hr tablet TAKE 1 TABLET BY MOUTH EVERY DAY   losartan (COZAAR) 50 MG tablet Take 1 tablet (50 mg total) by mouth 2 (two) times daily. (Patient taking differently: Take 50 mg by mouth See admin instructions. Take 1 tablet (50 mg) by mouth scheduled every morning, may repeat dose in the afternoon if blood pressure is elevated.)   Multiple Vitamins-Minerals (PRESERVISION AREDS  2) CAPS Take 1 capsule by mouth 2 (two) times daily.   nitroGLYCERIN (NITROSTAT) 0.4 MG SL tablet PLACE 1 TABLET UNDER THE TONGUE EVERY 5 MINUTES AS NEEDED FOR CHEST PAIN.   ONETOUCH ULTRA test strip CHECK ONCE DAILY AS DIRECTED   pantoprazole (PROTONIX) 40 MG tablet Take 1 tablet (40 mg total) by mouth 2 (two) times daily.   Polyethyl Glycol-Propyl Glycol (SYSTANE OP) Place 1 drop into both eyes in the morning and at bedtime.   saccharomyces boulardii (FLORASTOR) 250 MG capsule Take 500 mg by mouth daily in the afternoon.   spironolactone (ALDACTONE) 25 MG tablet Take 1 tablet (25 mg total) by mouth daily.   zolpidem (AMBIEN) 5 MG tablet Take 7.5 mg by mouth at bedtime.     Allergies:   Amlodipine, Benadryl [diphenhydramine hcl (sleep)], Broccoli flavor [flavoring agent], Brussels sprouts [brassica oleracea], Cabbage, Ciprofloxacin, Collard greens [wild lettuce extract (lactuca virosa)], Lactose intolerance (gi), Macrobid [nitrofurantoin monohyd macro], Naproxen, Quinine sulfate [quinine], Rosuvastatin, Sulfa antibiotics, Tramadol, Penicillins, and Prednisone   Social History   Socioeconomic History   Marital status: Widowed    Spouse name: Not on file   Number of children: 3   Years of education: Not on file   Highest education level: Not on file  Occupational History   Occupation:  Retired Pharmacist, hospital  Tobacco Use   Smoking status: Former    Packs/day: 1.00    Types: Cigarettes    Quit date: 07/08/1997    Years since quitting: 25.3   Smokeless tobacco: Never  Vaping Use   Vaping Use: Never used  Substance and Sexual Activity   Alcohol use: No   Drug use: No   Sexual activity: Not on file  Other Topics Concern   Not on file  Social History Narrative   Not on file   Social Determinants of Health   Financial Resource Strain: Not on file  Food Insecurity: No Food Insecurity (11/06/2022)   Hunger Vital Sign    Worried About Running Out of Food in the Last Year: Never true    Ran Out of Food in the Last Year: Never true  Transportation Needs: No Transportation Needs (11/06/2022)   PRAPARE - Hydrologist (Medical): No    Lack of Transportation (Non-Medical): No  Physical Activity: Not on file  Stress: Not on file  Social Connections: Not on file     Family History: The patient's family history includes Cancer in her mother; Heart disease in her father.  ROS:   Please see the history of present illness.    All other systems reviewed and are negative.  EKGs/Labs/Other Studies Reviewed:    The following studies were reviewed today:  Echocardiogram 11/15/22:   1. 23 mm S3. Vmax 2.0 m/s, 9.0 mmHG, EOA 1.84 cm2, DI 0.53. No  regurgitation or paravalvular leak. The aortic valve has been  repaired/replaced. Aortic valve regurgitation is not visualized. There is  a 23 mm Sapien prosthetic (TAVR) valve present in the  aortic position. Procedure Date: 10/10/2022. Echo findings are consistent  with normal structure and function of the aortic valve prosthesis.   2. Left ventricular ejection fraction, by estimation, is 65 to 70%. The  left ventricle has normal function. The left ventricle has no regional  wall motion abnormalities. Left ventricular diastolic parameters are  consistent with Grade I diastolic  dysfunction (impaired  relaxation).   3. Right ventricular systolic function is normal. The right ventricular  size is normal.  Tricuspid regurgitation signal is inadequate for assessing  PA pressure.   4. The mitral valve is grossly normal. Mild mitral valve regurgitation.  No evidence of mitral stenosis.   5. The inferior vena cava is normal in size with greater than 50%  respiratory variability, suggesting right atrial pressure of 3 mmHg.    TAVR OPERATIVE NOTE     Date of Procedure:                10/10/2022   Preoperative Diagnosis:      Severe Aortic Stenosis    Postoperative Diagnosis:    Same    Procedure:        Transcatheter Aortic Valve Replacement - Transfemoral Approach             Edwards Sapien 3 THV (size 23 mm, model # T562222, serial # [id number])              Co-Surgeons:                        Lauree Chandler, MD and Justice Rocher, MD   Anesthesiologist:                  [place]   Echocardiographer:              Gasper Sells   Pre-operative Echo Findings: Severe aortic stenosis Normal left ventricular systolic function   Post-operative Echo Findings: No paravalvular leak Normal left ventricular systolic function _____________   Echocardiogram 10/11/22:   1. The aortic valve is has been replaced by a 23 mm Sapien Valve. Aortic  valve regurgitation is not visualized. No aortic stenosis is present.  There is a 23 mm Edwards valve present in the aortic position. Echo  findings are consistent with normal  structure and function of the aortic valve prosthesis. Mean gradient of 8  mm Hg, Peak gradient 13 mm Hg, DVI 0.67 and EOA 4.5 cm2.   2. Left ventricular ejection fraction, by estimation, is 70 to 75%. The  left ventricle has hyperdynamic function. Left ventricular endocardial  border not optimally defined to evaluate regional wall motion. There is  moderate concentric left ventricular  hypertrophy. Left ventricular diastolic parameters are consistent with  Grade I  diastolic dysfunction (impaired relaxation).   3. Right ventricular systolic function is normal. The right ventricular  size is not well visualized.   4. MVA 4 cm2 by 2D Planimetry. The mitral valve is degenerative. Trivial  mitral valve regurgitation. The mean mitral valve gradient is 5.0 mmHg.  Severe mitral annular calcification.   5. Aortic Normal Ascending Aorta.   Comparison(s): Prior images reviewed side by side. Trivial PVL no longer  visualized.   EKG:  EKG is not ordered today.   Recent Labs: 11/05/2022: B Natriuretic Peptide 79.5 11/06/2022: ALT 15; Magnesium 2.1; TSH 1.773 11/15/2022: BUN 25; Creatinine, Ser 1.04; Hemoglobin 12.5; Platelets 211; Potassium 5.0; Sodium 134  Recent Lipid Panel No results found for: "CHOL", "TRIG", "HDL", "CHOLHDL", "VLDL", "LDLCALC", "LDLDIRECT"  Physical Exam:    VS:  BP (!) 152/66 (BP Location: Left Arm, Patient Position: Sitting, Cuff Size: Normal)   Pulse 66   Ht '5\' 2"'$  (1.575 m)   Wt 127 lb 12.8 oz (58 kg)   LMP  (LMP Unknown)   SpO2 97%   BMI 23.37 kg/m     Wt Readings from Last 3 Encounters:  11/15/22 127 lb 12.8 oz (58 kg)  11/06/22 130 lb 11.7 oz (59.3 kg)  10/24/22 131 lb (59.4 kg)    General: Well developed, well nourished, NAD Lungs:Clear to ausculation bilaterally. No wheezes, rales, or rhonchi. Breathing is unlabored. Cardiovascular: RRR with S1 S2. + soft systolic murmur Extremities: No edema.  Neuro: Alert and oriented. No focal deficits. No facial asymmetry. MAE spontaneously. Psych: Responds to questions appropriately with normal affect.    ASSESSMENT/PLAN:    Severe AS: Patient doing well with NYHA class II symptoms s/p successful TAVR with a 23 mm Edwards Sapien 3 Ultra Resilia THV via the TF approach on 10/10/22. Post operative echo with stable valve function, no PVL and mean gradient at 33mHg. Restarted on ASA 81 after GI bleed. Echo today with stable valve function and no PVL. Continue ASA '81mg'$ .  Requires  lifelong dental SBE with TAVR. Plan one year follow up with echocardiogram.   Symptomatic anemia: Admitted with dizziness found to have a Hb at 7.3 with + occult stool. EGD with gastritis. Started on Protonix. Will follow with OP GI.  CBC today with stable Hb   HTN: BP well controlled. Resumed on home meds including Imdur, spironolactone.     Pulmonary nodules: pre TAVR CT showed two solid pulmonary nodules, largest 0.9 cm in the left upper lobe. Recommend attention on follow-up chest CT in 3 months given history of breast cancer and absence of prior imaging. CT arranged.    Medication Adjustments/Labs and Tests Ordered: Current medicines are reviewed at length with the patient today.  Concerns regarding medicines are outlined above.  Orders Placed This Encounter  Procedures   CBC   Basic metabolic panel   Meds ordered this encounter  Medications   aspirin EC 81 MG tablet    Sig: Take 1 tablet (81 mg total) by mouth daily. Swallow whole.    Dispense:  90 tablet    Refill:  3    Patient Instructions  Medication Instructions:  START Aspirin '81mg'$  daily *If you need a refill on your cardiac medications before your next appointment, please call your pharmacy*   Lab Work: CBC, BMET today If you have labs (blood work) drawn today and your tests are completely normal, you will receive your results only by: [place](if you have MyChart) OR A paper copy in the mail If you have any lab test that is abnormal or we need to change your treatment, we will call you to review the results.   Testing/Procedures: NONE   Follow-Up: [facility] and your health needs are our priority.  As part of our continuing mission to provide you with exceptional heart care, we have created designated Provider Care Teams.  These Care Teams include your primary Cardiologist (physician) and Advanced Practice Providers (APPs -  Physician Assistants and Nurse Practitioners) who all work  together to provide you with the care you need, when you need it.  We recommend signing up for the patient portal called "MyChart".  Sign up information is provided on this After Visit Summary.  MyChart is used to connect with patients for Virtual Visits (Telemedicine).  Patients are able to view lab/test results, encounter notes, upcoming appointments, etc.  Non-urgent messages can be sent to your provider as well.   To learn more about what you can do with MyChart, go to [email or web address]    Your next appointment:   10/10/2023  The format for your next appointment:   In Person  Provider:   JKathyrn Drown NP  {    Important Information About Sugar  Signed, Kathyrn Drown, NP  11/17/2022 1:19 PM    [facility]

## 2022-11-15 ENCOUNTER — Ambulatory Visit (HOSPITAL_BASED_OUTPATIENT_CLINIC_OR_DEPARTMENT_OTHER): Payer: Medicare Other

## 2022-11-15 ENCOUNTER — Ambulatory Visit (HOSPITAL_COMMUNITY): Payer: Medicare Other | Attending: Cardiology | Admitting: Cardiology

## 2022-11-15 VITALS — BP 152/66 | HR 66 | Ht 62.0 in | Wt 127.8 lb

## 2022-11-15 DIAGNOSIS — R911 Solitary pulmonary nodule: Secondary | ICD-10-CM | POA: Diagnosis not present

## 2022-11-15 DIAGNOSIS — Z952 Presence of prosthetic heart valve: Secondary | ICD-10-CM | POA: Diagnosis not present

## 2022-11-15 DIAGNOSIS — N1831 Chronic kidney disease, stage 3a: Secondary | ICD-10-CM

## 2022-11-15 DIAGNOSIS — D649 Anemia, unspecified: Secondary | ICD-10-CM

## 2022-11-15 DIAGNOSIS — R918 Other nonspecific abnormal finding of lung field: Secondary | ICD-10-CM

## 2022-11-15 DIAGNOSIS — I10 Essential (primary) hypertension: Secondary | ICD-10-CM | POA: Diagnosis not present

## 2022-11-15 DIAGNOSIS — I2583 Coronary atherosclerosis due to lipid rich plaque: Secondary | ICD-10-CM | POA: Diagnosis not present

## 2022-11-15 DIAGNOSIS — I2510 Atherosclerotic heart disease of native coronary artery without angina pectoris: Secondary | ICD-10-CM | POA: Diagnosis not present

## 2022-11-15 LAB — ECHOCARDIOGRAM COMPLETE
AR max vel: 1.61 cm2
AV Area VTI: 1.84 cm2
AV Area mean vel: 1.74 cm2
AV Mean grad: 9 mmHg
AV Peak grad: 16.8 mmHg
Ao pk vel: 2.05 m/s
Area-P 1/2: 2.76 cm2
S' Lateral: 3.1 cm

## 2022-11-15 MED ORDER — ASPIRIN 81 MG PO TBEC: 81.0000 mg | DELAYED_RELEASE_TABLET | Freq: Every day | ORAL | 3 refills | Status: DC

## 2022-11-15 NOTE — Patient Instructions (Addendum)
Medication Instructions:  START Aspirin '81mg'$  daily *If you need a refill on your cardiac medications before your next appointment, please call your pharmacy*   Lab Work: CBC, BMET today If you have labs (blood work) drawn today and your tests are completely normal, you will receive your results only by: [place] (if you have MyChart) OR A paper copy in the mail If you have any lab test that is abnormal or we need to change your treatment, we will call you to review the results.   Testing/Procedures: NONE   Follow-Up: At [facility], you and your health needs are our priority.  As part of our continuing mission to provide you with exceptional heart care, we have created designated Provider Care Teams.  These Care Teams include your primary Cardiologist (physician) and Advanced Practice Providers (APPs -  Physician Assistants and Nurse Practitioners) who all work together to provide you with the care you need, when you need it.  We recommend signing up for the patient portal called "MyChart".  Sign up information is provided on this After Visit Summary.  MyChart is used to connect with patients for Virtual Visits (Telemedicine).  Patients are able to view lab/test results, encounter notes, upcoming appointments, etc.  Non-urgent messages can be sent to your provider as well.   To learn more about what you can do with MyChart, go to [email or web address].    Your next appointment:   10/10/2023  The format for your next appointment:   In Person  Provider:   Kathyrn Drown, NP  {    Important Information About Sugar

## 2022-11-16 DIAGNOSIS — K2970 Gastritis, unspecified, without bleeding: Secondary | ICD-10-CM | POA: Diagnosis not present

## 2022-11-16 DIAGNOSIS — I130 Hypertensive heart and chronic kidney disease with heart failure and stage 1 through stage 4 chronic kidney disease, or unspecified chronic kidney disease: Secondary | ICD-10-CM | POA: Diagnosis not present

## 2022-11-16 DIAGNOSIS — I2510 Atherosclerotic heart disease of native coronary artery without angina pectoris: Secondary | ICD-10-CM | POA: Diagnosis not present

## 2022-11-16 DIAGNOSIS — N1831 Chronic kidney disease, stage 3a: Secondary | ICD-10-CM | POA: Diagnosis not present

## 2022-11-16 DIAGNOSIS — D631 Anemia in chronic kidney disease: Secondary | ICD-10-CM | POA: Diagnosis not present

## 2022-11-16 DIAGNOSIS — I5032 Chronic diastolic (congestive) heart failure: Secondary | ICD-10-CM | POA: Diagnosis not present

## 2022-11-16 LAB — BASIC METABOLIC PANEL
BUN/Creatinine Ratio: 24 (ref 12–28)
BUN: 25 mg/dL (ref 10–36)
CO2: 27 mmol/L (ref 20–29)
Calcium: 10.2 mg/dL (ref 8.7–10.3)
Chloride: 92 mmol/L — ABNORMAL LOW (ref 96–106)
Creatinine, Ser: 1.04 mg/dL — ABNORMAL HIGH (ref 0.57–1.00)
Glucose: 100 mg/dL — ABNORMAL HIGH (ref 70–99)
Potassium: 5 mmol/L (ref 3.5–5.2)
Sodium: 134 mmol/L (ref 134–144)
eGFR: 51 mL/min/{1.73_m2} — ABNORMAL LOW (ref >59)

## 2022-11-16 LAB — CBC
Hematocrit: 38 % (ref 34.0–46.6)
Hemoglobin: 12.5 g/dL (ref 11.1–15.9)
MCH: 30.8 pg (ref 26.6–33.0)
MCHC: 32.9 g/dL (ref 31.5–35.7)
MCV: 94 fL (ref 79–97)
Platelets: 211 10*3/uL (ref 150–450)
RBC: 4.06 x10E6/uL (ref 3.77–5.28)
RDW: 12.7 % (ref 11.7–15.4)
WBC: 9.4 10*3/uL (ref 3.4–10.8)

## 2022-11-20 DIAGNOSIS — N1831 Chronic kidney disease, stage 3a: Secondary | ICD-10-CM | POA: Diagnosis not present

## 2022-11-20 DIAGNOSIS — D631 Anemia in chronic kidney disease: Secondary | ICD-10-CM | POA: Diagnosis not present

## 2022-11-20 DIAGNOSIS — K2970 Gastritis, unspecified, without bleeding: Secondary | ICD-10-CM | POA: Diagnosis not present

## 2022-11-20 DIAGNOSIS — I130 Hypertensive heart and chronic kidney disease with heart failure and stage 1 through stage 4 chronic kidney disease, or unspecified chronic kidney disease: Secondary | ICD-10-CM | POA: Diagnosis not present

## 2022-11-20 DIAGNOSIS — I2510 Atherosclerotic heart disease of native coronary artery without angina pectoris: Secondary | ICD-10-CM | POA: Diagnosis not present

## 2022-11-20 DIAGNOSIS — I5032 Chronic diastolic (congestive) heart failure: Secondary | ICD-10-CM | POA: Diagnosis not present

## 2022-11-23 DIAGNOSIS — N1831 Chronic kidney disease, stage 3a: Secondary | ICD-10-CM | POA: Diagnosis not present

## 2022-11-23 DIAGNOSIS — D631 Anemia in chronic kidney disease: Secondary | ICD-10-CM | POA: Diagnosis not present

## 2022-11-23 DIAGNOSIS — I5032 Chronic diastolic (congestive) heart failure: Secondary | ICD-10-CM | POA: Diagnosis not present

## 2022-11-23 DIAGNOSIS — I2510 Atherosclerotic heart disease of native coronary artery without angina pectoris: Secondary | ICD-10-CM | POA: Diagnosis not present

## 2022-11-23 DIAGNOSIS — K2970 Gastritis, unspecified, without bleeding: Secondary | ICD-10-CM | POA: Diagnosis not present

## 2022-11-23 DIAGNOSIS — I130 Hypertensive heart and chronic kidney disease with heart failure and stage 1 through stage 4 chronic kidney disease, or unspecified chronic kidney disease: Secondary | ICD-10-CM | POA: Diagnosis not present

## 2022-11-28 ENCOUNTER — Telehealth: Payer: Self-pay | Admitting: Cardiology

## 2022-11-28 NOTE — Telephone Encounter (Signed)
   Pre-operative Risk Assessment    Patient Name: Barbara Russell  DOB: [birth year] MRN: [id number]      Request for Surgical Clearance    Procedure: right total hip arthroplasty   Date of Surgery:  Clearance 12/27/22                                 Surgeon:  Dr. Hector Shade Surgeon's Group or Practice Name:  [facility] Phone number:  [phone] Fax number:  [phone]   Type of Clearance Requested:   - Medical    Type of Anesthesia:  Choice   Additional requests/questions:    SignedAnnabell Sabal   11/28/2022, 11:33 AM

## 2022-11-28 NOTE — H&P (Addendum)
TOTAL HIP ADMISSION H&P  Patient is admitted for right total hip arthroplasty.  Subjective:  Chief Complaint: Right hip pain  HPI: Barbara Russell, 88 y.o. female, has a history of pain and functional disability in the right hip due to arthritis and patient has failed non-surgical conservative treatments for greater than 12 weeks to include NSAID's and/or analgesics, corticosteriod injections, use of assistive devices, and activity modification. Onset of symptoms was gradual, starting several years ago with gradually worsening course since that time. The patient noted no past surgery on the right hip. Patient currently rates pain in the right hip at 8 out of 10 with activity. Patient has night pain, worsening of pain with activity and weight bearing, and pain that interfers with activities of daily living. Patient has evidence of  severe end-stage arthritis of the right hip with collapse of the femoral head  by imaging studies. This condition presents safety issues increasing the risk of falls. There is no current active infection.  Patient Active Problem List   Diagnosis Date Noted   Symptomatic anemia 11/05/2022   Dehydration 11/05/2022   Hypomagnesemia 11/05/2022   Hyponatremia 11/05/2022   S/P TAVR (transcatheter aortic valve replacement) 10/10/2022   Pulmonary nodules 10/10/2022   Benign essential hypertension 07/27/2022   Chronic kidney disease, stage 3a ([place]) 07/27/2022   Diarrhea 07/27/2022   Gastroesophageal reflux disease 07/27/2022   Insomnia 07/27/2022   Breast cancer ([place]) 02/02/2021   Hypertension 02/23/2020   Coronary artery disease due to lipid rich plaque 11/26/2014   Severe aortic stenosis 04/07/2014   Diabetes ([place]) 04/07/2014    Past Medical History:  Diagnosis Date   Anxiety    Breast cancer ([place])    Hx of BC, s/p mastectomy on right 1999, s/p tamoxifen X1 year   Chest pain    EC Stress test 12/21/11 Low risk, no ischemia, normal EF   Chronic kidney disease     Coronary artery disease    Remote Hx of CAD, normal stress test 2009   Diverticulitis    GERD (gastroesophageal reflux disease)    No longer a problem   Hypercholesteremia    Hypertension    Insomnia    Lichen sclerosus    Low back pain    Myocardial infarction ([place]) 12/11/1998   Osteoarthrosis, unspecified whether generalized or localized, unspecified site    Pain    Several year Hx of pain/ SOB radiating to her neck   S/P TAVR (transcatheter aortic valve replacement) 10/10/2022   s/p TAVR with a 85m Edwards S3UR via the TF approach by Dr. MAngelena Form& Dr. ETenny Craw  Severe aortic stenosis    UTI (urinary tract infection)     Past Surgical History:  Procedure Laterality Date   ABDOMINAL HYSTERECTOMY     APPENDECTOMY     BIOPSY  11/06/2022   Procedure: BIOPSY;  Surgeon: VLoney Laurence DO;  Location: [facility];  Service: Gastroenterology;;   BREAST BIOPSY Left 11/14/2022   [place] BREAST BX W LOC DEV 1ST LESION IMG BX [place]IDE 11/14/2022 [facility]   ESOPHAGOGASTRODUODENOSCOPY N/A 11/06/2022   Procedure: ESOPHAGOGASTRODUODENOSCOPY (EGD);  Surgeon: VLoney Laurence DO;  [facility];  Service: Gastroenterology;  Laterality: N/A;   HAND SURGERY Bilateral    CTS and trigger finger on right and left   INTRAOPERATIVE TRANSTHORACIC ECHOCARDIOGRAM N/A 10/10/2022   Procedure: INTRAOPERATIVE TRANSTHORACIC ECHOCARDIOGRAM;  Surgeon: MBurnell Blanks MD;  [facility]  Service: Open Heart Surgery;  Laterality: N/A;  MASTECTOMY Right 12/11/1997   REPLACEMENT TOTAL KNEE Right 11/10/2009   RIGHT HEART CATH AND CORONARY ANGIOGRAPHY N/A 08/17/2022   Procedure: RIGHT HEART CATH AND CORONARY ANGIOGRAPHY;  Surgeon: Burnell Blanks, MD;  Location: [facility];  Service: Cardiovascular;  Laterality: N/A;   TOTAL KNEE REVISION Right 05/17/2022   Procedure: Right knee polyethylene revision;  Surgeon: Gaynelle Arabian, MD;  Location: [facility];   Service: Orthopedics;  Laterality: Right;   TRANSCATHETER AORTIC VALVE REPLACEMENT, TRANSFEMORAL N/A 10/10/2022   Procedure: Transcatheter Aortic Valve Replacement, Transfemoral;  Surgeon: Burnell Blanks, MD;  Location: [facility];  Service: Open Heart Surgery;  Laterality: N/A;   ULTRASOUND GUIDANCE FOR VASCULAR ACCESS  10/10/2022   Procedure: ULTRASOUND GUIDANCE FOR VASCULAR ACCESS;  Surgeon: Burnell Blanks, MD;  Location: [facility];  Service: Open Heart Surgery;;   VESICOVAGINAL FISTULA CLOSURE W/ TAH  12/12/1967    Prior to Admission medications   Medication Sig Start Date End Date Taking? Authorizing Provider  acetaminophen (TYLENOL) 325 MG tablet Take 650 mg by mouth every 6 (six) hours as needed for moderate pain or mild pain.    [provider]  aspirin EC 81 MG tablet Take 1 tablet (81 mg total) by mouth daily. Swallow whole. 11/15/22   Kathyrn Drown D, NP  b complex vitamins capsule Take 1 capsule by mouth in the morning.    [provider]  Camphor-Menthol-Methyl Sal (SALONPAS) 3.12-16-08 % PTCH Place 1 patch onto the skin daily as needed (pain).    [provider]  cetirizine (ZYRTEC) 10 MG tablet Take 10 mg by mouth daily as needed for allergies or rhinitis.    [provider]  Cholecalciferol (VITAMIN D3 PO) Take 1,000 Units by mouth in the morning.    [provider]  CRANBERRY PO Take 1 tablet by mouth at bedtime.    [provider]  furosemide (LASIX) 40 MG tablet Take 1 tablet (40 mg total) by mouth 2 (two) times daily for 3 days, THEN 1 tablet (40 mg total) daily. Patient taking differently: Take 1 tablet (40 mg) daily   10/24/22 10/19/23  Jerline Pain, MD  gabapentin (NEURONTIN) 300 MG capsule Take 600 mg by mouth at bedtime.     [provider]  isosorbide mononitrate (IMDUR) 30 MG 24 hr tablet TAKE 1 TABLET BY MOUTH EVERY DAY 08/22/22   Jerline Pain, MD  losartan (COZAAR) 50 MG tablet Take 1 tablet  (50 mg total) by mouth 2 (two) times daily. Patient taking differently: Take 50 mg by mouth See admin instructions. Take 1 tablet (50 mg) by mouth scheduled every morning, may repeat dose in the afternoon if blood pressure is elevated. 02/23/22   Jerline Pain, MD  Multiple Vitamins-Minerals (PRESERVISION AREDS 2) CAPS Take 1 capsule by mouth 2 (two) times daily.    [provider]  nitroGLYCERIN (NITROSTAT) 0.4 MG SL tablet PLACE 1 TABLET UNDER THE TONGUE EVERY 5 MINUTES AS NEEDED FOR CHEST PAIN. 04/11/22   Jerline Pain, MD  ondansetron (ZOFRAN) 4 MG tablet Take 4 mg by mouth every 4 (four) hours as needed for vomiting or nausea. Patient not taking: Reported on 11/15/2022    [provider]  [place] ULTRA test strip CHECK ONCE DAILY AS DIRECTED 05/29/21   [provider]  pantoprazole (PROTONIX) 40 MG tablet Take 1 tablet (40 mg total) by mouth 2 (two) times daily. 11/07/22   Dibia, Manfred Shirts, MD  Polyethyl Glycol-Propyl Glycol (  SYSTANE OP) Place 1 drop into both eyes in the morning and at bedtime.    [provider]  saccharomyces boulardii (FLORASTOR) 250 MG capsule Take 500 mg by mouth daily in the afternoon. 12/23/21   [provider]  spironolactone (ALDACTONE) 25 MG tablet Take 1 tablet (25 mg total) by mouth daily. 09/07/22   Jerline Pain, MD  zolpidem (AMBIEN) 5 MG tablet Take 7.5 mg by mouth at bedtime.    [provider]    Allergies  Allergen Reactions   Amlodipine Swelling    LEE with '5mg'$  dose  Foot swelling   Benadryl [Diphenhydramine Hcl (Sleep)] Other (See Comments)    Hyperstimulation   Broccoli Flavor [Flavoring Agent] Diarrhea   Brussels Sprouts [Brassica Oleracea] Diarrhea   Cabbage Diarrhea   Ciprofloxacin Hives   Collard Greens [Wild Lettuce Extract (Lactuca Virosa)] Diarrhea   Lactose Intolerance (Gi) Diarrhea   Macrobid [Nitrofurantoin Monohyd Macro] Nausea And Vomiting   Naproxen Other (See Comments)     Stomach pain   Quinine Sulfate [Quinine] Hives   Rosuvastatin     headache   Sulfa Antibiotics Hives        Tramadol Other (See Comments)    unknown to patient   Penicillins Rash    Has patient had a PCN reaction causing immediate rash, facial/tongue/throat swelling, SOB or lightheadedness with hypotension: Yes Has patient had a PCN reaction causing severe rash involving mucus membranes or skin necrosis: No Has patient had a PCN reaction that required hospitalization: No Has patient had a PCN reaction occurring within the last 10 years: No If all of the above answers are "NO", then may proceed with Cephalosporin use. Tolerated Cephalosporin Date: 05/19/22.     Prednisone Other (See Comments)    Weakness and pain in joints    Social History   Socioeconomic History   Marital status: Widowed    Spouse name: Not on file   Number of children: 3   Years of education: Not on file   Highest education level: Not on file  Occupational History   Occupation: Retired Pharmacist, hospital  Tobacco Use   Smoking status: Former    Packs/day: 1.00    Types: Cigarettes    Quit date: 07/08/1997    Years since quitting: 25.4   Smokeless tobacco: Never  Vaping Use   Vaping Use: Never used  Substance and Sexual Activity   Alcohol use: No   Drug use: No   Sexual activity: Not on file  Other Topics Concern   Not on file  Social History Narrative   Not on file   Social Determinants of Health   Financial Resource Strain: Not on file  Food Insecurity: No Food Insecurity (11/06/2022)   Hunger Vital Sign    Worried About Running Out of Food in the Last Year: Never true    Ran Out of Food in the Last Year: Never true  Transportation Needs: No Transportation Needs (11/06/2022)   PRAPARE - Hydrologist (Medical): No    Lack of Transportation (Non-Medical): No  Physical Activity: Not on file  Stress: Not on file  Social Connections: Not on file  Intimate Partner Violence:  Not At Risk (11/06/2022)   Humiliation, Afraid, Rape, and Kick questionnaire    Fear of Current or Ex-Partner: No    Emotionally Abused: No    Physically Abused: No    Sexually Abused: No    Tobacco Use: Medium Risk (11/15/2022)   Patient  History    Smoking Tobacco Use: Former    Smokeless Tobacco Use: Never    Passive Exposure: Not on file   Social History   Substance and Sexual Activity  Alcohol Use No    Family History  Problem Relation Age of Onset   Cancer Mother        cervical   Heart disease Father     Review of Systems  Constitutional:  Negative for chills and fever.  HENT: Negative.    Eyes: Negative.   Respiratory:  Negative for cough and shortness of breath.   Cardiovascular:  Negative for chest pain and palpitations.  Gastrointestinal:  Negative for abdominal pain, nausea and vomiting.  Genitourinary:  Negative for dysuria, frequency and urgency.  Musculoskeletal:  Positive for joint pain.  Skin:  Negative for rash.   Objective:  Physical Exam: Well nourished and well developed.  General: Alert and oriented x3, cooperative and pleasant, no acute distress.  Head: normocephalic, atraumatic, neck supple.  Eyes: EOMI. Abdomen: non-tender to palpation and soft, normoactive bowel sounds. Musculoskeletal: Right Hip Exam:  The range of motion: Flexion to 100 degrees, Internal Rotation is minimal, External Rotation to minimal, and abduction to 20 degrees with pain on range of motion.  There is no tenderness over the greater trochanteric bursa.  There is no pain on provocative testing of the hip  Calves soft and nontender. Motor function intact in LE. Strength 5/5 LE bilaterally. Neuro: Distal pulses 2+. Sensation to light touch intact in LE.  Vital signs in last 24 hours: BP: ()/()  Arterial Line BP: ()/()   Imaging Review Plain radiographs demonstrate severe degenerative joint disease of the right hip. The bone quality appears to be adequate for age and  reported activity level.  Assessment/Plan:  End stage arthritis, right hip  The patient history, physical examination, clinical judgement of the provider and imaging studies are consistent with end stage degenerative joint disease of the right hip and total hip arthroplasty is deemed medically necessary. The treatment options including medical management, injection therapy, arthroscopy and arthroplasty were discussed at length. The risks and benefits of total hip arthroplasty were presented and reviewed. The risks due to aseptic loosening, infection, stiffness, dislocation/subluxation, thromboembolic complications and other imponderables were discussed. The patient acknowledged the explanation, agreed to proceed with the plan and consent was signed. Patient is being admitted for inpatient treatment for surgery, pain control, PT, OT, prophylactic antibiotics, VTE prophylaxis, progressive ambulation and ADLs and discharge planning.The patient is planning to be discharged  home .  Therapy Plans: HEP vs HHPT with [facility] Disposition: Home with Daughter Planned DVT Prophylaxis: Xarelto 10 mg (hx breast cancer) DME Needed: None PCP: Seward Carol, MD (contacting for clearance) Cardiologist: Candee Furbish, MD (clearance in EPIC telephone note 12/19) TXA: IV Allergies: amlodipine (swelling), naproxen, penicillins (rash) Anesthesia Concerns: N/V BMI: 23.6 Last HgbA1c: unsure - will recheck with pre-op labs  [facility]  Other: -contacting PCP and cardiologist for clearance -Hx TAVR (09/30/2022) and hx of CAD from 2000 -Hx breast cancer - no IV sticks or BP in right arm  - Patient was instructed on what medications to stop prior to surgery. - Follow-up visit in 2 weeks with Dr. Wynelle Link - Begin physical therapy following surgery - Pre-operative lab work as pre-surgical testing - Prescriptions will be provided in hospital at time of discharge  R. Jaynie Bream,  PA-C Orthopedic Surgery [facility] [place]

## 2022-11-29 NOTE — Telephone Encounter (Signed)
   Patient Name: Barbara Russell  DOB: [birth year] MRN: [id number]  Primary Cardiologist: Candee Furbish, MD  Chart reviewed as part of pre-operative protocol coverage. Patient is scheduled to have a right total hip arthroplasty on 12/27/2022. She was recently seen by Dr. Marlou Porch on 10/24/2022 at which time Dr. Marlou Porch commented on plans for hip surgery. Per his note, "Planning on hip surgery in January. This seems reasonable. She is able to proceed from a cardiac perspective." Following this visit, it does look like she was admitted for acute anemia due to a GI bleed. Hemoglobin has since stabilized but would also recommend reaching out to PCP for medical clearance prior to surgery.  I will route this recommendation to the requesting party via Epic fax function and remove from pre-op pool.  Please call with questions.  Darreld Mclean, PA-C 11/29/2022, 7:06 AM

## 2022-12-05 ENCOUNTER — Telehealth: Payer: Self-pay | Admitting: Cardiology

## 2022-12-05 NOTE — Telephone Encounter (Signed)
Patient is calling requesting a callback from Sharee Pimple to discuss why she ordered a CT. Please advise.

## 2022-12-05 NOTE — Telephone Encounter (Signed)
I spoke to patient and she is aware of reasoning and willing to proceed.

## 2022-12-06 ENCOUNTER — Encounter: Payer: Self-pay | Admitting: Cardiology

## 2022-12-06 NOTE — Telephone Encounter (Signed)
Patient is following up, again she requests a call from [place], NP, in regards to upcoming CT. She states she has additional questions and she would prefer to discuss with NP or an associate.

## 2022-12-06 NOTE — Telephone Encounter (Signed)
I personally called the Barbara Russell 12/26 and explained this to her and she was agreeable. I called her again today and spent 15 minutes on the phone with her explaining to her that we found a pulmonary nodule on pre TAVR CT scans and 3 month follow up was recommended. Kathyrn Drown documented that she spoke to the Barbara Russell about this on 12/6. She told me she was never told about any lung nodule and very very upset. She said her son was present and also doesn't remember anything about a pulmonary nodule. She is also upset that the CTs were done in September and she saw both Sharee Pimple and Dr. Marlou Porch with no mention of this nodules. She is mostly upset that the CT scan is scheduled on 1/12 which is 5 days before her hip surgery and she doesn't want to get her hip done if she is "dying from lung cancer." I tried to reassure her that the pulmonary nodule is small and could turn out to be nothing and that the only way we can know if If she gets the CT scan. She asked to be emergently added to Dr. Marlou Porch schedule to discuss his opinion on the lung nodule. I told her he does not have an opening until Feb and I am confident he will just provide reassurance and advise her to proceed with follow up CT as planned. She asked that Dr. Magdalen Spatz be notified about our conversation today and will plan to keep the CT scan that is scheduled for 1/12.  Angelena Form

## 2022-12-06 NOTE — Telephone Encounter (Signed)
Called pt in regards to CT scan. Pt reports was not told why CT scan was needed.  Only found out when scheduler called to set up appointment.  Pt is upset and wants to know why she was not notified of need for CT.    Also concerned that is scheduled to have hip surgery on 12/27/22.  CT is scheduled for 12/22/22.  This is a lot for pt to deal with.  Is upset and would like to speak with Sharee Pimple.    Advised pt of 08/30/22 CT angio results:  3. Two solid pulmonary nodules, largest 0.9 cm in the left upper lobe. Recommend attention on follow-up chest CT in 3 months given history of breast cancer and absence of prior imaging.  Will send to [facility] to follow up.

## 2022-12-12 ENCOUNTER — Ambulatory Visit (HOSPITAL_COMMUNITY)
Admission: RE | Admit: 2022-12-12 | Discharge: 2022-12-12 | Disposition: A | Discharge status: Home / Self Care | Payer: Medicare Other | Source: Ambulatory Visit | Attending: Cardiology | Admitting: Cardiology

## 2022-12-12 DIAGNOSIS — R918 Other nonspecific abnormal finding of lung field: Secondary | ICD-10-CM | POA: Diagnosis not present

## 2022-12-13 NOTE — Patient Instructions (Addendum)
SURGICAL WAITING ROOM VISITATION  Patients having surgery or a procedure may have no more than 2 support people in the waiting area - these visitors may rotate.    Children under the age of 66 must have an adult with them who is not the patient.  Due to an increase in RSV and influenza rates and associated hospitalizations, children ages 25 and under may not visit patients in [place].  If the patient needs to stay at the hospital during part of their recovery, the visitor guidelines for inpatient rooms apply. Pre-op nurse will coordinate an appropriate time for 1 support person to accompany patient in pre-op.  This support person may not rotate.    Please refer to the [facility] website for the visitor guidelines for Inpatients (after your surgery is over and you are in a regular room).       Your procedure is scheduled on:  12/27/2022    Report to [facility] Main Entrance    Report to admitting at   11:15 AM   Call this number if you have problems the morning of surgery [phone]   Do not eat food :After Midnight.   After Midnight you may have the following liquids until 10:45 AM  DAY OF SURGERY  Water Non-Citrus Juices (without pulp, NO RED-Apple, White grape, White cranberry) Black Coffee (NO MILK/CREAM OR CREAMERS, sugar ok)  Clear Tea (NO MILK/CREAM OR CREAMERS, sugar ok) regular and decaf                             Plain Jell-O (NO RED)                                           Fruit ices (not with fruit pulp, NO RED)                                     Popsicles (NO RED)                                                               Sports drinks like Gatorade (NO RED)                The day of surgery:  Drink ONE (1) Pre-Surgery G2 at 1045 AM   ( have completed by ) the morning of surgery. Drink in one sitting. Do not sip.  This drink was given to you during your hospital  pre-op appointment visit. Nothing else to drink after completing the  Pre-Surgery G2.          If you have questions, please contact your surgeon's office.    Oral Hygiene is also important to reduce your risk of infection.                                    Remember - BRUSH YOUR TEETH THE MORNING OF SURGERY WITH YOUR REGULAR TOOTHPASTE  DENTURES WILL BE REMOVED PRIOR TO SURGERY PLEASE DO  NOT APPLY "Poly grip" OR ADHESIVES!!!   Do NOT smoke after Midnight   Take these medicines the morning of surgery with A SIP OF WATER:  Zyrtec Isosorbide Tylenol if needed                               You may not have any metal on your body including hair pins, jewelry, and body piercing             Do not wear make-up, lotions, powders, perfumes or deodorant  Do not wear nail polish including gel and S&S, artificial/acrylic nails, or any other type of covering on natural nails including finger and toenails. If you have artificial nails, gel coating, etc. that needs to be removed by a nail salon please have this removed prior to surgery or surgery may need to be canceled/ delayed if the surgeon/ anesthesia feels like they are unable to be safely monitored.   Do not shave  48 hours prior to surgery.    Do not bring valuables to the hospital. [place].   Contacts, glasses, dentures or bridgework may not be worn into surgery.   Bring small overnight bag day of surgery.   DO NOT [place]. PHARMACY WILL DISPENSE MEDICATIONS LISTED ON YOUR MEDICATION LIST TO YOU DURING YOUR ADMISSION [place]!    Special Instructions: Bring a copy of your healthcare power of attorney and living will documents the day of surgery if you haven't scanned them before.              Please read over the following fact sheets you were given: IF [place]   If you received a COVID test during your pre-op visit  it is requested that you wear a  mask when out in public, stay away from anyone that may not be feeling well and notify your surgeon if you develop symptoms. If you test positive for Covid or have been in contact with anyone that has tested positive in the last 10 days please notify you surgeon.    [facility] - Preparing for Surgery Before surgery, you can play an important role.  Because skin is not sterile, your skin needs to be as free of germs as possible.  You can reduce the number of germs on your skin by washing with CHG (chlorahexidine gluconate) soap before surgery.  CHG is an antiseptic cleaner which kills germs and bonds with the skin to continue killing germs even after washing. Please DO NOT use if you have an allergy to CHG or antibacterial soaps.  If your skin becomes reddened/irritated stop using the CHG and inform your nurse when you arrive at Short Stay. Do not shave (including legs and underarms) for at least 48 hours prior to the first CHG shower.  You may shave your face/neck. Please follow these instructions carefully:  1.  Shower with CHG Soap the night before surgery and the  morning of Surgery.  2.  If you choose to wash your hair, wash your hair first as usual with your  normal  shampoo.  3.  After you shampoo, rinse your hair and body thoroughly to remove the  shampoo.  4.  Use CHG as you would any other liquid soap.  You can apply chg directly  to the skin and wash                       Gently with a scrungie or clean washcloth.  5.  Apply the CHG Soap to your body ONLY FROM THE NECK DOWN.   Do not use on face/ open                           Wound or open sores. Avoid contact with eyes, ears mouth and genitals (private parts).                       Wash face,  Genitals (private parts) with your normal soap.             6.  Wash thoroughly, paying special attention to the area where your surgery  will be performed.  7.  Thoroughly rinse your body with warm water from the neck  down.  8.  DO NOT shower/wash with your normal soap after using and rinsing off  the CHG Soap.                9.  Pat yourself dry with a clean towel.            10.  Wear clean pajamas.            11.  Place clean sheets on your bed the night of your first shower and do not  sleep with pets. Day of Surgery : Do not apply any lotions/deodorants the morning of surgery.  Please wear clean clothes to the hospital/surgery center.  FAILURE TO FOLLOW THESE INSTRUCTIONS MAY RESULT IN THE CANCELLATION OF YOUR SURGERY PATIENT SIGNATURE_________________________________  NURSE SIGNATURE__________________________________  ________________________________________________________________________   Adam Phenix  An incentive spirometer is a tool that can help keep your lungs clear and active. This tool measures how well you are filling your lungs with each breath. Taking long deep breaths may help reverse or decrease the chance of developing breathing (pulmonary) problems (especially infection) following: A long period of time when you are unable to move or be active. BEFORE THE PROCEDURE  If the spirometer includes an indicator to show your best effort, your nurse or respiratory therapist will set it to a desired goal. If possible, sit up straight or lean slightly forward. Try not to slouch. Hold the incentive spirometer in an upright position. INSTRUCTIONS FOR USE  Sit on the edge of your bed if possible, or sit up as far as you can in bed or on a chair. Hold the incentive spirometer in an upright position. Breathe out normally. Place the mouthpiece in your mouth and seal your lips tightly around it. Breathe in slowly and as deeply as possible, raising the piston or the ball toward the top of the column. Hold your breath for 3-5 seconds or for as long as possible. Allow the piston or ball to fall to the bottom of the column. Remove the mouthpiece from your mouth and breathe out  normally. Rest for a few seconds and repeat Steps 1 through 7 at least 10 times every 1-2 hours when you are awake. Take your time and take a few normal breaths between deep breaths. The spirometer may include an indicator to show your best effort. Use the indicator as a goal to work toward  during each repetition. After each set of 10 deep breaths, practice coughing to be sure your lungs are clear. If you have an incision (the cut made at the time of surgery), support your incision when coughing by placing a pillow or rolled up towels firmly against it. Once you are able to get out of bed, walk around indoors and cough well. You may stop using the incentive spirometer when instructed by your caregiver.  RISKS AND COMPLICATIONS Take your time so you do not get dizzy or light-headed. If you are in pain, you may need to take or ask for pain medication before doing incentive spirometry. It is harder to take a deep breath if you are having pain. AFTER USE Rest and breathe slowly and easily. It can be helpful to keep track of a log of your progress. Your caregiver can provide you with a simple table to help with this. If you are using the spirometer at home, follow these instructions: [place] IF:  You are having difficultly using the spirometer. You have trouble using the spirometer as often as instructed. Your pain medication is not giving enough relief while using the spirometer. You develop fever of 100.5 F (38.1 C) or higher. SEEK IMMEDIATE MEDICAL CARE IF:  You cough up bloody sputum that had not been present before. You develop fever of 102 F (38.9 C) or greater. You develop worsening pain at or near the incision site. MAKE SURE YOU:  Understand these instructions. Will watch your condition. Will get help right away if you are not doing well or get worse. Document Released: 04/09/2007 Document Revised: 02/19/2012 Document Reviewed: 06/10/2007 ExitCare Patient Information  2014 ExitCare, [place].   ________________________________________________________________________ WHAT IS A BLOOD TRANSFUSION? Blood Transfusion Information  A transfusion is the replacement of blood or some of its parts. Blood is made up of multiple cells which provide different functions. Red blood cells carry oxygen and are used for blood loss replacement. White blood cells fight against infection. Platelets control bleeding. Plasma helps clot blood. Other blood products are available for specialized needs, such as hemophilia or other clotting disorders. BEFORE THE TRANSFUSION  Who gives blood for transfusions?  Healthy volunteers who are fully evaluated to make sure their blood is safe. This is blood bank blood. Transfusion therapy is the safest it has ever been in the practice of medicine. Before blood is taken from a donor, a complete history is taken to make sure that person has no history of diseases nor engages in risky social behavior (examples are intravenous drug use or sexual activity with multiple partners). The donor's travel history is screened to minimize risk of transmitting infections, such as malaria. The donated blood is tested for signs of infectious diseases, such as HIV and hepatitis. The blood is then tested to be sure it is compatible with you in order to minimize the chance of a transfusion reaction. If you or a relative donates blood, this is often done in anticipation of surgery and is not appropriate for emergency situations. It takes many days to process the donated blood. RISKS AND COMPLICATIONS Although transfusion therapy is very safe and saves many lives, the main dangers of transfusion include:  Getting an infectious disease. Developing a transfusion reaction. This is an allergic reaction to something in the blood you were given. Every precaution is taken to prevent this. The decision to have a blood transfusion has been considered carefully by your caregiver  before blood is given. Blood is not given  unless the benefits outweigh the risks. AFTER THE TRANSFUSION Right after receiving a blood transfusion, you will usually feel much better and more energetic. This is especially true if your red blood cells have gotten low (anemic). The transfusion raises the level of the red blood cells which carry oxygen, and this usually causes an energy increase. The nurse administering the transfusion will monitor you carefully for complications. HOME CARE INSTRUCTIONS  No special instructions are needed after a transfusion. You may find your energy is better. Speak with your caregiver about any limitations on activity for underlying diseases you may have. SEEK MEDICAL CARE IF:  Your condition is not improving after your transfusion. You develop redness or irritation at the intravenous (IV) site. SEEK IMMEDIATE MEDICAL CARE IF:  Any of the following symptoms occur over the next 12 hours: Shaking chills. You have a temperature by mouth above 102 F (38.9 C), not controlled by medicine. Chest, back, or muscle pain. People around you feel you are not acting correctly or are confused. Shortness of breath or difficulty breathing. Dizziness and fainting. You get a rash or develop hives. You have a decrease in urine output. Your urine turns a dark color or changes to pink, red, or brown. Any of the following symptoms occur over the next 10 days: You have a temperature by mouth above 102 F (38.9 C), not controlled by medicine. Shortness of breath. Weakness after normal activity. The white part of the eye turns yellow (jaundice). You have a decrease in the amount of urine or are urinating less often. Your urine turns a dark color or changes to pink, red, or brown. Document Released: 11/24/2000 Document Revised: 02/19/2012 Document Reviewed: 07/13/2008 [facility] Patient Information 2014 [place],  [place].  _______________________________________________________________________

## 2022-12-14 ENCOUNTER — Other Ambulatory Visit: Payer: Self-pay | Admitting: Cardiology

## 2022-12-14 ENCOUNTER — Telehealth: Payer: Self-pay | Admitting: Cardiology

## 2022-12-14 DIAGNOSIS — R918 Other nonspecific abnormal finding of lung field: Secondary | ICD-10-CM

## 2022-12-14 NOTE — Telephone Encounter (Signed)
Spoke with patient regarding CT results. She understands that there was small growth in the left lower lung nodule since last CT scan. Recommendations were for pulmonary referral. She is scheduled to have hip surgery mid month and is mostly worried if she she pursue this. Recommend that she should certainly continue with her surgery and we can make a referral for consult after she is recovered from surgery. Patient agrees with plan.   Kathyrn Drown NP-C Structural Heart Team  Phone: [phone]

## 2022-12-14 NOTE — Telephone Encounter (Signed)
Spoke with patient regarding CT results and answered upcoming hip surgery question. Thank you!

## 2022-12-14 NOTE — Telephone Encounter (Signed)
Patient calling wanting to speak with the nurse/dr about her hip surgery. Unsure if she needs to the the pulmonary dr before or after her surgery. Please advise

## 2022-12-18 ENCOUNTER — Encounter (HOSPITAL_COMMUNITY): Payer: Self-pay

## 2022-12-18 ENCOUNTER — Other Ambulatory Visit: Payer: Self-pay | Admitting: Cardiology

## 2022-12-18 ENCOUNTER — Other Ambulatory Visit: Payer: Self-pay

## 2022-12-18 ENCOUNTER — Encounter (HOSPITAL_COMMUNITY)
Admission: RE | Admit: 2022-12-18 | Discharge: 2022-12-18 | Disposition: A | Discharge status: Home / Self Care | Payer: Medicare Other | Source: Ambulatory Visit | Attending: Orthopedic Surgery | Admitting: Orthopedic Surgery

## 2022-12-18 VITALS — BP 153/57 | HR 65 | Temp 98.2°F | Resp 12 | Ht 62.0 in | Wt 126.0 lb

## 2022-12-18 DIAGNOSIS — I129 Hypertensive chronic kidney disease with stage 1 through stage 4 chronic kidney disease, or unspecified chronic kidney disease: Secondary | ICD-10-CM | POA: Diagnosis not present

## 2022-12-18 DIAGNOSIS — D649 Anemia, unspecified: Secondary | ICD-10-CM

## 2022-12-18 DIAGNOSIS — Z952 Presence of prosthetic heart valve: Secondary | ICD-10-CM | POA: Diagnosis not present

## 2022-12-18 DIAGNOSIS — I2510 Atherosclerotic heart disease of native coronary artery without angina pectoris: Secondary | ICD-10-CM | POA: Diagnosis not present

## 2022-12-18 DIAGNOSIS — N189 Chronic kidney disease, unspecified: Secondary | ICD-10-CM | POA: Diagnosis not present

## 2022-12-18 DIAGNOSIS — R7303 Prediabetes: Secondary | ICD-10-CM | POA: Diagnosis not present

## 2022-12-18 DIAGNOSIS — Z87891 Personal history of nicotine dependence: Secondary | ICD-10-CM | POA: Diagnosis not present

## 2022-12-18 DIAGNOSIS — M1611 Unilateral primary osteoarthritis, right hip: Secondary | ICD-10-CM | POA: Diagnosis not present

## 2022-12-18 DIAGNOSIS — Z01818 Encounter for other preprocedural examination: Secondary | ICD-10-CM

## 2022-12-18 DIAGNOSIS — Z853 Personal history of malignant neoplasm of breast: Secondary | ICD-10-CM | POA: Diagnosis not present

## 2022-12-18 DIAGNOSIS — Z01812 Encounter for preprocedural laboratory examination: Secondary | ICD-10-CM | POA: Insufficient documentation

## 2022-12-18 HISTORY — DX: Anemia, unspecified: D64.9

## 2022-12-18 HISTORY — DX: Unspecified macular degeneration: H35.30

## 2022-12-18 LAB — BASIC METABOLIC PANEL
Anion gap: 9 (ref 5–15)
BUN: 31 mg/dL — ABNORMAL HIGH (ref 8–23)
CO2: 24 mmol/L (ref 22–32)
Calcium: 9.8 mg/dL (ref 8.9–10.3)
Chloride: 102 mmol/L (ref 98–111)
Creatinine, Ser: 0.89 mg/dL (ref 0.44–1.00)
GFR, Estimated: >60 mL/min (ref >60)
Glucose, Bld: 105 mg/dL — ABNORMAL HIGH (ref 70–99)
Potassium: 5 mmol/L (ref 3.5–5.1)
Sodium: 135 mmol/L (ref 135–145)

## 2022-12-18 LAB — CBC
HCT: 36.9 % (ref 36.0–46.0)
Hemoglobin: 11.9 g/dL — ABNORMAL LOW (ref 12.0–15.0)
MCH: 30.7 pg (ref 26.0–34.0)
MCHC: 32.2 g/dL (ref 30.0–36.0)
MCV: 95.3 fL (ref 80.0–100.0)
Platelets: 219 10*3/uL (ref 150–400)
RBC: 3.87 MIL/uL (ref 3.87–5.11)
RDW: 14.9 % (ref 11.5–15.5)
WBC: 7.5 10*3/uL (ref 4.0–10.5)
nRBC: 0 % (ref 0.0–0.2)

## 2022-12-18 LAB — SURGICAL PCR SCREEN
MRSA, PCR: NEGATIVE
Staphylococcus aureus: NEGATIVE

## 2022-12-18 NOTE — Progress Notes (Addendum)
COVID Vaccine Completed:  Yes  Date of COVID positive in last 90 days:  No  PCP - Seward Carol, MD Cardiologist - Candee Furbish, MD  Chest x-ray - 1V 11-05-22, 2V 10-06-22 Epic EKG - 11-05-22 Epic Stress Test - Several years ago ECHO - 11-15-22 Epic Cardiac Cath - 08-17-22 Epic Pacemaker/ICD device last checked: Spinal Cord Stimulator: Coronary CT - 08-30-22  Bowel Prep - N/A  Sleep Study - No CPAP -   Fasting Blood Sugar - N/A Checks Blood Sugar _____ times a day  Last dose of GLP1 agonist-  N/A GLP1 instructions:  N/A   Last dose of SGLT-2 inhibitors-  N/A SGLT-2 instructions: N/A  Blood Thinner Instructions: Aspirin Instructions:  ASA 81.  Patient to stay on due to recent heart surgery Last Dose:  Activity level:  Unable to climb stairs due to joint pain.  Able to perform activities of daily living without stopping and without symptoms of chest pain or shortness of breath.  Anesthesia review:  CAD, severe aortic stenosis, HTN, CKD.  S/p TAVR, hx of MI  Patient denies shortness of breath, fever, cough and chest pain at PAT appointment  Patient verbalized understanding of instructions that were given to them at the PAT appointment. Patient was also instructed that they will need to review over the PAT instructions again at home before surgery.

## 2022-12-19 LAB — HEMOGLOBIN A1C
Hgb A1c MFr Bld: 6.1 % — ABNORMAL HIGH (ref 4.8–5.6)
Mean Plasma Glucose: 128 mg/dL

## 2022-12-19 NOTE — Progress Notes (Addendum)
Anesthesia Chart Review   Case: [id number] Date/Time: 12/27/22 1330   Procedure: TOTAL HIP ARTHROPLASTY ANTERIOR APPROACH (Right: Hip)   Anesthesia type: Choice   Pre-op diagnosis: Right hip osteoarthritis   [facility] ROOM 10 / [facility]   Surgeons: Gaynelle Arabian, MD      Addendum 12/26/22:  Pt has clearance for surgery from PCP - see paper chart.   Pt also has clearance from her GI. Dr Gaspar Garbe notes:  - pt with hx of anemia and fecal occult positive stool. EGD 11/06/22 showed stomach and duodenum inflammation. PPI therapy recommended. Colonoscopy unsuccessful, not planned in the future. CT abd/pelvis 08/2022 showed sigmoid diverticulosis. Hgb stable. Ok to proceed with planned procedure without further GI work up. Monitor CBC in perioperative period and avoid NSAIDS.   Willeen Cass, PhD, FNP-BC [facility] Short Stay Surgical Center/Anesthesiology Phone: [phone] 12/26/2022 9:13 AM    DISCUSSION:89 y.o. former smoker with h/o HTN, CAD, s/p TAVR, CKD, breast cancer s/p right mastectomy, right hip OA scheduled for above procedure 12/27/2022 with Dr. Gaynelle Arabian.   Per cardiology preoperative evaluation 11/29/2022, "Chart reviewed as part of pre-operative protocol coverage. Patient is scheduled to have a right total hip arthroplasty on 12/27/2022. She was recently seen by Dr. Marlou Porch on 10/24/2022 at which time Dr. Marlou Porch commented on plans for hip surgery. Per his note, "Planning on hip surgery in January. This seems reasonable. She is able to proceed from a cardiac perspective." Following this visit, it does look like she was admitted for acute anemia due to a GI bleed. Hemoglobin has since stabilized but would also recommend reaching out to PCP for medical clearance prior to surgery. "   VS: BP (!) 153/57   Pulse 65   Temp 36.8 C (Oral)   Resp 12   Ht '5\' 2"'$  (1.575 m)   Wt 57.2 kg   LMP  (LMP Unknown)   SpO2 99%   BMI 23.05 kg/m   PROVIDERS: Seward Carol, MD is PCP    Cardiologist - Candee Furbish, MD  LABS: Labs reviewed: Acceptable for surgery. (all labs ordered are listed, but only abnormal results are displayed)  Labs Reviewed  HEMOGLOBIN A1C - Abnormal; Notable for the following components:      Result Value   Hgb A1c MFr Bld 6.1 (*)    All other components within normal limits  BASIC METABOLIC PANEL - Abnormal; Notable for the following components:   Glucose, Bld 105 (*)    BUN 31 (*)    All other components within normal limits  CBC - Abnormal; Notable for the following components:   Hemoglobin 11.9 (*)    All other components within normal limits  SURGICAL PCR SCREEN     IMAGES:   EKG:   CV: Echo 11/15/2022 1. 23 mm S3. Vmax 2.0 m/s, 9.0 mmHG, EOA 1.84 cm2, DI 0.53. No  regurgitation or paravalvular leak. The aortic valve has been  repaired/replaced. Aortic valve regurgitation is not visualized. There is  a 23 mm Sapien prosthetic (TAVR) valve present in the  aortic position. Procedure Date: 10/10/2022. Echo findings are consistent  with normal structure and function of the aortic valve prosthesis.   2. Left ventricular ejection fraction, by estimation, is 65 to 70%. The  left ventricle has normal function. The left ventricle has no regional  wall motion abnormalities. Left ventricular diastolic parameters are  consistent with Grade I diastolic  dysfunction (impaired relaxation).   3. Right ventricular systolic function is normal. The right  ventricular  size is normal. Tricuspid regurgitation signal is inadequate for assessing  PA pressure.   4. The mitral valve is grossly normal. Mild mitral valve regurgitation.  No evidence of mitral stenosis.   5. The inferior vena cava is normal in size with greater than 50%  respiratory variability, suggesting right atrial pressure of 3 mmHg.   Myocardial Perfusion 04/05/2020 Nuclear stress EF: 79%. No wall motion abnormalities detected There was no ST segment deviation noted during  stress. This is a low risk study. There is no ischemia identified The study is normal. Past Medical History:  Diagnosis Date   Anemia    Breast cancer ([place])    Hx of BC, s/p mastectomy on right 1999, s/p tamoxifen X1 year   Chest pain    EC Stress test 12/21/11 Low risk, no ischemia, normal EF   Chronic kidney disease    Coronary artery disease    Remote Hx of CAD, normal stress test 2009   Diverticulitis    GERD (gastroesophageal reflux disease)    No longer a problem   Hypercholesteremia    Hypertension    Insomnia    Lichen sclerosus    Low back pain    Macular degeneration    Myocardial infarction ([place]) 12/11/1998   Osteoarthrosis, unspecified whether generalized or localized, unspecified site    Pain    Several year Hx of pain/ SOB radiating to her neck   S/P TAVR (transcatheter aortic valve replacement) 10/10/2022   s/p TAVR with a 62m Edwards S3UR via the TF approach by Dr. MAngelena Form& Dr. ETenny Craw  Severe aortic stenosis    UTI (urinary tract infection)     Past Surgical History:  Procedure Laterality Date   ABDOMINAL HYSTERECTOMY     APPENDECTOMY     BIOPSY  11/06/2022   Procedure: BIOPSY;  Surgeon: VLoney Laurence DO;  [facility];  Service: Gastroenterology;;   BREAST BIOPSY Left 11/14/2022   [place] BREAST BX W LOC DEV 1ST LESION IMG BX [place] 11/14/2022 [facility] MAMMOGRAPHY   ESOPHAGOGASTRODUODENOSCOPY N/A 11/06/2022   Procedure: ESOPHAGOGASTRODUODENOSCOPY (EGD);  Surgeon: VLoney Laurence DO;  [facility];  Service: Gastroenterology;  Laterality: N/A;   HAND SURGERY Bilateral    CTS and trigger finger on right and left   INTRAOPERATIVE TRANSTHORACIC ECHOCARDIOGRAM N/A 10/10/2022   Procedure: INTRAOPERATIVE TRANSTHORACIC ECHOCARDIOGRAM;  Surgeon: MBurnell Blanks MD;  [place]  Service: Open Heart Surgery;  Laterality: N/A;   MASTECTOMY Right 12/11/1997   REPLACEMENT TOTAL KNEE Right 11/10/2009   RIGHT HEART CATH  AND CORONARY ANGIOGRAPHY N/A 08/17/2022   Procedure: RIGHT HEART CATH AND CORONARY ANGIOGRAPHY;  Surgeon: MBurnell Blanks MD;  [facility];  Service: Cardiovascular;  Laterality: N/A;   TOTAL KNEE REVISION Right 05/17/2022   Procedure: Right knee polyethylene revision;  Surgeon: AGaynelle Arabian MD;  [facility];  Service: Orthopedics;  Laterality: Right;   TRANSCATHETER AORTIC VALVE REPLACEMENT, TRANSFEMORAL N/A 10/10/2022   Procedure: Transcatheter Aortic Valve Replacement, Transfemoral;  Surgeon: MBurnell Blanks MD;  [place] [facility]  Service: Open Heart Surgery;  Laterality: N/A;   ULTRASOUND GUIDANCE FOR VASCULAR ACCESS  10/10/2022   Procedure: ULTRASOUND GUIDANCE FOR VASCULAR ACCESS;  Surgeon: MBurnell Blanks MD;  [place]  Service: Open Heart Surgery;;   VESICOVAGINAL FISTULA CLOSURE W/ TAH  12/12/1967    MEDICATIONS:  acetaminophen (TYLENOL) 325 MG tablet   aspirin EC 81 MG tablet  b complex vitamins capsule   Camphor-Menthol-Methyl Sal (SALONPAS) 3.12-16-08 % PTCH   cetirizine (ZYRTEC) 10 MG tablet   cholecalciferol (VITAMIN D3) 25 MCG (1000 UNIT) tablet   CRANBERRY PO   furosemide (LASIX) 20 MG tablet   furosemide (LASIX) 40 MG tablet   gabapentin (NEURONTIN) 300 MG capsule   isosorbide mononitrate (IMDUR) 30 MG 24 hr tablet   losartan (COZAAR) 50 MG tablet   Multiple Vitamins-Minerals (PRESERVISION AREDS 2) CAPS   nitroGLYCERIN (NITROSTAT) 0.4 MG SL tablet   ondansetron (ZOFRAN) 4 MG tablet   ONETOUCH ULTRA test strip   pantoprazole (PROTONIX) 40 MG tablet   Polyethyl Glycol-Propyl Glycol (SYSTANE OP)   saccharomyces boulardii (FLORASTOR) 250 MG capsule   spironolactone (ALDACTONE) 25 MG tablet   zolpidem (AMBIEN) 5 MG tablet   No current facility-administered medications for this encounter.     Konrad Felix Ward, PA-C [facility] Pre-Surgical Testing [phone]

## 2022-12-22 ENCOUNTER — Ambulatory Visit (HOSPITAL_COMMUNITY): Payer: Medicare Other

## 2022-12-26 NOTE — Anesthesia Preprocedure Evaluation (Addendum)
Anesthesia Evaluation  Patient identified by MRN, date of birth, ID band Patient awake    Reviewed: Allergy & Precautions, H&P , NPO status , Patient's Chart, lab work & pertinent test results  Airway Mallampati: II  TM Distance: >3 FB Neck ROM: Full    Dental no notable dental hx.    Pulmonary neg pulmonary ROS, former smoker   Pulmonary exam normal breath sounds clear to auscultation       Cardiovascular hypertension, Pt. on medications + CAD and + Past MI  Normal cardiovascular exam Rhythm:Regular Rate:Normal     Neuro/Psych negative neurological ROS  negative psych ROS   GI/Hepatic Neg liver ROS,GERD  ,,  Endo/Other  diabetes    Renal/GU Renal InsufficiencyRenal disease  negative genitourinary   Musculoskeletal  (+) Arthritis , Osteoarthritis,    Abdominal   Peds negative pediatric ROS (+)  Hematology  (+) Blood dyscrasia, anemia   Anesthesia Other Findings   Reproductive/Obstetrics negative OB ROS                             Anesthesia Physical Anesthesia Plan  ASA: 3  Anesthesia Plan: Spinal   Post-op Pain Management: Minimal or no pain anticipated   Induction: Intravenous  PONV Risk Score and Plan: 2 and Ondansetron, Midazolam and Treatment may vary due to age or medical condition  Airway Management Planned: Simple Face Mask  Additional Equipment:   Intra-op Plan:   Post-operative Plan:   Informed Consent: I have reviewed the patients History and Physical, chart, labs and discussed the procedure including the risks, benefits and alternatives for the proposed anesthesia with the patient or authorized representative who has indicated his/her understanding and acceptance.     Dental advisory given  Plan Discussed with: CRNA  Anesthesia Plan Comments: (See APP note by Durel Salts, FNP and J. Ward, PA)       Anesthesia Quick Evaluation

## 2022-12-27 ENCOUNTER — Other Ambulatory Visit: Payer: Self-pay

## 2022-12-27 ENCOUNTER — Encounter (HOSPITAL_COMMUNITY): Payer: Self-pay | Admitting: Orthopedic Surgery

## 2022-12-27 ENCOUNTER — Encounter (HOSPITAL_COMMUNITY)
Admission: RE | Disposition: A | Discharge status: Home Health | Payer: Self-pay | Source: Ambulatory Visit | Attending: Orthopedic Surgery

## 2022-12-27 ENCOUNTER — Observation Stay (HOSPITAL_COMMUNITY)
Admission: RE | Admit: 2022-12-27 | Discharge: 2022-12-29 | Disposition: A | Discharge status: Home Health | Payer: Medicare Other | Source: Ambulatory Visit | Attending: Orthopedic Surgery | Admitting: Orthopedic Surgery

## 2022-12-27 ENCOUNTER — Ambulatory Visit (HOSPITAL_COMMUNITY): Payer: Medicare Other

## 2022-12-27 ENCOUNTER — Ambulatory Visit (HOSPITAL_COMMUNITY): Payer: Medicare Other | Admitting: Physician Assistant

## 2022-12-27 ENCOUNTER — Observation Stay (HOSPITAL_COMMUNITY): Payer: Medicare Other

## 2022-12-27 ENCOUNTER — Ambulatory Visit (HOSPITAL_BASED_OUTPATIENT_CLINIC_OR_DEPARTMENT_OTHER): Payer: Medicare Other | Admitting: Anesthesiology

## 2022-12-27 DIAGNOSIS — Z87891 Personal history of nicotine dependence: Secondary | ICD-10-CM

## 2022-12-27 DIAGNOSIS — I129 Hypertensive chronic kidney disease with stage 1 through stage 4 chronic kidney disease, or unspecified chronic kidney disease: Secondary | ICD-10-CM | POA: Insufficient documentation

## 2022-12-27 DIAGNOSIS — I2510 Atherosclerotic heart disease of native coronary artery without angina pectoris: Secondary | ICD-10-CM | POA: Insufficient documentation

## 2022-12-27 DIAGNOSIS — Z471 Aftercare following joint replacement surgery: Secondary | ICD-10-CM | POA: Diagnosis not present

## 2022-12-27 DIAGNOSIS — Z79899 Other long term (current) drug therapy: Secondary | ICD-10-CM | POA: Diagnosis not present

## 2022-12-27 DIAGNOSIS — M169 Osteoarthritis of hip, unspecified: Secondary | ICD-10-CM | POA: Diagnosis present

## 2022-12-27 DIAGNOSIS — I252 Old myocardial infarction: Secondary | ICD-10-CM

## 2022-12-27 DIAGNOSIS — E1122 Type 2 diabetes mellitus with diabetic chronic kidney disease: Secondary | ICD-10-CM | POA: Diagnosis not present

## 2022-12-27 DIAGNOSIS — Z7982 Long term (current) use of aspirin: Secondary | ICD-10-CM | POA: Diagnosis not present

## 2022-12-27 DIAGNOSIS — Z01818 Encounter for other preprocedural examination: Secondary | ICD-10-CM

## 2022-12-27 DIAGNOSIS — Z96641 Presence of right artificial hip joint: Secondary | ICD-10-CM | POA: Diagnosis not present

## 2022-12-27 DIAGNOSIS — Z853 Personal history of malignant neoplasm of breast: Secondary | ICD-10-CM | POA: Insufficient documentation

## 2022-12-27 DIAGNOSIS — I10 Essential (primary) hypertension: Secondary | ICD-10-CM

## 2022-12-27 DIAGNOSIS — M1611 Unilateral primary osteoarthritis, right hip: Principal | ICD-10-CM | POA: Diagnosis present

## 2022-12-27 DIAGNOSIS — N1831 Chronic kidney disease, stage 3a: Secondary | ICD-10-CM | POA: Diagnosis not present

## 2022-12-27 DIAGNOSIS — Z96651 Presence of right artificial knee joint: Secondary | ICD-10-CM | POA: Diagnosis not present

## 2022-12-27 HISTORY — PX: TOTAL HIP ARTHROPLASTY: SHX124

## 2022-12-27 LAB — TYPE AND SCREEN
ABO/RH(D): O POS
Antibody Screen: NEGATIVE

## 2022-12-27 SURGERY — ARTHROPLASTY, HIP, TOTAL, ANTERIOR APPROACH
Anesthesia: Spinal | Site: Hip | Laterality: Right

## 2022-12-27 MED ORDER — METOCLOPRAMIDE HCL 5 MG PO TABS: 5.0000 mg | ORAL_TABLET | Freq: Three times a day (TID) | ORAL | Status: DC | PRN

## 2022-12-27 MED ORDER — OXYCODONE HCL 5 MG/5ML PO SOLN: 5.0000 mg | Freq: Once | ORAL | Status: DC | PRN

## 2022-12-27 MED ORDER — 0.9 % SODIUM CHLORIDE (POUR BTL) OPTIME
TOPICAL | Status: DC | PRN
  Administered 2022-12-27: 1000 mL

## 2022-12-27 MED ORDER — ACETAMINOPHEN 325 MG PO TABS
325.0000 mg | ORAL_TABLET | Freq: Four times a day (QID) | ORAL | Status: DC | PRN
  Administered 2022-12-28 – 2022-12-29 (×2): 650 mg via ORAL
  Filled 2022-12-27: qty 2
  Filled 2022-12-27: qty 1
  Filled 2022-12-27 (×2): qty 2

## 2022-12-27 MED ORDER — METHOCARBAMOL 500 MG IVPB - SIMPLE MED
INTRAVENOUS | Status: AC
  Filled 2022-12-27: qty 55

## 2022-12-27 MED ORDER — DEXAMETHASONE SODIUM PHOSPHATE 10 MG/ML IJ SOLN: 8.0000 mg | Freq: Once | INTRAMUSCULAR | Status: DC

## 2022-12-27 MED ORDER — HYDROMORPHONE HCL 1 MG/ML IJ SOLN: 0.5000 mg | INTRAMUSCULAR | Status: DC | PRN

## 2022-12-27 MED ORDER — BUPIVACAINE LIPOSOME 1.3 % IJ SUSP
INTRAMUSCULAR | Status: AC
  Filled 2022-12-27: qty 20

## 2022-12-27 MED ORDER — POLYETHYLENE GLYCOL 3350 17 G PO PACK: 17.0000 g | PACK | Freq: Every day | ORAL | Status: DC | PRN

## 2022-12-27 MED ORDER — LOSARTAN POTASSIUM 50 MG PO TABS: 50.0000 mg | ORAL_TABLET | ORAL | Status: DC

## 2022-12-27 MED ORDER — LORATADINE 10 MG PO TABS
10.0000 mg | ORAL_TABLET | Freq: Every day | ORAL | Status: DC
  Administered 2022-12-28 – 2022-12-29 (×2): 10 mg via ORAL
  Filled 2022-12-27 (×2): qty 1

## 2022-12-27 MED ORDER — LACTATED RINGERS IV SOLN: INTRAVENOUS | Status: DC

## 2022-12-27 MED ORDER — FENTANYL CITRATE (PF) 100 MCG/2ML IJ SOLN
INTRAMUSCULAR | Status: AC
  Filled 2022-12-27: qty 2

## 2022-12-27 MED ORDER — HYDROMORPHONE HCL 1 MG/ML IJ SOLN
INTRAMUSCULAR | Status: AC
  Filled 2022-12-27: qty 1

## 2022-12-27 MED ORDER — SODIUM CHLORIDE (PF) 0.9 % IJ SOLN
INTRAMUSCULAR | Status: AC
  Filled 2022-12-27: qty 10

## 2022-12-27 MED ORDER — EPHEDRINE SULFATE-NACL 50-0.9 MG/10ML-% IV SOSY
PREFILLED_SYRINGE | INTRAVENOUS | Status: DC | PRN
  Administered 2022-12-27: 10 mg via INTRAVENOUS

## 2022-12-27 MED ORDER — PHENYLEPHRINE HCL (PRESSORS) 10 MG/ML IV SOLN
INTRAVENOUS | Status: DC | PRN
  Administered 2022-12-27 (×3): 160 ug via INTRAVENOUS

## 2022-12-27 MED ORDER — ZOLPIDEM TARTRATE 5 MG PO TABS
5.0000 mg | ORAL_TABLET | Freq: Every day | ORAL | Status: DC
  Administered 2022-12-27 – 2022-12-28 (×2): 5 mg via ORAL
  Filled 2022-12-27 (×2): qty 1

## 2022-12-27 MED ORDER — ONDANSETRON HCL 4 MG/2ML IJ SOLN: 4.0000 mg | Freq: Four times a day (QID) | INTRAMUSCULAR | Status: DC | PRN

## 2022-12-27 MED ORDER — BUPIVACAINE HCL (PF) 0.25 % IJ SOLN
INTRAMUSCULAR | Status: DC | PRN
  Administered 2022-12-27: 30 mL

## 2022-12-27 MED ORDER — METHOCARBAMOL 500 MG PO TABS
500.0000 mg | ORAL_TABLET | Freq: Four times a day (QID) | ORAL | Status: DC | PRN
  Administered 2022-12-27: 500 mg via ORAL
  Filled 2022-12-27: qty 1

## 2022-12-27 MED ORDER — MENTHOL 3 MG MT LOZG: 1.0000 | LOZENGE | OROMUCOSAL | Status: DC | PRN

## 2022-12-27 MED ORDER — METOCLOPRAMIDE HCL 5 MG/ML IJ SOLN: 5.0000 mg | Freq: Three times a day (TID) | INTRAMUSCULAR | Status: DC | PRN

## 2022-12-27 MED ORDER — GABAPENTIN 300 MG PO CAPS
600.0000 mg | ORAL_CAPSULE | Freq: Every day | ORAL | Status: DC
  Administered 2022-12-27 – 2022-12-28 (×2): 600 mg via ORAL
  Filled 2022-12-27 (×2): qty 2

## 2022-12-27 MED ORDER — SODIUM CHLORIDE 0.9 % IV SOLN: INTRAVENOUS | Status: DC

## 2022-12-27 MED ORDER — ISOSORBIDE MONONITRATE ER 30 MG PO TB24
30.0000 mg | ORAL_TABLET | Freq: Every day | ORAL | Status: DC
  Administered 2022-12-29: 30 mg via ORAL
  Filled 2022-12-27 (×2): qty 1

## 2022-12-27 MED ORDER — BUPIVACAINE HCL (PF) 0.75 % IJ SOLN
INTRAMUSCULAR | Status: DC | PRN
  Administered 2022-12-27: 1.8 mL via INTRATHECAL

## 2022-12-27 MED ORDER — ACETAMINOPHEN 10 MG/ML IV SOLN
1000.0000 mg | Freq: Once | INTRAVENOUS | Status: AC
  Administered 2022-12-27: 1000 mg via INTRAVENOUS
  Filled 2022-12-27: qty 100

## 2022-12-27 MED ORDER — HYDROMORPHONE HCL 1 MG/ML IJ SOLN
0.2500 mg | INTRAMUSCULAR | Status: DC | PRN
  Administered 2022-12-27 (×2): 0.5 mg via INTRAVENOUS

## 2022-12-27 MED ORDER — CHLORHEXIDINE GLUCONATE 0.12 % MT SOLN
15.0000 mL | Freq: Once | OROMUCOSAL | Status: AC
  Administered 2022-12-27: 15 mL via OROMUCOSAL

## 2022-12-27 MED ORDER — POVIDONE-IODINE 10 % EX SWAB: 2.0000 | Freq: Once | CUTANEOUS | Status: DC

## 2022-12-27 MED ORDER — BUPIVACAINE-EPINEPHRINE (PF) 0.5% -1:200000 IJ SOLN
INTRAMUSCULAR | Status: AC
  Filled 2022-12-27: qty 30

## 2022-12-27 MED ORDER — LOSARTAN POTASSIUM 50 MG PO TABS
50.0000 mg | ORAL_TABLET | Freq: Every day | ORAL | Status: DC
  Administered 2022-12-29: 50 mg via ORAL
  Filled 2022-12-27 (×2): qty 1

## 2022-12-27 MED ORDER — DOCUSATE SODIUM 100 MG PO CAPS
100.0000 mg | ORAL_CAPSULE | Freq: Two times a day (BID) | ORAL | Status: DC
  Administered 2022-12-28: 100 mg via ORAL
  Filled 2022-12-27 (×4): qty 1

## 2022-12-27 MED ORDER — PROSIGHT PO TABS
1.0000 | ORAL_TABLET | Freq: Two times a day (BID) | ORAL | Status: DC
  Administered 2022-12-27 – 2022-12-29 (×4): 1 via ORAL
  Filled 2022-12-27 (×4): qty 1

## 2022-12-27 MED ORDER — HYDROCODONE-ACETAMINOPHEN 7.5-325 MG PO TABS
1.0000 | ORAL_TABLET | ORAL | Status: DC | PRN
  Administered 2022-12-28: 1 via ORAL
  Filled 2022-12-27: qty 1

## 2022-12-27 MED ORDER — WATER FOR IRRIGATION, STERILE IR SOLN
Status: DC | PRN
  Administered 2022-12-27: 2000 mL

## 2022-12-27 MED ORDER — ONDANSETRON HCL 4 MG/2ML IJ SOLN
INTRAMUSCULAR | Status: DC | PRN
  Administered 2022-12-27: 4 mg via INTRAVENOUS

## 2022-12-27 MED ORDER — HYDROCODONE-ACETAMINOPHEN 5-325 MG PO TABS
1.0000 | ORAL_TABLET | ORAL | Status: DC | PRN
  Administered 2022-12-27 – 2022-12-29 (×5): 1 via ORAL
  Filled 2022-12-27 (×6): qty 1

## 2022-12-27 MED ORDER — PHENOL 1.4 % MT LIQD: 1.0000 | OROMUCOSAL | Status: DC | PRN

## 2022-12-27 MED ORDER — BUPIVACAINE HCL (PF) 0.25 % IJ SOLN
INTRAMUSCULAR | Status: AC
  Filled 2022-12-27: qty 30

## 2022-12-27 MED ORDER — ORAL CARE MOUTH RINSE: 15.0000 mL | Freq: Once | OROMUCOSAL | Status: AC

## 2022-12-27 MED ORDER — OXYCODONE HCL 5 MG PO TABS: 5.0000 mg | ORAL_TABLET | Freq: Once | ORAL | Status: DC | PRN

## 2022-12-27 MED ORDER — DEXAMETHASONE SODIUM PHOSPHATE 10 MG/ML IJ SOLN
INTRAMUSCULAR | Status: DC | PRN
  Administered 2022-12-27: 10 mg via INTRAVENOUS

## 2022-12-27 MED ORDER — FENTANYL CITRATE (PF) 100 MCG/2ML IJ SOLN
INTRAMUSCULAR | Status: DC | PRN
  Administered 2022-12-27 (×2): 50 ug via INTRAVENOUS

## 2022-12-27 MED ORDER — SPIRONOLACTONE 25 MG PO TABS
25.0000 mg | ORAL_TABLET | Freq: Every day | ORAL | Status: DC
  Administered 2022-12-28 – 2022-12-29 (×2): 25 mg via ORAL
  Filled 2022-12-27 (×2): qty 1

## 2022-12-27 MED ORDER — PROMETHAZINE HCL 25 MG/ML IJ SOLN: 6.2500 mg | INTRAMUSCULAR | Status: DC | PRN

## 2022-12-27 MED ORDER — CEFAZOLIN SODIUM-DEXTROSE 2-4 GM/100ML-% IV SOLN
2.0000 g | INTRAVENOUS | Status: AC
  Administered 2022-12-27: 2 g via INTRAVENOUS
  Filled 2022-12-27: qty 100

## 2022-12-27 MED ORDER — FLEET ENEMA 7-19 GM/118ML RE ENEM: 1.0000 | ENEMA | Freq: Once | RECTAL | Status: DC | PRN

## 2022-12-27 MED ORDER — METHOCARBAMOL 500 MG IVPB - SIMPLE MED
500.0000 mg | Freq: Four times a day (QID) | INTRAVENOUS | Status: DC | PRN
  Administered 2022-12-27: 500 mg via INTRAVENOUS

## 2022-12-27 MED ORDER — TRANEXAMIC ACID-NACL 1000-0.7 MG/100ML-% IV SOLN
1000.0000 mg | INTRAVENOUS | Status: AC
  Administered 2022-12-27: 1000 mg via INTRAVENOUS
  Filled 2022-12-27: qty 100

## 2022-12-27 MED ORDER — PROPOFOL 500 MG/50ML IV EMUL
INTRAVENOUS | Status: DC | PRN
  Administered 2022-12-27: 75 ug/kg/min via INTRAVENOUS
  Administered 2022-12-27: 140 mg via INTRAVENOUS

## 2022-12-27 MED ORDER — NITROGLYCERIN 0.4 MG SL SUBL: 0.4000 mg | SUBLINGUAL_TABLET | SUBLINGUAL | Status: DC | PRN

## 2022-12-27 MED ORDER — BISACODYL 10 MG RE SUPP: 10.0000 mg | Freq: Every day | RECTAL | Status: DC | PRN

## 2022-12-27 MED ORDER — POLYVINYL ALCOHOL 1.4 % OP SOLN
1.0000 [drp] | Freq: Two times a day (BID) | OPHTHALMIC | Status: DC
  Filled 2022-12-27: qty 15

## 2022-12-27 MED ORDER — ONDANSETRON HCL 4 MG PO TABS: 4.0000 mg | ORAL_TABLET | Freq: Four times a day (QID) | ORAL | Status: DC | PRN

## 2022-12-27 MED ORDER — SODIUM CHLORIDE (PF) 0.9 % IJ SOLN
INTRAMUSCULAR | Status: AC
  Filled 2022-12-27: qty 50

## 2022-12-27 MED ORDER — RIVAROXABAN 10 MG PO TABS
10.0000 mg | ORAL_TABLET | Freq: Every day | ORAL | Status: DC
  Administered 2022-12-28 – 2022-12-29 (×2): 10 mg via ORAL
  Filled 2022-12-27 (×2): qty 1

## 2022-12-27 SURGICAL SUPPLY — 41 items
BAG COUNTER SPONGE SURGICOUNT (BAG) IMPLANT
BAG DECANTER FOR FLEXI CONT (MISCELLANEOUS) IMPLANT
BAG ZIPLOCK 12X15 (MISCELLANEOUS) IMPLANT
BLADE SAG 18X100X1.27 (BLADE) ×1 IMPLANT
COVER PERINEAL POST (MISCELLANEOUS) ×1 IMPLANT
COVER SURGICAL LIGHT HANDLE (MISCELLANEOUS) ×1 IMPLANT
CUP ACETBLR 48 OD SECTOR II (Hips) IMPLANT
DRAPE FOOT SWITCH (DRAPES) ×1 IMPLANT
DRAPE STERI IOBAN 125X83 (DRAPES) ×1 IMPLANT
DRAPE U-SHAPE 47X51 STRL (DRAPES) ×2 IMPLANT
DRSG AQUACEL AG ADV 3.5X10 (GAUZE/BANDAGES/DRESSINGS) ×1 IMPLANT
DURAPREP 26ML APPLICATOR (WOUND CARE) ×1 IMPLANT
ELECT REM PT RETURN 15FT ADLT (MISCELLANEOUS) ×1 IMPLANT
GLOVE BIO SURGEON STRL SZ 6.5 (GLOVE) IMPLANT
GLOVE BIO SURGEON STRL SZ7.5 (GLOVE) IMPLANT
GLOVE BIO SURGEON STRL SZ8 (GLOVE) ×1 IMPLANT
GLOVE BIOGEL PI IND STRL 6.5 (GLOVE) IMPLANT
GLOVE BIOGEL PI IND STRL 7.0 (GLOVE) IMPLANT
GLOVE BIOGEL PI IND STRL 8 (GLOVE) ×1 IMPLANT
GOWN STRL REUS W/ TWL LRG LVL3 (GOWN DISPOSABLE) ×1 IMPLANT
GOWN STRL REUS W/ TWL XL LVL3 (GOWN DISPOSABLE) IMPLANT
GOWN STRL REUS W/TWL LRG LVL3 (GOWN DISPOSABLE) ×1
GOWN STRL REUS W/TWL XL LVL3 (GOWN DISPOSABLE)
HEAD FEM STD 28X+1.5 STRL (Hips) IMPLANT
HOLDER FOLEY CATH W/STRAP (MISCELLANEOUS) ×1 IMPLANT
KIT TURNOVER KIT A (KITS) IMPLANT
LINER MARATHON 28 48 (Hips) IMPLANT
MANIFOLD NEPTUNE II (INSTRUMENTS) ×1 IMPLANT
PACK ANTERIOR HIP CUSTOM (KITS) ×1 IMPLANT
PENCIL SMOKE EVACUATOR COATED (MISCELLANEOUS) ×1 IMPLANT
SPIKE FLUID TRANSFER (MISCELLANEOUS) ×1 IMPLANT
STEM FEM ACTIS STD SZ4 (Stem) IMPLANT
STRIP CLOSURE SKIN 1/2X4 (GAUZE/BANDAGES/DRESSINGS) ×1 IMPLANT
SUT ETHIBOND NAB CT1 #1 30IN (SUTURE) ×1 IMPLANT
SUT MNCRL AB 4-0 PS2 18 (SUTURE) ×1 IMPLANT
SUT STRATAFIX 0 PDS 27 VIOLET (SUTURE) ×1
SUT VIC AB 2-0 CT1 27 (SUTURE) ×2
SUT VIC AB 2-0 CT1 TAPERPNT 27 (SUTURE) ×2 IMPLANT
SUTURE STRATFX 0 PDS 27 VIOLET (SUTURE) ×1 IMPLANT
TRAY FOLEY MTR SLVR 16FR STAT (SET/KITS/TRAYS/PACK) ×1 IMPLANT
TUBE SUCTION HIGH CAP CLEAR NV (SUCTIONS) ×1 IMPLANT

## 2022-12-27 NOTE — Plan of Care (Signed)
  Problem: Education: Goal: Knowledge of the prescribed therapeutic regimen will improve Outcome: Progressing   Problem: Activity: Goal: Ability to avoid complications of mobility impairment will improve Outcome: Progressing   Problem: Skin Integrity: Goal: Will show signs of wound healing Outcome: Progressing   Problem: Education: Goal: Knowledge of General Education information will improve Description: Including pain rating scale, medication(s)/side effects and non-pharmacologic comfort measures Outcome: Progressing   Problem: Activity: Goal: Risk for activity intolerance will decrease Outcome: Progressing   Problem: Elimination: Goal: Will not experience complications related to bowel motility Outcome: Progressing

## 2022-12-27 NOTE — Anesthesia Postprocedure Evaluation (Signed)
Anesthesia Post Note  Patient: Barbara Russell  Procedure(s) Performed: TOTAL HIP ARTHROPLASTY ANTERIOR APPROACH (Right: Hip)     Patient location during evaluation: PACU Anesthesia Type: Spinal Level of consciousness: awake and alert Pain management: pain level controlled Vital Signs Assessment: post-procedure vital signs reviewed and stable Respiratory status: spontaneous breathing, nonlabored ventilation and respiratory function stable Cardiovascular status: blood pressure returned to baseline and stable Postop Assessment: no apparent nausea or vomiting Anesthetic complications: no   No notable events documented.  Last Vitals:  Vitals:   12/27/22 1715 12/27/22 1721  BP: 92/79 (!) 123/56  Pulse:  74  Resp: 11 11  Temp:    SpO2: 93% 97%    Last Pain:  Vitals:   12/27/22 1721  TempSrc:   PainSc: [place]

## 2022-12-27 NOTE — Discharge Instructions (Addendum)
Gaynelle Arabian, MD Total Joint Specialist [facility] [place] [address] [place], [place] [address] [phone]  ANTERIOR APPROACH TOTAL HIP REPLACEMENT POSTOPERATIVE DIRECTIONS     Hip Rehabilitation, Guidelines Following Surgery  The results of a hip operation are greatly improved after range of motion and muscle strengthening exercises. Follow all safety measures which are given to protect your hip. If any of these exercises cause increased pain or swelling in your joint, decrease the amount until you are comfortable again. Then slowly increase the exercises. Call your caregiver if you have problems or questions.   BLOOD CLOT PREVENTION Take a 10 mg Xarelto once a day for three weeks following surgery. Then take an 81 mg Aspirin once a day for three weeks. Then discontinue Aspirin. You may resume your vitamins/supplements once you have discontinued the Xarelto. Do not take any NSAIDs (Advil, Aleve, Ibuprofen, Meloxicam, etc.) until you have discontinued the Xarelto.   Information on my medicine - XARELTO (Rivaroxaban)  Why was Xarelto prescribed for you? Xarelto was prescribed for you to reduce the risk of blood clots forming after orthopedic surgery. The medical term for these abnormal blood clots is venous thromboembolism (VTE).  What do you need to know about xarelto ? Take your Xarelto ONCE DAILY at the same time every day. You may take it either with or without food.  If you have difficulty swallowing the tablet whole, you may crush it and mix in applesauce just prior to taking your dose.  Take Xarelto exactly as prescribed by your doctor and DO NOT stop taking Xarelto without talking to the doctor who prescribed the medication.  Stopping without other VTE prevention medication to take the place of Xarelto may increase your risk of developing a clot.  After discharge, you should have regular check-up appointments with your healthcare provider  that is prescribing your Xarelto.    What do you do if you miss a dose? If you miss a dose, take it as soon as you remember on the same day then continue your regularly scheduled once daily regimen the next day. Do not take two doses of Xarelto on the same day.   Important Safety Information A possible side effect of Xarelto is bleeding. You should call your healthcare provider right away if you experience any of the following: Bleeding from an injury or your nose that does not stop. Unusual colored urine (red or dark brown) or unusual colored stools (red or black). Unusual bruising for unknown reasons. A serious fall or if you hit your head (even if there is no bleeding).  Some medicines may interact with Xarelto and might increase your risk of bleeding while on Xarelto. To help avoid this, consult your healthcare provider or pharmacist prior to using any new prescription or non-prescription medications, including herbals, vitamins, non-steroidal anti-inflammatory drugs (NSAIDs) and supplements.  This website has more information on Xarelto: [email or web address]-benson.com/.    HOME CARE INSTRUCTIONS  Remove items at home which could result in a fall. This includes throw rugs or furniture in walking pathways.  ICE to the affected hip as frequently as 20-30 minutes an hour and then as needed for pain and swelling. Continue to use ice on the hip for pain and swelling from surgery. You may notice swelling that will progress down to the foot and ankle. This is normal after surgery. Elevate the leg when you are not up walking on it.   Continue to use the breathing machine which will help keep your  temperature down.  It is common for your temperature to cycle up and down following surgery, especially at night when you are not up moving around and exerting yourself.  The breathing machine keeps your lungs expanded and your temperature down.  DIET You may resume your previous home diet once your are discharged  from the hospital.  DRESSING / WOUND CARE / SHOWERING You have an adhesive waterproof bandage over the incision. Leave this in place until your first follow-up appointment. Once you remove this you will not need to place another bandage.  You may begin showering 3 days following surgery, but do not submerge the incision under water.  ACTIVITY For the first 3-5 days, it is important to rest and keep the operative leg elevated. You should, as a general rule, rest for 50 minutes and walk/stretch for 10 minutes per hour. After 5 days, you may slowly increase activity as tolerated.  Perform the exercises you were provided twice a day for about 15-20 minutes each session. Begin these 2 days following surgery. Walk with your walker as instructed. Use the walker until you are comfortable transitioning to a cane. Walk with the cane in the opposite hand of the operative leg. You may discontinue the cane once you are comfortable and walking steadily. Avoid periods of inactivity such as sitting longer than an hour when not asleep. This helps prevent blood clots.  Do not drive a car for 6 weeks or until released by your surgeon.  Do not drive while taking narcotics.  TED HOSE STOCKINGS Wear the elastic stockings on both legs for three weeks following surgery during the day. You may remove them at night while sleeping.  WEIGHT BEARING Weight bearing as tolerated with assist device (walker, cane, etc) as directed, use it as long as suggested by your surgeon or therapist, typically at least 4-6 weeks.  POSTOPERATIVE CONSTIPATION PROTOCOL Constipation - defined medically as fewer than three stools per week and severe constipation as less than one stool per week.  One of the most common issues patients have following surgery is constipation.  Even if you have a regular bowel pattern at home, your normal regimen is likely to be disrupted due to multiple reasons following surgery.  Combination of anesthesia,  postoperative narcotics, change in appetite and fluid intake all can affect your bowels.  In order to avoid complications following surgery, here are some recommendations in order to help you during your recovery period.  Colace (docusate) - Pick up an over-the-counter form of Colace or another stool softener and take twice a day as long as you are requiring postoperative pain medications.  Take with a full glass of water daily.  If you experience loose stools or diarrhea, hold the colace until you stool forms back up.  If your symptoms do not get better within 1 week or if they get worse, check with your doctor. Dulcolax (bisacodyl) - Pick up over-the-counter and take as directed by the product packaging as needed to assist with the movement of your bowels.  Take with a full glass of water.  Use this product as needed if not relieved by Colace only.  MiraLax (polyethylene glycol) - Pick up over-the-counter to have on hand.  MiraLax is a solution that will increase the amount of water in your bowels to assist with bowel movements.  Take as directed and can mix with a glass of water, juice, soda, coffee, or tea.  Take if you go more than two days without a  movement.Do not use MiraLax more than once per day. Call your doctor if you are still constipated or irregular after using this medication for 7 days in a row.  If you continue to have problems with postoperative constipation, please contact the office for further assistance and recommendations.  If you experience "the worst abdominal pain ever" or develop nausea or vomiting, please contact the office immediatly for further recommendations for treatment.  ITCHING  If you experience itching with your medications, try taking only a single pain pill, or even half a pain pill at a time.  You can also use Benadryl over the counter for itching or also to help with sleep.   MEDICATIONS See your medication summary on the "After Visit Summary" that the nursing  staff will review with you prior to discharge.  You may have some home medications which will be placed on hold until you complete the course of blood thinner medication.  It is important for you to complete the blood thinner medication as prescribed by your surgeon.  Continue your approved medications as instructed at time of discharge.  PRECAUTIONS If you experience chest pain or shortness of breath - call 911 immediately for transfer to the hospital emergency department.  If you develop a fever greater that 101 F, purulent drainage from wound, increased redness or drainage from wound, foul odor from the wound/dressing, or calf pain - CONTACT YOUR SURGEON.                                                   FOLLOW-UP APPOINTMENTS Make sure you keep all of your appointments after your operation with your surgeon and caregivers. You should call the office at the above phone number and make an appointment for approximately two weeks after the date of your surgery or on the date instructed by your surgeon outlined in the "After Visit Summary".  RANGE OF MOTION AND STRENGTHENING EXERCISES  These exercises are designed to help you keep full movement of your hip joint. Follow your caregiver's or physical therapist's instructions. Perform all exercises about fifteen times, three times per day or as directed. Exercise both hips, even if you have had only one joint replacement. These exercises can be done on a training (exercise) mat, on the floor, on a table or on a bed. Use whatever works the best and is most comfortable for you. Use music or television while you are exercising so that the exercises are a pleasant break in your day. This will make your life better with the exercises acting as a break in routine you can look forward to.  Lying on your back, slowly slide your foot toward your buttocks, raising your knee up off the floor. Then slowly slide your foot back down until your leg is straight again.   Lying on your back spread your legs as far apart as you can without causing discomfort.  Lying on your side, raise your upper leg and foot straight up from the floor as far as is comfortable. Slowly lower the leg and repeat.  Lying on your back, tighten up the muscle in the front of your thigh (quadriceps muscles). You can do this by keeping your leg straight and trying to raise your heel off the floor. This helps strengthen the largest muscle supporting your knee.  Lying on your back, tighten  up the muscles of your buttocks both with the legs straight and with the knee bent at a comfortable angle while keeping your heel on the floor.   POST-OPERATIVE OPIOID TAPER INSTRUCTIONS: It is important to wean off of your opioid medication as soon as possible. If you do not need pain medication after your surgery it is ok to stop day one. Opioids include: Codeine, Hydrocodone(Norco, Vicodin), Oxycodone(Percocet, oxycontin) and hydromorphone amongst others.  Long term and even short term use of opiods can cause: Increased pain response Dependence Constipation Depression Respiratory depression And more.  Withdrawal symptoms can include Flu like symptoms Nausea, vomiting And more Techniques to manage these symptoms Hydrate well Eat regular healthy meals Stay active Use relaxation techniques(deep breathing, meditating, yoga) Do Not substitute Alcohol to help with tapering If you have been on opioids for less than two weeks and do not have pain than it is ok to stop all together.  Plan to wean off of opioids This plan should start within one week post op of your joint replacement. Maintain the same interval or time between taking each dose and first decrease the dose.  Cut the total daily intake of opioids by one tablet each day Next start to increase the time between doses. The last dose that should be eliminated is the evening dose.   IF YOU ARE TRANSFERRED TO A SKILLED REHAB FACILITY If the  patient is transferred to a skilled rehab facility following release from the hospital, a list of the current medications will be sent to the facility for the patient to continue.  When discharged from the skilled rehab facility, please have the facility set up the patient's [place] prior to being released. Also, the skilled facility will be responsible for providing the patient with their medications at time of release from the facility to include their pain medication, the muscle relaxants, and their blood thinner medication. If the patient is still at the rehab facility at time of the two week follow up appointment, the skilled rehab facility will also need to assist the patient in arranging follow up appointment in our office and any transportation needs.  MAKE SURE YOU:  Understand these instructions.  Get help right away if you are not doing well or get worse.    DENTAL ANTIBIOTICS:  In most cases prophylactic antibiotics for Dental procdeures after total joint surgery are not necessary.  Exceptions are as follows:  1. History of prior total joint infection  2. Severely immunocompromised (Organ Transplant, cancer chemotherapy, Rheumatoid biologic meds such as [place])  3. Poorly controlled diabetes (A1C &gt; 8.0, blood glucose over 200)  If you have one of these conditions, contact your surgeon for an antibiotic prescription, prior to your dental procedure.    Pick up stool softner and laxative for home use following surgery while on pain medications. Do not submerge incision under water. Please use good hand washing techniques while changing dressing each day. May shower starting three days after surgery. Please use a clean towel to pat the incision dry following showers. Continue to use ice for pain and swelling after surgery. Do not use any lotions or creams on the incision until instructed by your surgeon.

## 2022-12-27 NOTE — Interval H&P Note (Signed)
History and Physical Interval Note:  12/27/2022 11:32 AM  Barbara Russell  has presented today for surgery, with the diagnosis of Right hip osteoarthritis.  The various methods of treatment have been discussed with the patient and family. After consideration of risks, benefits and other options for treatment, the patient has consented to  Procedure(s): TOTAL HIP ARTHROPLASTY ANTERIOR APPROACH (Right) as a surgical intervention.  The patient's history has been reviewed, patient examined, no change in status, stable for surgery.  I have reviewed the patient's chart and labs.  Questions were answered to the patient's satisfaction.     Pilar Plate Rockey Guarino

## 2022-12-27 NOTE — Op Note (Signed)
OPERATIVE REPORT- TOTAL HIP ARTHROPLASTY   PREOPERATIVE DIAGNOSIS: Osteoarthritis of the Right hip.   POSTOPERATIVE DIAGNOSIS: Osteoarthritis of the Right  hip.   PROCEDURE: Right total hip arthroplasty, anterior approach.   SURGEON: Gaynelle Arabian, MD   ASSISTANT: Shearon Balo, PA-C  ANESTHESIA:  General  ESTIMATED BLOOD LOSS:-250 mL    DRAINS: None  COMPLICATIONS: None   CONDITION: PACU - hemodynamically stable.   BRIEF CLINICAL NOTE: Barbara Russell is a 89 y.o. female who has advanced end-  stage arthritis of their Right  hip with progressively worsening pain and  dysfunction.The patient has failed nonoperative management and presents for  total hip arthroplasty.   PROCEDURE IN DETAIL: After successful administration of spinal  anesthetic, the traction boots for the [facility] bed were placed on both  feet and the patient was placed onto the [facility] bed, boots placed into the leg  holders. The Right hip was then isolated from the perineum with plastic  drapes and prepped and draped in the usual sterile fashion. ASIS and  greater trochanter were marked and a oblique incision was made, starting  at about 1 cm lateral and 2 cm distal to the ASIS and coursing towards  the anterior cortex of the femur. The skin was cut with a 10 blade  through subcutaneous tissue to the level of the fascia overlying the  tensor fascia lata muscle. The fascia was then incised in line with the  incision at the junction of the anterior third and posterior 2/3rd. The  muscle was teased off the fascia and then the interval between the TFL  and the rectus was developed. The Hohmann retractor was then placed at  the top of the femoral neck over the capsule. The vessels overlying the  capsule were cauterized and the fat on top of the capsule was removed.  A Hohmann retractor was then placed anterior underneath the rectus  femoris to give exposure to the entire anterior capsule. A T-shaped   capsulotomy was performed. The edges were tagged and the femoral head  was identified.       Osteophytes are removed off the superior acetabulum.  The femoral neck was then cut in situ with an oscillating saw. Traction  was then applied to the left lower extremity utilizing the [facility]  traction. The femoral head was then removed. Retractors were placed  around the acetabulum and then circumferential removal of the labrum was  performed. Osteophytes were also removed. Reaming starts at 45 mm to  medialize and  Increased in 2 mm increments to 47 mm. We reamed in  approximately 40 degrees of abduction, 20 degrees anteversion. A 48 mm  pinnacle acetabular shell was then impacted in anatomic position under  fluoroscopic guidance with excellent purchase. We did not need to place  any additional dome screws. A 28 mm neutral + 4 marathon liner was then  placed into the acetabular shell.       The femoral lift was then placed along the lateral aspect of the femur  just distal to the vastus ridge. The leg was  externally rotated and capsule  was stripped off the inferior aspect of the femoral neck down to the  level of the lesser trochanter, this was done with electrocautery. The femur was lifted after this was performed. The  leg was then placed in an extended and adducted position essentially delivering the femur. We also removed the capsule superiorly and the piriformis from the piriformis fossa to  gain excellent exposure of the  proximal femur. Rongeur was used to remove some cancellous bone to get  into the lateral portion of the proximal femur for placement of the  initial starter reamer. The starter broaches was placed  the starter broach  and was shown to go down the center of the canal. Broaching  with the Actis system was then performed starting at size 0  coursing  Up to size 4. A size 4 had excellent torsional and rotational  and axial stability. The trial standard offset neck was then  placed  with a 28 + 1,5 trial head. The hip was then reduced. We confirmed that  the stem was in the canal both on AP and lateral x-rays. It also has excellent sizing. The hip was reduced with outstanding stability through full extension and full external rotation.. AP pelvis was taken and the leg lengths were measured and found to be equal. Hip was then dislocated again and the femoral head and neck removed. The  femoral broach was removed. Size 4 Actis stem with a standard offset  neck was then impacted into the femur following native anteversion. Has  excellent purchase in the canal. Excellent torsional and rotational and  axial stability. It is confirmed to be in the canal on AP and lateral  fluoroscopic views. The 28 + 1.5 metal head was placed and the hip  reduced with outstanding stability. Again AP pelvis was taken and it  confirmed that the leg lengths were equal. The wound was then copiously  irrigated with saline solution and the capsule reattached and repaired  with Ethibond suture. 30 ml of .25% Bupivicaine was  injected into the capsule and into the edge of the tensor fascia lata as well as subcutaneous tissue. The fascia overlying the tensor fascia lata was then closed with a running #1 V-Loc. Subcu was closed with interrupted 2-0 Vicryl and subcuticular running 4-0 Monocryl. Incision was cleaned  and dried. Steri-Strips and a bulky sterile dressing applied. The patient was awakened and transported to  recovery in stable condition.        Please note that a surgical assistant was a medical necessity for this procedure to perform it in a safe and expeditious manner. Assistant was necessary to provide appropriate retraction of vital neurovascular structures and to prevent femoral fracture and allow for anatomic placement of the prosthesis.  Gaynelle Arabian, M.D.

## 2022-12-27 NOTE — Transfer of Care (Signed)
Immediate Anesthesia Transfer of Care Note  Patient: Barbara Russell  Procedure(s) Performed: TOTAL HIP ARTHROPLASTY ANTERIOR APPROACH (Right: Hip)  Patient Location: PACU  Anesthesia Type:General  Level of Consciousness: sedated, patient cooperative, and responds to stimulation  Airway & Oxygen Therapy: Patient Spontanous Breathing and Patient connected to face mask oxygen  Post-op Assessment: Report given to RN and Post -op Vital signs reviewed and stable  Post vital signs: Reviewed and stable  Last Vitals:  Vitals Value Taken Time  BP 149/61 12/27/22 1541  Temp    Pulse 64 12/27/22 1543  Resp 14 12/27/22 1543  SpO2 100 % 12/27/22 1543  Vitals shown include unvalidated device data.  Last Pain:  Vitals:   12/27/22 1200  TempSrc:   PainSc: 4          Complications: No notable events documented.

## 2022-12-27 NOTE — Anesthesia Procedure Notes (Signed)
Procedure Name: LMA Insertion Date/Time: 12/27/2022 2:24 PM  Performed by: Gean Maidens, CRNAPre-anesthesia Checklist: Patient identified, Emergency Drugs available, Suction available, Patient being monitored and Timeout performed Patient Re-evaluated:Patient Re-evaluated prior to induction Oxygen Delivery Method: Circle system utilized Preoxygenation: Pre-oxygenation with 100% oxygen Induction Type: IV induction Ventilation: Mask ventilation without difficulty LMA: LMA inserted LMA Size: 3.0 Number of attempts: 1 Placement Confirmation: positive ETCO2 and breath sounds checked- equal and bilateral Tube secured with: Tape Dental Injury: Teeth and Oropharynx as per pre-operative assessment

## 2022-12-27 NOTE — Anesthesia Procedure Notes (Signed)
Spinal  Patient location during procedure: OR Start time: 12/27/2022 1:53 PM End time: 12/27/2022 1:58 PM Reason for block: surgical anesthesia Staffing Performed: anesthesiologist  Anesthesiologist: Lynda Rainwater, MD Performed by: Lynda Rainwater, MD Authorized by: Lynda Rainwater, MD   Preanesthetic Checklist Completed: patient identified, IV checked, site marked, risks and benefits discussed, surgical consent, monitors and equipment checked, pre-op evaluation and timeout performed Spinal Block Patient position: sitting Prep: DuraPrep Patient monitoring: heart rate, cardiac monitor, continuous pulse ox and blood pressure Approach: midline Location: L3-4 Injection technique: single-shot Needle Needle type: Quincke  Needle gauge: 22 G Needle length: 9 cm Assessment Sensory level: T4 Events: CSF return and second provider Additional Notes 1st attempt by CRNA unsuccessful.  2nd attempt successful by anesthesiologist with 22g Quincke

## 2022-12-28 ENCOUNTER — Encounter (HOSPITAL_COMMUNITY): Payer: Self-pay | Admitting: Orthopedic Surgery

## 2022-12-28 ENCOUNTER — Other Ambulatory Visit (HOSPITAL_COMMUNITY): Payer: Self-pay

## 2022-12-28 DIAGNOSIS — M1611 Unilateral primary osteoarthritis, right hip: Secondary | ICD-10-CM | POA: Diagnosis not present

## 2022-12-28 DIAGNOSIS — Z96651 Presence of right artificial knee joint: Secondary | ICD-10-CM | POA: Diagnosis not present

## 2022-12-28 DIAGNOSIS — I2510 Atherosclerotic heart disease of native coronary artery without angina pectoris: Secondary | ICD-10-CM | POA: Diagnosis not present

## 2022-12-28 DIAGNOSIS — I129 Hypertensive chronic kidney disease with stage 1 through stage 4 chronic kidney disease, or unspecified chronic kidney disease: Secondary | ICD-10-CM | POA: Diagnosis not present

## 2022-12-28 DIAGNOSIS — Z853 Personal history of malignant neoplasm of breast: Secondary | ICD-10-CM | POA: Diagnosis not present

## 2022-12-28 DIAGNOSIS — N1831 Chronic kidney disease, stage 3a: Secondary | ICD-10-CM | POA: Diagnosis not present

## 2022-12-28 LAB — BASIC METABOLIC PANEL
Anion gap: 7 (ref 5–15)
BUN: 31 mg/dL — ABNORMAL HIGH (ref 8–23)
CO2: 23 mmol/L (ref 22–32)
Calcium: 9 mg/dL (ref 8.9–10.3)
Chloride: 101 mmol/L (ref 98–111)
Creatinine, Ser: 0.75 mg/dL (ref 0.44–1.00)
GFR, Estimated: >60 mL/min (ref >60)
Glucose, Bld: 207 mg/dL — ABNORMAL HIGH (ref 70–99)
Potassium: 4.5 mmol/L (ref 3.5–5.1)
Sodium: 131 mmol/L — ABNORMAL LOW (ref 135–145)

## 2022-12-28 LAB — CBC
HCT: 29.7 % — ABNORMAL LOW (ref 36.0–46.0)
Hemoglobin: 9.8 g/dL — ABNORMAL LOW (ref 12.0–15.0)
MCH: 31.7 pg (ref 26.0–34.0)
MCHC: 33 g/dL (ref 30.0–36.0)
MCV: 96.1 fL (ref 80.0–100.0)
Platelets: 159 10*3/uL (ref 150–400)
RBC: 3.09 MIL/uL — ABNORMAL LOW (ref 3.87–5.11)
RDW: 14.6 % (ref 11.5–15.5)
WBC: 9.5 10*3/uL (ref 4.0–10.5)
nRBC: 0 % (ref 0.0–0.2)

## 2022-12-28 MED ORDER — HYDROCODONE-ACETAMINOPHEN 5-325 MG PO TABS: 1.0000 | ORAL_TABLET | Freq: Four times a day (QID) | ORAL | 0 refills | Status: AC | PRN

## 2022-12-28 MED ORDER — RIVAROXABAN 10 MG PO TABS: 10.0000 mg | ORAL_TABLET | Freq: Every day | ORAL | 0 refills | Status: AC

## 2022-12-28 MED ORDER — METHOCARBAMOL 500 MG PO TABS: 500.0000 mg | ORAL_TABLET | Freq: Four times a day (QID) | ORAL | 0 refills | Status: AC | PRN

## 2022-12-28 NOTE — TOC Transition Note (Signed)
Transition of Care [facility]) - CM/SW Discharge Note  Patient Details  Name: Barbara Russell MRN: [id number] Date of Birth: 1930-03-11  Transition [facility]) CM/SW Contact:  Sherie Don, LCSW Phone Number: 12/28/2022, 11:41 AM  Clinical Narrative: Patient is expected to discharge home after working with PT. CSW met with patient and son to confirm discharge plan and needs. Patient will go home with HHPT; patient is already active with [facility]. Patient has a rolling walker and raised toilet seat at home, so there are no DME needs at this time. TOC signing off.    Final next level of care: Home w Home Health Services Barriers to Discharge: No Barriers Identified  Patient Goals and CMS Choice CMS Medicare.gov Compare Post Acute Care list provided to:: Patient Choice offered to / list presented to : Patient  Discharge Plan and Services Additional resources added to the After Visit Summary for          DME Arranged: N/A DME Agency: NA HH Arranged: PT HH Agency: [facility] Representative spoke with at [place]: Prearranged  Social Determinants of Health ([place]) Interventions [place]: No Food Insecurity (12/27/2022)  Housing: Low Risk  (12/27/2022)  Transportation Needs: No Transportation Needs (12/27/2022)  Utilities: Not At Risk (12/27/2022)  Tobacco Use: Medium Risk (12/27/2022)   Readmission Risk Interventions     No data to display

## 2022-12-28 NOTE — TOC Benefit Eligibility Note (Signed)
Patient Teacher, English as a foreign language completed.    The patient is currently admitted and upon discharge could be taking Xarelto 10 mg.  The current 30 day co-pay is $51.46.   The patient is insured through [employer], [place] Patient Advocate Specialist [place] Patient Advocate Team Direct Number: [phone]  Fax: [phone]

## 2022-12-28 NOTE — Care Plan (Signed)
Ortho Bundle Case Management Note  Patient Details  Name: Barbara Russell MRN: [id number] Date of Birth: [birth year]                  R THA on 12-27-22  DCP: Home with children. Lives in an apt at an [place].  DME: No needs, has a RW  PT: HEP   DME Arranged:  N/A DME Agency:       Additional Comments: Please contact me with any questions of if this plan should need to change.  Marianne Sofia, RN,CCM [facility]  [phone] 12/28/2022, 8:14 AM

## 2022-12-28 NOTE — Progress Notes (Addendum)
Subjective: 1 Day Post-Op Procedure(s) (LRB): TOTAL HIP ARTHROPLASTY ANTERIOR APPROACH (Right) Patient seen in rounds by Dr. Wynelle Link. Patient is well, and has had no acute complaints or problems. Denies SOB or chest pain. Denies calf pain. Foley cath to be removed this AM. Patient reports pain as mild. We will start physical therapy today.   Objective: Vital signs in last 24 hours: Temp:  [96.5 F (35.8 C)-97.7 F (36.5 C)] 97.7 F (36.5 C) (01/18 0506) Pulse Rate:  [64-81] 64 (01/18 0506) Resp:  [10-20] 16 (01/18 0506) BP: (92-159)/(51-98) 122/51 (01/18 0506) SpO2:  [92 %-99 %] 95 % (01/18 0506) Weight:  [57.2 kg] 57.2 kg (01/17 1800)  Intake/Output from previous day:  Intake/Output Summary (Last 24 hours) at 12/28/2022 0711 Last data filed at 12/28/2022 0510 Gross per 24 hour  Intake 1360 ml  Output 1550 ml  Net -190 ml     Intake/Output this shift: No intake/output data recorded.  Labs: Recent Labs    12/28/22 0339  HGB 9.8*   Recent Labs    12/28/22 0339  WBC 9.5  RBC 3.09*  HCT 29.7*  PLT 159   Recent Labs    12/28/22 0339  NA 131*  K 4.5  CL 101  CO2 23  BUN 31*  CREATININE 0.75  GLUCOSE 207*  CALCIUM 9.0   No results for input(s): "LABPT", "INR" in the last 72 hours.  Exam: General - Patient is Alert and Oriented Extremity - Neurologically intact Neurovascular intact Sensation intact distally Dorsiflexion/Plantar flexion intact Dressing - dressing C/D/I Motor Function - intact, moving foot and toes well on exam.  Past Medical History:  Diagnosis Date   Anemia    Breast cancer ([place])    Hx of BC, s/p mastectomy on right 1999, s/p tamoxifen X1 year   Chest pain    EC Stress test 12/21/11 Low risk, no ischemia, normal EF   Chronic kidney disease    Coronary artery disease    Remote Hx of CAD, normal stress test 2009   Diverticulitis    GERD (gastroesophageal reflux disease)    No longer a problem   Hypercholesteremia     Hypertension    Insomnia    Lichen sclerosus    Low back pain    Macular degeneration    Myocardial infarction ([place]) 12/11/1998   Osteoarthrosis, unspecified whether generalized or localized, unspecified site    Pain    Several year Hx of pain/ SOB radiating to her neck   S/P TAVR (transcatheter aortic valve replacement) 10/10/2022   s/p TAVR with a 50m Edwards S3UR via the TF approach by Dr. MAngelena Form& Dr. ETenny Craw  Severe aortic stenosis    UTI (urinary tract infection)     Assessment/Plan: 1 Day Post-Op Procedure(s) (LRB): TOTAL HIP ARTHROPLASTY ANTERIOR APPROACH (Right) Principal Problem:   OA (osteoarthritis) of hip Active Problems:   Osteoarthritis of right hip  Estimated body mass index is 23.06 kg/m as calculated from the following:   Height as of this encounter: '5\' 2"'$  (1.575 m).   Weight as of this encounter: 57.2 kg. Advance diet Up with therapy D/C IV fluids  DVT Prophylaxis - Xarelto Weight bearing as tolerated.  Start physical therapy today. Possible discharge home today pending progress with PT and if meeting goals. We will plan to discharge with HHPT. Follow-up in clinic in 2 weeks.  The PDMP database was reviewed today prior to any opioid medications being prescribed to this patient.  R. AJaynie Bream  PA-C Orthopedic Surgery [phone] 12/28/2022, 7:11 AM

## 2022-12-28 NOTE — Progress Notes (Signed)
Patient stable, dangled overnight.

## 2022-12-28 NOTE — Progress Notes (Signed)
Physical Therapy Treatment Patient Details Name: Barbara Russell MRN: [id number] DOB: 1930/02/22 Today's Date: 12/28/2022   History of Present Illness Barbara Russell is a 89 y.o. female admitted for R THA on 12/27/22.  Past medical history significant for but not limited to CAD, sp TAVR, breast cancer status postmastectomy, CKD, diverticulitis, GERD, HLD, HTN, osteoporosis, chronic diastolic CHF, C diff, macular degeneration, chronic hyponatremia, TKA.    PT Comments    Pt ambulated short distance and then into bathroom prior to return to bed.  Pt performed LE exercises in supine. Pt and son anticipate d/c back to ILF tomorrow.    Recommendations for follow up therapy are one component of a multi-disciplinary discharge planning process, led by the attending physician.  Recommendations may be updated based on patient status, additional functional criteria and insurance authorization.  Follow Up Recommendations  Home health PT     Assistance Recommended at Discharge Set up Supervision/Assistance  Patient can return home with the following A little help with walking and/or transfers;A little help with bathing/dressing/bathroom;Assistance with cooking/housework   Equipment Recommendations  Rolling walker (2 wheels) (youth)    Recommendations for Other Services       Precautions / Restrictions Precautions Precautions: Fall Restrictions Other Position/Activity Restrictions: WBAT     Mobility  Bed Mobility Overal bed mobility: Needs Assistance Bed Mobility: Sit to Supine     Supine to sit: Min guard, HOB elevated Sit to supine: Min assist   General bed mobility comments: assist for R LE    Transfers Overall transfer level: Needs assistance Equipment used: Rolling walker (2 wheels) Transfers: Sit to/from Stand Sit to Stand: Min guard           General transfer comment: cues for UE and LE positioning for pain control    Ambulation/Gait Ambulation/Gait assistance:  Min guard Gait Distance (Feet): 35 Feet Assistive device: Rolling walker (2 wheels) Gait Pattern/deviations: Step-to pattern, Decreased stance time - right, Antalgic Gait velocity: decr     General Gait Details: verbal cues for sequence, RW positioning, step length; distance to tolerance   Stairs             Wheelchair Mobility    Modified Rankin (Stroke Patients Only)       Balance                                            Cognition Arousal/Alertness: Awake/alert Behavior During Therapy: WFL for tasks assessed/performed Overall Cognitive Status: Within Functional Limits for tasks assessed                                          Exercises Total Joint Exercises Ankle Circles/Pumps: AROM, Both, 10 reps Quad Sets: AROM, Right, 10 reps Short Arc Quad: AROM, Right, 10 reps Heel Slides: AROM, Right, 10 reps Hip ABduction/ADduction: AROM, Right, 10 reps    General Comments        Pertinent Vitals/Pain Pain Assessment Pain Assessment: 0-10 Pain Score: 6  Pain Location: right hip Pain Descriptors / Indicators: Sore, Burning Pain Intervention(s): Repositioned, Monitored during session    Home Living                          Prior Function  PT Goals (current goals can now be found in the care plan section) Progress towards PT goals: Progressing toward goals    Frequency    7X/week      PT Plan Current plan remains appropriate    Co-evaluation              AM-PAC PT "6 Clicks" Mobility   Outcome Measure  Help needed turning from your back to your side while in a flat bed without using bedrails?: A Little Help needed moving from lying on your back to sitting on the side of a flat bed without using bedrails?: A Little Help needed moving to and from a bed to a chair (including a wheelchair)?: A Little Help needed standing up from a chair using your arms (e.g., wheelchair or bedside  chair)?: A Little Help needed to walk in hospital room?: A Little Help needed climbing 3-5 steps with a railing? : A Lot 6 Click Score: 17    End of Session Equipment Utilized During Treatment: Gait belt Activity Tolerance: Patient tolerated treatment well Patient left: with call bell/phone within reach;with family/visitor present;in bed   PT Visit Diagnosis: Other abnormalities of gait and mobility (R26.89)     Time: 1438-1500 PT Time Calculation (min) (ACUTE ONLY): 22 min  Charges:  $Therapeutic Exercise: 8-22 mins                     Jannette Spanner PT, DPT Physical Therapist Acute Rehabilitation Services Preferred contact method: Secure Chat Weekend Pager Only: [phone] Office: [phone]    Myrtis Hopping [place] 12/28/2022, 4:05 PM

## 2022-12-28 NOTE — Evaluation (Signed)
Physical Therapy Evaluation Patient Details Name: Barbara Russell MRN: [id number] DOB: 03/31/1930 Today's Date: 12/28/2022  History of Present Illness  Barbara Russell is a 89 y.o. female admitted for R THA on 12/27/22.  Past medical history significant for but not limited to CAD, sp TAVR, breast cancer status postmastectomy, CKD, diverticulitis, GERD, HLD, HTN, osteoporosis, chronic diastolic CHF, C diff, macular degeneration, chronic hyponatremia, TKA.  Clinical Impression  Pt is s/p THA resulting in the deficits listed below (see PT Problem List).  Pt will benefit from skilled PT to increase their independence and safety with mobility to allow discharge to the venue listed below.   Pt able to ambulate short distance in room and then requested assist to bathroom.  Pt plans to return home to ILF with increased care per report from son present today.  Pt has rollator and son working on obtaining RW (youth size).          Recommendations for follow up therapy are one component of a multi-disciplinary discharge planning process, led by the attending physician.  Recommendations may be updated based on patient status, additional functional criteria and insurance authorization.  Follow Up Recommendations Home health PT      Assistance Recommended at Discharge Set up Supervision/Assistance  Patient can return home with the following  A little help with walking and/or transfers;A little help with bathing/dressing/bathroom;Assistance with cooking/housework    Equipment Recommendations Rolling walker (2 wheels) (youth)  Recommendations for Other Services       Functional Status Assessment Patient has had a recent decline in their functional status and demonstrates the ability to make significant improvements in function in a reasonable and predictable amount of time.     Precautions / Restrictions Precautions Precautions: Fall Restrictions Weight Bearing Restrictions: No Other  Position/Activity Restrictions: WBAT      Mobility  Bed Mobility Overal bed mobility: Needs Assistance Bed Mobility: Supine to Sit     Supine to sit: Min guard, HOB elevated     General bed mobility comments: multimodal cues for technique    Transfers Overall transfer level: Needs assistance Equipment used: Rolling walker (2 wheels) Transfers: Sit to/from Stand Sit to Stand: Min assist           General transfer comment: assist to rise and steady initially, cues for UE and LE positioning for pain control    Ambulation/Gait Ambulation/Gait assistance: Min guard Gait Distance (Feet): 25 Feet Assistive device: Rolling walker (2 wheels) Gait Pattern/deviations: Step-to pattern, Decreased stance time - right, Antalgic Gait velocity: decr     General Gait Details: verbal cues for sequence, RW positioning, step length; distance to tolerance  Stairs            Wheelchair Mobility    Modified Rankin (Stroke Patients Only)       Balance                                             Pertinent Vitals/Pain Pain Assessment Pain Assessment: 0-10 Pain Score: 4  Pain Location: right hip Pain Descriptors / Indicators: Sore, Aching, Burning Pain Intervention(s): Repositioned, Monitored during session    Home Living       Type of Home: Independent living facility           Home Equipment: Conservation officer, nature (2 wheels)      Prior Function Prior Level of  Function : Independent/Modified Independent             Mobility Comments: using rollator at all times       Hand Dominance        Extremity/Trunk Assessment        Lower Extremity Assessment Lower Extremity Assessment: RLE deficits/detail RLE Deficits / Details: anticipated post op hip weakness, able to perform ankle pumps       Communication   Communication: HOH  Cognition Arousal/Alertness: Awake/alert Behavior During Therapy: WFL for tasks  assessed/performed Overall Cognitive Status: Within Functional Limits for tasks assessed                                          General Comments      Exercises     Assessment/Plan    PT Assessment Patient needs continued PT services  PT Problem List Decreased strength;Decreased activity tolerance;Decreased balance;Decreased mobility;Decreased knowledge of use of DME;Pain       PT Treatment Interventions Stair training;Gait training;DME instruction;Therapeutic exercise;Balance training;Functional mobility training;Therapeutic activities;Patient/family education    PT Goals (Current goals can be found in the Care Plan section)  Acute Rehab PT Goals PT Goal Formulation: With patient/family Time For Goal Achievement: 01/04/23 Potential to Achieve Goals: Good    Frequency 7X/week     Co-evaluation               AM-PAC PT "6 Clicks" Mobility  Outcome Measure Help needed turning from your back to your side while in a flat bed without using bedrails?: A Little Help needed moving from lying on your back to sitting on the side of a flat bed without using bedrails?: A Little Help needed moving to and from a bed to a chair (including a wheelchair)?: A Little Help needed standing up from a chair using your arms (e.g., wheelchair or bedside chair)?: A Little Help needed to walk in hospital room?: A Little Help needed climbing 3-5 steps with a railing? : A Lot 6 Click Score: 17    End of Session Equipment Utilized During Treatment: Gait belt Activity Tolerance: Patient tolerated treatment well Patient left: in chair;with call bell/phone within reach;with chair alarm set;with family/visitor present Nurse Communication: Mobility status PT Visit Diagnosis: Other abnormalities of gait and mobility (R26.89)    Time: 3159-4585 PT Time Calculation (min) (ACUTE ONLY): 23 min   Charges:   PT Evaluation $PT Eval Low Complexity: 1 Low PT Treatments $Gait  Training: 8-22 mins       Jannette Spanner PT, DPT Physical Therapist Acute Rehabilitation Services Preferred contact method: Secure Chat Weekend Pager Only: [phone] Office: [phone]   Kati L Payson 12/28/2022, 11:26 AM

## 2022-12-29 DIAGNOSIS — N1831 Chronic kidney disease, stage 3a: Secondary | ICD-10-CM | POA: Diagnosis not present

## 2022-12-29 DIAGNOSIS — I129 Hypertensive chronic kidney disease with stage 1 through stage 4 chronic kidney disease, or unspecified chronic kidney disease: Secondary | ICD-10-CM | POA: Diagnosis not present

## 2022-12-29 DIAGNOSIS — Z96651 Presence of right artificial knee joint: Secondary | ICD-10-CM | POA: Diagnosis not present

## 2022-12-29 DIAGNOSIS — Z853 Personal history of malignant neoplasm of breast: Secondary | ICD-10-CM | POA: Diagnosis not present

## 2022-12-29 DIAGNOSIS — I2510 Atherosclerotic heart disease of native coronary artery without angina pectoris: Secondary | ICD-10-CM | POA: Diagnosis not present

## 2022-12-29 DIAGNOSIS — M1611 Unilateral primary osteoarthritis, right hip: Secondary | ICD-10-CM | POA: Diagnosis not present

## 2022-12-29 LAB — CBC
HCT: 28.6 % — ABNORMAL LOW (ref 36.0–46.0)
Hemoglobin: 9.5 g/dL — ABNORMAL LOW (ref 12.0–15.0)
MCH: 31.4 pg (ref 26.0–34.0)
MCHC: 33.2 g/dL (ref 30.0–36.0)
MCV: 94.4 fL (ref 80.0–100.0)
Platelets: 148 10*3/uL — ABNORMAL LOW (ref 150–400)
RBC: 3.03 MIL/uL — ABNORMAL LOW (ref 3.87–5.11)
RDW: 15.1 % (ref 11.5–15.5)
WBC: 8.2 10*3/uL (ref 4.0–10.5)
nRBC: 0 % (ref 0.0–0.2)

## 2022-12-29 NOTE — Progress Notes (Signed)
   Subjective: 2 Days Post-Op Procedure(s) (LRB): TOTAL HIP ARTHROPLASTY ANTERIOR APPROACH (Right) Patient seen in rounds by Dr. Wynelle Link. Patient is well, and has had no acute complaints or problems. Denies SOB or chest pain. Denies calf pain. Voiding without difficulty. Patient reports pain as mild.  Worked with physical therapy yesterday which went well.  Objective: Vital signs in last 24 hours: Temp:  [97.5 F (36.4 C)-98.9 F (37.2 C)] 98.2 F (36.8 C) (01/19 0603) Pulse Rate:  [70-75] 75 (01/19 0603) Resp:  [14-18] 16 (01/19 0603) BP: (109-140)/(35-86) 140/45 (01/19 0603) SpO2:  [93 %-100 %] 96 % (01/19 0603)  Intake/Output from previous day:  Intake/Output Summary (Last 24 hours) at 12/29/2022 0732 Last data filed at 12/28/2022 2139 Gross per 24 hour  Intake 869.13 ml  Output 400 ml  Net 469.13 ml    Intake/Output this shift: No intake/output data recorded.  Labs: Recent Labs    12/28/22 0339 12/29/22 0334  HGB 9.8* 9.5*   Recent Labs    12/28/22 0339 12/29/22 0334  WBC 9.5 8.2  RBC 3.09* 3.03*  HCT 29.7* 28.6*  PLT 159 148*   Recent Labs    12/28/22 0339  NA 131*  K 4.5  CL 101  CO2 23  BUN 31*  CREATININE 0.75  GLUCOSE 207*  CALCIUM 9.0   No results for input(s): "LABPT", "INR" in the last 72 hours.  Exam: General - Patient is Alert and Oriented Extremity - Neurologically intact Neurovascular intact Sensation intact distally Dorsiflexion/Plantar flexion intact Dressing/Incision - clean, dry, no drainage Motor Function - intact, moving foot and toes well on exam.  Past Medical History:  Diagnosis Date   Anemia    Breast cancer ([place])    Hx of BC, s/p mastectomy on right 1999, s/p tamoxifen X1 year   Chest pain    EC Stress test 12/21/11 Low risk, no ischemia, normal EF   Chronic kidney disease    Coronary artery disease    Remote Hx of CAD, normal stress test 2009   Diverticulitis    GERD (gastroesophageal reflux disease)    No  longer a problem   Hypercholesteremia    Hypertension    Insomnia    Lichen sclerosus    Low back pain    Macular degeneration    Myocardial infarction ([place]) 12/11/1998   Osteoarthrosis, unspecified whether generalized or localized, unspecified site    Pain    Several year Hx of pain/ SOB radiating to her neck   S/P TAVR (transcatheter aortic valve replacement) 10/10/2022   s/p TAVR with a 69m Edwards S3UR via the TF approach by Dr. MAngelena Form& Dr. ETenny Craw  Severe aortic stenosis    UTI (urinary tract infection)     Assessment/Plan: 2 Days Post-Op Procedure(s) (LRB): TOTAL HIP ARTHROPLASTY ANTERIOR APPROACH (Right) Principal Problem:   OA (osteoarthritis) of hip Active Problems:   Osteoarthritis of right hip  Estimated body mass index is 23.06 kg/m as calculated from the following:   Height as of this encounter: '5\' 2"'$  (1.575 m).   Weight as of this encounter: 57.2 kg.  DVT Prophylaxis - Xarelto Weight-bearing as tolerated.  Continue physical therapy. Expected discharge home today with HHPT. Follow-up in clinic in 2 weeks. Scripts have already been sent to pharmacy.  R. AJaynie Bream PA-C Orthopedic Surgery ([phone]/19/2024, 7:32 AM

## 2022-12-29 NOTE — Plan of Care (Signed)
  Problem: Education: Goal: Knowledge of the prescribed therapeutic regimen will improve Outcome: Adequate for Discharge Goal: Understanding of discharge needs will improve Outcome: Adequate for Discharge   Problem: Activity: Goal: Ability to avoid complications of mobility impairment will improve Outcome: Adequate for Discharge Goal: Ability to tolerate increased activity will improve Outcome: Adequate for Discharge   Problem: Clinical Measurements: Goal: Postoperative complications will be avoided or minimized Outcome: Adequate for Discharge   Problem: Pain Management: Goal: Pain level will decrease with appropriate interventions Outcome: Adequate for Discharge   Problem: Skin Integrity: Goal: Will show signs of wound healing Outcome: Adequate for Discharge   Problem: Education: Goal: Knowledge of General Education information will improve Description: Including pain rating scale, medication(s)/side effects and non-pharmacologic comfort measures Outcome: Adequate for Discharge   Problem: Health Behavior/Discharge Planning: Goal: Ability to manage health-related needs will improve Outcome: Adequate for Discharge   Problem: Clinical Measurements: Goal: Ability to maintain clinical measurements within normal limits will improve Outcome: Adequate for Discharge Goal: Will remain free from infection Outcome: Adequate for Discharge Goal: Diagnostic test results will improve Outcome: Adequate for Discharge Goal: Respiratory complications will improve Outcome: Adequate for Discharge Goal: Cardiovascular complication will be avoided Outcome: Adequate for Discharge   Problem: Activity: Goal: Risk for activity intolerance will decrease Outcome: Adequate for Discharge   Problem: Coping: Goal: Level of anxiety will decrease Outcome: Adequate for Discharge   Problem: Elimination: Goal: Will not experience complications related to bowel motility Outcome: Adequate for  Discharge   Problem: Pain Managment: Goal: General experience of comfort will improve Outcome: Adequate for Discharge   Problem: Safety: Goal: Ability to remain free from injury will improve Outcome: Adequate for Discharge

## 2022-12-29 NOTE — Plan of Care (Signed)
Plan of care reviewed and discussed. °

## 2022-12-29 NOTE — Progress Notes (Signed)
Physical Therapy Treatment Patient Details Name: Barbara Russell MRN: [id number] DOB: Feb 01, 1930 Today's Date: 12/29/2022   History of Present Illness Barbara Russell is a 89 y.o. female admitted for R THA on 12/27/22.  Past medical history significant for but not limited to CAD, sp TAVR, breast cancer status postmastectomy, CKD, diverticulitis, GERD, HLD, HTN, osteoporosis, chronic diastolic CHF, C diff, macular degeneration, chronic hyponatremia, TKA.    PT Comments    POD # 2 General Comments: AxO x 3 very pleasant Lady who resides at [facility].  Son present during session.   Assisted OOB to amb in hallway required increased time but went well.  General bed mobility comments: required increased time and effort.  Son reports, Pt sleeps in a recliner and has "for some time now". General transfer comment: cues for UE and LE positioning for pain control   General Gait Details: verbal cues for sequence, RW positioning, step length; distance to tolerance Unable to tolerate any TE's due to increased pain.  Pain meds requested. Pt HAS met her mobility goals to D/C back to her [place] with [facility] PT and family support.  Recommendations for follow up therapy are one component of a multi-disciplinary discharge planning process, led by the attending physician.  Recommendations may be updated based on patient status, additional functional criteria and insurance authorization.  Follow Up Recommendations  Home health PT     Assistance Recommended at Discharge Set up Supervision/Assistance  Patient can return home with the following A little help with walking and/or transfers;A little help with bathing/dressing/bathroom;Assistance with cooking/housework   Equipment Recommendations  Rolling walker (2 wheels) (youth)    Recommendations for Other Services       Precautions / Restrictions Precautions Precautions: Fall Restrictions Weight Bearing Restrictions: No Other  Position/Activity Restrictions: WBAT     Mobility  Bed Mobility Overal bed mobility: Needs Assistance Bed Mobility: Supine to Sit     Supine to sit: Min guard, HOB elevated, Min assist     General bed mobility comments: required increased time and effort.  Son reports, Pt sleeps in a recliner and has "for some time now".    Transfers Overall transfer level: Needs assistance Equipment used: Rolling walker (2 wheels) Transfers: Sit to/from Stand Sit to Stand: Supervision, Min guard           General transfer comment: cues for UE and LE positioning for pain control    Ambulation/Gait Ambulation/Gait assistance: Supervision, Min guard Gait Distance (Feet): 55 Feet Assistive device: Rolling walker (2 wheels) Gait Pattern/deviations: Step-to pattern, Decreased stance time - right, Antalgic Gait velocity: decr     General Gait Details: verbal cues for sequence, RW positioning, step length; distance to tolerance   Stairs             Wheelchair Mobility    Modified Rankin (Stroke Patients Only)       Balance                                            Cognition Arousal/Alertness: Awake/alert Behavior During Therapy: WFL for tasks assessed/performed Overall Cognitive Status: Within Functional Limits for tasks assessed                                 General Comments: AxO x 3 very  pleasant Lady who resides at [facility].        Exercises      General Comments        Pertinent Vitals/Pain Pain Assessment Pain Assessment: Faces Faces Pain Scale: Hurts little more Pain Location: right hip Pain Descriptors / Indicators: Sore, Burning, Operative site guarding, Discomfort Pain Intervention(s): Monitored during session, Repositioned, Patient requesting pain meds-RN notified, Ice applied    Home Living                          Prior Function            PT Goals (current goals can now be found in  the care plan section) Progress towards PT goals: Progressing toward goals    Frequency    7X/week      PT Plan Current plan remains appropriate    Co-evaluation              AM-PAC PT "6 Clicks" Mobility   Outcome Measure  Help needed turning from your back to your side while in a flat bed without using bedrails?: A Little Help needed moving from lying on your back to sitting on the side of a flat bed without using bedrails?: A Little Help needed moving to and from a bed to a chair (including a wheelchair)?: A Little Help needed standing up from a chair using your arms (e.g., wheelchair or bedside chair)?: A Little Help needed to walk in hospital room?: A Little Help needed climbing 3-5 steps with a railing? : A Lot 6 Click Score: 17    End of Session Equipment Utilized During Treatment: Gait belt Activity Tolerance: Patient tolerated treatment well Patient left: with call bell/phone within reach;with family/visitor present;in bed Nurse Communication: Mobility status PT Visit Diagnosis: Other abnormalities of gait and mobility (R26.89)     Time: 7616-0737 PT Time Calculation (min) (ACUTE ONLY): 24 min  Charges:  $Gait Training: 8-22 mins $Therapeutic Activity: 8-22 mins                     Rica Koyanagi  PTA [place] [facility] M-F          [phone] Weekend pager [phone]

## 2022-12-30 NOTE — Discharge Summary (Signed)
Physician Discharge Summary   Patient ID: Barbara Russell MRN: [id number] DOB/AGE: [birth year] 89 y.o.  Admit date: 12/27/2022 Discharge date: 12/29/2022  Primary Diagnosis: Osteoarthritis of the right hip    Admission Diagnoses:  Past Medical History:  Diagnosis Date   Anemia    Breast cancer ([place])    Hx of BC, s/p mastectomy on right 1999, s/p tamoxifen X1 year   Chest pain    EC Stress test 12/21/11 Low risk, no ischemia, normal EF   Chronic kidney disease    Coronary artery disease    Remote Hx of CAD, normal stress test 2009   Diverticulitis    GERD (gastroesophageal reflux disease)    No longer a problem   Hypercholesteremia    Hypertension    Insomnia    Lichen sclerosus    Low back pain    Macular degeneration    Myocardial infarction ([place]) 12/11/1998   Osteoarthrosis, unspecified whether generalized or localized, unspecified site    Pain    Several year Hx of pain/ SOB radiating to her neck   S/P TAVR (transcatheter aortic valve replacement) 10/10/2022   s/p TAVR with a 16m Edwards S3UR via the TF approach by Dr. MAngelena Form& Dr. ETenny Craw  Severe aortic stenosis    UTI (urinary tract infection)    Discharge Diagnoses:   Principal Problem:   OA (osteoarthritis) of hip Active Problems:   Osteoarthritis of right hip  Estimated body mass index is 23.06 kg/m as calculated from the following:   Height as of this encounter: '5\' 2"'$  (1.575 m).   Weight as of this encounter: 57.2 kg.  Procedure:  Procedure(s) (LRB): TOTAL HIP ARTHROPLASTY ANTERIOR APPROACH (Right)   Consults: None  HPI: Barbara Russell is a 89y.o. female who has advanced end-stage arthritis of their Right  hip with progressively worsening pain and dysfunction.The patient has failed nonoperative management and presents for total hip arthroplasty.   Laboratory Data: Admission on 12/27/2022, Discharged on 12/29/2022  Component Date Value Ref Range Status   ABO/RH(D) 12/27/2022 O POS   Final    Antibody Screen 12/27/2022 NEG   Final   Sample Expiration 12/27/2022    Final                   Value:12/30/2022,2359 Performed at [facility] [address], [place]   WBC 12/28/2022 9.5  4.0 - 10.5 K/uL Final   RBC 12/28/2022 3.09 (L)  3.87 - 5.11 MIL/uL Final   Hemoglobin 12/28/2022 9.8 (L)  12.0 - 15.0 g/dL Final   HCT 12/28/2022 29.7 (L)  36.0 - 46.0 % Final   MCV 12/28/2022 96.1  80.0 - 100.0 fL Final   MCH 12/28/2022 31.7  26.0 - 34.0 pg Final   MCHC 12/28/2022 33.0  30.0 - 36.0 g/dL Final   RDW 12/28/2022 14.6  11.5 - 15.5 % Final   Platelets 12/28/2022 159  150 - 400 K/uL Final   nRBC 12/28/2022 0.0  0.0 - 0.2 % Final   Performed at [facility] [address], [place]  Sodium 12/28/2022 131 (L)  135 - 145 mmol/L Final   Potassium 12/28/2022 4.5  3.5 - 5.1 mmol/L Final   Chloride 12/28/2022 101  98 - 111 mmol/L Final   CO2 12/28/2022 23  22 - 32 mmol/L Final   Glucose, Bld 12/28/2022 207 (H)  70 - 99 mg/dL Final   Glucose reference  range applies only to samples taken after fasting for at least 8 hours.   BUN 12/28/2022 31 (H)  8 - 23 mg/dL Final   Creatinine, Ser 12/28/2022 0.75  0.44 - 1.00 mg/dL Final   Calcium 12/28/2022 9.0  8.9 - 10.3 mg/dL Final   GFR, Estimated 12/28/2022 >60  >60 mL/min Final   Comment: (NOTE) Calculated using the CKD-EPI Creatinine Equation (2021)    Anion gap 12/28/2022 7  5 - 15 Final   Performed at [facility], [place] [address] [place], [place] [address]   WBC 12/29/2022 8.2  4.0 - 10.5 K/uL Final   RBC 12/29/2022 3.03 (L)  3.87 - 5.11 MIL/uL Final   Hemoglobin 12/29/2022 9.5 (L)  12.0 - 15.0 g/dL Final   HCT 12/29/2022 28.6 (L)  36.0 - 46.0 % Final   MCV 12/29/2022 94.4  80.0 - 100.0 fL Final   MCH 12/29/2022 31.4  26.0 - 34.0 pg Final   MCHC 12/29/2022 33.2  30.0 - 36.0 g/dL Final   RDW 12/29/2022 15.1  11.5 - 15.5 % Final   Platelets  12/29/2022 148 (L)  150 - 400 K/uL Final   nRBC 12/29/2022 0.0  0.0 - 0.2 % Final   Performed at [facility], [place] [address] [place], [place] [address]  Hospital Outpatient Visit on 12/18/2022  Component Date Value Ref Range Status   MRSA, PCR 12/18/2022 NEGATIVE  NEGATIVE Final   Staphylococcus aureus 12/18/2022 NEGATIVE  NEGATIVE Final   Comment: (NOTE) The Xpert SA Assay (FDA approved for NASAL specimens in patients 64 years of age and older), is one component of a comprehensive surveillance program. It is not intended to diagnose infection nor to guide or monitor treatment. Performed at [facility], [place] [address], [place], [place] [address]    Hgb A1c MFr Bld 12/18/2022 6.1 (H)  4.8 - 5.6 % Final   Comment: (NOTE)         Prediabetes: 5.7 - 6.4         Diabetes: >6.4         Glycemic control for adults with diabetes: <7.0    Mean Plasma Glucose 12/18/2022 128  mg/dL Final   Comment: (NOTE) Performed At: [facility] [place], [place] [id number] Rush Farmer MD [id number]    Sodium 12/18/2022 135  135 - 145 mmol/L Final   Potassium 12/18/2022 5.0  3.5 - 5.1 mmol/L Final   Chloride 12/18/2022 102  98 - 111 mmol/L Final   CO2 12/18/2022 24  22 - 32 mmol/L Final   Glucose, Bld 12/18/2022 105 (H)  70 - 99 mg/dL Final   Glucose reference range applies only to samples taken after fasting for at least 8 hours.   BUN 12/18/2022 31 (H)  8 - 23 mg/dL Final   Creatinine, Ser 12/18/2022 0.89  0.44 - 1.00 mg/dL Final   Calcium 12/18/2022 9.8  8.9 - 10.3 mg/dL Final   GFR, Estimated 12/18/2022 >60  >60 mL/min Final   Comment: (NOTE) Calculated using the CKD-EPI Creatinine Equation (2021)    Anion gap 12/18/2022 9  5 - 15 Final   Performed at [facility], [place] [address] [place], [place] [address]   WBC 12/18/2022 7.5  4.0 - 10.5 K/uL Final   RBC 12/18/2022 3.87  3.87 - 5.11 MIL/uL Final    Hemoglobin 12/18/2022 11.9 (L)  12.0 - 15.0 g/dL Final   HCT 12/18/2022 36.9  36.0 -  46.0 % Final   MCV 12/18/2022 95.3  80.0 - 100.0 fL Final   MCH 12/18/2022 30.7  26.0 - 34.0 pg Final   MCHC 12/18/2022 32.2  30.0 - 36.0 g/dL Final   RDW 12/18/2022 14.9  11.5 - 15.5 % Final   Platelets 12/18/2022 219  150 - 400 K/uL Final   nRBC 12/18/2022 0.0  0.0 - 0.2 % Final   Performed at [facility], [place] [address] [place], [place] [address]  Office Visit on 11/15/2022  Component Date Value Ref Range Status   WBC 11/15/2022 9.4  3.4 - 10.8 x10E3/uL Final   RBC 11/15/2022 4.06  3.77 - 5.28 x10E6/uL Final   Hemoglobin 11/15/2022 12.5  11.1 - 15.9 g/dL Final   Hematocrit 11/15/2022 38.0  34.0 - 46.6 % Final   MCV 11/15/2022 94  79 - 97 fL Final   MCH 11/15/2022 30.8  26.6 - 33.0 pg Final   MCHC 11/15/2022 32.9  31.5 - 35.7 g/dL Final   RDW 11/15/2022 12.7  11.7 - 15.4 % Final   Platelets 11/15/2022 211  150 - 450 x10E3/uL Final   Glucose 11/15/2022 100 (H)  70 - 99 mg/dL Final   BUN 11/15/2022 25  10 - 36 mg/dL Final   Creatinine, Ser 11/15/2022 1.04 (H)  0.57 - 1.00 mg/dL Final   eGFR 11/15/2022 51 (L)  >59 mL/min/1.73 Final   BUN/Creatinine Ratio 11/15/2022 24  12 - 28 Final   Sodium 11/15/2022 134  134 - 144 mmol/L Final   Potassium 11/15/2022 5.0  3.5 - 5.2 mmol/L Final   Chloride 11/15/2022 92 (L)  96 - 106 mmol/L Final   CO2 11/15/2022 27  20 - 29 mmol/L Final   Calcium 11/15/2022 10.2  8.7 - 10.3 mg/dL Final  Appointment on 11/15/2022  Component Date Value Ref Range Status   Area-P 1/2 11/15/2022 2.76  cm2 Final   S' Lateral 11/15/2022 3.10  cm Final   Ao pk vel 11/15/2022 2.05  m/s Final   AV Mean grad 11/15/2022 9.0  mmHg Final   AV Peak grad 11/15/2022 16.8  mmHg Final   AV Area mean vel 11/15/2022 1.74  cm2 Final   AV Area VTI 11/15/2022 1.84  cm2 Final   AR max vel 11/15/2022 1.61  cm2 Final  Admission on 11/05/2022, Discharged on 11/07/2022  Component  Date Value Ref Range Status   Sodium 11/05/2022 132 (L)  135 - 145 mmol/L Final   Potassium 11/05/2022 4.6  3.5 - 5.1 mmol/L Final   HEMOLYSIS AT THIS LEVEL MAY AFFECT RESULT   Chloride 11/05/2022 104  98 - 111 mmol/L Final   CO2 11/05/2022 17 (L)  22 - 32 mmol/L Final   Glucose, Bld 11/05/2022 95  70 - 99 mg/dL Final   Glucose reference range applies only to samples taken after fasting for at least 8 hours.   BUN 11/05/2022 22  8 - 23 mg/dL Final   Creatinine, Ser 11/05/2022 0.89  0.44 - 1.00 mg/dL Final   Calcium 11/05/2022 8.0 (L)  8.9 - 10.3 mg/dL Final   Total Protein 11/05/2022 5.7 (L)  6.5 - 8.1 g/dL Final   Albumin 11/05/2022 3.1 (L)  3.5 - 5.0 g/dL Final   AST 11/05/2022 26  15 - 41 U/L Final   HEMOLYSIS AT THIS LEVEL MAY AFFECT RESULT   ALT 11/05/2022 13  0 - 44 U/L Final   HEMOLYSIS AT THIS LEVEL MAY AFFECT RESULT   Alkaline  Phosphatase 11/05/2022 71  38 - 126 U/L Final   Total Bilirubin 11/05/2022 1.3 (H)  0.3 - 1.2 mg/dL Final   HEMOLYSIS AT THIS LEVEL MAY AFFECT RESULT   GFR, Estimated 11/05/2022 >60  >60 mL/min Final   Comment: (NOTE) Calculated using the CKD-EPI Creatinine Equation (2021)    Anion gap 11/05/2022 11  5 - 15 Final   Performed at [facility], [place] [address] [place], [place] [address]   WBC 11/05/2022 8.2  4.0 - 10.5 K/uL Final   RBC 11/05/2022 2.36 (L)  3.87 - 5.11 MIL/uL Final   Hemoglobin 11/05/2022 7.3 (L)  12.0 - 15.0 g/dL Final   HCT 11/05/2022 24.5 (L)  36.0 - 46.0 % Final   MCV 11/05/2022 103.8 (H)  80.0 - 100.0 fL Final   MCH 11/05/2022 30.9  26.0 - 34.0 pg Final   MCHC 11/05/2022 29.8 (L)  30.0 - 36.0 g/dL Final   RDW 11/05/2022 13.6  11.5 - 15.5 % Final   Platelets 11/05/2022 127 (L)  150 - 400 K/uL Final   nRBC 11/05/2022 0.0  0.0 - 0.2 % Final   Neutrophils Relative % 11/05/2022 83  % Final   Neutro Abs 11/05/2022 6.8  1.7 - 7.7 K/uL Final   Lymphocytes Relative 11/05/2022 7  % Final   Lymphs Abs 11/05/2022 0.6 (L)   0.7 - 4.0 K/uL Final   Monocytes Relative 11/05/2022 10  % Final   Monocytes Absolute 11/05/2022 0.8  0.1 - 1.0 K/uL Final   Eosinophils Relative 11/05/2022 0  % Final   Eosinophils Absolute 11/05/2022 0.0  0.0 - 0.5 K/uL Final   Basophils Relative 11/05/2022 0  % Final   Basophils Absolute 11/05/2022 0.0  0.0 - 0.1 K/uL Final   Immature Granulocytes 11/05/2022 0  % Final   Abs Immature Granulocytes 11/05/2022 0.03  0.00 - 0.07 K/uL Final   Performed at [facility], [place] [address], [place], [place] [address]   Magnesium 11/05/2022 1.4 (L)  1.7 - 2.4 mg/dL Final   Performed at [place] [address] [place], [place] [address]   B Natriuretic Peptide 11/05/2022 79.5  0.0 - 100.0 pg/mL Final   Performed at [place] [address], [place], [place] [address]   Fecal Occult Bld 11/05/2022 POSITIVE (A)  NEGATIVE Final   ABO/RH(D) 11/05/2022 O POS   Final   Antibody Screen 11/05/2022 NEG   Final   Sample Expiration 11/05/2022 11/08/2022,2359   Final   Unit Number 11/05/2022 [id number]   Final   Blood Component Type 11/05/2022 RBC LR PHER2   Final   Unit division 11/05/2022 00   Final   Status of Unit 11/05/2022 [facility]   Final   Transfusion Status 11/05/2022 OK TO TRANSFUSE   Final   Crossmatch Result 11/05/2022    Final                   Value:Compatible Performed at [facility], [place] [address] [place], [place] [address]    Vitamin B-12 11/05/2022 582  180 - 914 pg/mL Final   Comment: (NOTE) This assay is not validated for testing neonatal or myeloproliferative syndrome specimens for Vitamin B12 levels. Performed at [facility], [place] [address] [place], [place] [address]    Folate 11/05/2022 >40.0  >5.9 ng/mL Final   Comment: RESULT CONFIRMED BY MANUAL DILUTION Performed at [place] [address] [place],  [place] [address]    Iron 11/05/2022  33  28 - 170 ug/dL Final   TIBC 11/05/2022 307  250 - 450 ug/dL Final   Saturation Ratios 11/05/2022 11  10.4 - 31.8 % Final   UIBC 11/05/2022 274  ug/dL Final   Performed at [facility], [place] [address] [place], [place] [address]   Ferritin 11/05/2022 54  11 - 307 ng/mL Final   Performed at [facility], [place] [address] [place], [place] [address]   Retic Ct Pct 11/05/2022 1.3  0.4 - 3.1 % Final   RBC. 11/05/2022 3.08 (L)  3.87 - 5.11 MIL/uL Final   Retic Count, Absolute 11/05/2022 40.3  19.0 - 186.0 K/uL Final   Immature Retic Fract 11/05/2022 9.8  2.3 - 15.9 % Final   Performed at [facility], [place] [address] [place], [place] [address]   Lactic Acid, Venous 11/05/2022 1.1  0.5 - 1.9 mmol/L Final   Performed at [place] [address] [place], [place] [address]   Lactic Acid, Venous 11/06/2022 1.0  0.5 - 1.9 mmol/L Final   Performed at [place] [address], [place], [place] [address]   Total CK 11/06/2022 65  38 - 234 U/L Final   Performed at [facility], [place] [address], [place], [place] [address]   Phosphorus 11/06/2022 3.7  2.5 - 4.6 mg/dL Final   Performed at [facility], [place] [address], [place], [place] [address]   Osmolality 11/06/2022 286  275 - 295 mOsm/kg Final   Comment: REPEATED TO VERIFY Performed at [facility], [place] [place], [place] [address]    Prealbumin 11/06/2022 16 (L)  18 - 38 mg/dL Final   Performed at [place] [address] [place], [place] [address]   Prothrombin Time 11/06/2022 14.5  11.4 - 15.2 seconds Final   INR 11/06/2022 1.1  0.8 - 1.2 Final   Comment: (NOTE) INR goal varies based on device and disease states. Performed at [facility], [place] [address], [place], [place] [address]    Troponin I (High Sensitivity) 11/06/2022 18 (H)  <18 ng/L Final    Comment: (NOTE) Elevated high sensitivity troponin I (hsTnI) values and significant  changes across serial measurements may suggest ACS but many other  chronic and acute conditions are known to elevate hsTnI results.  Refer to the "Links" section for chest pain algorithms and additional  guidance. Performed at [facility], [place] [address], [place], [place] [address]    TSH 11/06/2022 1.773  0.350 - 4.500 uIU/mL Final   Comment: Performed by a 3rd Generation assay with a functional sensitivity of <=0.01 uIU/mL. Performed at [facility], [place] [address] [place], [place] [address]    pH, Ven 11/06/2022 7.37  7.25 - 7.43 Final   pCO2, Ven 11/06/2022 44  44 - 60 mmHg Final   pO2, Ven 11/06/2022 33  32 - 45 mmHg Final   Bicarbonate 11/06/2022 26.0  20.0 - 28.0 mmol/L Final   Acid-Base Excess 11/06/2022 0.3  0.0 - 2.0 mmol/L Final   O2 Saturation 11/06/2022 45.9  % Final   Patient temperature 11/06/2022 36.7   Final   Performed at [facility], [place] [address], [place], [place] [address]   Order Confirmation 11/05/2022    Final  Value:ORDER PROCESSED BY BLOOD BANK Performed at [facility], [place] [address] [place], [place] [address]    Sodium 11/06/2022 132 (L)  135 - 145 mmol/L Final   Potassium 11/06/2022 4.7  3.5 - 5.1 mmol/L Final   Chloride 11/06/2022 101  98 - 111 mmol/L Final   CO2 11/06/2022 22  22 - 32 mmol/L Final   Glucose, Bld 11/06/2022 96  70 - 99 mg/dL Final   Glucose reference range applies only to samples taken after fasting for at least 8 hours.   BUN 11/06/2022 25 (H)  8 - 23 mg/dL Final   Creatinine, Ser 11/06/2022 1.00  0.44 - 1.00 mg/dL Final   Calcium 11/06/2022 9.1  8.9 - 10.3 mg/dL Final   Total Protein 11/06/2022 6.1 (L)  6.5 - 8.1 g/dL Final   Albumin 11/06/2022 3.3 (L)  3.5 - 5.0 g/dL Final   AST 11/06/2022 18  15 - 41 U/L Final   ALT 11/06/2022 15  0 - 44 U/L Final    Alkaline Phosphatase 11/06/2022 78  38 - 126 U/L Final   Total Bilirubin 11/06/2022 0.9  0.3 - 1.2 mg/dL Final   GFR, Estimated 11/06/2022 54 (L)  >60 mL/min Final   Comment: (NOTE) Calculated using the CKD-EPI Creatinine Equation (2021)    Anion gap 11/06/2022 9  5 - 15 Final   Performed at [facility], [place] [address], [place], [place] [address]   WBC 11/06/2022 8.6  4.0 - 10.5 K/uL Final   RBC 11/06/2022 3.02 (L)  3.87 - 5.11 MIL/uL Final   Hemoglobin 11/06/2022 9.6 (L)  12.0 - 15.0 g/dL Final   Comment: REPEATED TO VERIFY POST TRANSFUSION SPECIMEN    HCT 11/06/2022 29.5 (L)  36.0 - 46.0 % Final   MCV 11/06/2022 97.7  80.0 - 100.0 fL Final   MCH 11/06/2022 31.8  26.0 - 34.0 pg Final   MCHC 11/06/2022 32.5  30.0 - 36.0 g/dL Final   RDW 11/06/2022 13.2  11.5 - 15.5 % Final   Platelets 11/06/2022 176  150 - 400 K/uL Final   nRBC 11/06/2022 0.0  0.0 - 0.2 % Final   Performed at [facility], [place] [address], [place], [place] [address]   Magnesium 11/06/2022 2.1  1.7 - 2.4 mg/dL Final   Performed at [place] [address] [place], [place] [address]   ISSUE DATE / TIME 11/05/2022 [id number]   Final   Blood Product Unit Number 11/05/2022 [id number]   Final   PRODUCT CODE 11/05/2022 [id number]   Final   Unit Type and Rh 11/05/2022 5100   Final   Blood Product Expiration Date 11/05/2022 [id number]   Final   SURGICAL PATHOLOGY 11/06/2022    Final-Edited                   Value:SURGICAL PATHOLOGY CASE: [id number] PATIENT: [facility] Surgical Pathology Report     Clinical History: Anemia, FOBT+ (crm)     FINAL MICROSCOPIC DIAGNOSIS:  A. STOMACH, BIOPSY: Antral and oxyntic mucosa with slight chronic inflammation. No Helicobacter pylori identified.   GROSS DESCRIPTION:  Received in formalin are tan, soft tissue fragments that are submitted in toto. Number: 6 size: 0.1 to 0.2 cm blocks: 1 (KW,  11/06/2022)    Final Diagnosis performed by Claudette Laws, MD.   Electronically signed 11/07/2022 Technical component performed at [facility], [place] [address], [place], [place] [address].  Professional component performed at  [place]. [facility], [place] [address] [place], [place] [address].  Immunohistochemistry Technical component (if applicable) was performed at [facility]. [address], [place], [place], [place] [address].   IMMUNOHISTOCHEMISTRY DISCLAIMER (if applicable): Some o                         f these immunohistochemical stains may have been developed and the performance characteristics determine by [facility]. Some may not have been cleared or approved by the U.S. Food and Drug Administration. The FDA has determined that such clearance or approval is not necessary. This test is used for clinical purposes. It should not be regarded as investigational or for research. This laboratory is certified under the [place] (CLIA-88) as qualified to perform high complexity clinical laboratory testing.  The controls stained appropriately.    WBC 11/07/2022 7.8  4.0 - 10.5 K/uL Final   RBC 11/07/2022 3.79 (L)  3.87 - 5.11 MIL/uL Final   Hemoglobin 11/07/2022 11.8 (L)  12.0 - 15.0 g/dL Final   HCT 11/07/2022 36.7  36.0 - 46.0 % Final   MCV 11/07/2022 96.8  80.0 - 100.0 fL Final   MCH 11/07/2022 31.1  26.0 - 34.0 pg Final   MCHC 11/07/2022 32.2  30.0 - 36.0 g/dL Final   RDW 11/07/2022 13.7  11.5 - 15.5 % Final   Platelets 11/07/2022 165  150 - 400 K/uL Final   nRBC 11/07/2022 0.0  0.0 - 0.2 % Final   Performed at [facility], [place] [address], [place], [place] [address]   Sodium 11/07/2022 137  135 - 145 mmol/L Final   Potassium 11/07/2022 4.3  3.5 - 5.1 mmol/L Final   Chloride 11/07/2022 107  98 - 111 mmol/L Final   CO2 11/07/2022 21 (L)  22 - 32 mmol/L  Final   Glucose, Bld 11/07/2022 102 (H)  70 - 99 mg/dL Final   Glucose reference range applies only to samples taken after fasting for at least 8 hours.   BUN 11/07/2022 13  8 - 23 mg/dL Final   Creatinine, Ser 11/07/2022 0.76  0.44 - 1.00 mg/dL Final   Calcium 11/07/2022 9.4  8.9 - 10.3 mg/dL Final   GFR, Estimated 11/07/2022 >60  >60 mL/min Final   Comment: (NOTE) Calculated using the CKD-EPI Creatinine Equation (2021)    Anion gap 11/07/2022 9  5 - 15 Final   Performed at [facility], [place] [address] [place], [place] [address]     X-Rays:DG Pelvis Portable  Result Date: 12/27/2022 CLINICAL DATA:  Status post total right hip arthroplasty. EXAM: PORTABLE PELVIS 1-2 VIEWS COMPARISON:  CT abdomen and pelvis 08/30/2022 FINDINGS: Interval total right hip arthroplasty. No perihardware lucency is seen to indicate hardware failure or loosening. Mild bilateral sacroiliac subchondral sclerosis. Mild superior left femoroacetabular joint space narrowing. Mild pubic symphysis joint space narrowing and peripheral osteophytosis. No acute fracture is seen. No dislocation. Postoperative changes of lateral right hip soft tissue swelling and subcutaneous air. Partial visualization of lumbosacral posterior fusion hardware. IMPRESSION: Interval total right hip arthroplasty without evidence of hardware failure. Electronically Signed   By: Yvonne Kendall M.D.   On: 12/27/2022 16:22   DG HIP UNILAT WITH PELVIS 1V RIGHT  Result Date: 12/27/2022 CLINICAL DATA:  Right hip arthroplasty EXAM: DG HIP (WITH OR WITHOUT PELVIS) 1V RIGHT COMPARISON:  None Available. FINDINGS: Eight fluoroscopic images are obtained during the  performance of the procedure and are provided for interpretation only. Right hip arthroplasty is identified in the expected position without evidence of acute complication. Please refer to the operative report. Fluoroscopy time: 6 seconds, 0.7584 mGy IMPRESSION: 1. Intraoperative  evaluation during right hip arthroplasty. Please refer to operative report. Electronically Signed   By: Randa Ngo M.D.   On: 12/27/2022 15:25   DG C-Arm 1-60 Min-No Report  Result Date: 12/27/2022 Fluoroscopy was utilized by the requesting physician.  No radiographic interpretation.   DG C-Arm 1-60 Min-No Report  Result Date: 12/27/2022 Fluoroscopy was utilized by the requesting physician.  No radiographic interpretation.   CT Chest Wo Contrast  Result Date: 12/13/2022 CLINICAL DATA:  Follow-up pulmonary nodules EXAM: CT CHEST WITHOUT CONTRAST TECHNIQUE: Multidetector CT imaging of the chest was performed following the standard protocol without IV contrast. RADIATION DOSE REDUCTION: This exam was performed according to the departmental dose-optimization program which includes automated exposure control, adjustment of the mA and/or kV according to patient size and/or use of iterative reconstruction technique. COMPARISON:  08/30/2022 FINDINGS: Cardiovascular: Somewhat limited due to lack of IV contrast. Atherosclerotic calcifications of the thoracic aorta are noted. Changes of prior TAVR procedure are seen. Heart is not significantly enlarged. No significant coronary calcifications are seen. Mediastinum/Nodes: Thoracic inlet is within normal limits. No hilar or mediastinal adenopathy is noted. Postsurgical clips in the right axilla are noted consistent with the given clinical history of breast carcinoma. The esophagus as visualized is within normal limits. Lungs/Pleura: Lungs are well aerated bilaterally. Stable 3 mm nodule in the right lower lobe is noted best seen on image number 98 of series 8. No other right-sided nodules are seen. The posterior left upper lobe nodule seen previously is again noted and slightly more prominent now measuring approximately 11 mm in greatest dimension. Indistinct margins are again identified and a slight sub solid nature is seen. No other parenchymal nodules are seen.  No infiltrate or sizable effusion is noted. Upper Abdomen: Visualized upper abdomen is unremarkable. Musculoskeletal: Degenerative changes of the thoracic spine are seen. No acute rib abnormality is noted. IMPRESSION: Nodules are again noted bilaterally. The 3 mm right lower lobe nodule is stable. The left lower lobe nodule has increased slightly in size now measuring 11 mm in greatest dimension. Recommend referral to pulmonary medicine or thoracic surgery for further management. No other focal abnormality is noted. Aortic Atherosclerosis (ICD10-I70.0). Electronically Signed   By: Inez Catalina M.D.   On: 12/13/2022 20:41    EKG: Orders placed or performed during the hospital encounter of 11/05/22   EKG 12-Lead   EKG 12-Lead   EKG 12-Lead   EKG 12-Lead     Hospital Course: Barbara Russell is a 89 y.o. who was admitted to [facility]. They were brought to the operating room on 12/27/2022 and underwent Procedure(s): TOTAL HIP ARTHROPLASTY ANTERIOR APPROACH.  Patient tolerated the procedure well and was later transferred to the recovery room and then to the orthopaedic floor for postoperative care. They were given PO and IV analgesics for pain control following their surgery. They were given 24 hours of postoperative antibiotics of  Anti-infectives (From admission, onward)    Start     Dose/Rate Route Frequency Ordered Stop   12/27/22 1130  ceFAZolin (ANCEF) IVPB 2g/100 mL premix        2 g 200 mL/hr over 30 Minutes Intravenous On call to O.R. 12/27/22 1116 12/27/22 1412      and started on DVT  prophylaxis in the form of Xarelto.   PT and OT were ordered for total joint protocol. Discharge planning consulted to help with post-op disposition and equipment needs. Patient had a good night on the evening of surgery. They started to get up OOB with physical therapy on POD #1. Continued to work with physical therapy into POD #2. Patient was seen during rounds on day two and was ready to go home  pending progress with physical therapy. Patient worked with physical therapy for a total of 3 sessions and was meeting their goals. Dressing was changed and the incision was C/D/I.  They were discharged home later that day in stable condition.  Diet: Cardiac diet Activity: WBAT Follow-up: in 2 weeks Disposition:  Home Discharged Condition: stable   Discharge Instructions     Call MD / Call 911   Complete by: As directed    If you experience chest pain or shortness of breath, CALL 911 and be transported to the hospital emergency room.  If you develope a fever above 101 F, pus (white drainage) or increased drainage or redness at the wound, or calf pain, call your surgeon's office.   Change dressing   Complete by: As directed    You have an adhesive waterproof bandage over the incision. Leave this in place until your first follow-up appointment. Once you remove this you will not need to place another bandage.   Constipation Prevention   Complete by: As directed    Drink plenty of fluids.  Prune juice may be helpful.  You may use a stool softener, such as Colace (over the counter) 100 mg twice a day.  Use MiraLax (over the counter) for constipation as needed.   Diet - low sodium heart healthy   Complete by: As directed    Do not sit on low chairs, stoools or toilet seats, as it may be difficult to get up from low surfaces   Complete by: As directed    Driving restrictions   Complete by: As directed    No driving for two weeks   Post-operative opioid taper instructions:   Complete by: As directed    POST-OPERATIVE OPIOID TAPER INSTRUCTIONS: It is important to wean off of your opioid medication as soon as possible. If you do not need pain medication after your surgery it is ok to stop day one. Opioids include: Codeine, Hydrocodone(Norco, Vicodin), Oxycodone(Percocet, oxycontin) and hydromorphone amongst others.  Long term and even short term use of opiods can cause: Increased pain  response Dependence Constipation Depression Respiratory depression And more.  Withdrawal symptoms can include Flu like symptoms Nausea, vomiting And more Techniques to manage these symptoms Hydrate well Eat regular healthy meals Stay active Use relaxation techniques(deep breathing, meditating, yoga) Do Not substitute Alcohol to help with tapering If you have been on opioids for less than two weeks and do not have pain than it is ok to stop all together.  Plan to wean off of opioids This plan should start within one week post op of your joint replacement. Maintain the same interval or time between taking each dose and first decrease the dose.  Cut the total daily intake of opioids by one tablet each day Next start to increase the time between doses. The last dose that should be eliminated is the evening dose.      TED hose   Complete by: As directed    Use stockings (TED hose) for three weeks on both leg(s).  You may remove them  at night for sleeping.   Weight bearing as tolerated   Complete by: As directed       Allergies as of 12/29/2022       Reactions   Amlodipine Swelling   LEE with '5mg'$  dose Foot swelling   Benadryl [diphenhydramine Hcl (sleep)] Other (See Comments)   Hyperstimulation   Broccoli Flavor [flavoring Agent] Diarrhea   Brussels Sprouts [brassica Oleracea] Diarrhea   Cabbage Diarrhea   Ciprofloxacin Hives   Collard Greens [wild Lettuce Extract (lactuca Virosa)] Diarrhea   Gluten Meal    Lactose Intolerance (gi) Diarrhea   Macrobid [nitrofurantoin Monohyd Macro] Nausea And Vomiting   Naproxen Other (See Comments)   Stomach pain   Quinine Sulfate [quinine] Hives   Rosuvastatin    headache   Sulfa Antibiotics Hives      Tramadol Other (See Comments)   unknown to patient   Penicillins Rash   Has patient had a PCN reaction causing immediate rash, facial/tongue/throat swelling, SOB or lightheadedness with hypotension: Yes Has patient had a PCN  reaction causing severe rash involving mucus membranes or skin necrosis: No Has patient had a PCN reaction that required hospitalization: No Has patient had a PCN reaction occurring within the last 10 years: No If all of the above answers are "NO", then may proceed with Cephalosporin use. Tolerated Cephalosporin Date: 05/19/22.   Prednisone Other (See Comments)   Weakness and pain in joints        Medication List     STOP taking these medications    aspirin EC 81 MG tablet   b complex vitamins capsule   cholecalciferol 25 MCG (1000 UNIT) tablet Commonly known as: VITAMIN D3   PreserVision AREDS 2 Caps       TAKE these medications    acetaminophen 325 MG tablet Commonly known as: TYLENOL Take 650 mg by mouth every 6 (six) hours as needed for moderate pain or mild pain.   cetirizine 10 MG tablet Commonly known as: ZYRTEC Take 10 mg by mouth daily as needed for allergies or rhinitis.   CRANBERRY PO Take 1 tablet by mouth at bedtime.   furosemide 20 MG tablet Commonly known as: LASIX Take 20 mg by mouth daily.   gabapentin 300 MG capsule Commonly known as: NEURONTIN Take 600 mg by mouth at bedtime.   HYDROcodone-acetaminophen 5-325 MG tablet Commonly known as: NORCO/VICODIN Take 1-2 tablets by mouth every 6 (six) hours as needed for severe pain.   isosorbide mononitrate 30 MG 24 hr tablet Commonly known as: IMDUR TAKE 1 TABLET BY MOUTH EVERY DAY   losartan 50 MG tablet Commonly known as: COZAAR Take 1 tablet (50 mg total) by mouth 2 (two) times daily. What changed:  when to take this additional instructions   methocarbamol 500 MG tablet Commonly known as: ROBAXIN Take 1 tablet (500 mg total) by mouth every 6 (six) hours as needed for muscle spasms.   nitroGLYCERIN 0.4 MG SL tablet Commonly known as: NITROSTAT PLACE 1 TABLET UNDER THE TONGUE EVERY 5 MINUTES AS NEEDED FOR CHEST PAIN.   ondansetron 4 MG tablet Commonly known as: ZOFRAN Take 4 mg by  mouth every 4 (four) hours as needed for vomiting or nausea.   OneTouch Ultra test strip Generic drug: glucose blood CHECK ONCE DAILY AS DIRECTED   rivaroxaban 10 MG Tabs tablet Commonly known as: XARELTO Take 1 tablet (10 mg total) by mouth daily with breakfast for 20 days. Then resume an 81 mg aspirin once a day.  saccharomyces boulardii 250 MG capsule Commonly known as: FLORASTOR Take 500 mg by mouth daily in the afternoon.   Salonpas 3.12-16-08 % Ptch Generic drug: Camphor-Menthol-Methyl Sal Place 1 patch onto the skin daily as needed (pain).   spironolactone 25 MG tablet Commonly known as: ALDACTONE Take 1 tablet (25 mg total) by mouth daily.   SYSTANE OP Place 1 drop into both eyes in the morning and at bedtime.   zolpidem 5 MG tablet Commonly known as: AMBIEN Take 7.5 mg by mouth at bedtime.               Discharge Care Instructions  (From admission, onward)           Start     Ordered   12/28/22 0000  Weight bearing as tolerated        12/28/22 0717   12/28/22 0000  Change dressing       Comments: You have an adhesive waterproof bandage over the incision. Leave this in place until your first follow-up appointment. Once you remove this you will not need to place another bandage.   12/28/22 7591            Follow-up Information     Gaynelle Arabian, MD. Go on 01/09/2023.   Specialty: Orthopedic Surgery Why: You are scheduled for first post op appt on Tuesday January 30 at 2:00pm. Contact information: [address] [place] [address] [place] [address] [phone]         Care, [facility] Follow up.   Specialty: Home Health Services Why: Alvis Lemmings will provide PT in the home after discharge. Contact information: [address] [place] [address] [phone]                 Signed: R. Jaynie Bream, PA-C Orthopedic Surgery 12/30/2022, 7:57 PM

## 2022-12-31 DIAGNOSIS — E7800 Pure hypercholesterolemia, unspecified: Secondary | ICD-10-CM | POA: Diagnosis not present

## 2022-12-31 DIAGNOSIS — I2510 Atherosclerotic heart disease of native coronary artery without angina pectoris: Secondary | ICD-10-CM | POA: Diagnosis not present

## 2022-12-31 DIAGNOSIS — M1990 Unspecified osteoarthritis, unspecified site: Secondary | ICD-10-CM | POA: Diagnosis not present

## 2022-12-31 DIAGNOSIS — K2970 Gastritis, unspecified, without bleeding: Secondary | ICD-10-CM | POA: Diagnosis not present

## 2022-12-31 DIAGNOSIS — I081 Rheumatic disorders of both mitral and tricuspid valves: Secondary | ICD-10-CM | POA: Diagnosis not present

## 2022-12-31 DIAGNOSIS — Z471 Aftercare following joint replacement surgery: Secondary | ICD-10-CM | POA: Diagnosis not present

## 2022-12-31 DIAGNOSIS — M810 Age-related osteoporosis without current pathological fracture: Secondary | ICD-10-CM | POA: Diagnosis not present

## 2022-12-31 DIAGNOSIS — G4700 Insomnia, unspecified: Secondary | ICD-10-CM | POA: Diagnosis not present

## 2022-12-31 DIAGNOSIS — Z87440 Personal history of urinary (tract) infections: Secondary | ICD-10-CM | POA: Diagnosis not present

## 2022-12-31 DIAGNOSIS — I700 Atherosclerosis of aorta: Secondary | ICD-10-CM | POA: Diagnosis not present

## 2022-12-31 DIAGNOSIS — I5032 Chronic diastolic (congestive) heart failure: Secondary | ICD-10-CM | POA: Diagnosis not present

## 2022-12-31 DIAGNOSIS — D631 Anemia in chronic kidney disease: Secondary | ICD-10-CM | POA: Diagnosis not present

## 2022-12-31 DIAGNOSIS — I252 Old myocardial infarction: Secondary | ICD-10-CM | POA: Diagnosis not present

## 2022-12-31 DIAGNOSIS — I2583 Coronary atherosclerosis due to lipid rich plaque: Secondary | ICD-10-CM | POA: Diagnosis not present

## 2022-12-31 DIAGNOSIS — I130 Hypertensive heart and chronic kidney disease with heart failure and stage 1 through stage 4 chronic kidney disease, or unspecified chronic kidney disease: Secondary | ICD-10-CM | POA: Diagnosis not present

## 2022-12-31 DIAGNOSIS — R918 Other nonspecific abnormal finding of lung field: Secondary | ICD-10-CM | POA: Diagnosis not present

## 2022-12-31 DIAGNOSIS — M545 Low back pain, unspecified: Secondary | ICD-10-CM | POA: Diagnosis not present

## 2022-12-31 DIAGNOSIS — Z853 Personal history of malignant neoplasm of breast: Secondary | ICD-10-CM | POA: Diagnosis not present

## 2022-12-31 DIAGNOSIS — L900 Lichen sclerosus et atrophicus: Secondary | ICD-10-CM | POA: Diagnosis not present

## 2022-12-31 DIAGNOSIS — K219 Gastro-esophageal reflux disease without esophagitis: Secondary | ICD-10-CM | POA: Diagnosis not present

## 2022-12-31 DIAGNOSIS — H3530 Unspecified macular degeneration: Secondary | ICD-10-CM | POA: Diagnosis not present

## 2022-12-31 DIAGNOSIS — E871 Hypo-osmolality and hyponatremia: Secondary | ICD-10-CM | POA: Diagnosis not present

## 2022-12-31 DIAGNOSIS — N1831 Chronic kidney disease, stage 3a: Secondary | ICD-10-CM | POA: Diagnosis not present

## 2022-12-31 DIAGNOSIS — Z952 Presence of prosthetic heart valve: Secondary | ICD-10-CM | POA: Diagnosis not present

## 2023-01-04 DIAGNOSIS — N1831 Chronic kidney disease, stage 3a: Secondary | ICD-10-CM | POA: Diagnosis not present

## 2023-01-04 DIAGNOSIS — I130 Hypertensive heart and chronic kidney disease with heart failure and stage 1 through stage 4 chronic kidney disease, or unspecified chronic kidney disease: Secondary | ICD-10-CM | POA: Diagnosis not present

## 2023-01-04 DIAGNOSIS — Z471 Aftercare following joint replacement surgery: Secondary | ICD-10-CM | POA: Diagnosis not present

## 2023-01-04 DIAGNOSIS — D631 Anemia in chronic kidney disease: Secondary | ICD-10-CM | POA: Diagnosis not present

## 2023-01-04 DIAGNOSIS — I5032 Chronic diastolic (congestive) heart failure: Secondary | ICD-10-CM | POA: Diagnosis not present

## 2023-01-04 DIAGNOSIS — I2510 Atherosclerotic heart disease of native coronary artery without angina pectoris: Secondary | ICD-10-CM | POA: Diagnosis not present

## 2023-01-08 DIAGNOSIS — I2510 Atherosclerotic heart disease of native coronary artery without angina pectoris: Secondary | ICD-10-CM | POA: Diagnosis not present

## 2023-01-08 DIAGNOSIS — Z471 Aftercare following joint replacement surgery: Secondary | ICD-10-CM | POA: Diagnosis not present

## 2023-01-08 DIAGNOSIS — N1831 Chronic kidney disease, stage 3a: Secondary | ICD-10-CM | POA: Diagnosis not present

## 2023-01-08 DIAGNOSIS — D631 Anemia in chronic kidney disease: Secondary | ICD-10-CM | POA: Diagnosis not present

## 2023-01-08 DIAGNOSIS — I130 Hypertensive heart and chronic kidney disease with heart failure and stage 1 through stage 4 chronic kidney disease, or unspecified chronic kidney disease: Secondary | ICD-10-CM | POA: Diagnosis not present

## 2023-01-08 DIAGNOSIS — I5032 Chronic diastolic (congestive) heart failure: Secondary | ICD-10-CM | POA: Diagnosis not present

## 2023-01-11 DIAGNOSIS — Z471 Aftercare following joint replacement surgery: Secondary | ICD-10-CM | POA: Diagnosis not present

## 2023-01-11 DIAGNOSIS — N1831 Chronic kidney disease, stage 3a: Secondary | ICD-10-CM | POA: Diagnosis not present

## 2023-01-11 DIAGNOSIS — D631 Anemia in chronic kidney disease: Secondary | ICD-10-CM | POA: Diagnosis not present

## 2023-01-11 DIAGNOSIS — I5032 Chronic diastolic (congestive) heart failure: Secondary | ICD-10-CM | POA: Diagnosis not present

## 2023-01-11 DIAGNOSIS — I2510 Atherosclerotic heart disease of native coronary artery without angina pectoris: Secondary | ICD-10-CM | POA: Diagnosis not present

## 2023-01-11 DIAGNOSIS — I130 Hypertensive heart and chronic kidney disease with heart failure and stage 1 through stage 4 chronic kidney disease, or unspecified chronic kidney disease: Secondary | ICD-10-CM | POA: Diagnosis not present

## 2023-01-15 DIAGNOSIS — Z471 Aftercare following joint replacement surgery: Secondary | ICD-10-CM | POA: Diagnosis not present

## 2023-01-15 DIAGNOSIS — N1831 Chronic kidney disease, stage 3a: Secondary | ICD-10-CM | POA: Diagnosis not present

## 2023-01-15 DIAGNOSIS — I130 Hypertensive heart and chronic kidney disease with heart failure and stage 1 through stage 4 chronic kidney disease, or unspecified chronic kidney disease: Secondary | ICD-10-CM | POA: Diagnosis not present

## 2023-01-15 DIAGNOSIS — I2510 Atherosclerotic heart disease of native coronary artery without angina pectoris: Secondary | ICD-10-CM | POA: Diagnosis not present

## 2023-01-15 DIAGNOSIS — D631 Anemia in chronic kidney disease: Secondary | ICD-10-CM | POA: Diagnosis not present

## 2023-01-15 DIAGNOSIS — I5032 Chronic diastolic (congestive) heart failure: Secondary | ICD-10-CM | POA: Diagnosis not present

## 2023-01-18 DIAGNOSIS — I130 Hypertensive heart and chronic kidney disease with heart failure and stage 1 through stage 4 chronic kidney disease, or unspecified chronic kidney disease: Secondary | ICD-10-CM | POA: Diagnosis not present

## 2023-01-18 DIAGNOSIS — N1831 Chronic kidney disease, stage 3a: Secondary | ICD-10-CM | POA: Diagnosis not present

## 2023-01-18 DIAGNOSIS — D631 Anemia in chronic kidney disease: Secondary | ICD-10-CM | POA: Diagnosis not present

## 2023-01-18 DIAGNOSIS — I5032 Chronic diastolic (congestive) heart failure: Secondary | ICD-10-CM | POA: Diagnosis not present

## 2023-01-18 DIAGNOSIS — Z471 Aftercare following joint replacement surgery: Secondary | ICD-10-CM | POA: Diagnosis not present

## 2023-01-18 DIAGNOSIS — I2510 Atherosclerotic heart disease of native coronary artery without angina pectoris: Secondary | ICD-10-CM | POA: Diagnosis not present

## 2023-01-20 ENCOUNTER — Other Ambulatory Visit: Payer: Self-pay | Admitting: Cardiology

## 2023-01-22 ENCOUNTER — Telehealth: Payer: Self-pay | Admitting: Pharmacist

## 2023-01-22 DIAGNOSIS — D631 Anemia in chronic kidney disease: Secondary | ICD-10-CM | POA: Diagnosis not present

## 2023-01-22 DIAGNOSIS — I130 Hypertensive heart and chronic kidney disease with heart failure and stage 1 through stage 4 chronic kidney disease, or unspecified chronic kidney disease: Secondary | ICD-10-CM | POA: Diagnosis not present

## 2023-01-22 DIAGNOSIS — N1831 Chronic kidney disease, stage 3a: Secondary | ICD-10-CM | POA: Diagnosis not present

## 2023-01-22 DIAGNOSIS — I5032 Chronic diastolic (congestive) heart failure: Secondary | ICD-10-CM | POA: Diagnosis not present

## 2023-01-22 DIAGNOSIS — Z471 Aftercare following joint replacement surgery: Secondary | ICD-10-CM | POA: Diagnosis not present

## 2023-01-22 DIAGNOSIS — I2510 Atherosclerotic heart disease of native coronary artery without angina pectoris: Secondary | ICD-10-CM | POA: Diagnosis not present

## 2023-01-22 NOTE — Telephone Encounter (Signed)
Patient called to ask if she could take her isosorbide in the AM. Advised this was ok. Also asked about rechecking her CBC. Concerned about after surgery. Advised it did drop (normal) after her hip replacement but it was stable at d/c. Advised she call her GI Dr. About checking since they were the one managing this prior to her Hip replacement.

## 2023-01-29 DIAGNOSIS — D631 Anemia in chronic kidney disease: Secondary | ICD-10-CM | POA: Diagnosis not present

## 2023-01-29 DIAGNOSIS — I5032 Chronic diastolic (congestive) heart failure: Secondary | ICD-10-CM | POA: Diagnosis not present

## 2023-01-29 DIAGNOSIS — N1831 Chronic kidney disease, stage 3a: Secondary | ICD-10-CM | POA: Diagnosis not present

## 2023-01-29 DIAGNOSIS — I2510 Atherosclerotic heart disease of native coronary artery without angina pectoris: Secondary | ICD-10-CM | POA: Diagnosis not present

## 2023-01-29 DIAGNOSIS — Z471 Aftercare following joint replacement surgery: Secondary | ICD-10-CM | POA: Diagnosis not present

## 2023-01-29 DIAGNOSIS — I130 Hypertensive heart and chronic kidney disease with heart failure and stage 1 through stage 4 chronic kidney disease, or unspecified chronic kidney disease: Secondary | ICD-10-CM | POA: Diagnosis not present

## 2023-01-30 DIAGNOSIS — M1990 Unspecified osteoarthritis, unspecified site: Secondary | ICD-10-CM | POA: Diagnosis not present

## 2023-01-30 DIAGNOSIS — K2970 Gastritis, unspecified, without bleeding: Secondary | ICD-10-CM | POA: Diagnosis not present

## 2023-01-30 DIAGNOSIS — Z5189 Encounter for other specified aftercare: Secondary | ICD-10-CM | POA: Diagnosis not present

## 2023-01-30 DIAGNOSIS — I2583 Coronary atherosclerosis due to lipid rich plaque: Secondary | ICD-10-CM | POA: Diagnosis not present

## 2023-01-30 DIAGNOSIS — I081 Rheumatic disorders of both mitral and tricuspid valves: Secondary | ICD-10-CM | POA: Diagnosis not present

## 2023-01-30 DIAGNOSIS — M810 Age-related osteoporosis without current pathological fracture: Secondary | ICD-10-CM | POA: Diagnosis not present

## 2023-01-30 DIAGNOSIS — D631 Anemia in chronic kidney disease: Secondary | ICD-10-CM | POA: Diagnosis not present

## 2023-01-30 DIAGNOSIS — I2510 Atherosclerotic heart disease of native coronary artery without angina pectoris: Secondary | ICD-10-CM | POA: Diagnosis not present

## 2023-01-30 DIAGNOSIS — H3530 Unspecified macular degeneration: Secondary | ICD-10-CM | POA: Diagnosis not present

## 2023-01-30 DIAGNOSIS — I700 Atherosclerosis of aorta: Secondary | ICD-10-CM | POA: Diagnosis not present

## 2023-01-30 DIAGNOSIS — Z471 Aftercare following joint replacement surgery: Secondary | ICD-10-CM | POA: Diagnosis not present

## 2023-01-30 DIAGNOSIS — L900 Lichen sclerosus et atrophicus: Secondary | ICD-10-CM | POA: Diagnosis not present

## 2023-01-30 DIAGNOSIS — K219 Gastro-esophageal reflux disease without esophagitis: Secondary | ICD-10-CM | POA: Diagnosis not present

## 2023-01-30 DIAGNOSIS — I252 Old myocardial infarction: Secondary | ICD-10-CM | POA: Diagnosis not present

## 2023-01-30 DIAGNOSIS — I130 Hypertensive heart and chronic kidney disease with heart failure and stage 1 through stage 4 chronic kidney disease, or unspecified chronic kidney disease: Secondary | ICD-10-CM | POA: Diagnosis not present

## 2023-01-30 DIAGNOSIS — E871 Hypo-osmolality and hyponatremia: Secondary | ICD-10-CM | POA: Diagnosis not present

## 2023-01-30 DIAGNOSIS — R918 Other nonspecific abnormal finding of lung field: Secondary | ICD-10-CM | POA: Diagnosis not present

## 2023-01-30 DIAGNOSIS — E7800 Pure hypercholesterolemia, unspecified: Secondary | ICD-10-CM | POA: Diagnosis not present

## 2023-01-30 DIAGNOSIS — I5032 Chronic diastolic (congestive) heart failure: Secondary | ICD-10-CM | POA: Diagnosis not present

## 2023-01-30 DIAGNOSIS — Z853 Personal history of malignant neoplasm of breast: Secondary | ICD-10-CM | POA: Diagnosis not present

## 2023-01-30 DIAGNOSIS — Z87440 Personal history of urinary (tract) infections: Secondary | ICD-10-CM | POA: Diagnosis not present

## 2023-01-30 DIAGNOSIS — Z952 Presence of prosthetic heart valve: Secondary | ICD-10-CM | POA: Diagnosis not present

## 2023-01-30 DIAGNOSIS — G4700 Insomnia, unspecified: Secondary | ICD-10-CM | POA: Diagnosis not present

## 2023-01-30 DIAGNOSIS — N1831 Chronic kidney disease, stage 3a: Secondary | ICD-10-CM | POA: Diagnosis not present

## 2023-01-30 DIAGNOSIS — M545 Low back pain, unspecified: Secondary | ICD-10-CM | POA: Diagnosis not present

## 2023-02-05 DIAGNOSIS — N1831 Chronic kidney disease, stage 3a: Secondary | ICD-10-CM | POA: Diagnosis not present

## 2023-02-05 DIAGNOSIS — I130 Hypertensive heart and chronic kidney disease with heart failure and stage 1 through stage 4 chronic kidney disease, or unspecified chronic kidney disease: Secondary | ICD-10-CM | POA: Diagnosis not present

## 2023-02-05 DIAGNOSIS — D631 Anemia in chronic kidney disease: Secondary | ICD-10-CM | POA: Diagnosis not present

## 2023-02-05 DIAGNOSIS — I2510 Atherosclerotic heart disease of native coronary artery without angina pectoris: Secondary | ICD-10-CM | POA: Diagnosis not present

## 2023-02-05 DIAGNOSIS — I5032 Chronic diastolic (congestive) heart failure: Secondary | ICD-10-CM | POA: Diagnosis not present

## 2023-02-05 DIAGNOSIS — Z471 Aftercare following joint replacement surgery: Secondary | ICD-10-CM | POA: Diagnosis not present

## 2023-02-12 DIAGNOSIS — N1831 Chronic kidney disease, stage 3a: Secondary | ICD-10-CM | POA: Diagnosis not present

## 2023-02-12 DIAGNOSIS — I2510 Atherosclerotic heart disease of native coronary artery without angina pectoris: Secondary | ICD-10-CM | POA: Diagnosis not present

## 2023-02-12 DIAGNOSIS — Z471 Aftercare following joint replacement surgery: Secondary | ICD-10-CM | POA: Diagnosis not present

## 2023-02-12 DIAGNOSIS — I5032 Chronic diastolic (congestive) heart failure: Secondary | ICD-10-CM | POA: Diagnosis not present

## 2023-02-12 DIAGNOSIS — D631 Anemia in chronic kidney disease: Secondary | ICD-10-CM | POA: Diagnosis not present

## 2023-02-12 DIAGNOSIS — I130 Hypertensive heart and chronic kidney disease with heart failure and stage 1 through stage 4 chronic kidney disease, or unspecified chronic kidney disease: Secondary | ICD-10-CM | POA: Diagnosis not present

## 2023-02-14 ENCOUNTER — Telehealth: Payer: Self-pay

## 2023-02-14 NOTE — Patient Outreach (Signed)
  Care Coordination   02/14/2023 Name: Barbara Russell MRN: [id number] DOB: [birth year]   Care Coordination Outreach Attempts:  An unsuccessful telephone outreach was attempted today to offer the patient information about available care coordination services as a benefit of their health plan.   Follow Up Plan:  Additional outreach attempts will be made to offer the patient care coordination information and services.   Encounter Outcome:  No Answer   Care Coordination Interventions:  No, not indicated   Peter Garter RN, BSN,CCM, CDE Care Management Coordinator [place] [facility] [phone]

## 2023-02-15 DIAGNOSIS — D649 Anemia, unspecified: Secondary | ICD-10-CM | POA: Diagnosis not present

## 2023-02-15 DIAGNOSIS — R197 Diarrhea, unspecified: Secondary | ICD-10-CM | POA: Diagnosis not present

## 2023-02-18 NOTE — Progress Notes (Unsigned)
Synopsis: Referred in March 2024 for pulmonary nodule by Seward Carol, MD  Subjective:   PATIENT ID: Barbara Russell GENDER: female DOB: 12/20/1929, MRN: [id number]  No chief complaint on file.   This is a 89 year old female, past medical history of breast cancer in 1999, chronic kidney disease, hypercholesterolemia, hypertension, history of MI, history of TAVR.  For severe aortic stenosis.Patient was referred after having CT imaging of the chest completed in January 2024.  Patient has a slow-growing left lower lobe pulmonary nodule that is now 11 mm in greatest diameter.  He was recommended by radiology for further evaluation by pulmonary.    ***  Past Medical History:  Diagnosis Date   Anemia    Breast cancer ([place])    Hx of BC, s/p mastectomy on right 1999, s/p tamoxifen X1 year   Chest pain    EC Stress test 12/21/11 Low risk, no ischemia, normal EF   Chronic kidney disease    Coronary artery disease    Remote Hx of CAD, normal stress test 2009   Diverticulitis    GERD (gastroesophageal reflux disease)    No longer a problem   Hypercholesteremia    Hypertension    Insomnia    Lichen sclerosus    Low back pain    Macular degeneration    Myocardial infarction ([place]) 12/11/1998   Osteoarthrosis, unspecified whether generalized or localized, unspecified site    Pain    Several year Hx of pain/ SOB radiating to her neck   S/P TAVR (transcatheter aortic valve replacement) 10/10/2022   s/p TAVR with a 79m Edwards S3UR via the TF approach by Dr. MAngelena Form& Dr. ETenny Craw  Severe aortic stenosis    UTI (urinary tract infection)      Family History  Problem Relation Age of Onset   Cancer Mother        cervical   Heart disease Father      Past Surgical History:  Procedure Laterality Date   ABDOMINAL HYSTERECTOMY     APPENDECTOMY     BIOPSY  11/06/2022   Procedure: BIOPSY;  Surgeon: VLoney Laurence DO;  Location: [facility];  Service: Gastroenterology;;   BREAST  BIOPSY Left 11/14/2022   [place] BREAST BX W LOC DEV 1ST LESION IMG BX [place] 11/14/2022 [facility]   ESOPHAGOGASTRODUODENOSCOPY N/A 11/06/2022   Procedure: ESOPHAGOGASTRODUODENOSCOPY (EGD);  Surgeon: VLoney Laurence DO;  [facility];  Service: Gastroenterology;  Laterality: N/A;   HAND SURGERY Bilateral    CTS and trigger finger on right and left   INTRAOPERATIVE TRANSTHORACIC ECHOCARDIOGRAM N/A 10/10/2022   Procedure: INTRAOPERATIVE TRANSTHORACIC ECHOCARDIOGRAM;  Surgeon: MBurnell Blanks MD;  [place] [facility]  Service: Open Heart Surgery;  Laterality: N/A;   MASTECTOMY Right 12/11/1997   REPLACEMENT TOTAL KNEE Right 11/10/2009   RIGHT HEART CATH AND CORONARY ANGIOGRAPHY N/A 08/17/2022   Procedure: RIGHT HEART CATH AND CORONARY ANGIOGRAPHY;  Surgeon: MBurnell Blanks MD;  [facility];  Service: Cardiovascular;  Laterality: N/A;   TOTAL HIP ARTHROPLASTY Right 12/27/2022   Procedure: TOTAL HIP ARTHROPLASTY ANTERIOR APPROACH;  Surgeon: AGaynelle Arabian MD;  [facility];  Service: Orthopedics;  Laterality: Right;   TOTAL KNEE REVISION Right 05/17/2022   Procedure: Right knee polyethylene revision;  Surgeon: AGaynelle Arabian MD;  Location: [facility];  Service: Orthopedics;  Laterality: Right;   TRANSCATHETER AORTIC VALVE REPLACEMENT, TRANSFEMORAL N/A 10/10/2022   Procedure: Transcatheter Aortic Valve Replacement, Transfemoral;  Surgeon:  Burnell Blanks, MD;  Location: [place];  Service: Open Heart Surgery;  Laterality: N/A;   ULTRASOUND GUIDANCE FOR VASCULAR ACCESS  10/10/2022   Procedure: ULTRASOUND GUIDANCE FOR VASCULAR ACCESS;  Surgeon: Burnell Blanks, MD;  Location: [place];  Service: Open Heart Surgery;;   VESICOVAGINAL FISTULA CLOSURE W/ TAH  12/12/1967    Social History   Socioeconomic History   Marital status: Widowed    Spouse name: Not on file   Number of children: 3   Years of education: Not on file   Highest  education level: Not on file  Occupational History   Occupation: Retired Pharmacist, hospital  Tobacco Use   Smoking status: Former    Packs/day: 1.00    Types: Cigarettes    Quit date: 07/08/1992    Years since quitting: 30.6   Smokeless tobacco: Never  Vaping Use   Vaping Use: Never used  Substance and Sexual Activity   Alcohol use: No   Drug use: No   Sexual activity: Not on file  Other Topics Concern   Not on file  Social History Narrative   Not on file   Social Determinants of Health   Financial Resource Strain: Not on file  Food Insecurity: No Food Insecurity (12/27/2022)   Hunger Vital Sign    Worried About Running Out of Food in the Last Year: Never true    Ran Out of Food in the Last Year: Never true  Transportation Needs: No Transportation Needs (12/27/2022)   PRAPARE - Hydrologist (Medical): No    Lack of Transportation (Non-Medical): No  Physical Activity: Not on file  Stress: Not on file  Social Connections: Not on file  Intimate Partner Violence: Not At Risk (12/27/2022)   Humiliation, Afraid, Rape, and Kick questionnaire    Fear of Current or Ex-Partner: No    Emotionally Abused: No    Physically Abused: No    Sexually Abused: No     Allergies  Allergen Reactions   Amlodipine Swelling    LEE with '5mg'$  dose  Foot swelling   Benadryl [Diphenhydramine Hcl (Sleep)] Other (See Comments)    Hyperstimulation   Broccoli Flavor [Flavoring Agent] Diarrhea   Brussels Sprouts [Brassica Oleracea] Diarrhea   Cabbage Diarrhea   Ciprofloxacin Hives   Collard Greens [Wild Lettuce Extract (Lactuca Virosa)] Diarrhea   Gluten Meal    Lactose Intolerance (Gi) Diarrhea   Macrobid [Nitrofurantoin Monohyd Macro] Nausea And Vomiting   Naproxen Other (See Comments)    Stomach pain   Quinine Sulfate [Quinine] Hives   Rosuvastatin     headache   Sulfa Antibiotics Hives        Tramadol Other (See Comments)    unknown to patient   Penicillins Rash     Has patient had a PCN reaction causing immediate rash, facial/tongue/throat swelling, SOB or lightheadedness with hypotension: Yes Has patient had a PCN reaction causing severe rash involving mucus membranes or skin necrosis: No Has patient had a PCN reaction that required hospitalization: No Has patient had a PCN reaction occurring within the last 10 years: No If all of the above answers are "NO", then may proceed with Cephalosporin use. Tolerated Cephalosporin Date: 05/19/22.     Prednisone Other (See Comments)    Weakness and pain in joints     Outpatient Medications Prior to Visit  Medication Sig Dispense Refill   acetaminophen (TYLENOL) 325 MG tablet Take 650 mg by mouth every 6 (six) hours as  needed for moderate pain or mild pain.     Camphor-Menthol-Methyl Sal (SALONPAS) 3.12-16-08 % PTCH Place 1 patch onto the skin daily as needed (pain).     cetirizine (ZYRTEC) 10 MG tablet Take 10 mg by mouth daily as needed for allergies or rhinitis.     CRANBERRY PO Take 1 tablet by mouth at bedtime.     furosemide (LASIX) 20 MG tablet TAKE 1 TABLET BY MOUTH EVERY DAY 90 tablet 3   gabapentin (NEURONTIN) 300 MG capsule Take 600 mg by mouth at bedtime.      HYDROcodone-acetaminophen (NORCO/VICODIN) 5-325 MG tablet Take 1-2 tablets by mouth every 6 (six) hours as needed for severe pain. 42 tablet 0   isosorbide mononitrate (IMDUR) 30 MG 24 hr tablet TAKE 1 TABLET BY MOUTH EVERY DAY 90 tablet 3   losartan (COZAAR) 50 MG tablet Take 1 tablet (50 mg total) by mouth 2 (two) times daily. (Patient taking differently: Take 50 mg by mouth See admin instructions. Take 1 tablet (50 mg) by mouth scheduled every morning, may repeat dose in the afternoon if blood pressure is elevated.) 180 tablet 3   methocarbamol (ROBAXIN) 500 MG tablet Take 1 tablet (500 mg total) by mouth every 6 (six) hours as needed for muscle spasms. 40 tablet 0   nitroGLYCERIN (NITROSTAT) 0.4 MG SL tablet PLACE 1 TABLET UNDER THE TONGUE  EVERY 5 MINUTES AS NEEDED FOR CHEST PAIN. 25 tablet 1   ondansetron (ZOFRAN) 4 MG tablet Take 4 mg by mouth every 4 (four) hours as needed for vomiting or nausea.     ONETOUCH ULTRA test strip CHECK ONCE DAILY AS DIRECTED     Polyethyl Glycol-Propyl Glycol (SYSTANE OP) Place 1 drop into both eyes in the morning and at bedtime.     saccharomyces boulardii (FLORASTOR) 250 MG capsule Take 500 mg by mouth daily in the afternoon.     spironolactone (ALDACTONE) 25 MG tablet Take 1 tablet (25 mg total) by mouth daily. 90 tablet 3   zolpidem (AMBIEN) 5 MG tablet Take 7.5 mg by mouth at bedtime.     No facility-administered medications prior to visit.    ROS   Objective:  Physical Exam   There were no vitals filed for this visit.   on RA BMI Readings from Last 3 Encounters:  12/27/22 23.06 kg/m  12/18/22 23.05 kg/m  11/15/22 23.37 kg/m   Wt Readings from Last 3 Encounters:  12/27/22 126 lb 1.7 oz (57.2 kg)  12/18/22 126 lb (57.2 kg)  11/15/22 127 lb 12.8 oz (58 kg)     CBC    Component Value Date/Time   WBC 8.2 12/29/2022 0334   RBC 3.03 (L) 12/29/2022 0334   HGB 9.5 (L) 12/29/2022 0334   HGB 12.5 11/15/2022 1147   HCT 28.6 (L) 12/29/2022 0334   HCT 38.0 11/15/2022 1147   PLT 148 (L) 12/29/2022 0334   PLT 211 11/15/2022 1147   MCV 94.4 12/29/2022 0334   MCV 94 11/15/2022 1147   MCH 31.4 12/29/2022 0334   MCHC 33.2 12/29/2022 0334   RDW 15.1 12/29/2022 0334   RDW 12.7 11/15/2022 1147   LYMPHSABS 0.6 (L) 11/05/2022 1953   MONOABS 0.8 11/05/2022 1953   EOSABS 0.0 11/05/2022 1953   BASOSABS 0.0 11/05/2022 1953     Chest Imaging: CT chest January 2024: 11 mm pulmonary nodule slowly increasing in size. Located in the left lower lobe.  Right-sided nodule stable. The patient's images have been independently reviewed by  me.    Pulmonary Functions Testing Results:     No data to display          FeNO:   Pathology:   Echocardiogram:   Heart Catheterization:      Assessment & Plan:     ICD-10-CM   1. Nodule of left lung  R91.1     2. Nodule of right lung  R91.1       Discussion: ***   Current Outpatient Medications:    acetaminophen (TYLENOL) 325 MG tablet, Take 650 mg by mouth every 6 (six) hours as needed for moderate pain or mild pain., Disp: , Rfl:    Camphor-Menthol-Methyl Sal (SALONPAS) 3.12-16-08 % PTCH, Place 1 patch onto the skin daily as needed (pain)., Disp: , Rfl:    cetirizine (ZYRTEC) 10 MG tablet, Take 10 mg by mouth daily as needed for allergies or rhinitis., Disp: , Rfl:    CRANBERRY PO, Take 1 tablet by mouth at bedtime., Disp: , Rfl:    furosemide (LASIX) 20 MG tablet, TAKE 1 TABLET BY MOUTH EVERY DAY, Disp: 90 tablet, Rfl: 3   gabapentin (NEURONTIN) 300 MG capsule, Take 600 mg by mouth at bedtime. , Disp: , Rfl:    HYDROcodone-acetaminophen (NORCO/VICODIN) 5-325 MG tablet, Take 1-2 tablets by mouth every 6 (six) hours as needed for severe pain., Disp: 42 tablet, Rfl: 0   isosorbide mononitrate (IMDUR) 30 MG 24 hr tablet, TAKE 1 TABLET BY MOUTH EVERY DAY, Disp: 90 tablet, Rfl: 3   losartan (COZAAR) 50 MG tablet, Take 1 tablet (50 mg total) by mouth 2 (two) times daily. (Patient taking differently: Take 50 mg by mouth See admin instructions. Take 1 tablet (50 mg) by mouth scheduled every morning, may repeat dose in the afternoon if blood pressure is elevated.), Disp: 180 tablet, Rfl: 3   methocarbamol (ROBAXIN) 500 MG tablet, Take 1 tablet (500 mg total) by mouth every 6 (six) hours as needed for muscle spasms., Disp: 40 tablet, Rfl: 0   nitroGLYCERIN (NITROSTAT) 0.4 MG SL tablet, PLACE 1 TABLET UNDER THE TONGUE EVERY 5 MINUTES AS NEEDED FOR CHEST PAIN., Disp: 25 tablet, Rfl: 1   ondansetron (ZOFRAN) 4 MG tablet, Take 4 mg by mouth every 4 (four) hours as needed for vomiting or nausea., Disp: , Rfl:    ONETOUCH ULTRA test strip, CHECK ONCE DAILY AS DIRECTED, Disp: , Rfl:    Polyethyl Glycol-Propyl Glycol (SYSTANE OP), Place 1  drop into both eyes in the morning and at bedtime., Disp: , Rfl:    saccharomyces boulardii (FLORASTOR) 250 MG capsule, Take 500 mg by mouth daily in the afternoon., Disp: , Rfl:    spironolactone (ALDACTONE) 25 MG tablet, Take 1 tablet (25 mg total) by mouth daily., Disp: 90 tablet, Rfl: 3   zolpidem (AMBIEN) 5 MG tablet, Take 7.5 mg by mouth at bedtime., Disp: , Rfl:   I spent *** minutes dedicated to the care of this patient on the date of this encounter to include pre-visit review of records, face-to-face time with the patient discussing conditions above, post visit ordering of testing, clinical documentation with the electronic health record, making appropriate referrals as documented, and communicating necessary findings to members of the patients care team.   Garner Nash, [facility] 02/18/2023 8:12 PM

## 2023-02-19 ENCOUNTER — Encounter: Payer: Self-pay | Admitting: Pulmonary Disease

## 2023-02-19 ENCOUNTER — Ambulatory Visit (INDEPENDENT_AMBULATORY_CARE_PROVIDER_SITE_OTHER): Payer: Medicare Other | Admitting: Pulmonary Disease

## 2023-02-19 ENCOUNTER — Telehealth: Payer: Self-pay

## 2023-02-19 VITALS — BP 130/60 | HR 63 | Ht 62.0 in | Wt 129.8 lb

## 2023-02-19 DIAGNOSIS — Z471 Aftercare following joint replacement surgery: Secondary | ICD-10-CM | POA: Diagnosis not present

## 2023-02-19 DIAGNOSIS — I2510 Atherosclerotic heart disease of native coronary artery without angina pectoris: Secondary | ICD-10-CM | POA: Diagnosis not present

## 2023-02-19 DIAGNOSIS — Z952 Presence of prosthetic heart valve: Secondary | ICD-10-CM | POA: Diagnosis not present

## 2023-02-19 DIAGNOSIS — N1831 Chronic kidney disease, stage 3a: Secondary | ICD-10-CM | POA: Diagnosis not present

## 2023-02-19 DIAGNOSIS — I130 Hypertensive heart and chronic kidney disease with heart failure and stage 1 through stage 4 chronic kidney disease, or unspecified chronic kidney disease: Secondary | ICD-10-CM | POA: Diagnosis not present

## 2023-02-19 DIAGNOSIS — R911 Solitary pulmonary nodule: Secondary | ICD-10-CM | POA: Diagnosis not present

## 2023-02-19 DIAGNOSIS — D631 Anemia in chronic kidney disease: Secondary | ICD-10-CM | POA: Diagnosis not present

## 2023-02-19 DIAGNOSIS — I5032 Chronic diastolic (congestive) heart failure: Secondary | ICD-10-CM | POA: Diagnosis not present

## 2023-02-19 NOTE — Patient Instructions (Signed)
Visit Information  Thank you for taking time to visit with me today. Please don't hesitate to contact me if I can be of assistance to you.   Following are the goals we discussed today:   Goals Addressed             This Visit's Progress    COMPLETED: Care Coordination Activities- No Follow Up Required       Interventions Today    Flowsheet Row Most Recent Value  General Interventions   General Interventions Discussed/Reviewed General Interventions Discussed, Doctor Visits  Doctor Visits Discussed/Reviewed Doctor Visits Discussed, Annual Wellness Visits, PCP  PCP/Specialist Visits Compliance with follow-up visit  Education Interventions   Education Provided Provided Education  Provided Verbal Education On Nutrition, When to see the doctor, Other, Blood Sugar Monitoring  [care coordination services]  Nutrition Interventions   Nutrition Discussed/Reviewed Nutrition Discussed, Adding fruits and vegetables, Increaing proteins, Decreasing salt             If you are experiencing a Mental Health or [place] or need someone to talk to, please call the Suicide and Crisis Lifeline: 988 call the [place] National Suicide Prevention Lifeline: 619-818-1220 or TTY: (713)133-6987 TTY (410)569-0896) to talk to a trained counselor call 1-800-273-TALK (toll free, 24 hour hotline) go to [facility] [phone]) call 911   Patient verbalizes understanding of instructions and care plan provided today and agrees to view in [place]. Active MyChart status and patient understanding of how to access instructions and care plan via MyChart confirmed with patient.     No further follow up required:    [place], Jackquline Denmark, [facility] [phone]

## 2023-02-19 NOTE — Patient Outreach (Signed)
  Care Coordination   Initial Visit Note   02/19/2023 Name: Barbara Russell MRN: [id number] DOB: 11/05/1930  Barbara Russell is a 89 y.o. year old female who sees Seward Carol, MD for primary care. I spoke with  Barbara Russell by phone today.  What matters to the patients health and wellness today?  No concerns today.      Goals Addressed             This Visit's Progress    COMPLETED: Care Coordination Activities- No Follow Up Required       Interventions Today    Flowsheet Row Most Recent Value  General Interventions   General Interventions Discussed/Reviewed General Interventions Discussed, Doctor Visits  Doctor Visits Discussed/Reviewed Doctor Visits Discussed, Annual Wellness Visits, PCP  PCP/Specialist Visits Compliance with follow-up visit  Education Interventions   Education Provided Provided Education  Provided Verbal Education On Nutrition, When to see the doctor, Other, Blood Sugar Monitoring  [care coordination services]  Nutrition Interventions   Nutrition Discussed/Reviewed Nutrition Discussed, Adding fruits and vegetables, Increaing proteins, Decreasing salt              SDOH assessments and interventions completed:  Yes  SDOH Interventions Today    Flowsheet Row Most Recent Value  SDOH Interventions   Food Insecurity Interventions Intervention Not Indicated  Housing Interventions Intervention Not Indicated  Transportation Interventions Intervention Not Indicated  Utilities Interventions Intervention Not Indicated        Care Coordination Interventions:  Yes, provided   Follow up plan: No further intervention required.   Encounter Outcome:  Pt. Visit Completed  Peter Garter RN, BSN,CCM, CDE Care Management Coordinator [facility] [phone]

## 2023-02-19 NOTE — Patient Instructions (Addendum)
Thank you for visiting Dr. Valeta Harms at [facility]. Today we recommend the following:  Orders Placed This Encounter  Procedures   CT Super D Chest Wo Contrast   Follow up after ct chest   Return in about 6 months (around 08/22/2023) for with Eric Form, NP, or Dr. Valeta Harms.    Please do your part to reduce the spread of COVID-19.

## 2023-02-26 DIAGNOSIS — I2510 Atherosclerotic heart disease of native coronary artery without angina pectoris: Secondary | ICD-10-CM | POA: Diagnosis not present

## 2023-02-26 DIAGNOSIS — Z471 Aftercare following joint replacement surgery: Secondary | ICD-10-CM | POA: Diagnosis not present

## 2023-02-26 DIAGNOSIS — I5032 Chronic diastolic (congestive) heart failure: Secondary | ICD-10-CM | POA: Diagnosis not present

## 2023-02-26 DIAGNOSIS — N1831 Chronic kidney disease, stage 3a: Secondary | ICD-10-CM | POA: Diagnosis not present

## 2023-02-26 DIAGNOSIS — D631 Anemia in chronic kidney disease: Secondary | ICD-10-CM | POA: Diagnosis not present

## 2023-02-26 DIAGNOSIS — I130 Hypertensive heart and chronic kidney disease with heart failure and stage 1 through stage 4 chronic kidney disease, or unspecified chronic kidney disease: Secondary | ICD-10-CM | POA: Diagnosis not present

## 2023-03-01 DIAGNOSIS — M545 Low back pain, unspecified: Secondary | ICD-10-CM | POA: Diagnosis not present

## 2023-03-23 DIAGNOSIS — M791 Myalgia, unspecified site: Secondary | ICD-10-CM | POA: Diagnosis not present

## 2023-03-23 DIAGNOSIS — M545 Low back pain, unspecified: Secondary | ICD-10-CM | POA: Diagnosis not present

## 2023-03-23 DIAGNOSIS — M533 Sacrococcygeal disorders, not elsewhere classified: Secondary | ICD-10-CM | POA: Diagnosis not present

## 2023-04-17 DIAGNOSIS — Z6824 Body mass index (BMI) 24.0-24.9, adult: Secondary | ICD-10-CM | POA: Diagnosis not present

## 2023-04-17 DIAGNOSIS — Z01419 Encounter for gynecological examination (general) (routine) without abnormal findings: Secondary | ICD-10-CM | POA: Diagnosis not present

## 2023-04-20 ENCOUNTER — Ambulatory Visit: Payer: Medicare Other | Attending: Cardiology | Admitting: Cardiology

## 2023-04-20 ENCOUNTER — Encounter: Payer: Self-pay | Admitting: Cardiology

## 2023-04-20 VITALS — BP 149/57 | HR 63 | Ht 62.0 in | Wt 130.0 lb

## 2023-04-20 DIAGNOSIS — Z952 Presence of prosthetic heart valve: Secondary | ICD-10-CM | POA: Diagnosis not present

## 2023-04-20 DIAGNOSIS — I10 Essential (primary) hypertension: Secondary | ICD-10-CM | POA: Diagnosis not present

## 2023-04-20 NOTE — Progress Notes (Signed)
Cardiology Office Note:    Date:  04/20/2023   ID:  Barbara Russell, DOB 21-Jan-1930, MRN [id number]  PCP:  Renford Dills, MD   [facility] Providers Cardiologist:  Donato Schultz, MD     Referring MD: Renford Dills, MD    History of Present Illness:    Barbara Russell is a 89 y.o. female TAVR with a 23 mm Edwards Sapien 3 Ultra Resilia THV via the TF approach on 10/10/22.   Overall doing well.  Minor lower extremity edema. Past Medical History:  Diagnosis Date   Anemia    Breast cancer (HCC)    Hx of BC, s/p mastectomy on right 1999, s/p tamoxifen X1 year   Chest pain    EC Stress test 12/21/11 Low risk, no ischemia, normal EF   Chronic kidney disease    Coronary artery disease    Remote Hx of CAD, normal stress test 2009   Diverticulitis    GERD (gastroesophageal reflux disease)    No longer a problem   Hypercholesteremia    Hypertension    Insomnia    Lichen sclerosus    Low back pain    Macular degeneration    Myocardial infarction (HCC) 12/11/1998   Osteoarthrosis, unspecified whether generalized or localized, unspecified site    Pain    Several year Hx of pain/ SOB radiating to her neck   S/P TAVR (transcatheter aortic valve replacement) 10/10/2022   s/p TAVR with a 23mm Edwards S3UR via the TF approach by Dr. Clifton James & Dr. Delia Chimes   Severe aortic stenosis    UTI (urinary tract infection)     Past Surgical History:  Procedure Laterality Date   ABDOMINAL HYSTERECTOMY     APPENDECTOMY     BIOPSY  11/06/2022   Procedure: BIOPSY;  Surgeon: Lynann Bologna, DO;  Location: [facility];  Service: Gastroenterology;;   BREAST BIOPSY Left 11/14/2022   [place] LT BREAST BX W LOC DEV 1ST LESION IMG BX SPEC US GUIDE 11/14/2022 [facility]   ESOPHAGOGASTRODUODENOSCOPY N/A 11/06/2022   Procedure: ESOPHAGOGASTRODUODENOSCOPY (EGD);  Surgeon: Lynann Bologna, DO;  Location: [facility];  Service: Gastroenterology;  Laterality: N/A;   HAND SURGERY Bilateral     CTS and trigger finger on right and left   INTRAOPERATIVE TRANSTHORACIC ECHOCARDIOGRAM N/A 10/10/2022   Procedure: INTRAOPERATIVE TRANSTHORACIC ECHOCARDIOGRAM;  Surgeon: Kathleene Hazel, MD;  Location: [facility] OR;  Service: Open Heart Surgery;  Laterality: N/A;   MASTECTOMY Right 12/11/1997   REPLACEMENT TOTAL KNEE Right 11/10/2009   RIGHT HEART CATH AND CORONARY ANGIOGRAPHY N/A 08/17/2022   Procedure: RIGHT HEART CATH AND CORONARY ANGIOGRAPHY;  Surgeon: Kathleene Hazel, MD;  Location: [facility];  Service: Cardiovascular;  Laterality: N/A;   TOTAL HIP ARTHROPLASTY Right 12/27/2022   Procedure: TOTAL HIP ARTHROPLASTY ANTERIOR APPROACH;  Surgeon: Ollen Gross, MD;  Location: [facility];  Service: Orthopedics;  Laterality: Right;   TOTAL KNEE REVISION Right 05/17/2022   Procedure: Right knee polyethylene revision;  Surgeon: Ollen Gross, MD;  Location: [facility];  Service: Orthopedics;  Laterality: Right;   TRANSCATHETER AORTIC VALVE REPLACEMENT, TRANSFEMORAL N/A 10/10/2022   Procedure: Transcatheter Aortic Valve Replacement, Transfemoral;  Surgeon: Kathleene Hazel, MD;  Location: [facility] OR;  Service: Open Heart Surgery;  Laterality: N/A;   ULTRASOUND GUIDANCE FOR VASCULAR ACCESS  10/10/2022   Procedure: ULTRASOUND GUIDANCE FOR VASCULAR ACCESS;  Surgeon: Kathleene Hazel, MD;  Location: [facility] OR;  Service: Open Heart Surgery;;   VESICOVAGINAL FISTULA CLOSURE  W/ TAH  12/12/1967    Current Medications: Current Meds  Medication Sig   acetaminophen (TYLENOL) 325 MG tablet Take 650 mg by mouth every 6 (six) hours as needed for moderate pain or mild pain.   Camphor-Menthol-Methyl Sal (SALONPAS) 3.12-16-08 % PTCH Place 1 patch onto the skin daily as needed (pain).   cetirizine (ZYRTEC) 10 MG tablet Take 10 mg by mouth daily as needed for allergies or rhinitis.   CRANBERRY PO Take 1 tablet by mouth at bedtime.   furosemide (LASIX) 20 MG tablet TAKE 1 TABLET BY MOUTH EVERY DAY    gabapentin (NEURONTIN) 300 MG capsule Take 600 mg by mouth at bedtime.    HYDROcodone-acetaminophen (NORCO/VICODIN) 5-325 MG tablet Take 1-2 tablets by mouth every 6 (six) hours as needed for severe pain.   isosorbide mononitrate (IMDUR) 30 MG 24 hr tablet TAKE 1 TABLET BY MOUTH EVERY DAY   losartan (COZAAR) 50 MG tablet Take 1 tablet (50 mg total) by mouth 2 (two) times daily. (Patient taking differently: Take 50 mg by mouth daily.)   methocarbamol (ROBAXIN) 500 MG tablet Take 1 tablet (500 mg total) by mouth every 6 (six) hours as needed for muscle spasms.   nitroGLYCERIN (NITROSTAT) 0.4 MG SL tablet PLACE 1 TABLET UNDER THE TONGUE EVERY 5 MINUTES AS NEEDED FOR CHEST PAIN.   ondansetron (ZOFRAN) 4 MG tablet Take 4 mg by mouth every 4 (four) hours as needed for vomiting or nausea.   ONETOUCH ULTRA test strip CHECK ONCE DAILY AS DIRECTED   Polyethyl Glycol-Propyl Glycol (SYSTANE OP) Place 1 drop into both eyes in the morning and at bedtime.   saccharomyces boulardii (FLORASTOR) 250 MG capsule Take 500 mg by mouth daily in the afternoon.   spironolactone (ALDACTONE) 25 MG tablet Take 1 tablet (25 mg total) by mouth daily.   zolpidem (AMBIEN) 5 MG tablet Take 7.5 mg by mouth at bedtime.     Allergies:   Amlodipine, Benadryl [diphenhydramine hcl (sleep)], Broccoli flavor [flavoring agent], Brussels sprouts [brassica oleracea], Cabbage, Ciprofloxacin, Collard greens [wild lettuce extract (lactuca virosa)], Gluten meal, Lactose intolerance (gi), Macrobid [nitrofurantoin monohyd macro], Naproxen, Quinine sulfate [quinine], Rosuvastatin, Sulfa antibiotics, Tramadol, Penicillins, and Prednisone   Social History   Socioeconomic History   Marital status: Widowed    Spouse name: Not on file   Number of children: 3   Years of education: Not on file   Highest education level: Not on file  Occupational History   Occupation: Retired Runner, broadcasting/film/video  Tobacco Use   Smoking status: Former    Packs/day: 1     Types: Cigarettes    Quit date: 07/08/1992    Years since quitting: 30.8   Smokeless tobacco: Never  Vaping Use   Vaping Use: Never used  Substance and Sexual Activity   Alcohol use: No   Drug use: No   Sexual activity: Not on file  Other Topics Concern   Not on file  Social History Narrative   Not on file   Social Determinants of Health   Financial Resource Strain: Not on file  Food Insecurity: No Food Insecurity (02/19/2023)   Hunger Vital Sign    Worried About Running Out of Food in the Last Year: Never true    Ran Out of Food in the Last Year: Never true  Transportation Needs: No Transportation Needs (02/19/2023)   PRAPARE - Administrator, Civil Service (Medical): No    Lack of Transportation (Non-Medical): No  Physical Activity: Not on  file  Stress: Not on file  Social Connections: Not on file     Family History: The patient's family history includes Cancer in her mother; Heart disease in her father.  ROS:   Please see the history of present illness.    No chest pain all other systems reviewed and are negative.  EKGs/Labs/Other Studies Reviewed:    The following studies were reviewed today:  Echocardiogram 10/11/22:   1. The aortic valve is has been replaced by a 23 mm Sapien Valve. Aortic  valve regurgitation is not visualized. No aortic stenosis is present.  There is a 23 mm Edwards valve present in the aortic position. Echo  findings are consistent with normal  structure and function of the aortic valve prosthesis. Mean gradient of 8  mm Hg, Peak gradient 13 mm Hg, DVI 0.67 and EOA 4.5 cm2.   2. Left ventricular ejection fraction, by estimation, is 70 to 75%. The  left ventricle has hyperdynamic function. Left ventricular endocardial  border not optimally defined to evaluate regional wall motion. There is  moderate concentric left ventricular  hypertrophy. Left ventricular diastolic parameters are consistent with  Grade I diastolic dysfunction  (impaired relaxation).   3. Right ventricular systolic function is normal. The right ventricular  size is not well visualized.   4. MVA 4 cm2 by 2D Planimetry. The mitral valve is degenerative. Trivial  mitral valve regurgitation. The mean mitral valve gradient is 5.0 mmHg.  Severe mitral annular calcification.   5. Aortic Normal Ascending Aorta.   Comparison(s): Prior images reviewed side by side. Trivial PVL no longer  visualized.     EKG:  EKG is  ordered today.  The ekg ordered today demonstrates sinus rhythm left axis deviation 73 prior inferior infarct anterior septal infarct pattern.  Prior EKG reviewed, no changes.  Recent Labs: 11/05/2022: B Natriuretic Peptide 79.5 11/06/2022: ALT 15; Magnesium 2.1; TSH 1.773 12/28/2022: BUN 31; Creatinine, Ser 0.75; Potassium 4.5; Sodium 131 12/29/2022: Hemoglobin 9.5; Platelets 148  Recent Lipid Panel No results found for: "CHOL", "TRIG", "HDL", "CHOLHDL", "VLDL", "LDLCALC", "LDLDIRECT"   Risk Assessment/Calculations:              Physical Exam:    VS:  BP (!) 149/57 (BP Location: Left Arm, Patient Position: Sitting, Cuff Size: Normal)   Pulse 63   Ht 5\' 2"  (1.575 m)   Wt 130 lb (59 kg)   LMP  (LMP Unknown)   BMI 23.78 kg/m     Wt Readings from Last 3 Encounters:  04/20/23 130 lb (59 kg)  02/19/23 129 lb 12.8 oz (58.9 kg)  12/27/22 126 lb 1.7 oz (57.2 kg)     GEN:  Well nourished, well developed in no acute distress HEENT: Normal NECK: No JVD; left greater than right carotid bruits, radiation of valve LYMPHATICS: No lymphadenopathy CARDIAC: RRR, /6 systolic murmur no rubs, gallops RESPIRATORY:  Clear to auscultation without rales, wheezing or rhonchi  ABDOMEN: Soft, non-tender, non-distended MUSCULOSKELETAL: 2+ lower extremity edema; No deformity  SKIN: Warm and dry NEUROLOGIC:  Alert and oriented x 3 PSYCHIATRIC:  Normal affect   ASSESSMENT:    No diagnosis found.  PLAN:    In order of problems listed  above:  Severe stenosis status post TAVR - Doing well.  Echocardiogram reassuring.  Doing well.  Dental prophylaxis.  Appreciate TAVR team.  Continuing with aspirin 81 mg.  Lifelong dental prophylaxis.  Hip replacement - Dr. Lequita Halt.  Did very well.  Lower extremity edema -Stable,  6 mth   \We will see her back in 1 month and check basic metabolic profile at that time.   Medication Adjustments/Labs and Tests Ordered: Current medicines are reviewed at length with the patient today.  Concerns regarding medicines are outlined above.  No orders of the defined types were placed in this encounter.  No orders of the defined types were placed in this encounter.   Patient Instructions  Medication Instructions:  The current medical regimen is effective;  continue present plan and medications.  *If you need a refill on your cardiac medications before your next appointment, please call your pharmacy*  Follow-Up: At [facility], you and your health needs are our priority.  As part of our continuing mission to provide you with exceptional heart care, we have created designated Provider Care Teams.  These Care Teams include your primary Cardiologist (physician) and Advanced Practice Providers (APPs -  Physician Assistants and Nurse Practitioners) who all work together to provide you with the care you need, when you need it.  We recommend signing up for the patient portal called "MyChart".  Sign up information is provided on this After Visit Summary.  MyChart is used to connect with patients for Virtual Visits (Telemedicine).  Patients are able to view lab/test results, encounter notes, upcoming appointments, etc.  Non-urgent messages can be sent to your provider as well.   To learn more about what you can do with MyChart, go to [email or web address].    Your next appointment:   1 year(s)  Provider:   Donato Schultz, MD       Signed, Donato Schultz, MD  04/20/2023 11:32 AM    [facility]

## 2023-04-20 NOTE — Patient Instructions (Signed)
Medication Instructions:  The current medical regimen is effective;  continue present plan and medications.  *If you need a refill on your cardiac medications before your next appointment, please call your pharmacy*  Follow-Up: At [facility], you and your health needs are our priority.  As part of our continuing mission to provide you with exceptional heart care, we have created designated Provider Care Teams.  These Care Teams include your primary Cardiologist (physician) and Advanced Practice Providers (APPs -  Physician Assistants and Nurse Practitioners) who all work together to provide you with the care you need, when you need it.  We recommend signing up for the patient portal called "MyChart".  Sign up information is provided on this After Visit Summary.  MyChart is used to connect with patients for Virtual Visits (Telemedicine).  Patients are able to view lab/test results, encounter notes, upcoming appointments, etc.  Non-urgent messages can be sent to your provider as well.   To learn more about what you can do with MyChart, go to [email or web address].    Your next appointment:   1 year(s)  Provider:   Mark Skains, MD      

## 2023-04-24 ENCOUNTER — Telehealth: Payer: Self-pay | Admitting: Pharmacist

## 2023-04-24 NOTE — Telephone Encounter (Signed)
Patient called me to say she forgot to tell Dr. Anne Fu that she is no longer taking isosorbide because when she takes it, it lowers her BP too much. States if he wants her to take it, she can take 1/2 a tablet. Denies any chest pain. I have removed from med list and will send to Dr. Anne Fu as an Lorain Childes

## 2023-04-25 DIAGNOSIS — N1831 Chronic kidney disease, stage 3a: Secondary | ICD-10-CM | POA: Diagnosis not present

## 2023-04-25 DIAGNOSIS — D631 Anemia in chronic kidney disease: Secondary | ICD-10-CM | POA: Diagnosis not present

## 2023-04-25 DIAGNOSIS — R197 Diarrhea, unspecified: Secondary | ICD-10-CM | POA: Diagnosis not present

## 2023-04-27 DIAGNOSIS — H353133 Nonexudative age-related macular degeneration, bilateral, advanced atrophic without subfoveal involvement: Secondary | ICD-10-CM | POA: Diagnosis not present

## 2023-05-04 DIAGNOSIS — I2510 Atherosclerotic heart disease of native coronary artery without angina pectoris: Secondary | ICD-10-CM | POA: Diagnosis not present

## 2023-05-04 DIAGNOSIS — M810 Age-related osteoporosis without current pathological fracture: Secondary | ICD-10-CM | POA: Diagnosis not present

## 2023-05-04 DIAGNOSIS — Z1331 Encounter for screening for depression: Secondary | ICD-10-CM | POA: Diagnosis not present

## 2023-05-04 DIAGNOSIS — N1831 Chronic kidney disease, stage 3a: Secondary | ICD-10-CM | POA: Diagnosis not present

## 2023-05-04 DIAGNOSIS — I5032 Chronic diastolic (congestive) heart failure: Secondary | ICD-10-CM | POA: Diagnosis not present

## 2023-05-04 DIAGNOSIS — E0822 Diabetes mellitus due to underlying condition with diabetic chronic kidney disease: Secondary | ICD-10-CM | POA: Diagnosis not present

## 2023-05-04 DIAGNOSIS — Z0000 Encounter for general adult medical examination without abnormal findings: Secondary | ICD-10-CM | POA: Diagnosis not present

## 2023-05-04 DIAGNOSIS — D631 Anemia in chronic kidney disease: Secondary | ICD-10-CM | POA: Diagnosis not present

## 2023-05-04 DIAGNOSIS — E1169 Type 2 diabetes mellitus with other specified complication: Secondary | ICD-10-CM | POA: Diagnosis not present

## 2023-05-26 ENCOUNTER — Other Ambulatory Visit: Payer: Self-pay | Admitting: Cardiology

## 2023-05-30 DIAGNOSIS — H029 Unspecified disorder of eyelid: Secondary | ICD-10-CM | POA: Diagnosis not present

## 2023-05-30 DIAGNOSIS — H9202 Otalgia, left ear: Secondary | ICD-10-CM | POA: Diagnosis not present

## 2023-05-30 DIAGNOSIS — R0982 Postnasal drip: Secondary | ICD-10-CM | POA: Diagnosis not present

## 2023-06-05 DIAGNOSIS — B029 Zoster without complications: Secondary | ICD-10-CM | POA: Diagnosis not present

## 2023-06-08 ENCOUNTER — Telehealth: Payer: Self-pay | Admitting: Pulmonary Disease

## 2023-06-12 ENCOUNTER — Observation Stay (HOSPITAL_COMMUNITY)
Admission: EM | Admit: 2023-06-12 | Discharge: 2023-06-13 | Disposition: A | Discharge status: Home / Self Care | Payer: Medicare Other | Attending: Internal Medicine | Admitting: Internal Medicine

## 2023-06-12 ENCOUNTER — Encounter (HOSPITAL_COMMUNITY): Payer: Self-pay | Admitting: Internal Medicine

## 2023-06-12 ENCOUNTER — Other Ambulatory Visit: Payer: Self-pay

## 2023-06-12 ENCOUNTER — Emergency Department (HOSPITAL_COMMUNITY): Payer: Medicare Other

## 2023-06-12 DIAGNOSIS — Z87891 Personal history of nicotine dependence: Secondary | ICD-10-CM | POA: Insufficient documentation

## 2023-06-12 DIAGNOSIS — D72829 Elevated white blood cell count, unspecified: Secondary | ICD-10-CM

## 2023-06-12 DIAGNOSIS — I5032 Chronic diastolic (congestive) heart failure: Secondary | ICD-10-CM | POA: Diagnosis not present

## 2023-06-12 DIAGNOSIS — R110 Nausea: Secondary | ICD-10-CM | POA: Diagnosis not present

## 2023-06-12 DIAGNOSIS — A084 Viral intestinal infection, unspecified: Principal | ICD-10-CM

## 2023-06-12 DIAGNOSIS — K529 Noninfective gastroenteritis and colitis, unspecified: Secondary | ICD-10-CM | POA: Diagnosis not present

## 2023-06-12 DIAGNOSIS — N179 Acute kidney failure, unspecified: Secondary | ICD-10-CM | POA: Diagnosis present

## 2023-06-12 DIAGNOSIS — E860 Dehydration: Secondary | ICD-10-CM | POA: Diagnosis not present

## 2023-06-12 DIAGNOSIS — K5730 Diverticulosis of large intestine without perforation or abscess without bleeding: Secondary | ICD-10-CM | POA: Diagnosis not present

## 2023-06-12 DIAGNOSIS — Z853 Personal history of malignant neoplasm of breast: Secondary | ICD-10-CM | POA: Insufficient documentation

## 2023-06-12 DIAGNOSIS — I110 Hypertensive heart disease with heart failure: Secondary | ICD-10-CM | POA: Insufficient documentation

## 2023-06-12 DIAGNOSIS — E871 Hypo-osmolality and hyponatremia: Secondary | ICD-10-CM

## 2023-06-12 DIAGNOSIS — Z7982 Long term (current) use of aspirin: Secondary | ICD-10-CM | POA: Diagnosis not present

## 2023-06-12 DIAGNOSIS — J309 Allergic rhinitis, unspecified: Secondary | ICD-10-CM | POA: Diagnosis not present

## 2023-06-12 DIAGNOSIS — Z79899 Other long term (current) drug therapy: Secondary | ICD-10-CM | POA: Insufficient documentation

## 2023-06-12 DIAGNOSIS — R000 Tachycardia, unspecified: Secondary | ICD-10-CM | POA: Diagnosis not present

## 2023-06-12 DIAGNOSIS — R231 Pallor: Secondary | ICD-10-CM | POA: Diagnosis not present

## 2023-06-12 DIAGNOSIS — I959 Hypotension, unspecified: Secondary | ICD-10-CM | POA: Diagnosis not present

## 2023-06-12 DIAGNOSIS — N281 Cyst of kidney, acquired: Secondary | ICD-10-CM | POA: Diagnosis not present

## 2023-06-12 DIAGNOSIS — J301 Allergic rhinitis due to pollen: Secondary | ICD-10-CM | POA: Diagnosis not present

## 2023-06-12 DIAGNOSIS — R197 Diarrhea, unspecified: Secondary | ICD-10-CM

## 2023-06-12 DIAGNOSIS — I10 Essential (primary) hypertension: Secondary | ICD-10-CM

## 2023-06-12 DIAGNOSIS — R55 Syncope and collapse: Secondary | ICD-10-CM | POA: Diagnosis not present

## 2023-06-12 DIAGNOSIS — N19 Unspecified kidney failure: Secondary | ICD-10-CM | POA: Diagnosis not present

## 2023-06-12 DIAGNOSIS — Z952 Presence of prosthetic heart valve: Secondary | ICD-10-CM | POA: Insufficient documentation

## 2023-06-12 DIAGNOSIS — R1030 Lower abdominal pain, unspecified: Secondary | ICD-10-CM | POA: Diagnosis not present

## 2023-06-12 LAB — CBC WITH DIFFERENTIAL/PLATELET
Abs Immature Granulocytes: 0.06 10*3/uL (ref 0.00–0.07)
Basophils Absolute: 0 10*3/uL (ref 0.0–0.1)
Basophils Relative: 0 %
Eosinophils Absolute: 0 10*3/uL (ref 0.0–0.5)
Eosinophils Relative: 0 %
HCT: 36.9 % (ref 36.0–46.0)
Hemoglobin: 12.1 g/dL (ref 12.0–15.0)
Immature Granulocytes: 0 %
Lymphocytes Relative: 4 %
Lymphs Abs: 0.7 10*3/uL (ref 0.7–4.0)
MCH: 30.3 pg (ref 26.0–34.0)
MCHC: 32.8 g/dL (ref 30.0–36.0)
MCV: 92.3 fL (ref 80.0–100.0)
Monocytes Absolute: 0.9 10*3/uL (ref 0.1–1.0)
Monocytes Relative: 6 %
Neutro Abs: 14.3 10*3/uL — ABNORMAL HIGH (ref 1.7–7.7)
Neutrophils Relative %: 90 %
Platelets: 216 10*3/uL (ref 150–400)
RBC: 4 MIL/uL (ref 3.87–5.11)
RDW: 15.1 % (ref 11.5–15.5)
WBC: 16 10*3/uL — ABNORMAL HIGH (ref 4.0–10.5)
nRBC: 0 % (ref 0.0–0.2)

## 2023-06-12 LAB — COMPREHENSIVE METABOLIC PANEL
ALT: 30 U/L (ref 0–44)
AST: 38 U/L (ref 15–41)
Albumin: 4 g/dL (ref 3.5–5.0)
Alkaline Phosphatase: 161 U/L — ABNORMAL HIGH (ref 38–126)
Anion gap: 9 (ref 5–15)
BUN: 39 mg/dL — ABNORMAL HIGH (ref 8–23)
CO2: 24 mmol/L (ref 22–32)
Calcium: 9.2 mg/dL (ref 8.9–10.3)
Chloride: 96 mmol/L — ABNORMAL LOW (ref 98–111)
Creatinine, Ser: 1.29 mg/dL — ABNORMAL HIGH (ref 0.44–1.00)
GFR, Estimated: 39 mL/min — ABNORMAL LOW (ref >60)
Glucose, Bld: 195 mg/dL — ABNORMAL HIGH (ref 70–99)
Potassium: 4.1 mmol/L (ref 3.5–5.1)
Sodium: 129 mmol/L — ABNORMAL LOW (ref 135–145)
Total Bilirubin: 0.7 mg/dL (ref 0.3–1.2)
Total Protein: 6.9 g/dL (ref 6.5–8.1)

## 2023-06-12 LAB — TSH: TSH: 1.676 u[IU]/mL (ref 0.350–4.500)

## 2023-06-12 LAB — CBG MONITORING, ED: Glucose-Capillary: 179 mg/dL — ABNORMAL HIGH (ref 70–99)

## 2023-06-12 LAB — LIPASE, BLOOD: Lipase: 41 U/L (ref 11–51)

## 2023-06-12 LAB — MAGNESIUM: Magnesium: 2 mg/dL (ref 1.7–2.4)

## 2023-06-12 IMAGING — DX DG SHOULDER 2+V*R*
3 series · 3 of 3 positions shown · non-contrast
Comparison: Right shoulder x-ray 06/09/2014.

CLINICAL DATA: Fall.

EXAM:
RIGHT SHOULDER - 2+ VIEW

[dg shoulder right (1 of 3)]
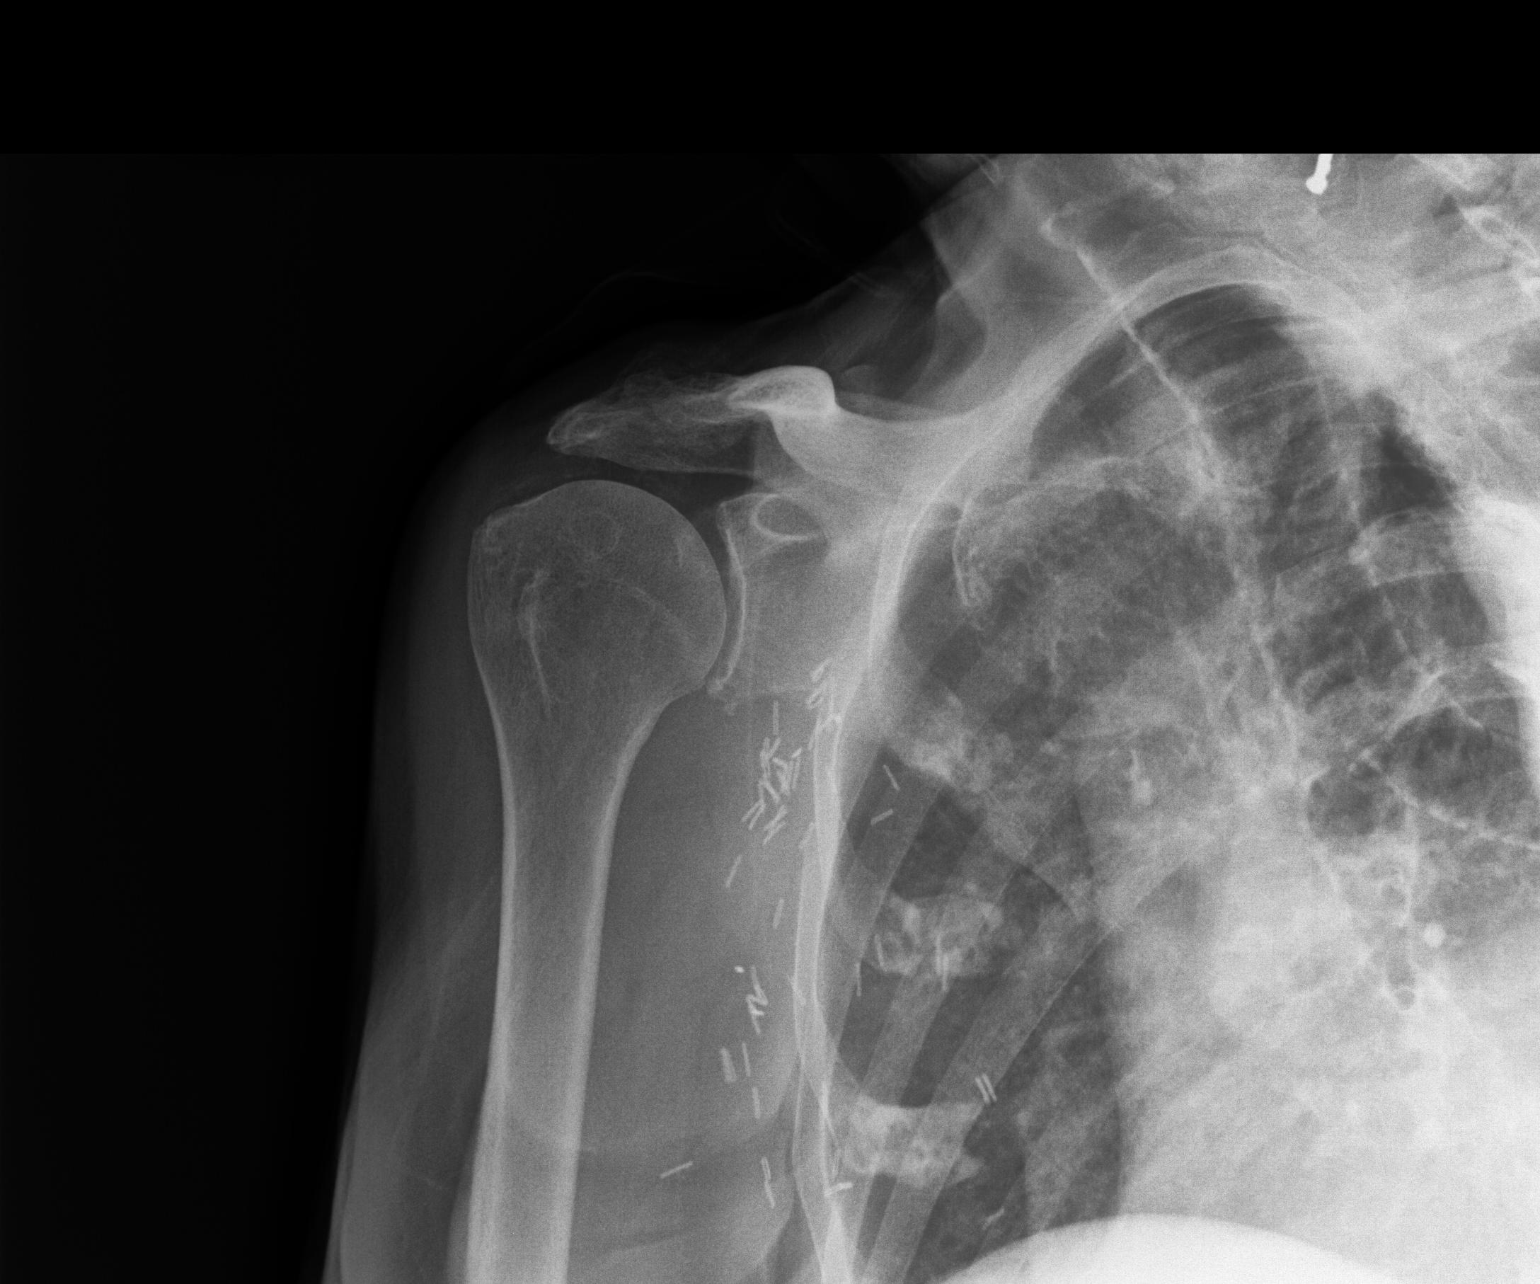

[dg shoulder right (2 of 3)]
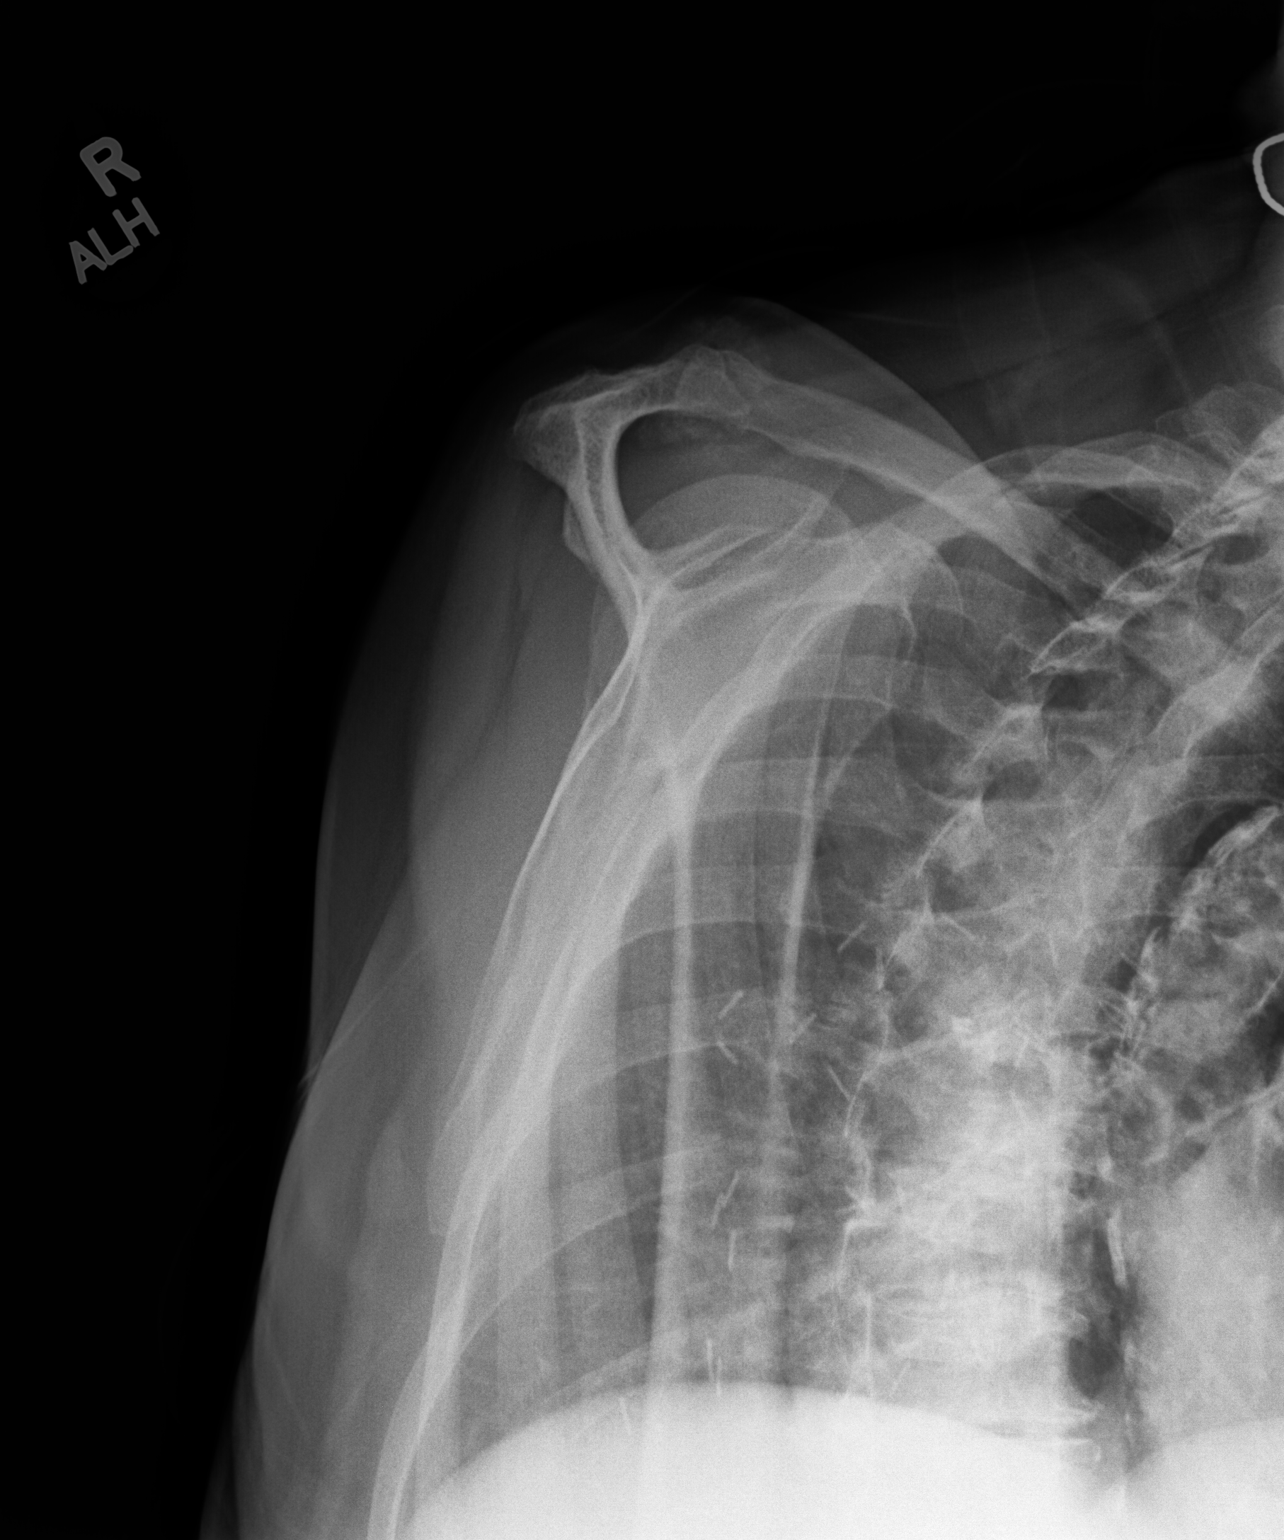

[dg shoulder right (3 of 3)]
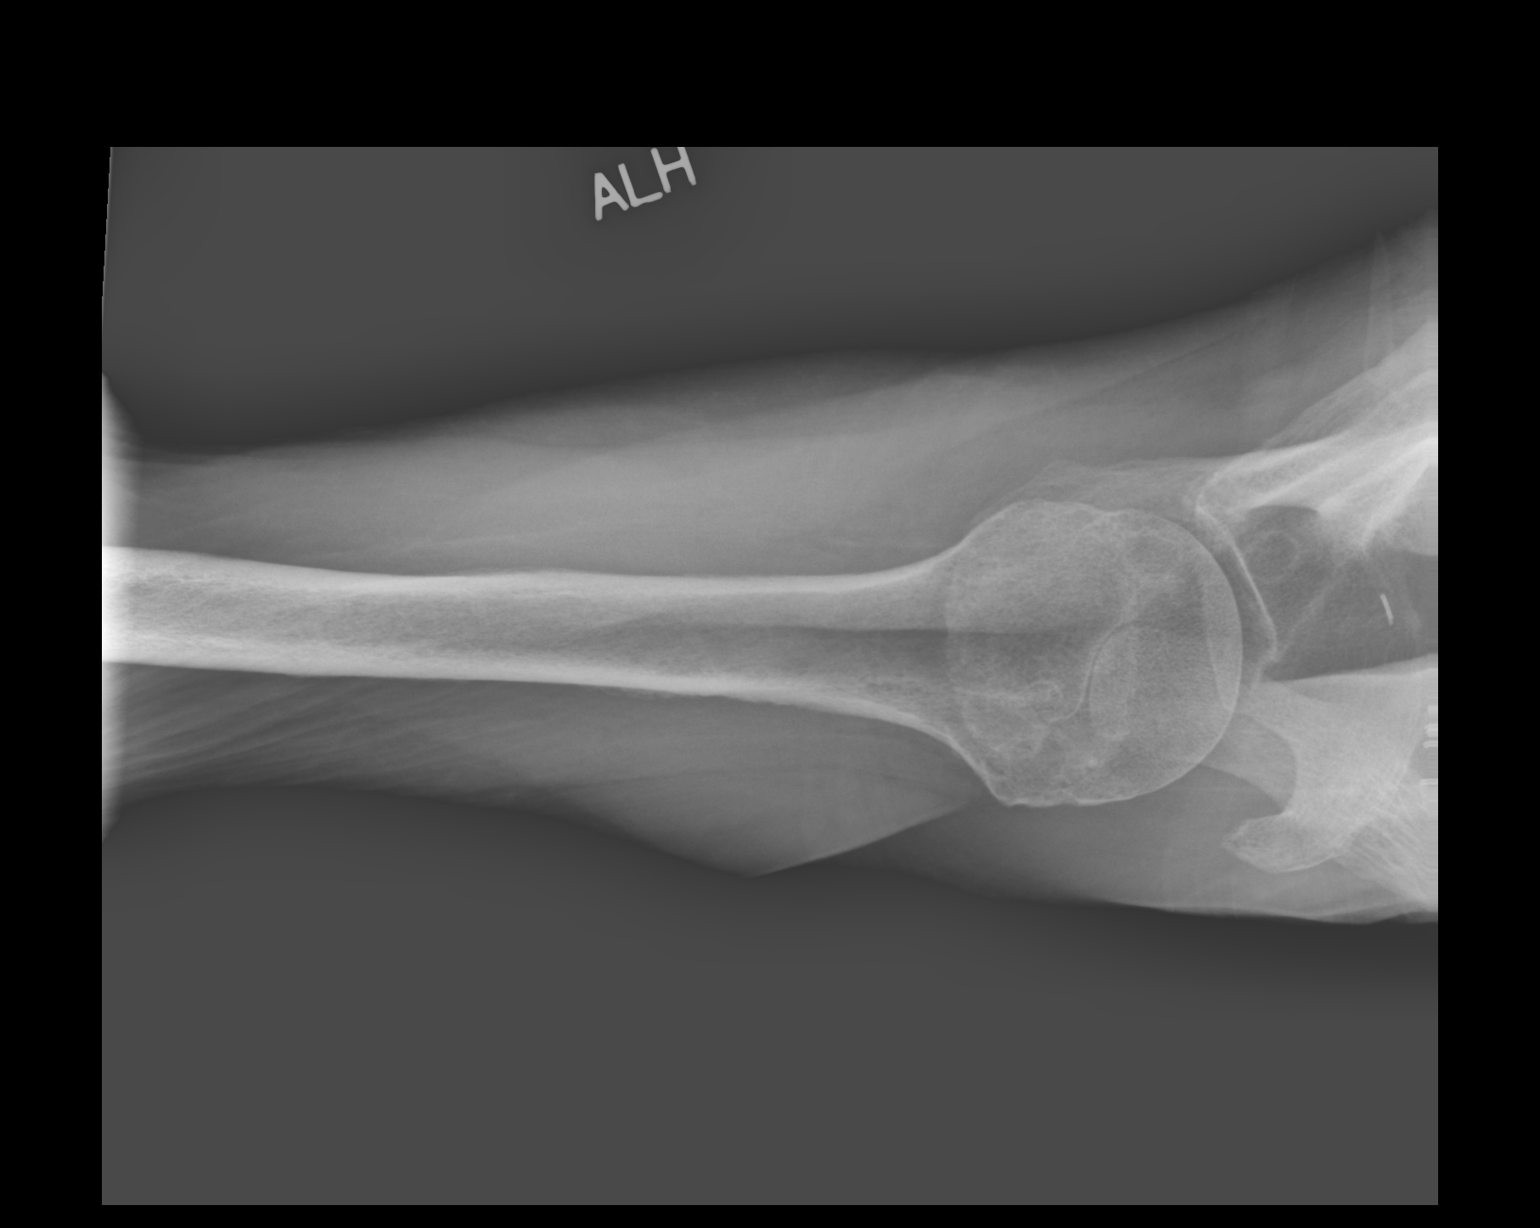

[3 of 3 positions shown; findings below may reference images not displayed]

FINDINGS: No evidence for acute fracture or dislocation. There are moderate
degenerative changes of the acromioclavicular joint with joint space
narrowing and osteophyte formation. There is mild joint space
narrowing and osteophyte formation of the glenohumeral joint.
Findings have progressed compared to the prior study. Surgical clips
overlie the right chest, unchanged. Soft tissues are otherwise
within normal limits.
IMPRESSION: 1. Mild/moderate degenerative changes of the right shoulder have
progressed.
2. No acute bony abnormality.

## 2023-06-12 MED ORDER — ONDANSETRON HCL 4 MG/2ML IJ SOLN: 4.0000 mg | Freq: Four times a day (QID) | INTRAMUSCULAR | Status: DC | PRN

## 2023-06-12 MED ORDER — IOHEXOL 300 MG/ML  SOLN
80.0000 mL | Freq: Once | INTRAMUSCULAR | Status: AC | PRN
  Administered 2023-06-12: 80 mL via INTRAVENOUS

## 2023-06-12 MED ORDER — FENTANYL CITRATE PF 50 MCG/ML IJ SOSY: 25.0000 ug | PREFILLED_SYRINGE | INTRAMUSCULAR | Status: DC | PRN

## 2023-06-12 MED ORDER — FENTANYL CITRATE PF 50 MCG/ML IJ SOSY: 12.5000 ug | PREFILLED_SYRINGE | INTRAMUSCULAR | Status: DC | PRN

## 2023-06-12 MED ORDER — SACCHAROMYCES BOULARDII 250 MG PO CAPS: 500.0000 mg | ORAL_CAPSULE | Freq: Every day | ORAL | Status: DC

## 2023-06-12 MED ORDER — ASPIRIN 81 MG PO TBEC
81.0000 mg | DELAYED_RELEASE_TABLET | Freq: Every day | ORAL | Status: DC
  Administered 2023-06-13: 81 mg via ORAL
  Filled 2023-06-12: qty 1

## 2023-06-12 MED ORDER — ACETAMINOPHEN 650 MG RE SUPP: 650.0000 mg | Freq: Four times a day (QID) | RECTAL | Status: DC | PRN

## 2023-06-12 MED ORDER — MELATONIN 3 MG PO TABS: 3.0000 mg | ORAL_TABLET | Freq: Every evening | ORAL | Status: DC | PRN

## 2023-06-12 MED ORDER — NALOXONE HCL 0.4 MG/ML IJ SOLN: 0.4000 mg | INTRAMUSCULAR | Status: DC | PRN

## 2023-06-12 MED ORDER — SODIUM CHLORIDE 0.9 % IV SOLN: INTRAVENOUS | Status: DC

## 2023-06-12 MED ORDER — FLUTICASONE PROPIONATE 50 MCG/ACT NA SUSP
2.0000 | Freq: Every day | NASAL | Status: DC
  Filled 2023-06-12: qty 16

## 2023-06-12 MED ORDER — ACETAMINOPHEN 325 MG PO TABS: 650.0000 mg | ORAL_TABLET | Freq: Four times a day (QID) | ORAL | Status: DC | PRN

## 2023-06-12 NOTE — ED Provider Notes (Addendum)
[facility] AT [facility] Provider Note   CSN: [id number] Arrival date & time: 06/12/23  1537     History  Chief Complaint  Patient presents with   Weakness    Barbara Russell is a 89 y.o. female.  Patient felt fine when she awoke this morning.  [facility] at a [facility] living [facility].  Sometime after breakfast somewhere around 10 in the morning she started to get nauseated abdominal cramps and then had several watery bowel movements no blood in them.  Patient diagnosed with shingles to her left forehead area about a week ago is on an antiviral for that.  Patient is not on antibiotics.  Patient was admitted back at the end of November 2023 with gastritis.  Patient did not have any vomiting but was nauseated.  She did take Zofran twice and she thinks this may be why she has not thrown up.  Past medical history sniffer hypertension high cholesterol coronary artery disease myocardial infarction in 2000 and history of diverticulitis chronic kidney disease gastroesophageal reflux disease status post TAVR October 2023 also known to have macular degeneration as her vision is not very good.  Patient's had an abdominal hysterectomy.  Total hip replacement in January 2024 patient is a former smoker quit in 1993.       Home Medications Prior to Admission medications   Medication Sig Start Date End Date Taking? Authorizing Provider  acetaminophen (TYLENOL) 325 MG tablet Take 650 mg by mouth every 6 (six) hours as needed for moderate pain or mild pain.    [provider]  Camphor-Menthol-Methyl Sal (SALONPAS) 3.12-16-08 % PTCH Place 1 patch onto the skin daily as needed (pain).    [provider]  cetirizine (ZYRTEC) 10 MG tablet Take 10 mg by mouth daily as needed for allergies or rhinitis.    [provider]  CRANBERRY PO Take 1 tablet by mouth at bedtime.    [provider]  furosemide (LASIX) 20 MG tablet TAKE 1 TABLET BY MOUTH  EVERY DAY 01/23/23   Jake Bathe, MD  gabapentin (NEURONTIN) 300 MG capsule Take 600 mg by mouth at bedtime.     [provider]  HYDROcodone-acetaminophen (NORCO/VICODIN) 5-325 MG tablet Take 1-2 tablets by mouth every 6 (six) hours as needed for severe pain. 12/28/22   Eartha Inch, PA  losartan (COZAAR) 50 MG tablet TAKE 1 TABLET BY MOUTH TWICE A DAY 05/28/23   Jake Bathe, MD  methocarbamol (ROBAXIN) 500 MG tablet Take 1 tablet (500 mg total) by mouth every 6 (six) hours as needed for muscle spasms. 12/28/22   Eartha Inch, PA  nitroGLYCERIN (NITROSTAT) 0.4 MG SL tablet PLACE 1 TABLET UNDER THE TONGUE EVERY 5 MINUTES AS NEEDED FOR CHEST PAIN. 12/18/22   Jake Bathe, MD  ondansetron (ZOFRAN) 4 MG tablet Take 4 mg by mouth every 4 (four) hours as needed for vomiting or nausea.    [provider]  [facility] ULTRA test strip CHECK ONCE DAILY AS DIRECTED 05/29/21   [provider]  Polyethyl Glycol-Propyl Glycol (SYSTANE OP) Place 1 drop into both eyes in the morning and at bedtime.    [provider]  saccharomyces boulardii (FLORASTOR) 250 MG capsule Take 500 mg by mouth daily in the afternoon. 12/23/21   [provider]  spironolactone (ALDACTONE) 25 MG tablet Take 1 tablet (25 mg total) by mouth daily. 09/07/22   Jake Bathe, MD  zolpidem (AMBIEN) 5 MG tablet  Take 7.5 mg by mouth at bedtime.    [provider]      Allergies    Amlodipine, Benadryl [diphenhydramine hcl (sleep)], Broccoli flavor [flavoring agent], Brussels sprouts [brassica oleracea], Cabbage, Ciprofloxacin, Collard greens [wild lettuce extract (lactuca virosa)], Gluten meal, Lactose intolerance (gi), Macrobid [nitrofurantoin monohyd macro], Naproxen, Quinine sulfate [quinine], Rosuvastatin, Sulfa antibiotics, Tramadol, Penicillins, and Prednisone    Review of Systems   Review of Systems  Constitutional:  Negative for chills and fever.  HENT:  Negative for ear  pain and sore throat.   Eyes:  Positive for visual disturbance. Negative for pain.  Respiratory:  Negative for cough and shortness of breath.   Cardiovascular:  Negative for chest pain and palpitations.  Gastrointestinal:  Positive for abdominal pain, diarrhea and nausea. Negative for vomiting.  Genitourinary:  Negative for dysuria and hematuria.  Musculoskeletal:  Negative for arthralgias and back pain.  Skin:  Negative for color change and rash.  Neurological:  Negative for seizures and syncope.  All other systems reviewed and are negative.   Physical Exam Updated Vital Signs BP (!) 114/43   Pulse 85   Temp (!) 97.2 F (36.2 C) (Oral)   Resp 20   LMP  (LMP Unknown)   SpO2 95%  Physical Exam Vitals and nursing note reviewed.  Constitutional:      General: She is not in acute distress.    Appearance: She is well-developed.  HENT:     Head: Normocephalic and atraumatic.     Mouth/Throat:     Mouth: Mucous membranes are dry.  Eyes:     Extraocular Movements: Extraocular movements intact.     Conjunctiva/sclera: Conjunctivae normal.     Pupils: Pupils are equal, round, and reactive to light.  Cardiovascular:     Rate and Rhythm: Normal rate and regular rhythm.     Heart sounds: No murmur heard. Pulmonary:     Effort: Pulmonary effort is normal. No respiratory distress.     Breath sounds: Normal breath sounds.  Abdominal:     Palpations: Abdomen is soft.     Tenderness: There is abdominal tenderness. There is guarding.     Comments: Bilateral lower quadrants left greater than right.  Musculoskeletal:        General: No swelling.     Cervical back: Neck supple.     Right lower leg: No edema.     Left lower leg: No edema.  Skin:    General: Skin is warm and dry.     Capillary Refill: Capillary refill takes less than 2 seconds.     Findings: Rash present.     Comments: Rash consistent with shingles to left forehead and into the scalp area.  Neurological:     General:  No focal deficit present.     Mental Status: She is alert and oriented to person, place, and time.  Psychiatric:        Mood and Affect: Mood normal.     ED Results / Procedures / Treatments   Labs (all labs ordered are listed, but only abnormal results are displayed) Labs Reviewed  CBC WITH DIFFERENTIAL/PLATELET - Abnormal; Notable for the following components:      Result Value   WBC 16.0 (*)    Neutro Abs 14.3 (*)    All other components within normal limits  COMPREHENSIVE METABOLIC PANEL - Abnormal; Notable for the following components:   Sodium 129 (*)    Chloride 96 (*)    Glucose, Bld  195 (*)    BUN 39 (*)    Creatinine, Ser 1.29 (*)    Alkaline Phosphatase 161 (*)    GFR, Estimated 39 (*)    All other components within normal limits  CBG MONITORING, ED - Abnormal; Notable for the following components:   Glucose-Capillary 179 (*)    All other components within normal limits  LIPASE, BLOOD    EKG EKG Interpretation Date/Time:  Tuesday June 12 2023 16:10:37 EDT Ventricular Rate:  89 PR Interval:  234 QRS Duration:  99 QT Interval:  380 QTC Calculation: 463 R Axis:   -58  Text Interpretation: Sinus rhythm Prolonged PR interval Inferior infarct, old Anteroseptal infarct, age indeterminate No significant change since last tracing Confirmed by Vanetta Mulders [phone] on 06/12/2023 4:29:57 PM  Radiology CT ABDOMEN PELVIS W CONTRAST  Result Date: 06/12/2023 CLINICAL DATA:  Left lower quadrant abdominal pain. EXAM: CT ABDOMEN AND PELVIS WITH CONTRAST TECHNIQUE: Multidetector CT imaging of the abdomen and pelvis was performed using the standard protocol following bolus administration of intravenous contrast. RADIATION DOSE REDUCTION: This exam was performed according to the departmental dose-optimization program which includes automated exposure control, adjustment of the mA and/or kV according to patient size and/or use of iterative reconstruction technique. CONTRAST:  80mL  OMNIPAQUE IOHEXOL 300 MG/ML  SOLN COMPARISON:  CT dated 08/30/2022. FINDINGS: Evaluation is limited due to streak artifact caused by right hip arthroplasty and spinal fusion hardware. Lower chest: The visualized lung bases are clear. There is calcification of the mitral annulus. Aortic valve repair. No intra-abdominal free air or free fluid. Hepatobiliary: The liver is unremarkable. No biliary dilatation. The gallbladder is unremarkable. Pancreas: Unremarkable. No pancreatic ductal dilatation or surrounding inflammatory changes. Spleen: Normal in size without focal abnormality. Adrenals/Urinary Tract: The adrenal glands are unremarkable. There is a 1.5 cm left renal upper pole cyst. There is no hydronephrosis on either side. There is symmetric enhancement and excretion of contrast by both kidneys. The visualized ureters and urinary bladder appear unremarkable. Stomach/Bowel: There is diffuse inflammatory changes and thickening of the descending and sigmoid colon consistent with colitis. There is sigmoid diverticulosis. No bowel obstruction. The appendix is not visualized with certainty. No inflammatory changes identified in the right lower quadrant. Vascular/Lymphatic: Moderate aortoiliac atherosclerotic disease. The IVC is unremarkable. No free gas. There is no adenopathy. Reproductive: Hysterectomy. Other: None Musculoskeletal: Osteopenia with degenerative changes of the spine. Total right hip arthroplasty and lower lumbar posterior fusion. No acute osseous pathology. IMPRESSION: 1. Distal colitis.  No bowel obstruction. 2. Sigmoid diverticulosis. 3.  Aortic Atherosclerosis (ICD10-I70.0). Electronically Signed   By: Elgie Collard M.D.   On: 06/12/2023 18:43    Procedures Procedures    Medications Ordered in ED Medications  0.9 %  sodium chloride infusion ( Intravenous New Bag/Given 06/12/23 1721)  iohexol (OMNIPAQUE) 300 MG/ML solution 80 mL (80 mLs Intravenous Contrast Given 06/12/23 1812)    ED  Course/ Medical Decision Making/ A&P                             Medical Decision Making Amount and/or Complexity of Data Reviewed Labs: ordered. Radiology: ordered.  Risk Prescription drug management. Decision regarding hospitalization.  Patient with acute on sat of diarrhea type illness.  Along with domino cramping does have tenderness to lower quadrants left greater than right has had a history of diverticulitis.  Will get CT scan of the abdomen to evaluate further.  Patient's blood  pressure was a little on the low side she received 500 cc bolus and pressures now currently are 114/43.  Mucous membranes are a little bit dry we will start maintenance fluids will get labs and as stated we will get CT of the abdomen.  Patient denies any chest pain or shortness of breath.  Patient is being treated for shingles with a antiviral.  Not sure which 1.  Patient is followed by cardiology Donato Schultz.  Patient was concerned she may have had food poisoning.  She does have a significant leukocytosis with a white count at 16,000.  Platelets are good at 216 hemoglobin good at 12.1.  Lipase normal at 41 complete metabolic panel significant for alk phos 161 bilirubin normal AST ALT normal.  Electrolyte wise sodium low at 129 potassium good at 4.1 glucose 195 GFR is 39.  Patient is GFR was greater than 60 just in January.  So this seems to be a significant change.  With her age the diarrhea.  Feel the patient probably benefits admission for hydration.  CT scan of the abdomen pelvis significant for colitis no other acute findings no evidence of diverticulitis.  Will discuss admission with hospitalist.  Will also discuss whether they would want to give her some antibiotics.  Will order C. difficile and stool cultures.  Final Clinical Impression(s) / ED Diagnoses Final diagnoses:  Lower abdominal pain  Diarrhea, unspecified type  AKI (acute kidney injury) (HCC)  Colitis  Hyponatremia    Rx / DC Orders ED  Discharge Orders     None         Vanetta Mulders, MD 06/12/23 1653    Vanetta Mulders, MD 06/12/23 1909

## 2023-06-12 NOTE — H&P (Signed)
History and Physical      Barbara Russell HYQ:[id number] DOB: [birth year] DOA: 06/12/2023; DOS: 06/12/2023  PCP: Renford Dills, MD  Patient coming from: Permanent resident at [facility]  I have personally briefly reviewed patient's old medical records in [facility]  Chief Complaint: Loose stool  HPI: Barbara Russell is a 89 y.o. female with medical history significant for diverticulitis, essential hypertension, severe aortic stenosis status post TAVR in October 2023, chronic diastolic heart failure, allergic rhinitis, who is admitted to [facility] on 06/12/2023 with suspected gastroenterocolitis after presenting from [facility] to [facility] ED complaining of diarrhea.   The patient reports that she was at her baseline level of health this morning.  She underwent a typical breakfast, consisting of grits as well as eggs prepared for easy.  Approximately 1.5 to 2.0 hours following completion of consumption of this breakfast, she developed nausea as well as crampy abdominal discomfort.  She notes that she did not vomit as a consequence of this nausea, but notes that she took multiple doses of Zofran ODT prior to arrival in the emergency department today.  The nausea component has improved subsequently.  But she notes that she has subsequently developed to 3 episodes of loose, watery stool, in the absence of any associated melena or hematochezia.  She notes that she continues to experience some crampy abdominal discomfort, which it initially was in the bilateral upper abdominal quadrants, but is now more located in the lower abdominal quadrants, more prominently in the left lower abdominal quadrant, worse with palpation over this area.  She denies any associated strict fever, chills, rigors, or generalized myalgias.  No recent dysuria or gross hematuria or change in urinary urgency/frequency.  No recent trauma or travel.  No known sick contacts.  She conveys recent diagnosis of shingles over  the left eye, without ocular involvement, having completed an antiviral course for this.  Denies rash in any other lip location.  No reported recent antibiotic use.  No recent chest pain, shortness of breath, cough, palpitations, diaphoresis, dizziness Recently, or syncope.  No headache or neck stiffness.  Medical history notable for chronic diastolic heart failure, with most recent echocardiogram in December 2023, which was notable for LVEF 6570%, no focal motion MIs, grade 1 diastolic function and normal right ventricular systolic function.  Her outpatient medications are notable for Lasix, spironolactone, and losartan.  Per chart review, her 2 most recent prior serum sodium levels were 135 on 12/18/2022 followed by 131 on 12/28/2022.    ED Course:  Vital signs in the ED were notable for the following: Afebrile; heart is in the 80s; systolic blood pressures in the 1 teens; respiratory rate 20, oxygen saturation 95% on room air.  Labs were notable for the following: CMP notable for the following: Sodium 129, which corrects to approximately 130.6 when taking into account concomitant hyperglycemia 195, potassium 4.1, chloride 96, bicarb 24, creatinine 1.29 relative to most recent prior serum creatinine data point of 0.7501 1824, BUN DeGrand ratio 30.2, liver enzymes within normal limits.  Lipase 41.  CBC notable for will with cell count of 16,000, hemoglobin 12.1.  C. difficile PCR as well as GI panel PCR been ordered, the results of the pending.  Per my interpretation, EKG in ED demonstrated the following: Relative to most recent prior EKG performed on 11/05/2022, presenting EKG shows sinus rhythm first-degree AV block, heart rate 89, otherwise, normal intervals, Q waves in the inferior leads as well as Q waves  in leads V1 through V3, all of which appear unchanged relative to most recent prior EKG, demonstrate no evidence of T wave or ST changes, Cleen evidence of ST elevation.  Imaging in the ED, per  corresponding formal radiology read, was notable for the following: CT Abdo/pelvis with contrast showed diffuse inflammatory changes and thickening of the descending and sigmoid colon consistent with colitis, while showing sigmoid diverticulosis in the absence of any evidence of diverticulitis and demonstrated no evidence of obstruction, abscess, or perforation.  While in the ED, the following were administered: She was started on continuous normal saline at 100 cc/h.  Subsequently, the patient was admitted with suspected gastroenterocolitis, complicated by dehydration, acute kidney injury, with presenting labs notable for mild leukocytosis and subacute hyponatremia.    Review of Systems: As per HPI otherwise 10 point review of systems negative.   Past Medical History:  Diagnosis Date   Anemia    Breast cancer (HCC)    Hx of BC, s/p mastectomy on right 1999, s/p tamoxifen X1 year   Chest pain    EC Stress test 12/21/11 Low risk, no ischemia, normal EF   Chronic kidney disease    Coronary artery disease    Remote Hx of CAD, normal stress test 2009   Diverticulitis    GERD (gastroesophageal reflux disease)    No longer a problem   Hypercholesteremia    Hypertension    Insomnia    Lichen sclerosus    Low back pain    Macular degeneration    Myocardial infarction (HCC) 12/11/1998   Osteoarthrosis, unspecified whether generalized or localized, unspecified site    Pain    Several year Hx of pain/ SOB radiating to her neck   S/P TAVR (transcatheter aortic valve replacement) 10/10/2022   s/p TAVR with a 23mm Edwards S3UR via the TF approach by Dr. Clifton James & Dr. Delia Chimes   Severe aortic stenosis    UTI (urinary tract infection)     Past Surgical History:  Procedure Laterality Date   ABDOMINAL HYSTERECTOMY     APPENDECTOMY     BIOPSY  11/06/2022   Procedure: BIOPSY;  Surgeon: Lynann Bologna, DO;  Location: [facility];  Service: Gastroenterology;;   BREAST BIOPSY Left 11/14/2022    [place] LT BREAST BX W LOC DEV 1ST LESION IMG BX SPEC US GUIDE 11/14/2022 [facility]   ESOPHAGOGASTRODUODENOSCOPY N/A 11/06/2022   Procedure: ESOPHAGOGASTRODUODENOSCOPY (EGD);  Surgeon: Lynann Bologna, DO;  Location: [facility];  Service: Gastroenterology;  Laterality: N/A;   HAND SURGERY Bilateral    CTS and trigger finger on right and left   INTRAOPERATIVE TRANSTHORACIC ECHOCARDIOGRAM N/A 10/10/2022   Procedure: INTRAOPERATIVE TRANSTHORACIC ECHOCARDIOGRAM;  Surgeon: Kathleene Hazel, MD;  Location: [facility] OR;  Service: Open Heart Surgery;  Laterality: N/A;   MASTECTOMY Right 12/11/1997   REPLACEMENT TOTAL KNEE Right 11/10/2009   RIGHT HEART CATH AND CORONARY ANGIOGRAPHY N/A 08/17/2022   Procedure: RIGHT HEART CATH AND CORONARY ANGIOGRAPHY;  Surgeon: Kathleene Hazel, MD;  Location: [facility];  Service: Cardiovascular;  Laterality: N/A;   TOTAL HIP ARTHROPLASTY Right 12/27/2022   Procedure: TOTAL HIP ARTHROPLASTY ANTERIOR APPROACH;  Surgeon: Ollen Gross, MD;  Location: [facility];  Service: Orthopedics;  Laterality: Right;   TOTAL KNEE REVISION Right 05/17/2022   Procedure: Right knee polyethylene revision;  Surgeon: Ollen Gross, MD;  Location: [facility];  Service: Orthopedics;  Laterality: Right;   TRANSCATHETER AORTIC VALVE REPLACEMENT, TRANSFEMORAL N/A 10/10/2022   Procedure: Transcatheter Aortic  Valve Replacement, Transfemoral;  Surgeon: Kathleene Hazel, MD;  Location: [facility] OR;  Service: Open Heart Surgery;  Laterality: N/A;   ULTRASOUND GUIDANCE FOR VASCULAR ACCESS  10/10/2022   Procedure: ULTRASOUND GUIDANCE FOR VASCULAR ACCESS;  Surgeon: Kathleene Hazel, MD;  Location: [facility] OR;  Service: Open Heart Surgery;;   VESICOVAGINAL FISTULA CLOSURE W/ TAH  12/12/1967    Social History:  reports that she quit smoking about 30 years ago. Her smoking use included cigarettes. She smoked an average of 1 pack per day. She has never used smokeless tobacco. She  reports that she does not drink alcohol and does not use drugs.   Allergies  Allergen Reactions   Amlodipine Swelling    LEE with 5mg  dose  Foot swelling   Benadryl [Diphenhydramine Hcl (Sleep)] Other (See Comments)    Hyperstimulation   Broccoli Flavor [Flavoring Agent] Diarrhea   Brussels Sprouts [Brassica Oleracea] Diarrhea   Cabbage Diarrhea   Ciprofloxacin Hives   Collard Greens [Wild Lettuce Extract (Lactuca Virosa)] Diarrhea   Gluten Meal    Lactose Intolerance (Gi) Diarrhea   Macrobid [Nitrofurantoin Monohyd Macro] Nausea And Vomiting   Naproxen Other (See Comments)    Stomach pain   Quinine Sulfate [Quinine] Hives   Rosuvastatin     headache   Sulfa Antibiotics Hives        Tramadol Other (See Comments)    unknown to patient   Penicillins Rash    Has patient had a PCN reaction causing immediate rash, facial/tongue/throat swelling, SOB or lightheadedness with hypotension: Yes Has patient had a PCN reaction causing severe rash involving mucus membranes or skin necrosis: No Has patient had a PCN reaction that required hospitalization: No Has patient had a PCN reaction occurring within the last 10 years: No If all of the above answers are "NO", then may proceed with Cephalosporin use. Tolerated Cephalosporin Date: 05/19/22.     Prednisone Other (See Comments)    Weakness and pain in joints    Family History  Problem Relation Age of Onset   Cancer Mother        cervical   Heart disease Father     Family history reviewed and not pertinent    Prior to Admission medications   Medication Sig Start Date End Date Taking? Authorizing Provider  acetaminophen (TYLENOL) 325 MG tablet Take 650 mg by mouth every 6 (six) hours as needed for moderate pain or mild pain.   Yes [provider]  aspirin EC 81 MG tablet Take 81 mg by mouth daily. Swallow whole.   Yes [provider]  cetirizine (ZYRTEC) 10 MG tablet Take 10 mg by mouth daily as needed for  allergies or rhinitis.   Yes [provider]  CRANBERRY PO Take 1 tablet by mouth at bedtime.   Yes [provider]  fluticasone (FLONASE) 50 MCG/ACT nasal spray Place 2 sprays into both nostrils daily.   Yes [provider]  furosemide (LASIX) 20 MG tablet TAKE 1 TABLET BY MOUTH EVERY DAY 01/23/23  Yes Jake Bathe, MD  gabapentin (NEURONTIN) 300 MG capsule Take 300 mg by mouth 2 (two) times daily.   Yes [provider]  HYDROcodone-acetaminophen (NORCO/VICODIN) 5-325 MG tablet Take 1-2 tablets by mouth every 6 (six) hours as needed for severe pain. 12/28/22  Yes Eartha Inch, PA  losartan (COZAAR) 50 MG tablet TAKE 1 TABLET BY MOUTH TWICE A DAY 05/28/23  Yes Jake Bathe, MD  methocarbamol (ROBAXIN) 500 MG tablet  Take 1 tablet (500 mg total) by mouth every 6 (six) hours as needed for muscle spasms. 12/28/22  Yes Eartha Inch, PA  Multiple Vitamins-Minerals (PRESERVISION AREDS 2 PO) Take 1 capsule by mouth daily.   Yes [provider]  ondansetron (ZOFRAN) 4 MG tablet Take 4 mg by mouth every 4 (four) hours as needed for vomiting or nausea.   Yes [provider]  Polyethyl Glycol-Propyl Glycol (SYSTANE OP) Place 1 drop into both eyes in the morning and at bedtime.   Yes [provider]  POTASSIUM BICARBONATE PO Take 1 tablet by mouth every other day.   Yes [provider]  saccharomyces boulardii (FLORASTOR) 250 MG capsule Take 500 mg by mouth daily in the afternoon. 12/23/21  Yes [provider]  spironolactone (ALDACTONE) 25 MG tablet Take 1 tablet (25 mg total) by mouth daily. 09/07/22  Yes Jake Bathe, MD  zolpidem (AMBIEN) 5 MG tablet Take 7.5 mg by mouth at bedtime.   Yes [provider]  Camphor-Menthol-Methyl Sal (SALONPAS) 3.12-16-08 % PTCH Place 1 patch onto the skin daily as needed (pain).    [provider]  nitroGLYCERIN (NITROSTAT) 0.4 MG SL tablet PLACE 1 TABLET UNDER THE TONGUE  EVERY 5 MINUTES AS NEEDED FOR CHEST PAIN. Patient taking differently: Place 0.4 mg under the tongue every 5 (five) minutes as needed for chest pain. 12/18/22   Jake Bathe, MD  ONETOUCH ULTRA test strip CHECK ONCE DAILY AS DIRECTED 05/29/21   [provider]     Objective    Physical Exam: Vitals:   06/12/23 1615 06/12/23 1617  BP: (!) 114/43   Pulse: 85   Resp: 20   Temp:  (!) 97.2 F (36.2 C)  TempSrc:  Oral  SpO2: 95%     General: appears to be stated age; alert, oriented Skin: warm, dry, no rash Head:  AT/[place] Mouth:  Oral mucosa membranes appear dry, normal dentition Neck: supple; trachea midline Heart:  RRR; did not appreciate any M/R/G Lungs: CTAB, did not appreciate any wheezes, rales, or rhonchi Abdomen: + BS; soft, ND, mild tenderness over the left lower abdominal quadrant, in the absence of any associated guarding, rigidity, or rebound tenderness Vascular: 2+ pedal pulses b/l; 2+ radial pulses b/l Extremities: no peripheral edema, no muscle wasting Neuro: strength and sensation intact in upper and lower extremities b/l    Labs on Admission: I have personally reviewed following labs and imaging studies  CBC: Recent Labs  Lab 06/12/23 1709  WBC 16.0*  NEUTROABS 14.3*  HGB 12.1  HCT 36.9  MCV 92.3  PLT 216   Basic Metabolic Panel: Recent Labs  Lab 06/12/23 1709  NA 129*  K 4.1  CL 96*  CO2 24  GLUCOSE 195*  BUN 39*  CREATININE 1.29*  CALCIUM 9.2  MG 2.0   GFR: CrCl cannot be calculated (Unknown ideal weight.). Liver Function Tests: Recent Labs  Lab 06/12/23 1709  AST 38  ALT 30  ALKPHOS 161*  BILITOT 0.7  PROT 6.9  ALBUMIN 4.0   Recent Labs  Lab 06/12/23 1709  LIPASE 41   No results for input(s): "AMMONIA" in the last 168 hours. Coagulation Profile: No results for input(s): "INR", "PROTIME" in the last 168 hours. Cardiac Enzymes: No results for input(s): "CKTOTAL", "CKMB", "CKMBINDEX", "TROPONINI" in the last 168  hours. BNP (last 3 results) No results for input(s): "PROBNP" in the last 8760 hours. HbA1C: No results for input(s): "HGBA1C" in the last 72 hours.  CBG: Recent Labs  Lab 06/12/23 1615  GLUCAP 179*   Lipid Profile: No results for input(s): "CHOL", "HDL", "LDLCALC", "TRIG", "CHOLHDL", "LDLDIRECT" in the last 72 hours. Thyroid Function Tests: No results for input(s): "TSH", "T4TOTAL", "FREET4", "T3FREE", "THYROIDAB" in the last 72 hours. Anemia Panel: No results for input(s): "VITAMINB12", "FOLATE", "FERRITIN", "TIBC", "IRON", "RETICCTPCT" in the last 72 hours. Urine analysis:    Component Value Date/Time   COLORURINE YELLOW 03/18/2022 1008   APPEARANCEUR CLOUDY (A) 03/18/2022 1008   LABSPEC 1.013 03/18/2022 1008   PHURINE 7.0 03/18/2022 1008   GLUCOSEU NEGATIVE 03/18/2022 1008   HGBUR LARGE (A) 03/18/2022 1008   BILIRUBINUR NEGATIVE 03/18/2022 1008   KETONESUR NEGATIVE 03/18/2022 1008   PROTEINUR 30 (A) 03/18/2022 1008   UROBILINOGEN 0.2 12/02/2009 0805   NITRITE POSITIVE (A) 03/18/2022 1008   LEUKOCYTESUR LARGE (A) 03/18/2022 1008    Radiological Exams on Admission: CT ABDOMEN PELVIS W CONTRAST  Result Date: 06/12/2023 CLINICAL DATA:  Left lower quadrant abdominal pain. EXAM: CT ABDOMEN AND PELVIS WITH CONTRAST TECHNIQUE: Multidetector CT imaging of the abdomen and pelvis was performed using the standard protocol following bolus administration of intravenous contrast. RADIATION DOSE REDUCTION: This exam was performed according to the departmental dose-optimization program which includes automated exposure control, adjustment of the mA and/or kV according to patient size and/or use of iterative reconstruction technique. CONTRAST:  80mL OMNIPAQUE IOHEXOL 300 MG/ML  SOLN COMPARISON:  CT dated 08/30/2022. FINDINGS: Evaluation is limited due to streak artifact caused by right hip arthroplasty and spinal fusion hardware. Lower chest: The visualized lung bases are clear. There is  calcification of the mitral annulus. Aortic valve repair. No intra-abdominal free air or free fluid. Hepatobiliary: The liver is unremarkable. No biliary dilatation. The gallbladder is unremarkable. Pancreas: Unremarkable. No pancreatic ductal dilatation or surrounding inflammatory changes. Spleen: Normal in size without focal abnormality. Adrenals/Urinary Tract: The adrenal glands are unremarkable. There is a 1.5 cm left renal upper pole cyst. There is no hydronephrosis on either side. There is symmetric enhancement and excretion of contrast by both kidneys. The visualized ureters and urinary bladder appear unremarkable. Stomach/Bowel: There is diffuse inflammatory changes and thickening of the descending and sigmoid colon consistent with colitis. There is sigmoid diverticulosis. No bowel obstruction. The appendix is not visualized with certainty. No inflammatory changes identified in the right lower quadrant. Vascular/Lymphatic: Moderate aortoiliac atherosclerotic disease. The IVC is unremarkable. No free gas. There is no adenopathy. Reproductive: Hysterectomy. Other: None Musculoskeletal: Osteopenia with degenerative changes of the spine. Total right hip arthroplasty and lower lumbar posterior fusion. No acute osseous pathology. IMPRESSION: 1. Distal colitis.  No bowel obstruction. 2. Sigmoid diverticulosis. 3.  Aortic Atherosclerosis (ICD10-I70.0). Electronically Signed   By: Elgie Collard M.D.   On: 06/12/2023 18:43      Assessment/Plan    Principal Problem:   Gastroenteritis and colitis, viral Active Problems:   Essential hypertension   Acute hyponatremia   Nausea   Acute prerenal azotemia   Leukocytosis   AKI (acute kidney injury) (HCC)   Chronic diastolic CHF (congestive heart failure) (HCC)   Allergic rhinitis     #) Gastroenterocolitis: Presentation appears suggestive of gastroenterocolitis given initial symptoms that include nausea, crampy abdominal discomfort in the upper  abdominal quadrants, with interval improvement in nausea as well as interval improvement in upper quadrant abdominal discomfort, now with more distal crampy abdominal discomfort and development of loose stool, with CT abdomen/pelvis showing evidence of colitis limited to the descending and sigmoid colon.  The sequence of the symptoms and radiographic appearance on CT scan appear consistent with a gastroenterocolitis picture.  The timeframe of the symptoms appears consistent with a viral process, although food poisoning would also be in the differential.  She has noted to have had recent viral infection in the form of shingles outbreak over the left eye, as further noted above, without ocular involvement.  It is noted however that the patient does have a neutrophilic predominant leukocytosis.  Consequently, C. difficile PCR and GI panel by PCR have been ordered, with results currently pending.  Aside from her mild leukocytosis, no additional SIRS criteria met at this time.  Therefore, criteria not currently met for sepsis.  She appears hemodynamically stable.  Consequently, will refrain from initiation of IV antibiotics at this time.  CT abdomen/pelvis shows no evidence of bowel obstruction, abscess, or perforation.  While noting mild acute kidney injury, no evidence of elevation in total bilirubin to suggest a hemolytic uremic pathway.  Plan: Continuous normal saline, as above.  Monitor strict I's and O's and daily weights.  Follow for results of C. difficile PCR as well as GI panel by PCR.  Prn IV Zofran.  As needed IV fentanyl for residual crampy abdominal discomfort.  Hold home Lasix and spironolactone.  Check urinalysis, TSH.  Repeat CMP, CBC in the morning.  Refraining from antibiotics for now, as above.  TSH.                    #) Dehydration: Clinical suspicion for such, including the appearance of dry oral mucous membranes as well as laboratory findings notable for acute prerenal  azotemia. Appears to be in the setting of   increased GI losses over the course of today, namely in the setting of multiple episodes of loose watery stool, concomitant with decline in oral intake in the setting of intermittent nausea over that timeframe.  No e/o associated hypotension.  Potential pharmacologic exacerbation in setting of outpatient use of Lasix as well as spironolactone.   Plan: Monitor strict I's and O's.  Daily weights.  CMP in the morning. IVF's in form of normal saline at 100 cc/h.  Hold home Lasix and spironolactone for now.  Further evaluation management of presenting suspected gastroenterocolitis, as above.Marland Kitchen                  #) Subacute hyponatremia: Presenting serum sodium level, after correction for hyperglycemia is noted to be 130.6, similar demonstration prior value 131 from 12/28/2022, and relative to [id number].  Given her presenting GI losses over the course of today, will closely monitor ensuing serum sodium level as outlined below.  Plan: Monitor strict I's and O's and daily weights.  Further evaluation management of suspected presenting gastroenterocolitis, as above.  Repeat CMP in the morning.  Serum, urine osmolality.  TSH.               #) Acute Kidney Injury:  as quantified above.  Appears prerenal in nature in the setting of dehydration stemming from increase GI losses, as above, and potentially compounded by outpatient use of spironolactone and Lasix.  Additional potential pharmacologic exacerbation via outpatient losartan as well as gabapentin.  Plan: monitor strict I's & O's and daily weights. Attempt to avoid nephrotoxic agents.  Hold home Lasix, spironolactone, losartan, and gabapentin for now.  Refrain from NSAIDs. Repeat CMP in the morning. Check serum magnesium level.  Check urinalysis with microscopy.  Add-on random urine sodium and random urine creatinine.  IV fluids, as above.                 #) Essential  Hypertension: documented h/o such, with outpatient antihypertensive regimen including spironolactone, Lasix, losartan.  SBP's in the ED today: 1 teens mmHg. in the setting of evidence of dehydration and acute kidney injury, will hold home spironolactone, Lasix, and losartan for now.  Plan: Close monitoring of subsequent BP via routine VS. hold outpatient importance of medications for now, as further outlined above.                    #) Chronic diastolic heart failure: documented history of such, with most recent echocardiogram performed in December 2020, which is notable for LVEF 65 to 70% as well as grade 1 diastolic dysfunction, with additional results as conveyed above. No clinical evidence to suggest acutely decompensated heart failure at this time rather, presentation associated with clinical appearance of dehydration. home diuretic regimen reportedly consists of the following: Lasix and spironolactone.   Plan: monitor strict I's & O's and daily weights. Repeat CMP in AM. Check serum mag level.  Hold home diuretic regimen.                   #) Allergic Rhinitis: documented h/o such, on scheduled intranasal Flonase as outpatient.    Plan: cont home Flonase.       DVT prophylaxis: SCD's   Code Status: Full code Family Communication: none Disposition Plan: Per Rounding Team Consults called: none;  Admission status: obs    I SPENT GREATER THAN 75  MINUTES IN CLINICAL CARE TIME/MEDICAL DECISION-MAKING IN COMPLETING THIS ADMISSION.     Chaney Born Shaneka Efaw DO [facility] From 7PM - 7AM   06/12/2023, 8:21 PM

## 2023-06-12 NOTE — ED Triage Notes (Signed)
EMS report. Patient has had shingles weakness and nauseas, Low BP.   94/48 CBG

## 2023-06-12 NOTE — ED Notes (Signed)
ED TO INPATIENT HANDOFF REPORT  Name/Age/Gender Barbara Russell 89 y.o. female  Code Status    Code Status Orders  (From admission, onward)           Start     Ordered   06/12/23 1944  Full code  Continuous       Question:  By:  Answer:  Consent: discussion documented in EHR   06/12/23 1943           Code Status History     Date Active Date Inactive Code Status Order ID Comments User Context   12/27/2022 1752 12/29/2022 1651 Full Code [id number]  Eartha Inch, PA Inpatient   11/06/2022 0020 11/07/2022 1701 Full Code [id number]  Therisa Doyne, MD Inpatient   10/10/2022 1023 10/11/2022 1606 Full Code [id number]  Janetta Hora, PA-C Inpatient   08/17/2022 1106 08/17/2022 1851 Full Code [id number]  Kathleene Hazel, MD Inpatient   05/17/2022 1704 05/20/2022 1645 Full Code [id number]  Derenda Fennel, PA Inpatient       Home/SNF/Other Home  Chief Complaint Gastroenteritis and colitis, viral [A08.4]  Level of Care/Admitting Diagnosis ED Disposition     ED Disposition  Admit   Condition  --   Comment  Hospital Area: [facility] [[id number]]  Level of Care: Telemetry [5]  Admit to tele based on following criteria: Monitor for Ischemic changes  May place patient in observation at [facility] or [facility] if equivalent level of care is available:: No  Covid Evaluation: Asymptomatic - no recent exposure (last 10 days) testing not required  Diagnosis: Gastroenteritis and colitis, viral [244010]  Admitting Physician: Angie Fava [[id number]]  Attending Physician: Angie Fava [[id number]]          Medical History Past Medical History:  Diagnosis Date   Anemia    Breast cancer (HCC)    Hx of BC, s/p mastectomy on right 1999, s/p tamoxifen X1 year   Chest pain    EC Stress test 12/21/11 Low risk, no ischemia, normal EF   Chronic kidney disease    Coronary artery disease    Remote Hx of CAD, normal stress test 2009    Diverticulitis    GERD (gastroesophageal reflux disease)    No longer a problem   Hypercholesteremia    Hypertension    Insomnia    Lichen sclerosus    Low back pain    Macular degeneration    Myocardial infarction (HCC) 12/11/1998   Osteoarthrosis, unspecified whether generalized or localized, unspecified site    Pain    Several year Hx of pain/ SOB radiating to her neck   S/P TAVR (transcatheter aortic valve replacement) 10/10/2022   s/p TAVR with a 23mm Edwards S3UR via the TF approach by Dr. Clifton James & Dr. Delia Chimes   Severe aortic stenosis    UTI (urinary tract infection)     Allergies Allergies  Allergen Reactions   Amlodipine Swelling    LEE with 5mg  dose  Foot swelling   Benadryl [Diphenhydramine Hcl (Sleep)] Other (See Comments)    Hyperstimulation   Broccoli Flavor [Flavoring Agent] Diarrhea   Brussels Sprouts [Brassica Oleracea] Diarrhea   Cabbage Diarrhea   Ciprofloxacin Hives   Collard Greens [Wild Lettuce Extract (Lactuca Virosa)] Diarrhea   Gluten Meal    Lactose Intolerance (Gi) Diarrhea   Macrobid [Nitrofurantoin Monohyd Macro] Nausea And Vomiting   Naproxen Other (See Comments)    Stomach pain   Quinine Sulfate [Quinine] Hives   Rosuvastatin  headache   Sulfa Antibiotics Hives        Tramadol Other (See Comments)    unknown to patient   Penicillins Rash    Has patient had a PCN reaction causing immediate rash, facial/tongue/throat swelling, SOB or lightheadedness with hypotension: Yes Has patient had a PCN reaction causing severe rash involving mucus membranes or skin necrosis: No Has patient had a PCN reaction that required hospitalization: No Has patient had a PCN reaction occurring within the last 10 years: No If all of the above answers are "NO", then may proceed with Cephalosporin use. Tolerated Cephalosporin Date: 05/19/22.     Prednisone Other (See Comments)    Weakness and pain in joints    IV Location/Drains/Wounds Patient  Lines/Drains/Airways Status     Active Line/Drains/Airways     Name Placement date Placement time Site Days   Peripheral IV 06/12/23 20 G Left Antecubital 06/12/23  --  Antecubital  less than 1            Labs/Imaging Results for orders placed or performed during the hospital encounter of 06/12/23 (from the past 48 hour(s))  CBG monitoring, ED     Status: Abnormal   Collection Time: 06/12/23  4:15 PM  Result Value Ref Range   Glucose-Capillary 179 (H) 70 - 99 mg/dL    Comment: Glucose reference range applies only to samples taken after fasting for at least 8 hours.  CBC with Differential/Platelet     Status: Abnormal   Collection Time: 06/12/23  5:09 PM  Result Value Ref Range   WBC 16.0 (H) 4.0 - 10.5 K/uL   RBC 4.00 3.87 - 5.11 MIL/uL   Hemoglobin 12.1 12.0 - 15.0 g/dL   HCT 16.1 09.6 - 04.5 %   MCV 92.3 80.0 - 100.0 fL   MCH 30.3 26.0 - 34.0 pg   MCHC 32.8 30.0 - 36.0 g/dL   RDW 40.9 81.1 - 91.4 %   Platelets 216 150 - 400 K/uL   nRBC 0.0 0.0 - 0.2 %   Neutrophils Relative % 90 %   Neutro Abs 14.3 (H) 1.7 - 7.7 K/uL   Lymphocytes Relative 4 %   Lymphs Abs 0.7 0.7 - 4.0 K/uL   Monocytes Relative 6 %   Monocytes Absolute 0.9 0.1 - 1.0 K/uL   Eosinophils Relative 0 %   Eosinophils Absolute 0.0 0.0 - 0.5 K/uL   Basophils Relative 0 %   Basophils Absolute 0.0 0.0 - 0.1 K/uL   Immature Granulocytes 0 %   Abs Immature Granulocytes 0.06 0.00 - 0.07 K/uL    Comment: Performed at [facility], [address], [place], [place] [address]  Lipase, blood     Status: None   Collection Time: 06/12/23  5:09 PM  Result Value Ref Range   Lipase 41 11 - 51 U/L    Comment: Performed at [facility], [address], [place], [place] [address]  Comprehensive metabolic panel     Status: Abnormal   Collection Time: 06/12/23  5:09 PM  Result Value Ref Range   Sodium 129 (L) 135 - 145 mmol/L   Potassium 4.1 3.5 - 5.1 mmol/L   Chloride 96 (L) 98 -  111 mmol/L   CO2 24 22 - 32 mmol/L   Glucose, Bld 195 (H) 70 - 99 mg/dL    Comment: Glucose reference range applies only to samples taken after fasting for at least 8 hours.   BUN 39 (H) 8 - 23  mg/dL   Creatinine, Ser 3.08 (H) 0.44 - 1.00 mg/dL   Calcium 9.2 8.9 - 65.7 mg/dL   Total Protein 6.9 6.5 - 8.1 g/dL   Albumin 4.0 3.5 - 5.0 g/dL   AST 38 15 - 41 U/L   ALT 30 0 - 44 U/L   Alkaline Phosphatase 161 (H) 38 - 126 U/L   Total Bilirubin 0.7 0.3 - 1.2 mg/dL   GFR, Estimated 39 (L) >60 mL/min    Comment: (NOTE) Calculated using the CKD-EPI Creatinine Equation (2021)    Anion gap 9 5 - 15    Comment: Performed at [facility], [address] [place], [place] [address]  Magnesium     Status: None   Collection Time: 06/12/23  5:09 PM  Result Value Ref Range   Magnesium 2.0 1.7 - 2.4 mg/dL    Comment: Performed at [facility], [address], [place], [place] [address]   CT ABDOMEN PELVIS W CONTRAST  Result Date: 06/12/2023 CLINICAL DATA:  Left lower quadrant abdominal pain. EXAM: CT ABDOMEN AND PELVIS WITH CONTRAST TECHNIQUE: Multidetector CT imaging of the abdomen and pelvis was performed using the standard protocol following bolus administration of intravenous contrast. RADIATION DOSE REDUCTION: This exam was performed according to the departmental dose-optimization program which includes automated exposure control, adjustment of the mA and/or kV according to patient size and/or use of iterative reconstruction technique. CONTRAST:  80mL OMNIPAQUE IOHEXOL 300 MG/ML  SOLN COMPARISON:  CT dated 08/30/2022. FINDINGS: Evaluation is limited due to streak artifact caused by right hip arthroplasty and spinal fusion hardware. Lower chest: The visualized lung bases are clear. There is calcification of the mitral annulus. Aortic valve repair. No intra-abdominal free air or free fluid. Hepatobiliary: The liver is unremarkable. No biliary dilatation. The  gallbladder is unremarkable. Pancreas: Unremarkable. No pancreatic ductal dilatation or surrounding inflammatory changes. Spleen: Normal in size without focal abnormality. Adrenals/Urinary Tract: The adrenal glands are unremarkable. There is a 1.5 cm left renal upper pole cyst. There is no hydronephrosis on either side. There is symmetric enhancement and excretion of contrast by both kidneys. The visualized ureters and urinary bladder appear unremarkable. Stomach/Bowel: There is diffuse inflammatory changes and thickening of the descending and sigmoid colon consistent with colitis. There is sigmoid diverticulosis. No bowel obstruction. The appendix is not visualized with certainty. No inflammatory changes identified in the right lower quadrant. Vascular/Lymphatic: Moderate aortoiliac atherosclerotic disease. The IVC is unremarkable. No free gas. There is no adenopathy. Reproductive: Hysterectomy. Other: None Musculoskeletal: Osteopenia with degenerative changes of the spine. Total right hip arthroplasty and lower lumbar posterior fusion. No acute osseous pathology. IMPRESSION: 1. Distal colitis.  No bowel obstruction. 2. Sigmoid diverticulosis. 3.  Aortic Atherosclerosis (ICD10-I70.0). Electronically Signed   By: Elgie Collard M.D.   On: 06/12/2023 18:43    Pending Labs Unresulted Labs (From admission, onward)     Start     Ordered   06/13/23 0500  CBC with Differential/Platelet  Tomorrow morning,   R        06/12/23 1944   06/13/23 0500  Comprehensive metabolic panel  Tomorrow morning,   R        06/12/23 1944   06/13/23 0500  Magnesium  Tomorrow morning,   R        06/12/23 1944   06/12/23 1910  C Difficile Quick Screen w PCR reflex  (C Difficile quick screen w PCR reflex panel )  Once, for 24 hours,  URGENT       References:    CDiff Information Tool   06/12/23 1909   06/12/23 1910  Gastrointestinal Panel by PCR , Stool  (Gastrointestinal Panel by PCR, Stool                                                                                                                                                      **Does Not include CLOSTRIDIUM DIFFICILE testing. **If CDIFF testing is needed, place order from the "C Difficile Testing" order set.**)  Once,   URGENT        06/12/23 1909            Vitals/Pain Today's Vitals   06/12/23 1613 06/12/23 1615 06/12/23 1617  BP:  (!) 114/43   Pulse:  85   Resp:  20   Temp:   (!) 97.2 F (36.2 C)  TempSrc:   Oral  SpO2:  95%   PainSc: 0-No pain      Isolation Precautions Enteric precautions (UV disinfection)  Medications Medications  0.9 %  sodium chloride infusion ( Intravenous New Bag/Given 06/12/23 1721)  acetaminophen (TYLENOL) tablet 650 mg (has no administration in time range)    Or  acetaminophen (TYLENOL) suppository 650 mg (has no administration in time range)  melatonin tablet 3 mg (has no administration in time range)  ondansetron (ZOFRAN) injection 4 mg (has no administration in time range)  naloxone (NARCAN) injection 0.4 mg (has no administration in time range)  fentaNYL (SUBLIMAZE) injection 25 mcg (has no administration in time range)  iohexol (OMNIPAQUE) 300 MG/ML solution 80 mL (80 mLs Intravenous Contrast Given 06/12/23 1812)    Mobility walks with device

## 2023-06-13 DIAGNOSIS — A084 Viral intestinal infection, unspecified: Secondary | ICD-10-CM | POA: Diagnosis not present

## 2023-06-13 LAB — URINALYSIS, COMPLETE (UACMP) WITH MICROSCOPIC
Bacteria, UA: NONE SEEN
Bilirubin Urine: NEGATIVE
Glucose, UA: NEGATIVE mg/dL
Hgb urine dipstick: NEGATIVE
Ketones, ur: NEGATIVE mg/dL
Leukocytes,Ua: NEGATIVE
Nitrite: NEGATIVE
Protein, ur: NEGATIVE mg/dL
Specific Gravity, Urine: 1.027 (ref 1.005–1.030)
pH: 5 (ref 5.0–8.0)

## 2023-06-13 LAB — MAGNESIUM: Magnesium: 1.8 mg/dL (ref 1.7–2.4)

## 2023-06-13 LAB — COMPREHENSIVE METABOLIC PANEL
ALT: 23 U/L (ref 0–44)
AST: 24 U/L (ref 15–41)
Albumin: 3.3 g/dL — ABNORMAL LOW (ref 3.5–5.0)
Alkaline Phosphatase: 117 U/L (ref 38–126)
Anion gap: 8 (ref 5–15)
BUN: 30 mg/dL — ABNORMAL HIGH (ref 8–23)
CO2: 22 mmol/L (ref 22–32)
Calcium: 8.5 mg/dL — ABNORMAL LOW (ref 8.9–10.3)
Chloride: 102 mmol/L (ref 98–111)
Creatinine, Ser: 0.93 mg/dL (ref 0.44–1.00)
GFR, Estimated: 58 mL/min — ABNORMAL LOW (ref >60)
Glucose, Bld: 91 mg/dL (ref 70–99)
Potassium: 3.8 mmol/L (ref 3.5–5.1)
Sodium: 132 mmol/L — ABNORMAL LOW (ref 135–145)
Total Bilirubin: 0.6 mg/dL (ref 0.3–1.2)
Total Protein: 5.7 g/dL — ABNORMAL LOW (ref 6.5–8.1)

## 2023-06-13 LAB — CBC WITH DIFFERENTIAL/PLATELET
Abs Immature Granulocytes: 0.04 10*3/uL (ref 0.00–0.07)
Basophils Absolute: 0 10*3/uL (ref 0.0–0.1)
Basophils Relative: 0 %
Eosinophils Absolute: 0 10*3/uL (ref 0.0–0.5)
Eosinophils Relative: 0 %
HCT: 30.4 % — ABNORMAL LOW (ref 36.0–46.0)
Hemoglobin: 9.9 g/dL — ABNORMAL LOW (ref 12.0–15.0)
Immature Granulocytes: 0 %
Lymphocytes Relative: 15 %
Lymphs Abs: 1.4 10*3/uL (ref 0.7–4.0)
MCH: 30.1 pg (ref 26.0–34.0)
MCHC: 32.6 g/dL (ref 30.0–36.0)
MCV: 92.4 fL (ref 80.0–100.0)
Monocytes Absolute: 0.7 10*3/uL (ref 0.1–1.0)
Monocytes Relative: 7 %
Neutro Abs: 7.2 10*3/uL (ref 1.7–7.7)
Neutrophils Relative %: 78 %
Platelets: 200 10*3/uL (ref 150–400)
RBC: 3.29 MIL/uL — ABNORMAL LOW (ref 3.87–5.11)
RDW: 15.4 % (ref 11.5–15.5)
WBC: 9.4 10*3/uL (ref 4.0–10.5)
nRBC: 0 % (ref 0.0–0.2)

## 2023-06-13 LAB — SODIUM, URINE, RANDOM: Sodium, Ur: 25 mmol/L

## 2023-06-13 LAB — CREATININE, URINE, RANDOM: Creatinine, Urine: 66 mg/dL

## 2023-06-13 LAB — OSMOLALITY: Osmolality: 291 mOsm/kg (ref 275–295)

## 2023-06-13 LAB — OSMOLALITY, URINE: Osmolality, Ur: 446 mOsm/kg (ref 300–900)

## 2023-06-13 NOTE — Discharge Instructions (Signed)
Follow with Polite, Ronald, MD in 5-7 days ° °Please get a complete blood count and chemistry panel checked by your Primary MD at your next visit, and again as instructed by your Primary MD. Please get your medications reviewed and adjusted by your Primary MD. ° °Please request your Primary MD to go over all Hospital Tests and Procedure/Radiological results at the follow up, please get all Hospital records sent to your Prim MD by signing hospital release before you go home. ° °In some cases, there will be blood work, cultures and biopsy results pending at the time of your discharge. Please request that your primary care M.D. goes through all the records of your hospital data and follows up on these results. ° °If you had Pneumonia of Lung problems at the Hospital: °Please get a 2 view Chest X ray done in 6-8 weeks after hospital discharge or sooner if instructed by your Primary MD. ° °If you have Congestive Heart Failure: °Please call your Cardiologist or Primary MD anytime you have any of the following symptoms:  °1) 3 pound weight gain in 24 hours or 5 pounds in 1 week  °2) shortness of breath, with or without a dry hacking cough  °3) swelling in the hands, feet or stomach  °4) if you have to sleep on extra pillows at night in order to breathe ° °Follow cardiac low salt diet and 1.5 lit/day fluid restriction. ° °If you have diabetes °Accuchecks 4 times/day, Once in AM empty stomach and then before each meal. °Log in all results and show them to your primary doctor at your next visit. °If any glucose reading is under 80 or above 300 call your primary MD immediately. ° °If you have Seizure/Convulsions/Epilepsy: °Please do not drive, operate heavy machinery, participate in activities at heights or participate in high speed sports until you have seen by Primary MD or a Neurologist and advised to do so again. °Per [place] DMV statutes, patients with seizures are not allowed to drive until they have been  seizure-free for six months.  °Use caution when using heavy equipment or power tools. Avoid working on ladders or at heights. Take showers instead of baths. Ensure the water temperature is not too high on the home water heater. Do not go swimming alone. Do not lock yourself in a room alone (i.e. bathroom). When caring for infants or small children, sit down when holding, feeding, or changing them to minimize risk of injury to the child in the event you have a seizure. Maintain good sleep hygiene. Avoid alcohol.  ° °If you had Gastrointestinal Bleeding: °Please ask your Primary MD to check a complete blood count within one week of discharge or at your next visit. Your endoscopic/colonoscopic biopsies that are pending at the time of discharge, will also need to followed by your Primary MD. ° °Get Medicines reviewed and adjusted. °Please take all your medications with you for your next visit with your Primary MD ° °Please request your Primary MD to go over all hospital tests and procedure/radiological results at the follow up, please ask your Primary MD to get all Hospital records sent to his/her office. ° °If you experience worsening of your admission symptoms, develop shortness of breath, life threatening emergency, suicidal or homicidal thoughts you must seek medical attention immediately by calling 911 or calling your MD immediately  if symptoms less severe. ° °You must read complete instructions/literature along with all the possible adverse reactions/side effects for all the Medicines you take   and that have been prescribed to you. Take any new Medicines after you have completely understood and accpet all the possible adverse reactions/side effects.  ° °Do not drive or operate heavy machinery when taking Pain medications.  ° °Do not take more than prescribed Pain, Sleep and Anxiety Medications ° °Special Instructions: If you have smoked or chewed Tobacco  in the last 2 yrs please stop smoking, stop any regular  Alcohol  and or any Recreational drug use. ° °Wear Seat belts while driving. ° °Please note °You were cared for by a hospitalist during your hospital stay. If you have any questions about your discharge medications or the care you received while you were in the hospital after you are discharged, you can call the unit and asked to speak with the hospitalist on call if the hospitalist that took care of you is not available. Once you are discharged, your primary care physician will handle any further medical issues. Please note that NO REFILLS for any discharge medications will be authorized once you are discharged, as it is imperative that you return to your primary care physician (or establish a relationship with a primary care physician if you do not have one) for your aftercare needs so that they can reassess your need for medications and monitor your lab values. ° °You can reach the hospitalist office at phone [phone] or fax [phone] °  °If you do not have a primary care physician, you can call [phone] for a physician referral. ° °Activity: As tolerated with Full fall precautions use walker/cane & assistance as needed ° °  °Diet: regular ° °Disposition Home  ° ° °

## 2023-06-13 NOTE — Progress Notes (Signed)
Mobility Specialist - Progress Note   06/13/23 1056  Mobility  Activity Ambulated with assistance in hallway  Level of Assistance Standby assist, set-up cues, supervision of patient - no hands on  Assistive Device Front wheel walker  Distance Ambulated (ft) 130 ft  Range of Motion/Exercises Active  Activity Response Tolerated well  Mobility Referral Yes  $Mobility charge 1 Mobility  Mobility Specialist Start Time (ACUTE ONLY) 1043  Mobility Specialist Stop Time (ACUTE ONLY) 1055  Mobility Specialist Time Calculation (min) (ACUTE ONLY) 12 min   Pt received in bed and agreed to mobility. Was Min A for STS. Pt returned to restroom with all needs met, educated on calling out for assistance when through with rest room.   Marilynne Halsted Mobility Specialist

## 2023-06-13 NOTE — Discharge Summary (Signed)
Physician Discharge Summary  Barbara Russell ZOX:[id number] DOB: 02/21/1930 DOA: 06/12/2023  PCP: Renford Dills, MD  Admit date: 06/12/2023 Discharge date: 06/13/2023  Admitted From: ILF Disposition:  ILF  Recommendations for Outpatient Follow-up:  Follow up with PCP in 1-2 weeks  Home Health: none Equipment/Devices: none  Discharge Condition: stable CODE STATUS: Full code Diet Orders (From admission, onward)     Start     Ordered   06/12/23 1944  Diet regular Room service appropriate? Yes; Fluid consistency: Thin  Diet effective now       Question Answer Comment  Room service appropriate? Yes   Fluid consistency: Thin      06/12/23 1943            HPI: Per admitting MD,  Barbara Russell is a 89 y.o. female with medical history significant for diverticulitis, essential hypertension, severe aortic stenosis status post TAVR in October 2023, chronic diastolic heart failure, allergic rhinitis, who is admitted to [facility] on 06/12/2023 with suspected gastroenterocolitis after presenting from [facility] to [facility] ED complaining of diarrhea.   Hospital Course / Discharge diagnoses: Principal Problem:   Gastroenteritis and colitis, viral Active Problems:   Essential hypertension   Hyponatremia   Nausea   Acute prerenal azotemia   Leukocytosis   AKI (acute kidney injury) (HCC)   Chronic diastolic CHF (congestive heart failure) (HCC)   Allergic rhinitis   Principal problem Gastroenteritis -patient was admitted to the hospital with a self-limiting episode of gastroenteritis shortly after breakfast the day prior to admission.  Possible related to food poisoning as well versus viral illness.  Upon admission she is improved, diarrhea has completely resolved, she is tolerating a regular diet and will be discharged home in stable condition.  C. difficile and GI pathogen panel could not be sent since her diarrhea resolved  Active problems Hyponatremia-due to  dehydration, sodium improved with fluids Essential hypertension-continue home medications Chronic diastolic CHF-euvolemic, continue home medications   Sepsis ruled out   Discharge Instructions   Allergies as of 06/13/2023       Reactions   Amlodipine Swelling   LEE with 5mg  dose Foot swelling   Benadryl [diphenhydramine Hcl (sleep)] Other (See Comments)   Hyperstimulation   Broccoli Flavor [flavoring Agent] Diarrhea   Brussels Sprouts [brassica Oleracea] Diarrhea   Cabbage Diarrhea   Ciprofloxacin Hives   Collard Greens [wild Lettuce Extract (lactuca Virosa)] Diarrhea   Gluten Meal    Lactose Intolerance (gi) Diarrhea   Macrobid [nitrofurantoin Monohyd Macro] Nausea And Vomiting   Naproxen Other (See Comments)   Stomach pain   Quinine Sulfate [quinine] Hives   Rosuvastatin    headache   Sulfa Antibiotics Hives      Tramadol Other (See Comments)   unknown to patient   Penicillins Rash   Has patient had a PCN reaction causing immediate rash, facial/tongue/throat swelling, SOB or lightheadedness with hypotension: Yes Has patient had a PCN reaction causing severe rash involving mucus membranes or skin necrosis: No Has patient had a PCN reaction that required hospitalization: No Has patient had a PCN reaction occurring within the last 10 years: No If all of the above answers are "NO", then may proceed with Cephalosporin use. Tolerated Cephalosporin Date: 05/19/22.   Prednisone Other (See Comments)   Weakness and pain in joints        Medication List     TAKE these medications    acetaminophen 325 MG tablet Commonly known as: TYLENOL Take  650 mg by mouth every 6 (six) hours as needed for moderate pain or mild pain.   aspirin EC 81 MG tablet Take 81 mg by mouth daily. Swallow whole.   cetirizine 10 MG tablet Commonly known as: ZYRTEC Take 10 mg by mouth daily as needed for allergies or rhinitis.   CRANBERRY PO Take 1 tablet by mouth at bedtime.   fluticasone  50 MCG/ACT nasal spray Commonly known as: FLONASE Place 2 sprays into both nostrils daily.   furosemide 20 MG tablet Commonly known as: LASIX TAKE 1 TABLET BY MOUTH EVERY DAY   gabapentin 300 MG capsule Commonly known as: NEURONTIN Take 300 mg by mouth 2 (two) times daily.   HYDROcodone-acetaminophen 5-325 MG tablet Commonly known as: NORCO/VICODIN Take 1-2 tablets by mouth every 6 (six) hours as needed for severe pain.   losartan 50 MG tablet Commonly known as: COZAAR TAKE 1 TABLET BY MOUTH TWICE A DAY   methocarbamol 500 MG tablet Commonly known as: ROBAXIN Take 1 tablet (500 mg total) by mouth every 6 (six) hours as needed for muscle spasms.   nitroGLYCERIN 0.4 MG SL tablet Commonly known as: NITROSTAT PLACE 1 TABLET UNDER THE TONGUE EVERY 5 MINUTES AS NEEDED FOR CHEST PAIN. What changed: See the new instructions.   ondansetron 4 MG tablet Commonly known as: ZOFRAN Take 4 mg by mouth every 4 (four) hours as needed for vomiting or nausea.   OneTouch Ultra test strip Generic drug: glucose blood CHECK ONCE DAILY AS DIRECTED   POTASSIUM BICARBONATE PO Take 1 tablet by mouth every other day.   PRESERVISION AREDS 2 PO Take 1 capsule by mouth daily.   saccharomyces boulardii 250 MG capsule Commonly known as: FLORASTOR Take 500 mg by mouth daily in the afternoon.   Salonpas 3.12-16-08 % Ptch Generic drug: Camphor-Menthol-Methyl Sal Place 1 patch onto the skin daily as needed (pain).   spironolactone 25 MG tablet Commonly known as: ALDACTONE Take 1 tablet (25 mg total) by mouth daily.   SYSTANE OP Place 1 drop into both eyes in the morning and at bedtime.   zolpidem 5 MG tablet Commonly known as: AMBIEN Take 7.5 mg by mouth at bedtime.       Consultations: none  Procedures/Studies:  CT ABDOMEN PELVIS W CONTRAST  Result Date: 06/12/2023 CLINICAL DATA:  Left lower quadrant abdominal pain. EXAM: CT ABDOMEN AND PELVIS WITH CONTRAST TECHNIQUE: Multidetector  CT imaging of the abdomen and pelvis was performed using the standard protocol following bolus administration of intravenous contrast. RADIATION DOSE REDUCTION: This exam was performed according to the departmental dose-optimization program which includes automated exposure control, adjustment of the mA and/or kV according to patient size and/or use of iterative reconstruction technique. CONTRAST:  80mL OMNIPAQUE IOHEXOL 300 MG/ML  SOLN COMPARISON:  CT dated 08/30/2022. FINDINGS: Evaluation is limited due to streak artifact caused by right hip arthroplasty and spinal fusion hardware. Lower chest: The visualized lung bases are clear. There is calcification of the mitral annulus. Aortic valve repair. No intra-abdominal free air or free fluid. Hepatobiliary: The liver is unremarkable. No biliary dilatation. The gallbladder is unremarkable. Pancreas: Unremarkable. No pancreatic ductal dilatation or surrounding inflammatory changes. Spleen: Normal in size without focal abnormality. Adrenals/Urinary Tract: The adrenal glands are unremarkable. There is a 1.5 cm left renal upper pole cyst. There is no hydronephrosis on either side. There is symmetric enhancement and excretion of contrast by both kidneys. The visualized ureters and urinary bladder appear unremarkable. Stomach/Bowel: There is diffuse inflammatory  changes and thickening of the descending and sigmoid colon consistent with colitis. There is sigmoid diverticulosis. No bowel obstruction. The appendix is not visualized with certainty. No inflammatory changes identified in the right lower quadrant. Vascular/Lymphatic: Moderate aortoiliac atherosclerotic disease. The IVC is unremarkable. No free gas. There is no adenopathy. Reproductive: Hysterectomy. Other: None Musculoskeletal: Osteopenia with degenerative changes of the spine. Total right hip arthroplasty and lower lumbar posterior fusion. No acute osseous pathology. IMPRESSION: 1. Distal colitis.  No bowel  obstruction. 2. Sigmoid diverticulosis. 3.  Aortic Atherosclerosis (ICD10-I70.0). Electronically Signed   By: Elgie Collard M.D.   On: 06/12/2023 18:43     Subjective: - no chest pain, shortness of breath, no abdominal pain, nausea or vomiting.   Discharge Exam: BP (!) 113/45 (BP Location: Left Arm)   Pulse 73   Temp 98.6 F (37 C) (Oral)   Resp 16   Ht 5\' 2"  (1.575 m)   Wt 62 kg   LMP  (LMP Unknown)   SpO2 97%   BMI 25.00 kg/m   General: Pt is alert, awake, not in acute distress Cardiovascular: RRR, S1/S2 +, no rubs, no gallops Respiratory: CTA bilaterally, no wheezing, no rhonchi Abdominal: Soft, NT, ND, bowel sounds + Extremities: no edema, no cyanosis    The results of significant diagnostics from this hospitalization (including imaging, microbiology, ancillary and laboratory) are listed below for reference.     Microbiology: No results found for this or any previous visit (from the past 240 hour(s)).   Labs: Basic Metabolic Panel: Recent Labs  Lab 06/12/23 1709 06/13/23 0544  NA 129* 132*  K 4.1 3.8  CL 96* 102  CO2 24 22  GLUCOSE 195* 91  BUN 39* 30*  CREATININE 1.29* 0.93  CALCIUM 9.2 8.5*  MG 2.0 1.8   Liver Function Tests: Recent Labs  Lab 06/12/23 1709 06/13/23 0544  AST 38 24  ALT 30 23  ALKPHOS 161* 117  BILITOT 0.7 0.6  PROT 6.9 5.7*  ALBUMIN 4.0 3.3*   CBC: Recent Labs  Lab 06/12/23 1709 06/13/23 0544  WBC 16.0* 9.4  NEUTROABS 14.3* 7.2  HGB 12.1 9.9*  HCT 36.9 30.4*  MCV 92.3 92.4  PLT 216 200   CBG: Recent Labs  Lab 06/12/23 1615  GLUCAP 179*   Hgb A1c No results for input(s): "HGBA1C" in the last 72 hours. Lipid Profile No results for input(s): "CHOL", "HDL", "LDLCALC", "TRIG", "CHOLHDL", "LDLDIRECT" in the last 72 hours. Thyroid function studies Recent Labs    06/12/23 2118  TSH 1.676   Urinalysis    Component Value Date/Time   COLORURINE YELLOW 06/13/2023 0433   APPEARANCEUR CLEAR 06/13/2023 0433    LABSPEC 1.027 06/13/2023 0433   PHURINE 5.0 06/13/2023 0433   GLUCOSEU NEGATIVE 06/13/2023 0433   HGBUR NEGATIVE 06/13/2023 0433   BILIRUBINUR NEGATIVE 06/13/2023 0433   KETONESUR NEGATIVE 06/13/2023 0433   PROTEINUR NEGATIVE 06/13/2023 0433   UROBILINOGEN 0.2 12/02/2009 0805   NITRITE NEGATIVE 06/13/2023 0433   LEUKOCYTESUR NEGATIVE 06/13/2023 0433    FURTHER DISCHARGE INSTRUCTIONS:   Get Medicines reviewed and adjusted: Please take all your medications with you for your next visit with your Primary MD   Laboratory/radiological data: Please request your Primary MD to go over all hospital tests and procedure/radiological results at the follow up, please ask your Primary MD to get all Hospital records sent to his/her office.   In some cases, they will be blood work, cultures and biopsy results pending at the time  of your discharge. Please request that your primary care M.D. goes through all the records of your hospital data and follows up on these results.   Also Note the following: If you experience worsening of your admission symptoms, develop shortness of breath, life threatening emergency, suicidal or homicidal thoughts you must seek medical attention immediately by calling 911 or calling your MD immediately  if symptoms less severe.   You must read complete instructions/literature along with all the possible adverse reactions/side effects for all the Medicines you take and that have been prescribed to you. Take any new Medicines after you have completely understood and accpet all the possible adverse reactions/side effects.    Do not drive when taking Pain medications or sleeping medications (Benzodaizepines)   Do not take more than prescribed Pain, Sleep and Anxiety Medications. It is not advisable to combine anxiety,sleep and pain medications without talking with your primary care practitioner   Special Instructions: If you have smoked or chewed Tobacco  in the last 2 yrs please  stop smoking, stop any regular Alcohol  and or any Recreational drug use.   Wear Seat belts while driving.   Please note: You were cared for by a hospitalist during your hospital stay. Once you are discharged, your primary care physician will handle any further medical issues. Please note that NO REFILLS for any discharge medications will be authorized once you are discharged, as it is imperative that you return to your primary care physician (or establish a relationship with a primary care physician if you do not have one) for your post hospital discharge needs so that they can reassess your need for medications and monitor your lab values.  Time coordinating discharge: 35 minutes  SIGNED:  Pamella Pert, MD, PhD 06/13/2023, 12:18 PM

## 2023-06-13 NOTE — TOC Transition Note (Signed)
Transition of Care [facility]) - CM/SW Discharge Note   Patient Details  Name: Barbara Russell MRN: [id number] Date of Birth: 1930-08-17  [facility] CM/SW Contact:  Otelia Santee, LCSW Phone Number: 06/13/2023, 12:58 PM   Clinical Narrative:    Spoke with pt via t/c to room. Pt resides at ILF at [facility]. Pt has wheelchair and RW. Pt denies further DME needs. Pt's son to transport pt back to apartment. No TOC identified.    Final next level of care: Home/Self Care Barriers to Discharge: No Barriers Identified   Patient Goals and CMS Choice CMS Medicare.gov Compare Post Acute Care list provided to:: Patient Choice offered to / list presented to : Patient  Discharge Placement                  Patient to be transferred to facility by: Family Name of family member notified: Patient Patient and family notified of of transfer: 06/13/23  Discharge Plan and Services Additional resources added to the After Visit Summary for                  DME Arranged: N/A DME Agency: NA                  Social Determinants of Health (SDOH) Interventions SDOH Screenings   Food Insecurity: No Food Insecurity (06/12/2023)  Housing: Low Risk  (06/12/2023)  Transportation Needs: No Transportation Needs (06/12/2023)  Utilities: Not At Risk (06/12/2023)  Tobacco Use: Medium Risk (06/12/2023)     Readmission Risk Interventions     No data to display

## 2023-06-13 NOTE — Progress Notes (Signed)
Pt discharged to home. DC instructions given with son at bedside. No concerns voiced. Pt left unit in wheelchair pushed by nurse tech. Left in stable condition.  °

## 2023-06-13 NOTE — Plan of Care (Signed)
Pt alert and oriented x 4. Up 1 assist with walker to bsc. Pt reports continued weakness. Urine sent this am. No bm since arrival to unit. Pt takes meds whole. Tolerating po intake. IV fluids continue.  Problem: Education: Goal: Knowledge of General Education information will improve Description: Including pain rating scale, medication(s)/side effects and non-pharmacologic comfort measures Outcome: Progressing   Problem: Health Behavior/Discharge Planning: Goal: Ability to manage health-related needs will improve Outcome: Progressing   Problem: Clinical Measurements: Goal: Ability to maintain clinical measurements within normal limits will improve Outcome: Progressing Goal: Will remain free from infection Outcome: Progressing Goal: Diagnostic test results will improve Outcome: Progressing Goal: Respiratory complications will improve Outcome: Progressing Goal: Cardiovascular complication will be avoided Outcome: Progressing   Problem: Activity: Goal: Risk for activity intolerance will decrease Outcome: Progressing   Problem: Nutrition: Goal: Adequate nutrition will be maintained Outcome: Progressing   Problem: Coping: Goal: Level of anxiety will decrease Outcome: Progressing   Problem: Elimination: Goal: Will not experience complications related to bowel motility Outcome: Progressing Goal: Will not experience complications related to urinary retention Outcome: Progressing   Problem: Pain Managment: Goal: General experience of comfort will improve Outcome: Progressing   Problem: Safety: Goal: Ability to remain free from injury will improve Outcome: Progressing   Problem: Skin Integrity: Goal: Risk for impaired skin integrity will decrease Outcome: Progressing

## 2023-06-19 NOTE — Telephone Encounter (Signed)
Called and spoke to patient regarding her decision to cancel the CT chest. Patient feels she does not need anymore and does not WANT anymore scans. Patient states she remembers Dr. Tonia Brooms telling her at the March 2024 visit that her "nodules were small and nothing to worry about". Advised patient that there was a slight growth in one of her nodules and the CT would be beneficial to re-evaluate the nodule, patient refused and was firm in her decision with not wanting another CT chest. Advised patient the appointment with Dr. Tonia Brooms would essentially be moot since she wouldn't have the CT to review. Patient states she would keep the appointment if Dr. Tonia Brooms wanted her to but would like to cancel if the appointment is not warranted. Advised patient we could cancel the appt with Dr. Tonia Brooms but she could call back to schedule a visit if needed or if she changed her mind regarding the CT chest. Patient was extremely grateful for the call and information provided. Patient states she feels great regarding her breathing and will call us back if she feels she needs. Will forward to Dr. Tonia Brooms as Lorain Childes.

## 2023-07-02 ENCOUNTER — Other Ambulatory Visit: Payer: Self-pay | Admitting: Family

## 2023-07-02 DIAGNOSIS — N6320 Unspecified lump in the left breast, unspecified quadrant: Secondary | ICD-10-CM

## 2023-07-02 DIAGNOSIS — L89309 Pressure ulcer of unspecified buttock, unspecified stage: Secondary | ICD-10-CM | POA: Diagnosis not present

## 2023-07-02 DIAGNOSIS — N898 Other specified noninflammatory disorders of vagina: Secondary | ICD-10-CM | POA: Diagnosis not present

## 2023-07-09 ENCOUNTER — Ambulatory Visit
Admission: RE | Admit: 2023-07-09 | Discharge: 2023-07-09 | Disposition: A | Discharge status: Home / Self Care | Payer: Medicare Other | Source: Ambulatory Visit | Attending: Family | Admitting: Family

## 2023-07-09 DIAGNOSIS — N6320 Unspecified lump in the left breast, unspecified quadrant: Secondary | ICD-10-CM

## 2023-07-09 DIAGNOSIS — N6325 Unspecified lump in the left breast, overlapping quadrants: Secondary | ICD-10-CM | POA: Diagnosis not present

## 2023-07-10 ENCOUNTER — Other Ambulatory Visit (HOSPITAL_BASED_OUTPATIENT_CLINIC_OR_DEPARTMENT_OTHER): Payer: Medicare Other

## 2023-08-20 ENCOUNTER — Other Ambulatory Visit (HOSPITAL_BASED_OUTPATIENT_CLINIC_OR_DEPARTMENT_OTHER): Payer: Medicare Other

## 2023-08-22 ENCOUNTER — Other Ambulatory Visit (HOSPITAL_BASED_OUTPATIENT_CLINIC_OR_DEPARTMENT_OTHER): Payer: Medicare Other

## 2023-08-24 ENCOUNTER — Other Ambulatory Visit: Payer: Self-pay

## 2023-08-24 DIAGNOSIS — K121 Other forms of stomatitis: Secondary | ICD-10-CM | POA: Diagnosis not present

## 2023-08-24 MED ORDER — NYSTATIN 100000 UNIT/ML MT SUSP
5.0000 mL | OROMUCOSAL | 1 refills | Status: AC | PRN
  Filled 2023-08-24: qty 120, 2d supply, fill #0

## 2023-08-25 ENCOUNTER — Other Ambulatory Visit: Payer: Self-pay | Admitting: Cardiology

## 2023-09-02 DIAGNOSIS — Z23 Encounter for immunization: Secondary | ICD-10-CM | POA: Diagnosis not present

## 2023-09-03 ENCOUNTER — Ambulatory Visit: Payer: Medicare Other | Admitting: Pulmonary Disease

## 2023-09-16 DIAGNOSIS — Z23 Encounter for immunization: Secondary | ICD-10-CM | POA: Diagnosis not present

## 2023-09-22 ENCOUNTER — Other Ambulatory Visit: Payer: Self-pay | Admitting: Cardiology

## 2023-09-27 ENCOUNTER — Telehealth: Payer: Self-pay | Admitting: Pharmacist

## 2023-09-27 NOTE — Telephone Encounter (Signed)
Patient called to report increased swelling in feet. She has been taking furosemide 20mg  daily. States she has been wearing compressions stockings. Raises feet sometimes during the day.  Advised to take 30mg  of furosemide for 2 days to see if any improvement. Elevated legs while sleeping and sitting. Continue compression stockings.

## 2023-10-09 NOTE — Progress Notes (Unsigned)
[facility]   MULTIDISCIPLINARY HEART VALVE TEAM  Structural Heart Office Note:  .    Date:  10/10/2023  ID:  ERIAN MALLINGER, DOB [birth year], MRN [id number] PCP: Renford Dills, MD  [facility] Providers Cardiologist:  Donato Schultz, MD  History of Present Illness: .    Barbara Russell is a 89 y.o. female  with a hx of anxiety, breast cancer s/p mastectomy, borderline DM, HTN, HLD, hyponatremia, and severe paradoxical LFLG AS who is now s/p TAVR and is being seen today for one year follow up.   She has been followed for moderate aortic stenosis by Dr. Anne Fu and developed worsening dyspnea and chest pain. Echo 07/25/22 showed LVEF=60-65%, moderate LVH, mild MR and paradoxical LFLG AS with mean gradient 29 mmHg, peak gradient 51.3 mmHg, AVA 0.74 cm2, DI 0.24. [facility] 08/17/22 showed mild non obst CAD and normal heart pressures.    She was evaluated by the multidisciplinary valve team and felt to have severe, symptomatic aortic stenosis and is now s/p TAVR with a 23 mm Edwards Sapien 3 Ultra Resilia THV via the TF approach on 10/10/22. Post procedure she had issues with GI bleeding however this has since resolved. She underwent hip replacement earlier this year and has done well with this. In regards to pulmonary nodule, she was seen by Dr. Tonia Brooms with no significant changes on CT however plan to follow with his team in 6 months which is coming up. She does report more recent episodes of abrupt presyncope symptoms that occur typically while sitting and last a brief 10-15 seconds then resolve. She has no pain, SOB, or palpations with this. Episodes happen about once per week. Offered ZIO monitor and initially the patient deferred however is curious and is now agreeable. Otherwise, she denies chest pain, SOB, palpitations, LE edema, orthopnea, PND, dizziness, or frank syncope. Denies bleeding in stool or urine.    Physical Exam:    VS:  BP (!) 140/60   Pulse 60   Ht 5\' 2"   (1.575 m)   Wt 135 lb 3.2 oz (61.3 kg)   LMP  (LMP Unknown)   SpO2 97%   BMI 24.73 kg/m    Wt Readings from Last 3 Encounters:  10/10/23 135 lb 3.2 oz (61.3 kg)  06/12/23 136 lb 11 oz (62 kg)  04/20/23 130 lb (59 kg)    General: Well developed, well nourished, NAD Lungs:Clear to ausculation bilaterally. No wheezes, rales, or rhonchi. Breathing is unlabored. Cardiovascular: RRR with S1 S2. No soft murmur Extremities: Trace ankle edema.  Neuro: Alert and oriented. No focal deficits. No facial asymmetry. MAE spontaneously. Psych: Responds to questions appropriately with normal affect.    ASSESSMENT AND PLAN: .    Severe AS: Patient doing well with NYHA class I symptoms s/p successful TAVR with a 23 mm Edwards Sapien 3 Ultra Resilia THV via the TF approach on 10/10/22. Echocardiogram today with normal LVEF at 60-65% with mild MR/moderate MAC and stable TAVR valve function with 23mm S3UR with a mean gradient at 11.32mmHg, DI at 0.43, and AVA 1.33cm2 with mild PVL.  Continue ASA monotherapy. Requires lifelong dental SBE with Amoxicillin. Plan ZIO follow up with myself as below and Dr. Anne Fu in the interm.   Presyncope/dizziness: Recent episodes of presyncope while sitting. Episodes are brief lasting only 10-15 seconds with no other symptoms. Unclear etiology however can r/o cardiac arrhthymias with ZIO. Plan 2 week ZIO then follow up telephone visit.  Anemia: Denies recent blood in stool or urine. Deferred CBC today given PCP appointment with full labs. Will follow.    HTN: BP well controlled. No changes today.     Pulmonary nodules: Followed by pulmonary, Dr. Tonia Brooms with plans to repeat chest CT.   Signed, Georgie Chard, NP

## 2023-10-10 ENCOUNTER — Ambulatory Visit (INDEPENDENT_AMBULATORY_CARE_PROVIDER_SITE_OTHER): Payer: Medicare Other

## 2023-10-10 ENCOUNTER — Ambulatory Visit: Payer: Medicare Other | Attending: Cardiology

## 2023-10-10 ENCOUNTER — Other Ambulatory Visit (HOSPITAL_COMMUNITY): Payer: Self-pay | Admitting: Cardiology

## 2023-10-10 ENCOUNTER — Ambulatory Visit (INDEPENDENT_AMBULATORY_CARE_PROVIDER_SITE_OTHER): Payer: Medicare Other | Admitting: Cardiology

## 2023-10-10 VITALS — BP 140/60 | HR 60 | Ht 62.0 in | Wt 135.2 lb

## 2023-10-10 DIAGNOSIS — I2583 Coronary atherosclerosis due to lipid rich plaque: Secondary | ICD-10-CM | POA: Diagnosis not present

## 2023-10-10 DIAGNOSIS — Z952 Presence of prosthetic heart valve: Secondary | ICD-10-CM

## 2023-10-10 DIAGNOSIS — R918 Other nonspecific abnormal finding of lung field: Secondary | ICD-10-CM

## 2023-10-10 DIAGNOSIS — I10 Essential (primary) hypertension: Secondary | ICD-10-CM

## 2023-10-10 DIAGNOSIS — R42 Dizziness and giddiness: Secondary | ICD-10-CM | POA: Insufficient documentation

## 2023-10-10 DIAGNOSIS — I2510 Atherosclerotic heart disease of native coronary artery without angina pectoris: Secondary | ICD-10-CM | POA: Insufficient documentation

## 2023-10-10 DIAGNOSIS — I350 Nonrheumatic aortic (valve) stenosis: Secondary | ICD-10-CM | POA: Insufficient documentation

## 2023-10-10 LAB — ECHOCARDIOGRAM COMPLETE
AR max vel: 1.19 cm2
AV Area VTI: 1.34 cm2
AV Area mean vel: 1.24 cm2
AV Mean grad: 11.8 mm[Hg]
AV Peak grad: 21.2 mm[Hg]
Ao pk vel: 2.3 m/s
Area-P 1/2: 2.15 cm2
MV VTI: 1.56 cm2
P 1/2 time: 457 ms
S' Lateral: 2.6 cm

## 2023-10-10 NOTE — Patient Instructions (Signed)
Medication Instructions:   *If you need a refill on your cardiac medications before your next appointment, please call your pharmacy*   Lab Work:  If you have labs (blood work) drawn today and your tests are completely normal, you will receive your results only by: MyChart Message (if you have MyChart) OR A paper copy in the mail If you have any lab test that is abnormal or we need to change your treatment, we will call you to review the results.   Testing/Procedures:  Barbara Russell- Long Term Monitor Instructions  Your physician has requested you wear a ZIO patch monitor for 14 days.  This is a single patch monitor. [employer] supplies one patch monitor per enrollment. Additional stickers are not available. Please do not apply patch if you will be having a Nuclear Stress Test,  Echocardiogram, Cardiac CT, MRI, or Chest Xray during the period you would be wearing the  monitor. The patch cannot be worn during these tests. You cannot remove and re-apply the  ZIO XT patch monitor.  Your ZIO patch monitor will be mailed 3 day USPS to your address on file. It may take 3-5 days  to receive your monitor after you have been enrolled.  Once you have received your monitor, please review the enclosed instructions. Your monitor  has already been registered assigning a specific monitor serial # to you.  Billing and Patient Assistance Program Information  We have supplied [employer] with any of your insurance information on file for billing purposes. [employer] offers a sliding scale Patient Assistance Program for patients that do not have  insurance, or whose insurance does not completely cover the cost of the ZIO monitor.  You must apply for the Patient Assistance Program to qualify for this discounted rate.  To apply, please call [employer] at [phone], select option 4, select option 2, ask to apply for  Patient Assistance Program. Meredeth Ide will ask your household income, and how many people  are in your  household. They will quote your out-of-pocket cost based on that information.  [employer] will also be able to set up a 75-month, interest-free payment plan if needed.  Applying the monitor   Shave hair from upper left chest.  Hold abrader disc by orange tab. Rub abrader in 40 strokes over the upper left chest as  indicated in your monitor instructions.  Clean area with 4 enclosed alcohol pads. Let dry.  Apply patch as indicated in monitor instructions. Patch will be placed under collarbone on left  side of chest with arrow pointing upward.  Rub patch adhesive wings for 2 minutes. Remove white label marked "1". Remove the white  label marked "2". Rub patch adhesive wings for 2 additional minutes.  While looking in a mirror, press and release button in center of patch. A small green light will  flash 3-4 times. This will be your only indicator that the monitor has been turned on.  Do not shower for the first 24 hours. You may shower after the first 24 hours.  Press the button if you feel a symptom. You will hear a small click. Record Date, Time and  Symptom in the Patient Logbook.  When you are ready to remove the patch, follow instructions on the last 2 pages of Patient  Logbook. Stick patch monitor onto the last page of Patient Logbook.  Place Patient Logbook in the blue and white box. Use locking tab on box and tape box closed  securely. The blue and white box has  prepaid postage on it. Please place it in the mailbox as  soon as possible. Your physician should have your test results approximately 7 days after the  monitor has been mailed back to [facility].  Call [facility] Customer Care at [phone] if you have questions regarding  your ZIO XT patch monitor. Call them immediately if you see an orange light blinking on your  monitor.  If your monitor falls off in less than 4 days, contact our Monitor department at [phone].  If your monitor becomes loose or falls off after  4 days call [employer] at [phone] for  suggestions on securing your monitor    Follow-Up: At [facility], you and your health needs are our priority.  As part of our continuing mission to provide you with exceptional heart care, we have created designated Provider Care Teams.  These Care Teams include your primary Cardiologist (physician) and Advanced Practice Providers (APPs -  Physician Assistants and Nurse Practitioners) who all work together to provide you with the care you need, when you need it.  We recommend signing up for the patient portal called "MyChart".  Sign up information is provided on this After Visit Summary.  MyChart is used to connect with patients for Virtual Visits (Telemedicine).  Patients are able to view lab/test results, encounter notes, upcoming appointments, etc.  Non-urgent messages can be sent to your provider as well.   To learn more about what you can do with MyChart, go to [email or web address].

## 2023-10-10 NOTE — Progress Notes (Unsigned)
 Enrolled patient for a 14 day Zio XT monitor to be mailed to patients home  [place] to read

## 2023-10-13 DIAGNOSIS — R42 Dizziness and giddiness: Secondary | ICD-10-CM | POA: Diagnosis not present

## 2023-10-26 ENCOUNTER — Other Ambulatory Visit: Payer: Self-pay

## 2023-11-01 DIAGNOSIS — R42 Dizziness and giddiness: Secondary | ICD-10-CM | POA: Diagnosis not present

## 2023-11-05 DIAGNOSIS — E1169 Type 2 diabetes mellitus with other specified complication: Secondary | ICD-10-CM | POA: Diagnosis not present

## 2023-11-05 DIAGNOSIS — I10 Essential (primary) hypertension: Secondary | ICD-10-CM | POA: Diagnosis not present

## 2023-11-05 DIAGNOSIS — E7800 Pure hypercholesterolemia, unspecified: Secondary | ICD-10-CM | POA: Diagnosis not present

## 2023-11-05 DIAGNOSIS — I2510 Atherosclerotic heart disease of native coronary artery without angina pectoris: Secondary | ICD-10-CM | POA: Diagnosis not present

## 2023-11-05 DIAGNOSIS — Z952 Presence of prosthetic heart valve: Secondary | ICD-10-CM | POA: Diagnosis not present

## 2023-11-05 DIAGNOSIS — G4700 Insomnia, unspecified: Secondary | ICD-10-CM | POA: Diagnosis not present

## 2023-11-05 DIAGNOSIS — Z23 Encounter for immunization: Secondary | ICD-10-CM | POA: Diagnosis not present

## 2023-11-13 NOTE — Progress Notes (Unsigned)
Virtual Visit via Telephone Note   Because of Barbara Russell's co-morbid illnesses, she is at least at moderate risk for complications without adequate follow up.  This format is felt to be most appropriate for this patient at this time.  The patient did not have access to video technology/had technical difficulties with video requiring transitioning to audio format only (telephone).  All issues noted in this document were discussed and addressed.  No physical exam could be performed with this format.  Please refer to the patient's chart for her consent to telehealth for [facility].   Date:  11/13/2023   ID:  Barbara Russell, DOB [birth year], MRN [id number] The patient was identified using 2 identifiers.  Patient Location: Home Provider Location: Office/Clinic   PCP:  Renford Dills, MD    HeartCare Providers Cardiologist:  Donato Schultz, MD     Evaluation Performed:  Follow-Up Visit  Chief Complaint:  Follow up cardiac monitor   History of Present Illness:    Barbara Russell is a 89 y.o. female with a hx of anxiety, breast cancer s/p mastectomy, borderline DM, HTN, HLD, hyponatremia, and severe paradoxical LFLG AS who is now s/p TAVR and was contacted for follow up after ZIO.    She has been followed for moderate aortic stenosis by Dr. Anne Fu and developed worsening dyspnea and chest pain. Echo 07/25/22 showed LVEF=60-65%, moderate LVH, mild MR and paradoxical LFLG AS with mean gradient 29 mmHg, peak gradient 51.3 mmHg, AVA 0.74 cm2, DI 0.24. [facility] [place] 08/17/22 showed mild non obst CAD and normal heart pressures.    She was evaluated by the multidisciplinary valve team and felt to have severe, symptomatic aortic stenosis and is now s/p TAVR with a 23 mm Edwards Sapien 3 Ultra Resilia THV via the TF approach on 10/10/22. She was seen in follow up and overall was noted to be doing well however was having very brief episodes of dizziness/lightheadedness with concern for  arrhthymias.   Preliminary reading of 2- week ZIO showed predominately NSR with 4 episodes of SVT noted (longest lasting 9 beats) and low burden of SVE couplets and triplets although also noted to have difficulty discerning atrial activity making definitive dx difficult to ascertain. Today she reports episode frequency has improved and she is not concerned. BP have remained stable. Denies chest pain, SOB, LE edema, orthopnea, PND, or syncope.   Past Medical History:  Diagnosis Date   Anemia    Breast cancer (HCC)    Hx of BC, s/p mastectomy on right 1999, s/p tamoxifen X1 year   Chest pain    EC Stress test 12/21/11 Low risk, no ischemia, normal EF   Chronic kidney disease    Coronary artery disease    Remote Hx of CAD, normal stress test 2009   Diverticulitis    GERD (gastroesophageal reflux disease)    No longer a problem   Hypercholesteremia    Hypertension    Insomnia    Lichen sclerosus    Low back pain    Macular degeneration    Myocardial infarction (HCC) 12/11/1998   Osteoarthrosis, unspecified whether generalized or localized, unspecified site    Pain    Several year Hx of pain/ SOB radiating to her neck   S/P TAVR (transcatheter aortic valve replacement) 10/10/2022   s/p TAVR with a 23mm Edwards S3UR via the TF approach by Dr. Clifton James & Dr. Delia Chimes   Severe aortic stenosis    UTI (urinary tract infection)  Past Surgical History:  Procedure Laterality Date   ABDOMINAL HYSTERECTOMY     APPENDECTOMY     BIOPSY  11/06/2022   Procedure: BIOPSY;  Surgeon: Lynann Bologna, DO;  Location: [facility] ENDOSCOPY;  Service: Gastroenterology;;   BREAST BIOPSY Left 11/14/2022   [place] LT BREAST BX W LOC DEV 1ST LESION IMG BX SPEC US GUIDE 11/14/2022 [facility]   ESOPHAGOGASTRODUODENOSCOPY N/A 11/06/2022   Procedure: ESOPHAGOGASTRODUODENOSCOPY (EGD);  Surgeon: Lynann Bologna, DO;  Location: [facility];  Service: Gastroenterology;  Laterality: N/A;   HAND SURGERY Bilateral     CTS and trigger finger on right and left   INTRAOPERATIVE TRANSTHORACIC ECHOCARDIOGRAM N/A 10/10/2022   Procedure: INTRAOPERATIVE TRANSTHORACIC ECHOCARDIOGRAM;  Surgeon: Kathleene Hazel, MD;  Location: [facility] OR;  Service: Open Heart Surgery;  Laterality: N/A;   MASTECTOMY Right 12/11/1997   REPLACEMENT TOTAL KNEE Right 11/10/2009   RIGHT HEART CATH AND CORONARY ANGIOGRAPHY N/A 08/17/2022   Procedure: RIGHT HEART CATH AND CORONARY ANGIOGRAPHY;  Surgeon: Kathleene Hazel, MD;  Location: [facility] INVASIVE [facility] LAB;  Service: Cardiovascular;  Laterality: N/A;   TOTAL HIP ARTHROPLASTY Right 12/27/2022   Procedure: TOTAL HIP ARTHROPLASTY ANTERIOR APPROACH;  Surgeon: Ollen Gross, MD;  Location: [facility] ORS;  Service: Orthopedics;  Laterality: Right;   TOTAL KNEE REVISION Right 05/17/2022   Procedure: Right knee polyethylene revision;  Surgeon: Ollen Gross, MD;  Location: [facility] ORS;  Service: Orthopedics;  Laterality: Right;   TRANSCATHETER AORTIC VALVE REPLACEMENT, TRANSFEMORAL N/A 10/10/2022   Procedure: Transcatheter Aortic Valve Replacement, Transfemoral;  Surgeon: Kathleene Hazel, MD;  Location[facility];  Service: Open Heart Surgery;  Laterality: N/A;   ULTRASOUND GUIDANCE FOR VASCULAR ACCESS  10/10/2022   Procedure: ULTRASOUND GUIDANCE FOR VASCULAR ACCESS;  Surgeon: Kathleene Hazel, MD;  Location: [facility] OR;  Service: Open Heart Surgery;;   VESICOVAGINAL FISTULA CLOSURE W/ TAH  12/12/1967     No outpatient medications have been marked as taking for the 11/14/23 encounter (Appointment) with [facility].     Allergies:   Amlodipine, Benadryl [diphenhydramine hcl (sleep)], Broccoli flavor [flavoring agent], Brussels sprouts [brassica oleracea], Cabbage, Ciprofloxacin, Collard greens [wild lettuce extract (lactuca virosa)], Gluten meal, Lactose intolerance (gi), Macrobid [nitrofurantoin monohyd macro], Naproxen, Quinine sulfate [quinine], Rosuvastatin, Sulfa antibiotics,  Tramadol, Penicillins, and Prednisone   Social History   Tobacco Use   Smoking status: Former    Current packs/day: 0.00    Types: Cigarettes    Quit date: 07/08/1992    Years since quitting: 31.3   Smokeless tobacco: Never  Vaping Use   Vaping status: Never Used  Substance Use Topics   Alcohol use: No   Drug use: No     Family Hx: The patient's family history includes Cancer in her mother; Heart disease in her father.  ROS:   Please see the history of present illness.     All other systems reviewed and are negative.  Labs/Other Tests and Data Reviewed:    EKG:  No ECG reviewed.  Recent Labs: 06/12/2023: TSH 1.676 06/13/2023: ALT 23; BUN 30; Creatinine, Ser 0.93; Hemoglobin 9.9; Magnesium 1.8; Platelets 200; Potassium 3.8; Sodium 132   Recent Lipid Panel No results found for: "CHOL", "TRIG", "HDL", "CHOLHDL", "LDLCALC", "LDLDIRECT"  Wt Readings from Last 3 Encounters:  10/10/23 135 lb 3.2 oz (61.3 kg)  06/12/23 136 lb 11 oz (62 kg)  04/20/23 130 lb (59 kg)       Objective:    Vital Signs:  LMP  (LMP  Unknown)    VITAL SIGNS:  reviewed GEN:  no acute distress  ASSESSMENT & PLAN:    Dizziness/lightheadedness: 2 week ZIO with preliminary review shows predominately NSR with 4 episodes of SVT noted (longest lasting 9 beats) and low burden of SVE couplets and triplets although also noted to have difficulty discerning atrial activity making definitive dx difficult to ascertain. Patient reports episode frequency has improved and she is no longer concerned. Several options were discussed today although we both agree to wait for final over-read interpretation. Inclination is to continue current regimen with no changes (which patient agrees).Also discussed the possibility of stopping/reducing Losartan in place of beta blocker. Patient prefers to monitor symptoms for now for increased frequency/duration. If monitor confirms AF, plan to further discuss with the patient, otherwise keep  regular follow up with Dr. Anne Fu.   Severe AS: Patient doing well with NYHA class I symptoms s/p successful TAVR with a 23 mm Edwards Sapien 3 Ultra Resilia THV via the TF approach on 10/10/22. Last echocardiogram with normal LVEF at 60-65% with mild MR/moderate MAC and stable TAVR valve function with 23mm S3UR with a mean gradient at 11.78mmHg, DI at 0.43, and AVA 1.33cm2 with mild PVL.  Continue ASA monotherapy. Requires lifelong dental SBE with Amoxicillin.     Anemia: Denies recent blood in stool or urine.    HTN: BP well controlled. No changes today.     Pulmonary nodules: Followed by pulmonary, Dr. Tonia Brooms with plans to repeat chest CT.     Time:   Today, I have spent 5 minutes with the patient with telehealth technology discussing the above problems.     Medication Adjustments/Labs and Tests Ordered: Current medicines are reviewed at length with the patient today.  Concerns regarding medicines are outlined above.   Tests Ordered: No orders of the defined types were placed in this encounter.   Medication Changes: No orders of the defined types were placed in this encounter.   Follow Up:  In Person  with Dr. Anne Fu as previously scheduled   Signed, Georgie Chard, NP  11/13/2023 1:41 PM    [facility]

## 2023-11-14 ENCOUNTER — Ambulatory Visit: Payer: Medicare Other | Attending: General Practice | Admitting: Cardiology

## 2023-11-14 VITALS — BP 128/60

## 2023-11-14 DIAGNOSIS — R918 Other nonspecific abnormal finding of lung field: Secondary | ICD-10-CM | POA: Insufficient documentation

## 2023-11-14 DIAGNOSIS — I2583 Coronary atherosclerosis due to lipid rich plaque: Secondary | ICD-10-CM | POA: Diagnosis not present

## 2023-11-14 DIAGNOSIS — R42 Dizziness and giddiness: Secondary | ICD-10-CM | POA: Diagnosis not present

## 2023-11-14 DIAGNOSIS — Z952 Presence of prosthetic heart valve: Secondary | ICD-10-CM | POA: Diagnosis not present

## 2023-11-14 DIAGNOSIS — I2510 Atherosclerotic heart disease of native coronary artery without angina pectoris: Secondary | ICD-10-CM | POA: Diagnosis not present

## 2023-11-14 NOTE — Patient Instructions (Signed)
Medication Instructions:  Your physician recommends that you continue on your current medications as directed. Please refer to the Current Medication list given to you today.  *If you need a refill on your cardiac medications before your next appointment, please call your pharmacy*  Lab Work: If you have labs (blood work) drawn today and your tests are completely normal, you will receive your results only by: MyChart Message (if you have MyChart) OR A paper copy in the mail If you have any lab test that is abnormal or we need to change your treatment, we will call you to review the results.  Follow-Up: At [facility], you and your health needs are our priority.  As part of our continuing mission to provide you with exceptional heart care, we have created designated Provider Care Teams.  These Care Teams include your primary Cardiologist (physician) and Advanced Practice Providers (APPs -  Physician Assistants and Nurse Practitioners) who all work together to provide you with the care you need, when you need it.  We recommend signing up for the patient portal called "MyChart".  Sign up information is provided on this After Visit Summary.  MyChart is used to connect with patients for Virtual Visits (Telemedicine).  Patients are able to view lab/test results, encounter notes, upcoming appointments, etc.  Non-urgent messages can be sent to your provider as well.   To learn more about what you can do with MyChart, go to [email or web address].    Your next appointment:   Next available   Provider:   Donato Schultz, MD

## 2023-12-19 ENCOUNTER — Other Ambulatory Visit (HOSPITAL_BASED_OUTPATIENT_CLINIC_OR_DEPARTMENT_OTHER): Payer: Self-pay | Admitting: Internal Medicine

## 2023-12-19 DIAGNOSIS — R1032 Left lower quadrant pain: Secondary | ICD-10-CM | POA: Diagnosis not present

## 2023-12-19 DIAGNOSIS — R109 Unspecified abdominal pain: Secondary | ICD-10-CM | POA: Diagnosis not present

## 2023-12-20 ENCOUNTER — Ambulatory Visit (HOSPITAL_COMMUNITY)
Admission: RE | Admit: 2023-12-20 | Discharge: 2023-12-20 | Disposition: A | Discharge status: Home / Self Care | Payer: Medicare Other | Source: Ambulatory Visit | Attending: Internal Medicine | Admitting: Internal Medicine

## 2023-12-20 DIAGNOSIS — R109 Unspecified abdominal pain: Secondary | ICD-10-CM | POA: Diagnosis not present

## 2023-12-20 DIAGNOSIS — K5730 Diverticulosis of large intestine without perforation or abscess without bleeding: Secondary | ICD-10-CM | POA: Diagnosis not present

## 2023-12-20 MED ORDER — IOHEXOL 300 MG/ML  SOLN
80.0000 mL | Freq: Once | INTRAMUSCULAR | Status: AC | PRN
  Administered 2023-12-20: 80 mL via INTRAVENOUS

## 2024-01-12 ENCOUNTER — Other Ambulatory Visit: Payer: Self-pay | Admitting: Cardiology

## 2024-01-21 ENCOUNTER — Ambulatory Visit: Payer: Medicare Other | Attending: Cardiology | Admitting: Cardiology

## 2024-01-21 ENCOUNTER — Encounter: Payer: Self-pay | Admitting: Cardiology

## 2024-01-21 VITALS — BP 166/68 | HR 68 | Ht 62.0 in | Wt 136.6 lb

## 2024-01-21 DIAGNOSIS — I2510 Atherosclerotic heart disease of native coronary artery without angina pectoris: Secondary | ICD-10-CM

## 2024-01-21 DIAGNOSIS — I2583 Coronary atherosclerosis due to lipid rich plaque: Secondary | ICD-10-CM

## 2024-01-21 DIAGNOSIS — Z952 Presence of prosthetic heart valve: Secondary | ICD-10-CM

## 2024-01-21 NOTE — Patient Instructions (Signed)
Medication Instructions:  The current medical regimen is effective;  continue present plan and medications.  *If you need a refill on your cardiac medications before your next appointment, please call your pharmacy*  Follow-Up: At [facility], you and your health needs are our priority.  As part of our continuing mission to provide you with exceptional heart care, we have created designated Provider Care Teams.  These Care Teams include your primary Cardiologist (physician) and Advanced Practice Providers (APPs -  Physician Assistants and Nurse Practitioners) who all work together to provide you with the care you need, when you need it.  We recommend signing up for the patient portal called "MyChart".  Sign up information is provided on this After Visit Summary.  MyChart is used to connect with patients for Virtual Visits (Telemedicine).  Patients are able to view lab/test results, encounter notes, upcoming appointments, etc.  Non-urgent messages can be sent to your provider as well.   To learn more about what you can do with MyChart, go to [email or web address].    Your next appointment:   6 month(s)  Provider:   Candee Furbish, MD

## 2024-01-21 NOTE — Progress Notes (Signed)
 Cardiology Office Note:  .   Date:  01/21/2024  ID:  KIRRALEE DIGIANDOMENICO, DOB December 11, 1930, MRN [id number] PCP: Merl Star, MD  [facility] Providers Cardiologist:  Dorothye Gathers, MD    History of Present Illness: .   Shanoa Ferreri Grandpre is a 89 y.o. female Discussed with the use of AI scribe   History of Present Illness   Tiny Bartos Bayles is a 89 year old female who presents for follow-up after transcatheter aortic valve replacement (TAVR).  She underwent a transcatheter aortic valve replacement (TAVR) with a 23 mm Edward Sapien 3 Ultraresilia THV via transfemoral approach on October 10, 2022. Postoperatively, she experienced gastrointestinal bleeding, which has since resolved. She feels well overall, with no current issues related to the TAVR. An echocardiogram on October 10, 2023, showed normal pump function and a well-seated TAVR valve with minimal leak around the valve. No chest pain or significant shortness of breath.  She experiences occasional episodes of presyncope, occurring once every couple of weeks, lasting about two seconds. She describes it as a 'strange vroom' sensation, feeling like she misses a heartbeat. These episodes have not occurred in the past month. A heart monitor placed on November 01, 2023, showed normal rhythm with some extra beats, which she describes as feeling like skipped beats.  She takes spironolactone  and furosemide  for fluid management. She has difficulty tolerating 40 mg of furosemide  three times a week due to it causing her to feel drained and increasing her nighttime urination. She prefers taking 20 mg instead. She also takes a potassium supplement to prevent leg cramps, with her potassium level last recorded at 5.1.  Her medical history includes diabetes, hypertension, hyperlipidemia, and pulmonary nodules. She also underwent a hip replacement and is followed by a pulmonary team for pulmonary nodules.     Leeta Puls grandson used to take piano from my  mother. 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     Studies Reviewed: .        Results   LABS Creatinine: 0.93 Potassium: 5.1  DIAGNOSTIC Heart monitor: Normal rhythm with occasional ectopic beats (11/01/2023) Echocardiogram: Normal ejection fraction, well-seated TAVR valve with minimal regurgitation, good gradient (10/10/2023)     Risk Assessment/Calculations:           Physical Exam:   VS:  BP (!) 166/68   Pulse 68   Ht 5\' 2"  (1.575 m)   Wt 136 lb 9.6 oz (62 kg)   LMP  (LMP Unknown)   SpO2 98%   BMI 24.98 kg/m    Wt Readings from Last 3 Encounters:  01/21/24 136 lb 9.6 oz (62 kg)  10/10/23 135 lb 3.2 oz (61.3 kg)  06/12/23 136 lb 11 oz (62 kg)    GEN: Well nourished, well developed in no acute distress NECK: No JVD; No carotid bruits, kyphosis, walker CARDIAC: RRR, no murmurs, no rubs, no gallops RESPIRATORY:  Clear to auscultation without rales, wheezing or rhonchi  ABDOMEN: Soft, non-tender, non-distended EXTREMITIES:  No edema; No deformity   ASSESSMENT AND PLAN: .    Assessment and Plan    Follow-up TAVR 89 year old female presenting for follow-up after TAVR on 10/10/2022. Postoperative course included resolved GI bleeding. Echocardiogram on 10/10/2023 showed normal pump function, well-seated valve with minimal leak. Patient reports intermittent skipped beats, confirmed by heart monitor showing normal rhythm with occasional extrasystoles. No significant pauses, chest pain, or dyspnea. Discussed that the mild valve leak is common and not expected to worsen. Encouraged to maintain physical  activity as tolerated. - Continue current medications including aspirin  81 mg daily - Schedule follow-up in 6 months  Hypertension Chronic condition managed with current medications. Patient reports difficulty tolerating 40 mg furosemide  three times a week due to nocturia and fatigue, prefers 20 mg dose. Discussed that the lower dose is acceptable given her symptoms and  that mild swelling is not harmful but can be annoying. - Continue furosemide  20 mg as tolerated - Monitor blood pressure and adjust medications as needed  Diabetes Mellitus Chronic condition. No specific issues discussed during this visit. - Continue current diabetes management regimen  Hyperlipidemia Chronic condition. No specific issues discussed during this visit. - Continue current lipid-lowering therapy  Pulmonary Nodules Patient followed by pulmonary team for pulmonary nodules. No new symptoms or concerns discussed. - Continue follow-up with pulmonary team  General Health Maintenance Discussed importance of physical activity for overall health. - Encourage regular physical activity - Continue routine health maintenance and screenings.              Signed, Dorothye Gathers, MD

## 2024-01-22 ENCOUNTER — Other Ambulatory Visit: Payer: Self-pay | Admitting: Cardiology

## 2024-01-31 ENCOUNTER — Telehealth: Payer: Self-pay | Admitting: Pharmacist

## 2024-01-31 MED ORDER — HYDRALAZINE HCL 25 MG PO TABS: 25.0000 mg | ORAL_TABLET | Freq: Three times a day (TID) | ORAL | 3 refills | Status: AC | PRN

## 2024-01-31 NOTE — Telephone Encounter (Signed)
Patient called this morning and left a message on my voicemail stating last night her face got really warm and she checked her blood pressure and it was 183/81.  She could not find any of her hydralazine so she took an isosorbide and it came down.  Today her blood pressure was 157/70.  Asking what she should take if her blood pressure goes up again.  Advised I will call her in some hydralazine as needed.  Blood pressure generally well-controlled otherwise.

## 2024-02-07 DIAGNOSIS — Z96641 Presence of right artificial hip joint: Secondary | ICD-10-CM | POA: Diagnosis not present

## 2024-02-12 DIAGNOSIS — R040 Epistaxis: Secondary | ICD-10-CM | POA: Diagnosis not present

## 2024-02-18 DIAGNOSIS — H6121 Impacted cerumen, right ear: Secondary | ICD-10-CM | POA: Diagnosis not present

## 2024-02-18 DIAGNOSIS — R040 Epistaxis: Secondary | ICD-10-CM | POA: Diagnosis not present

## 2024-03-10 DIAGNOSIS — E1169 Type 2 diabetes mellitus with other specified complication: Secondary | ICD-10-CM | POA: Diagnosis not present

## 2024-03-10 DIAGNOSIS — I10 Essential (primary) hypertension: Secondary | ICD-10-CM | POA: Diagnosis not present

## 2024-03-10 DIAGNOSIS — M810 Age-related osteoporosis without current pathological fracture: Secondary | ICD-10-CM | POA: Diagnosis not present

## 2024-03-10 DIAGNOSIS — I5032 Chronic diastolic (congestive) heart failure: Secondary | ICD-10-CM | POA: Diagnosis not present

## 2024-03-28 ENCOUNTER — Other Ambulatory Visit: Payer: Self-pay | Admitting: Cardiology

## 2024-04-09 DIAGNOSIS — I10 Essential (primary) hypertension: Secondary | ICD-10-CM | POA: Diagnosis not present

## 2024-04-09 DIAGNOSIS — M810 Age-related osteoporosis without current pathological fracture: Secondary | ICD-10-CM | POA: Diagnosis not present

## 2024-04-09 DIAGNOSIS — I5032 Chronic diastolic (congestive) heart failure: Secondary | ICD-10-CM | POA: Diagnosis not present

## 2024-04-09 DIAGNOSIS — E1169 Type 2 diabetes mellitus with other specified complication: Secondary | ICD-10-CM | POA: Diagnosis not present

## 2024-04-22 DIAGNOSIS — Z01419 Encounter for gynecological examination (general) (routine) without abnormal findings: Secondary | ICD-10-CM | POA: Diagnosis not present

## 2024-04-22 DIAGNOSIS — Z853 Personal history of malignant neoplasm of breast: Secondary | ICD-10-CM | POA: Diagnosis not present

## 2024-04-22 DIAGNOSIS — Z6825 Body mass index (BMI) 25.0-25.9, adult: Secondary | ICD-10-CM | POA: Diagnosis not present

## 2024-04-28 ENCOUNTER — Other Ambulatory Visit: Payer: Self-pay | Admitting: Cardiology

## 2024-05-08 DIAGNOSIS — Z1331 Encounter for screening for depression: Secondary | ICD-10-CM | POA: Diagnosis not present

## 2024-05-08 DIAGNOSIS — E7800 Pure hypercholesterolemia, unspecified: Secondary | ICD-10-CM | POA: Diagnosis not present

## 2024-05-08 DIAGNOSIS — Z952 Presence of prosthetic heart valve: Secondary | ICD-10-CM | POA: Diagnosis not present

## 2024-05-08 DIAGNOSIS — E1169 Type 2 diabetes mellitus with other specified complication: Secondary | ICD-10-CM | POA: Diagnosis not present

## 2024-05-08 DIAGNOSIS — G4700 Insomnia, unspecified: Secondary | ICD-10-CM | POA: Diagnosis not present

## 2024-05-08 DIAGNOSIS — I10 Essential (primary) hypertension: Secondary | ICD-10-CM | POA: Diagnosis not present

## 2024-05-08 DIAGNOSIS — I2510 Atherosclerotic heart disease of native coronary artery without angina pectoris: Secondary | ICD-10-CM | POA: Diagnosis not present

## 2024-05-08 DIAGNOSIS — Z0000 Encounter for general adult medical examination without abnormal findings: Secondary | ICD-10-CM | POA: Diagnosis not present

## 2024-05-10 DIAGNOSIS — M810 Age-related osteoporosis without current pathological fracture: Secondary | ICD-10-CM | POA: Diagnosis not present

## 2024-05-10 DIAGNOSIS — I10 Essential (primary) hypertension: Secondary | ICD-10-CM | POA: Diagnosis not present

## 2024-05-10 DIAGNOSIS — E1169 Type 2 diabetes mellitus with other specified complication: Secondary | ICD-10-CM | POA: Diagnosis not present

## 2024-05-10 DIAGNOSIS — I5032 Chronic diastolic (congestive) heart failure: Secondary | ICD-10-CM | POA: Diagnosis not present

## 2024-06-09 DIAGNOSIS — I10 Essential (primary) hypertension: Secondary | ICD-10-CM | POA: Diagnosis not present

## 2024-06-09 DIAGNOSIS — I5032 Chronic diastolic (congestive) heart failure: Secondary | ICD-10-CM | POA: Diagnosis not present

## 2024-06-09 DIAGNOSIS — E1169 Type 2 diabetes mellitus with other specified complication: Secondary | ICD-10-CM | POA: Diagnosis not present

## 2024-06-09 DIAGNOSIS — M810 Age-related osteoporosis without current pathological fracture: Secondary | ICD-10-CM | POA: Diagnosis not present

## 2024-06-19 DIAGNOSIS — H3530 Unspecified macular degeneration: Secondary | ICD-10-CM | POA: Diagnosis not present

## 2024-06-19 DIAGNOSIS — Z961 Presence of intraocular lens: Secondary | ICD-10-CM | POA: Diagnosis not present

## 2024-06-19 DIAGNOSIS — H353132 Nonexudative age-related macular degeneration, bilateral, intermediate dry stage: Secondary | ICD-10-CM | POA: Diagnosis not present

## 2024-06-19 DIAGNOSIS — H53143 Visual discomfort, bilateral: Secondary | ICD-10-CM | POA: Diagnosis not present

## 2024-07-09 DIAGNOSIS — M654 Radial styloid tenosynovitis [de Quervain]: Secondary | ICD-10-CM | POA: Diagnosis not present

## 2024-07-09 DIAGNOSIS — M1811 Unilateral primary osteoarthritis of first carpometacarpal joint, right hand: Secondary | ICD-10-CM | POA: Diagnosis not present

## 2024-07-10 DIAGNOSIS — E1169 Type 2 diabetes mellitus with other specified complication: Secondary | ICD-10-CM | POA: Diagnosis not present

## 2024-07-10 DIAGNOSIS — M810 Age-related osteoporosis without current pathological fracture: Secondary | ICD-10-CM | POA: Diagnosis not present

## 2024-07-10 DIAGNOSIS — I10 Essential (primary) hypertension: Secondary | ICD-10-CM | POA: Diagnosis not present

## 2024-07-10 DIAGNOSIS — I5032 Chronic diastolic (congestive) heart failure: Secondary | ICD-10-CM | POA: Diagnosis not present

## 2024-07-28 ENCOUNTER — Other Ambulatory Visit: Payer: Self-pay | Admitting: Cardiology

## 2024-08-05 ENCOUNTER — Ambulatory Visit: Admitting: Cardiology

## 2024-08-10 DIAGNOSIS — I5032 Chronic diastolic (congestive) heart failure: Secondary | ICD-10-CM | POA: Diagnosis not present

## 2024-08-10 DIAGNOSIS — E1169 Type 2 diabetes mellitus with other specified complication: Secondary | ICD-10-CM | POA: Diagnosis not present

## 2024-08-10 DIAGNOSIS — I10 Essential (primary) hypertension: Secondary | ICD-10-CM | POA: Diagnosis not present

## 2024-08-10 DIAGNOSIS — M810 Age-related osteoporosis without current pathological fracture: Secondary | ICD-10-CM | POA: Diagnosis not present

## 2024-08-19 ENCOUNTER — Encounter: Payer: Self-pay | Admitting: Cardiology

## 2024-08-19 ENCOUNTER — Ambulatory Visit: Attending: Cardiology | Admitting: Cardiology

## 2024-08-19 VITALS — BP 138/60 | HR 65 | Ht 62.0 in | Wt 135.0 lb

## 2024-08-19 DIAGNOSIS — Z952 Presence of prosthetic heart valve: Secondary | ICD-10-CM | POA: Insufficient documentation

## 2024-08-19 DIAGNOSIS — I350 Nonrheumatic aortic (valve) stenosis: Secondary | ICD-10-CM | POA: Insufficient documentation

## 2024-08-19 DIAGNOSIS — I2510 Atherosclerotic heart disease of native coronary artery without angina pectoris: Secondary | ICD-10-CM | POA: Insufficient documentation

## 2024-08-19 DIAGNOSIS — I2583 Coronary atherosclerosis due to lipid rich plaque: Secondary | ICD-10-CM | POA: Diagnosis not present

## 2024-08-19 DIAGNOSIS — N1831 Chronic kidney disease, stage 3a: Secondary | ICD-10-CM | POA: Insufficient documentation

## 2024-08-19 NOTE — Patient Instructions (Signed)
 Medication Instructions:  The current medical regimen is effective;  continue present plan and medications.  *If you need a refill on your cardiac medications before your next appointment, please call your pharmacy*  Follow-Up: At [facility], you and your health needs are our priority.  As part of our continuing mission to provide you with exceptional heart care, our providers are all part of one team.  This team includes your primary Cardiologist (physician) and Advanced Practice Providers or APPs (Physician Assistants and Nurse Practitioners) who all work together to provide you with the care you need, when you need it.  Your next appointment:   6 month(s)  Provider:   Dorothye Gathers, MD    We recommend signing up for the patient portal called "MyChart".  Sign up information is provided on this After Visit Summary.  MyChart is used to connect with patients for Virtual Visits (Telemedicine).  Patients are able to view lab/test results, encounter notes, upcoming appointments, etc.  Non-urgent messages can be sent to your provider as well.   To learn more about what you can do with MyChart, go to [email or web address].

## 2024-08-19 NOTE — Progress Notes (Signed)
 Cardiology Office Note:  .   Date:  08/19/2024  ID:  Barbara Russell, DOB January 14, 1930, MRN [id number] PCP: Rexanne Ingle, MD  [facility] Providers Cardiologist:  Oneil Parchment, MD     History of Present Illness: .   Barbara Russell is a 89 y.o. female Discussed the use of AI scribe software for clinical note transcription with the patient, who gave verbal consent to proceed.  History of Present Illness Barbara Russell is a 89 year old female who presents for a follow-up visit after transcatheter aortic valve replacement (TAVR).  She underwent TAVR with a 23 mm Edward Sapien 3 valve via transfemoral approach on October 10, 2022. Postoperatively, she experienced gastrointestinal bleeding, which has since resolved. She reports no significant chest pain or shortness of breath, although she experienced a transient 'heavy feeling' in her chest this morning that resolved quickly.  She mentions occasional issues with her blood pressure, which tends to be around 130/80 mmHg, with the diastolic pressure running lower than normal. She has an aide who checks her blood pressure regularly.  She has a history of arthritis and reports muscular back pain for which she previously received a muscle relaxer prescription. She finds the muscle relaxer more effective than Tylenol  and uses it sparingly due to its sedative effects.  She resides in a senior living facility and wants to return to her previous independent living situation, but her children are concerned about her safety. She remains active, walking daily around her building, and participates in gardening activities.      Studies Reviewed: SABRA   EKG Interpretation Date/Time:  Tuesday August 19 2024 10:33:50 EDT Ventricular Rate:  65 PR Interval:  188 QRS Duration:  78 QT Interval:  398 QTC Calculation: 413 R Axis:   -46  Text Interpretation: Normal sinus rhythm Left axis deviation Low voltage QRS Cannot rule out Anteroseptal infarct  , age undetermined When compared with ECG of 12-Jun-2023 16:10, No significant change since last tracing Confirmed by Parchment Oneil ([id number]) on 08/19/2024 10:38:58 AM    Results LABS Hemoglobin A1c: 6.0% Hemoglobin: 11.6 g/dL Creatinine: 0.9 mg/dL ALT: 27 U/L LDL: 859 mg/dL  DIAGNOSTIC Echocardiogram: Well-seated valve, functioning well, normal cardiac function Risk Assessment/Calculations:            Physical Exam:   VS:  BP 138/60   Pulse 65   Ht 5' 2 (1.575 m)   Wt 135 lb (61.2 kg)   LMP  (LMP Unknown)   SpO2 95%   BMI 24.69 kg/m    Wt Readings from Last 3 Encounters:  08/19/24 135 lb (61.2 kg)  01/21/24 136 lb 9.6 oz (62 kg)  10/10/23 135 lb 3.2 oz (61.3 kg)    GEN: Well nourished, well developed in no acute distress NECK: No JVD; No carotid bruits CARDIAC: RRR, 2/6 systolic murmur, no rubs, no gallops RESPIRATORY:  Clear to auscultation without rales, wheezing or rhonchi  ABDOMEN: Soft, non-tender, non-distended EXTREMITIES:  No edema; No deformity   ASSESSMENT AND PLAN: .    Assessment and Plan Assessment & Plan Status post transcatheter aortic valve replacement (TAVR) with well-functioning prosthetic aortic valve The prosthetic aortic valve is well-seated and functioning properly as confirmed by postoperative echocardiograms. No significant complications post-TAVR. She reports feeling well with no major cardiac symptoms. A brief episode of chest pain and a heavy feeling resolved spontaneously and are not concerning at this time. - Continue routine follow-up. - Report any new or worsening symptoms immediately.  Atherosclerotic heart disease of native coronary artery without angina No current angina or significant cardiac symptoms. A brief episode of chest pain and a heavy feeling resolved spontaneously and are not concerning at this time. - Continue current management and lifestyle modifications. - Monitor for any new symptoms.  Hypertension Blood pressure is  generally well-controlled with readings around 130/80 mmHg. Diastolic pressure tends to run lower than normal, which is acceptable. - Continue current antihypertensive regimen. - Regular monitoring of blood pressure by aide.         Dispo: 6 mth  Signed, Oneil Parchment, MD

## 2024-09-09 DIAGNOSIS — I10 Essential (primary) hypertension: Secondary | ICD-10-CM | POA: Diagnosis not present

## 2024-09-09 DIAGNOSIS — M533 Sacrococcygeal disorders, not elsewhere classified: Secondary | ICD-10-CM | POA: Diagnosis not present

## 2024-09-09 DIAGNOSIS — I5032 Chronic diastolic (congestive) heart failure: Secondary | ICD-10-CM | POA: Diagnosis not present

## 2024-09-09 DIAGNOSIS — M791 Myalgia, unspecified site: Secondary | ICD-10-CM | POA: Diagnosis not present

## 2024-09-09 DIAGNOSIS — M545 Low back pain, unspecified: Secondary | ICD-10-CM | POA: Diagnosis not present

## 2024-09-09 DIAGNOSIS — M810 Age-related osteoporosis without current pathological fracture: Secondary | ICD-10-CM | POA: Diagnosis not present

## 2024-09-09 DIAGNOSIS — E1169 Type 2 diabetes mellitus with other specified complication: Secondary | ICD-10-CM | POA: Diagnosis not present

## 2024-09-14 DIAGNOSIS — Z23 Encounter for immunization: Secondary | ICD-10-CM | POA: Diagnosis not present

## 2024-09-19 ENCOUNTER — Other Ambulatory Visit: Payer: Self-pay | Admitting: Obstetrics and Gynecology

## 2024-09-19 DIAGNOSIS — Z1231 Encounter for screening mammogram for malignant neoplasm of breast: Secondary | ICD-10-CM

## 2024-10-10 DIAGNOSIS — E1169 Type 2 diabetes mellitus with other specified complication: Secondary | ICD-10-CM | POA: Diagnosis not present

## 2024-10-10 DIAGNOSIS — M810 Age-related osteoporosis without current pathological fracture: Secondary | ICD-10-CM | POA: Diagnosis not present

## 2024-10-10 DIAGNOSIS — I5032 Chronic diastolic (congestive) heart failure: Secondary | ICD-10-CM | POA: Diagnosis not present

## 2024-10-10 DIAGNOSIS — I10 Essential (primary) hypertension: Secondary | ICD-10-CM | POA: Diagnosis not present

## 2024-10-15 ENCOUNTER — Ambulatory Visit

## 2024-10-23 DIAGNOSIS — M533 Sacrococcygeal disorders, not elsewhere classified: Secondary | ICD-10-CM | POA: Diagnosis not present

## 2024-10-23 DIAGNOSIS — M545 Low back pain, unspecified: Secondary | ICD-10-CM | POA: Diagnosis not present

## 2024-10-23 DIAGNOSIS — M791 Myalgia, unspecified site: Secondary | ICD-10-CM | POA: Diagnosis not present

## 2024-10-31 DIAGNOSIS — E1169 Type 2 diabetes mellitus with other specified complication: Secondary | ICD-10-CM | POA: Diagnosis not present

## 2024-10-31 DIAGNOSIS — I10 Essential (primary) hypertension: Secondary | ICD-10-CM | POA: Diagnosis not present

## 2024-11-09 DIAGNOSIS — M810 Age-related osteoporosis without current pathological fracture: Secondary | ICD-10-CM | POA: Diagnosis not present

## 2024-11-09 DIAGNOSIS — E1169 Type 2 diabetes mellitus with other specified complication: Secondary | ICD-10-CM | POA: Diagnosis not present

## 2024-11-09 DIAGNOSIS — I10 Essential (primary) hypertension: Secondary | ICD-10-CM | POA: Diagnosis not present

## 2024-11-09 DIAGNOSIS — I5032 Chronic diastolic (congestive) heart failure: Secondary | ICD-10-CM | POA: Diagnosis not present

## 2024-11-14 ENCOUNTER — Ambulatory Visit

## 2024-12-16 ENCOUNTER — Ambulatory Visit
Admission: RE | Admit: 2024-12-16 | Discharge: 2024-12-16 | Disposition: A | Source: Ambulatory Visit | Attending: Obstetrics and Gynecology | Admitting: Obstetrics and Gynecology

## 2024-12-16 DIAGNOSIS — Z1231 Encounter for screening mammogram for malignant neoplasm of breast: Secondary | ICD-10-CM

## 2025-04-10 ENCOUNTER — Ambulatory Visit: Admitting: Cardiology
